# Patient Record
Sex: Male | Born: 1935 | Race: White | Hispanic: No | Marital: Married
Health system: Midwestern US, Community
[De-identification: ages and names within clinical notes are randomized; demographics above are authoritative.]

## PROBLEM LIST (undated history)

## (undated) DIAGNOSIS — I480 Paroxysmal atrial fibrillation: Secondary | ICD-10-CM

## (undated) DIAGNOSIS — E785 Hyperlipidemia, unspecified: Secondary | ICD-10-CM

## (undated) DIAGNOSIS — T148XXA Other injury of unspecified body region, initial encounter: Secondary | ICD-10-CM

## (undated) DIAGNOSIS — I270 Primary pulmonary hypertension: Secondary | ICD-10-CM

## (undated) DIAGNOSIS — M549 Dorsalgia, unspecified: Secondary | ICD-10-CM

## (undated) DIAGNOSIS — I10 Essential (primary) hypertension: Secondary | ICD-10-CM

## (undated) DIAGNOSIS — R935 Abnormal findings on diagnostic imaging of other abdominal regions, including retroperitoneum: Secondary | ICD-10-CM

## (undated) DIAGNOSIS — N401 Enlarged prostate with lower urinary tract symptoms: Secondary | ICD-10-CM

## (undated) DIAGNOSIS — N400 Enlarged prostate without lower urinary tract symptoms: Secondary | ICD-10-CM

## (undated) DIAGNOSIS — R0602 Shortness of breath: Secondary | ICD-10-CM

## (undated) DIAGNOSIS — I4891 Unspecified atrial fibrillation: Secondary | ICD-10-CM

## (undated) HISTORY — PX: TONSILLECTOMY AND ADENOIDECTOMY: SUR1326

## (undated) HISTORY — DX: Essential (primary) hypertension: I10

## (undated) HISTORY — PX: KNEE SURGERY: SHX244

## (undated) HISTORY — DX: Unspecified atrial fibrillation: I48.91

## (undated) HISTORY — PX: UMBILICAL HERNIA REPAIR: SHX196

---

## 2007-07-07 ENCOUNTER — Ambulatory Visit

## 2007-07-07 LAB — METABOLIC PANEL, COMPREHENSIVE
A-G Ratio: 1.4 (ref 1.1–2.2)
ALT (SGPT): 33 U/L (ref 30–65)
AST (SGOT): 26 U/L (ref 15–37)
Albumin: 3.8 g/dL (ref 3.5–5.0)
Alk. phosphatase: 127 U/L (ref 50–136)
Anion gap: 9 (ref 5–15)
BUN/Creatinine ratio: 15 (ref 12–20)
BUN: 16 MG/DL (ref 6–20)
Bilirubin, total: 0.5 MG/DL (ref 0–1.0)
CO2: 30 MMOL/L (ref 21–32)
Calcium: 8.8 MG/DL (ref 8.5–10.1)
Chloride: 103 MMOL/L (ref 97–108)
Creatinine: 1.1 MG/DL (ref 0.6–1.3)
GFR est AA: >60 mL/min/{1.73_m2} (ref >60)
GFR est non-AA: >60 mL/min/{1.73_m2} (ref >60)
Globulin: 2.8 g/dL (ref 2.0–4.0)
Glucose: 83 MG/DL (ref 75–110)
Potassium: 4.4 MMOL/L (ref 3.5–5.1)
Protein, total: 6.6 g/dL (ref 6.4–8.2)
Sodium: 142 MMOL/L (ref 136–145)

## 2007-07-07 LAB — URINALYSIS W/O MICRO
Bilirubin: NEGATIVE
Blood: NEGATIVE
Glucose: NEGATIVE MG/DL
Ketone: NEGATIVE MG/DL
Leukocyte Esterase: NEGATIVE
Nitrites: NEGATIVE
Protein: NEGATIVE MG/DL
Specific gravity: 1.016 (ref 1.003–1.030)
Urobilinogen: 1 EU/DL (ref 0.2–1.0)
pH (UA): 6.5 (ref 5.0–8.0)

## 2007-07-07 MED ORDER — FUROSEMIDE 20 MG TAB
20 mg | ORAL_TABLET | Freq: Every day | ORAL | Status: DC
Start: 2007-07-07 — End: 2011-10-06

## 2007-07-07 NOTE — Progress Notes (Signed)
HISTORY OF PRESENT ILLNESS  Cory Dalton is a 74 y.o. male.  Knee Injury   This is a recurrent problem. The current episode started more than 1 week ago. The problem occurs every several days. The problem has not changed since onset. The pain is present in the left knee. The pain quality is described as intermittent. The pain is at a severity of 4/10. The symptoms are worsened by activity. There has been no history of trauma.     Edema-notes worse when he skips his lasix but often skips it due to urinary frequency-no dysuria      No recent trouble with a fib-coumadin followed by cardiology    REVIEW OF SYSTEMS  Review of Systems   Constitutional: Negative for fever, chills and weight loss.   Respiratory: Negative for cough, shortness of breath and wheezing.    Cardiovascular: Positive for leg swelling. Negative for chest pain, palpitations, orthopnea and PND.   Gastrointestinal: Negative for heartburn and nausea.   Musculoskeletal: Positive for joint pain. Negative for myalgias.   Neurological: Negative for dizziness and headaches.       PHYSICAL EXAM  Physical Exam   Nursing note and vitals reviewed.  Constitutional: He is oriented. He appears well-developed and well-nourished.   HENT:   Head: Normocephalic and atraumatic.   Neck: Normal range of motion. Neck supple. Carotid bruit is not present. No thyromegaly present.   Cardiovascular: Normal rate, regular rhythm, normal heart sounds and intact distal pulses.    Pulmonary/Chest: Effort normal and breath sounds normal. No respiratory distress. He has no wheezes. He has no rales.   Musculoskeletal: He exhibits no edema.        Left knee with medial aspect joint swelling nontender and not red   Neurological: He is alert and oriented.   Psychiatric: He has a normal mood and affect. His behavior is normal.       ASSESSMENT and PLAN  Cory Dalton was seen today for follow-up.    Diagnoses and associated orders for this visit:    - Paroxysmal atrial fibrillation   - Metabolic panel, comprehensive -- Future    - Bph  - No associated orders    - Resting tremor  - No associated orders    - Urinary frequency  - Urinalysis -- Future    - Edema  - Furosemide 20 mg tab -- take 1 Tab by mouth daily.    Decrease lasix form 40 to 20 mg qd to see if better tolerated

## 2007-07-15 NOTE — Progress Notes (Signed)
Quick Note:    .not  ______

## 2011-10-06 LAB — AMB POC PT/INR
INR POC: 2.2
Prothrombin time (POC): 25.9 s

## 2011-10-06 NOTE — Patient Instructions (Signed)
MyChart Activation    Thank you for enrolling in MyChart. Please follow the instructions below to securely access your online medical record. MyChart allows you to send messages to your doctor, view your test results, renew your prescriptions, schedule appointments, and more.    How Do I Sign Up?    1. In your internet browser, go to [email or web address].  2. Click on the First Time User? Click Here link in the Sign In box. You will see the New Member Sign Up page.  3. Enter your MyChart Access Code exactly as it appears below. You will not need to use this code after you???ve completed the sign-up process. If you do not sign up before the expiration date, you must request a new code.    MyChart Access Code: [email or web address]  Expires: 01/04/2012  1:50 PM     4. Enter the last four digits of your Social Security Number (xxxx) and Date of Birth (mm/dd/yyyy) as indicated and click Submit. You will be taken to the next sign-up page.  5. Create a MyChart ID. This will be your MyChart login ID and cannot be changed, so think of one that is secure and easy to remember.  6. Create a MyChart password. You can change your password at any time.  7. Enter your Password Reset Question and Answer. This can be used at a later time if you forget your password.   8. Enter your e-mail address. You will receive e-mail notification when new information is available in MyChart.  9. Click Sign Up. You can now view your medical record.    Additional Information    If you have questions, please call [phone].  Remember, MyChart is NOT to be used for urgent needs. For medical emergencies, dial 911.

## 2011-10-06 NOTE — Progress Notes (Signed)
CARDIOLOGY OFFICE NOTE    Analyse Angst A. Apoorva Bugay, MD, [facility]    [address], [address], [place], [place] [address]  Phone [phone]; Fax [phone]  Mobile [phone]   Voice Mail [phone]    No admission date for patient encounter.  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 male I am seeing for hypertension, hyperlipidemia and Atrial Fibrillation. Symptoms include: chest pain  Cardiac risk factors: male gender, hypertension, dyslipidemia..I have personally obtained the history from the patient.I have no sen Cory Dalton since 2009. [place] apparently [place] and [place] now back. He appears to be in chronic atrial fibrillation now. He is off the amiodarone. He is doing well without chest pain or shortness of breath.he will feel SOB at times waking up stairs.       No Known Allergies      Past Medical History   Diagnosis Date   ??? Paroxysmal atrial fibrillation 07/04/2007   ??? BPH 07/04/2007   ??? Resting tremor 07/04/2007   ??? BCC (basal cell carcinoma of skin) 07/04/2007   ??? Atrial fibrillation         Past Surgical History   Procedure Date   ??? Hx hernia repair 04/2010   ??? Hx cataract removal        Home Medications:  Current Outpatient Prescriptions   Medication Sig   ??? Hydrochlorothiazide 12.5 mg tablet Take 12.5 mg by mouth daily.   ??? lovastatin (MEVACOR) 20 mg tablet Take 20 mg by mouth nightly.   ??? lisinopril (PRINIVIL, ZESTRIL) 40 mg tablet Take 40 mg by mouth daily.   ??? metoprolol-XL (TOPROL XL) 50 mg XL tablet Take  by mouth daily.   ??? amLODIPine (NORVASC) 5 mg tablet Take 5 mg by mouth daily.   ??? FLOMAX 0.4 mg Cp24 take 0.4 mg by mouth daily.   ??? WARFARIN PO take  by mouth. 1.25 mg 5 days 2.5 mg 2 days    ??? ASPIRIN 81 mg Tab take  by mouth.   ??? DIGOXIN 0.125 mg tablet take 125 mcg by mouth daily.   ??? AVODART 0.5 mg Cap take 0.5 mg by mouth daily.             Objective:   Laboratory and Imaging have been  reviewed by me and are notable for    ECG:(10/06/11) atrial fibrillation, rate 68        Cardiac work up to date:            PROCEDURES                                           DATE       1.  Echocardiogram                 (04/13/07)  EF 55-60%, BAE, Mild TR with       2. Cholesterol profile                                    (pending)  Cholesterol =        HDL  =        LDL  =        Triglycerides  =         Social History:  History   Substance Use Topics   ??? Smoking status: Never Smoker    ??? Smokeless tobacco: Never Used   ??? Alcohol Use: 1.5 oz/week     3 Glasses of wine per week      3 er week       Family History:  Family History   Problem Relation Age of Onset   ??? Hypertension Mother    ??? Cancer Father      ?prostate cancer, bone cancer       Review of Symptoms:  A comprehensive review of systems ,particularly related to cardiovascular system, was negative except for that written in the HPI.    Physical Exam:      BP 130/80   Pulse 60   Ht 5\' 11"  (1.803 m)   Wt 191 lb (86.637 kg)   BMI 26.64 kg/m2   SpO2 98%  General Appearance:  Well developed, well nourished,alert and oriented x 3, and individual in no acute distress.   Ears/Nose/Mouth/Throat:   Hearing grossly normal.     Neck: Supple no JVD or bruits   Chest:   Lungs clear to auscultation bilaterally.   Cardiovascular:  Irregular rate and rhythm   Abdomen:      Extremities: 1+ ankle edema bilaterally.    Skin: Warm and dry.       Medication change on this office visit: none    Assessment:   ?? Atrial fibirrlation  ?? Edema possibly related to norvasc he was started on  ?? dyslipidemia   PLAN/discussion:   ?? He has been doing well with his afib. We talked about alternative agents for anticoagulation and he wishes to do some research. He will have his FLP done.we will now be following his INR.   ?? I have extensively reviewed all testing with the patient.    I have discussed the diagnosis with the patient and the  intended plan as seen in the above orders.  Questions were answered concerning future plans.  I have discussed medication side effects and warnings with the patient as well.    Cory Dalton is in agreement to the plan listed above and wishes to proceed.     he  was instructed not to smoke, eat heart healthy diet  and to exercise.     Thank you for this consult.    Rushie Chestnut, MD

## 2011-10-06 NOTE — Progress Notes (Signed)
See inr results

## 2011-10-12 MED ORDER — WARFARIN 2.5 MG TAB
2.5 mg | ORAL_TABLET | Freq: Every day | ORAL | Status: DC
Start: 2011-10-12 — End: 2012-11-26

## 2011-10-12 MED ORDER — AMLODIPINE 5 MG TAB
5 mg | ORAL_TABLET | Freq: Every day | ORAL | Status: DC
Start: 2011-10-12 — End: 2011-12-06

## 2011-10-12 MED ORDER — TAMSULOSIN SR 0.4 MG 24 HR CAP
0.4 mg | ORAL_CAPSULE | Freq: Every day | ORAL | Status: DC
Start: 2011-10-12 — End: 2012-10-09

## 2011-10-12 NOTE — Patient Instructions (Signed)
Exercise for 30 minutes daily     Go to www.EATRIGHT.org for information on healthy eating    Consider seeing Nutritionist at [facility]  #[phone] or #[phone]    I suggest a complete physical exam yearly during your birth month to update health maintenance issues such as fasting labs, EKGs and other studies, immunizations, etc.

## 2011-10-12 NOTE — Progress Notes (Signed)
Patient present to establish care.  Reviewed record in preparation for visit and have obtained necessary documentation.  Chief Complaint   Patient presents with   ??? Establish Care   ??? Hypertension     medication refill

## 2011-10-12 NOTE — Progress Notes (Signed)
Cory Dalton is a 78 y.o.  male  Issues discussed today include:    New patient.  Needs refills.    just moved back to [place] from [facility]    Data reviewed or ordered today:  Declined tdap today    Other problems include:  Patient Active Problem List   Diagnoses Code   ??? Paroxysmal atrial fibrillation 427.31   ??? BCC (basal cell carcinoma of skin) 173.91   ??? Resting tremor 781.0   ??? BPH (benign prostatic hyperplasia) 600.90   ??? HTN (hypertension) 401.9   ??? S/P colonoscopy V45.89   ??? H/O knee surgery V45.89   ??? Hernia 553.9       Medications:  Current Outpatient Prescriptions   Medication Sig Dispense Refill   ??? warfarin (COUMADIN) 2.5 mg tablet Take 1 Tab by mouth daily. Or as directed (generally takes one 2.5 mg pill daily except half pill on Wednesdays)  90 Tab  3   ??? tamsulosin (FLOMAX) 0.4 mg capsule Take 1 Cap by mouth daily.  90 Cap  3   ??? amLODIPine (NORVASC) 5 mg tablet Take 1 Tab by mouth daily.  90 Tab  3   ??? Hydrochlorothiazide 12.5 mg tablet Take 12.5 mg by mouth daily.       ??? lovastatin (MEVACOR) 20 mg tablet Take 20 mg by mouth nightly.       ??? lisinopril (PRINIVIL, ZESTRIL) 40 mg tablet Take 40 mg by mouth daily.       ??? metoprolol-XL (TOPROL XL) 50 mg XL tablet Take  by mouth daily.       ??? ASPIRIN 81 mg Tab take  by mouth.       ??? DIGOXIN 0.125 mg tablet take 125 mcg by mouth daily.       ??? AVODART 0.5 mg Cap take 0.5 mg by mouth daily.           Allergies:  No Known Allergies    LMP:  No LMP for male patient.    History     Social History   ??? Marital Status: MARRIED     Spouse Name: N/A     Number of Children: N/A   ??? Years of Education: N/A     Occupational History   ??? Not on file.     Social History Main Topics   ??? Smoking status: Never Smoker    ??? Smokeless tobacco: Never Used   ??? Alcohol Use: 1.5 oz/week     3 Glasses of wine per week      3 er week   ??? Drug Use: No   ??? Sexually Active:      Other Topics Concern   ??? Not on file     Social History Narrative   ??? No narrative on file        Family History   Problem Relation Age of Onset   ??? Hypertension Mother    ??? Cancer Father      ?prostate cancer, bone cancer       Meaningful use:  done      ROS:  Headaches:  no  Chest Pain:  no  SOB:  no  Fevers:  no  Snoring:  yes  Sleep problems:  denies OSA  Cough:  no  Fatigue:  no  Anxiety:  no  Depression:  no    Other significant ROS:  BP was fine last week    No LMP for male patient.  Physical Exam  BP 160/108   Pulse 55   Temp(Src) 97.3 ??F (36.3 ??C) (Oral)   Resp 16   Ht 5\' 11"  (1.803 m)   Wt 190 lb (86.183 kg)   BMI 26.50 kg/m2  BP Readings from Last 3 Encounters:   10/12/11 160/108   10/06/11 130/80   07/07/07 122/74     Constitutional:  Appears well,  No Acute Distress, Vitals noted  Psychiatric:   Affect normal, Alert and Oriented to person/place/time    Eyes:   Pupils equally round and reactive, EOMI, conjunctiva clear, eyelids normal+1   ENT:   External ears and nose normal/lips, teeth=OK/gums normal, TMs and Orophyarynx normal  Neck:   general inspection and Thyroid normal.  No abnormal cervical or supraclavicular nodes    Lungs:   clear to auscultation, good respiratory effort  Heart:   Ausculation normal.  Regular rhythm.  No cardiac murmurs.  No carotid bruits or palpable thrills  Chest wall normal    Extremities:   +1 edema, good peripheral pulses  Skin:   Warm to palpation, without rashes, bruising, or suspicious lesions         Assessment:    Patient Active Problem List   Diagnoses Code   ??? Paroxysmal atrial fibrillation 427.31   ??? BCC (basal cell carcinoma of skin) 173.91   ??? Resting tremor 781.0   ??? BPH (benign prostatic hyperplasia) 600.90   ??? HTN (hypertension) 401.9   ??? S/P colonoscopy V45.89   ??? H/O knee surgery V45.89   ??? Hernia 553.9       Today's diagnoses are:  1. BPH (benign prostatic hyperplasia)  tamsulosin (FLOMAX) 0.4 mg capsule   2. Paroxysmal atrial fibrillation  warfarin (COUMADIN) 2.5 mg tablet   3. HTN (hypertension)  amLODIPine (NORVASC) 5 mg tablet   4. BCC (basal  cell carcinoma of skin)  REFERRAL TO DERMATOLOGY       Plan:  Orders Placed This Encounter   ??? REFERRAL TO DERMATOLOGY     Referral Priority:  Routine     Referral Type:  Consultation     Referral Reason:  Specialty Services Required     Requested Specialty:  Dermatology     Number of Visits Requested:  1   ??? warfarin (COUMADIN) 2.5 mg tablet     Sig: Take 1 Tab by mouth daily. Or as directed (generally takes one 2.5 mg pill daily except half pill on Wednesdays)     Dispense:  90 Tab     Refill:  3   ??? tamsulosin (FLOMAX) 0.4 mg capsule     Sig: Take 1 Cap by mouth daily.     Dispense:  90 Cap     Refill:  3   ??? amLODIPine (NORVASC) 5 mg tablet     Sig: Take 1 Tab by mouth daily.     Dispense:  90 Tab     Refill:  3       See patient instructions  There are no Patient Instructions on file for this visit.    MYCHART:  info given    refresh note:  done    AVS Printed:  done      The majority of today's visit was counseling or coordination of care 20 minutes.     Total face to face time:  20  Time spent counseling:  15  Subject:  See diagnoses.

## 2011-10-14 LAB — LIPID PANEL
Cholesterol, total: 146 mg/dL (ref 100–199)
HDL Cholesterol: 69 mg/dL (ref >39)
LDL, calculated: 68 mg/dL (ref 0–99)
Triglyceride: 47 mg/dL (ref 0–149)
VLDL, calculated: 9 mg/dL (ref 5–40)

## 2011-10-14 LAB — HEPATIC FUNCTION PANEL
ALT (SGPT): 10 IU/L (ref 0–44)
AST (SGOT): 18 IU/L (ref 0–40)
Albumin: 3.8 g/dL (ref 3.5–4.7)
Alk. phosphatase: 81 IU/L (ref 25–160)
Bilirubin, direct: 0.17 mg/dL (ref 0.00–0.40)
Bilirubin, total: 0.5 mg/dL (ref 0.0–1.2)
Protein, total: 6 g/dL (ref 6.0–8.5)

## 2011-11-04 LAB — AMB POC PT/INR
INR POC: 1.8
Prothrombin time (POC): 21 s

## 2011-11-04 NOTE — Patient Instructions (Addendum)
Take coumadin 2.5 mg daily and recheck in one week

## 2011-11-04 NOTE — Progress Notes (Signed)
Cory Dalton is a 78 y.o.  male  Issues discussed today include:    Follow up of chronic conditions, see diagnoses      Data reviewed or ordered today:  INR today 1.8    Other problems include:  Patient Active Problem List   Diagnoses Code   ??? Paroxysmal atrial fibrillation 427.31   ??? BCC (basal cell carcinoma of skin) 173.91   ??? Resting tremor 781.0   ??? BPH (benign prostatic hyperplasia) 600.90   ??? HTN (hypertension) 401.9   ??? S/P colonoscopy V45.89   ??? H/O knee surgery V45.89   ??? Hernia 553.9       Medications:  Current Outpatient Prescriptions   Medication Sig Dispense Refill   ??? warfarin (COUMADIN) 2.5 mg tablet Take 1 Tab by mouth daily. Or as directed (generally takes one 2.5 mg pill daily except half pill on Wednesdays)  90 Tab  3   ??? tamsulosin (FLOMAX) 0.4 mg capsule Take 1 Cap by mouth daily.  90 Cap  3   ??? amLODIPine (NORVASC) 5 mg tablet Take 1 Tab by mouth daily.  90 Tab  3   ??? Hydrochlorothiazide 12.5 mg tablet Take 12.5 mg by mouth daily.       ??? lovastatin (MEVACOR) 20 mg tablet Take 20 mg by mouth nightly.       ??? lisinopril (PRINIVIL, ZESTRIL) 40 mg tablet Take 40 mg by mouth daily.       ??? metoprolol-XL (TOPROL XL) 50 mg XL tablet Take  by mouth daily.       ??? ASPIRIN 81 mg Tab take  by mouth.       ??? DIGOXIN 0.125 mg tablet take 125 mcg by mouth daily.       ??? AVODART 0.5 mg Cap take 0.5 mg by mouth daily.           Allergies:  No Known Allergies    LMP:  No LMP for male patient.    History     Social History   ??? Marital Status: MARRIED     Spouse Name: N/A     Number of Children: N/A   ??? Years of Education: N/A     Occupational History   ??? Not on file.     Social History Main Topics   ??? Smoking status: Never Smoker    ??? Smokeless tobacco: Never Used   ??? Alcohol Use: 1.5 oz/week     3 Glasses of wine per week      3 er week   ??? Drug Use: No   ??? Sexually Active:      Other Topics Concern   ??? Not on file     Social History Narrative   ??? No narrative on file       Family History   Problem Relation  Age of Onset   ??? Hypertension Mother    ??? Cancer Father      ?prostate cancer, bone cancer       Meaningful use:  done      Physical Exam:  BP 146/87   Pulse 64   Temp(Src) 97.6 ??F (36.4 ??C) (Oral)   Resp 18   Ht 5\' 11"  (1.803 m)   Wt 188 lb 6.4 oz (85.458 kg)   BMI 26.28 kg/m2  BP Readings from Last 3 Encounters:   11/04/11 146/87   10/12/11 160/108   10/06/11 130/80         Assessment:  Patient Active Problem List   Diagnoses Code   ??? Paroxysmal atrial fibrillation 427.31   ??? BCC (basal cell carcinoma of skin) 173.91   ??? Resting tremor 781.0   ??? BPH (benign prostatic hyperplasia) 600.90   ??? HTN (hypertension) 401.9   ??? S/P colonoscopy V45.89   ??? H/O knee surgery V45.89   ??? Hernia 553.9       Today's diagnoses are:  1. Atrial fibrillation  AMB POC PT/INR, COLLECTION CAPILLARY BLOOD SPECIMEN   2. Encounter for long-term (current) use of anticoagulants  AMB POC PT/INR, COLLECTION CAPILLARY BLOOD SPECIMEN       Plan:  Orders Placed This Encounter   ??? COLLECTION CAPILLARY BLOOD SPECIMEN   ??? AMB POC PT/INR       See patient instructions  There are no Patient Instructions on file for this visit.    MYCHART:  info given    refresh note:  done    AVS Printed:  done      The majority of today's visit was counseling or coordination of care 15 minutes.     Total face to face time:  15  Time spent counseling:  12  Subject:  See diagnoses.

## 2011-11-04 NOTE — Progress Notes (Signed)
Patient present for follow up on hypertension and coumadin.  Reviewed record in preparation for visit and have obtained necessary documentation.

## 2011-11-04 NOTE — Progress Notes (Signed)
Appointment changed to Dr Jackson

## 2011-11-12 LAB — AMB POC PT/INR
INR POC: 2.3
Prothrombin time (POC): 28 s

## 2011-11-26 LAB — AMB POC PT/INR
INR POC: 3.4
Prothrombin time (POC): 41.3 s

## 2011-11-26 LAB — AMB POC HEMOGLOBIN (HGB): Hemoglobin (POC): 12.4

## 2011-11-26 NOTE — Progress Notes (Signed)
HISTORY OF PRESENT ILLNESS  Cory Dalton is a 78 y.o. male.  HPI Comments: S/p skin lesion removed by dermatologist. The wound was stitched and had been doing well until 2 days ago when it started to bleed during a dressing change and hadn't stopped since. He also reports and episode of dizziness and lightheadedness 6 days ago. He did not faint or fall but he checked his BP at home and found it to be ~70 systolic. He stopped taking his Norvasc after the dizzy spell.  He has since been following his BP closely and notes that for the last week it has been about 105 systolic. This is very different from what he is used to which is ~135 systolic. Reports no abdominal pain or dark stool. Notes that since the dizzy spell he has not had much of an appetite and has not been eating as he usually does.    He plans to go to [place] for 1 week starting tomorrow. He feels physically capable of making the journey.    Wound Check  The history is provided by the patient. Pertinent negatives include no headaches.   Dizziness  Pertinent negatives include no headaches.   Bleeding/Bruising  Pertinent negatives include no headaches.       Review of Systems   Constitutional: Negative for fever, chills and weight loss.   HENT: Negative for nosebleeds.    Respiratory: Negative for cough.    Skin: Negative for rash.   Neurological: Positive for dizziness. Negative for headaches.       Physical Exam   Constitutional: He is oriented to person, place, and time. He appears well-developed and well-nourished.   HENT:   Head: Normocephalic.            Wound is well sutured, small amount of blood oozing from middle of wound.  Under original dressing there was a clot, but no clot forming over the leak.   Eyes:        Rt eye has iris tear 2' lens replacement   Cardiovascular: Normal rate and regular rhythm.    Pulmonary/Chest: Effort normal and breath sounds normal.   Abdominal: Soft. Bowel sounds are normal. He exhibits no distension. There is no  tenderness.   Genitourinary: Guaiac negative stool.   Neurological: He is alert and oriented to person, place, and time.     Recent Results (from the past 12 hour(s))   AMB POC PT/INR    Collection Time    11/26/11 10:30 AM       Component Value Range    VALID INTERNAL CONTROL POC Yes      PT POC 41.3      INR POC 3.4     AMB POC HEMOGLOBIN (HGB)    Collection Time    11/26/11 10:30 AM       Component Value Range    Hemoglobin (POC) 12.4         ASSESSMENT and PLAN  1. Bleeding from wound  AMB POC PT/INR, AMB POC HEMOGLOBIN (HGB), CBC WITH AUTOMATED DIFF     Light bleeding has been continuous for 2 days. Explained by elevated INR of 3.4  Attempted to stop bleeding with silver nitrate, unable to see source of bleeding and unable to create a strong clot over wound. Instruct wife on how to use a gloved hand to apply pressure without tearing away clot.      Dizziness and low BP: Initial concern for GI bleed in presence of elevated  INR. Rectal: guaiac negative.    Obtained POC HGB in addition to a CBC with Diff.  HGB of 12.4 is reassuring.    Sent pt home with a home guaiac kit    Pt has not taken Norvasc this week. Advised to stay off it and monitor his BP    INR   Pt is usually well controlled. He has not been eating well recently, this may have caused the increase in INR.     Didn't take coumadin last night, advised to not take tonight and resume his schedule tomorrow.   Return for INR check- future order is in   Return for 1 week follow up    With his trip to [place] he will be gone for 1 week starting tomorrow. Advised pt of serious possibility of a GI bleed. Advised him to monitor his symptoms and seek medical attention if bleeding or dizziness returns or worsens.

## 2011-11-26 NOTE — Progress Notes (Signed)
Chief Complaint   Patient presents with   ??? Wound Check     Reviewed record in preparation for visit and have obtained necessary documentation.

## 2011-11-27 LAB — CBC WITH AUTOMATED DIFF
ABS. BASOPHILS: 0 10*3/uL (ref 0.0–0.2)
ABS. EOSINOPHILS: 0.1 10*3/uL (ref 0.0–0.4)
ABS. IMM. GRANS.: 0 10*3/uL (ref 0.0–0.1)
ABS. MONOCYTES: 0.4 10*3/uL (ref 0.1–1.0)
ABS. NEUTROPHILS: 3.1 10*3/uL (ref 1.8–7.8)
Abs Lymphocytes: 2.6 10*3/uL (ref 0.7–4.5)
BASOPHILS: 1 % (ref 0–3)
EOSINOPHILS: 1 % (ref 0–7)
HCT: 38.3 % (ref 37.5–51.0)
HGB: 13.2 g/dL (ref 12.6–17.7)
IMMATURE GRANULOCYTES: 0 % (ref 0–2)
Lymphocytes: 42 % (ref 14–46)
MCH: 32.4 pg (ref 26.6–33.0)
MCHC: 34.5 g/dL (ref 31.5–35.7)
MCV: 94 fL (ref 79–97)
MONOCYTES: 7 % (ref 4–13)
NEUTROPHILS: 49 % (ref 40–74)
PLATELET: 190 10*3/uL (ref 140–415)
RBC: 4.07 x10E6/uL — ABNORMAL LOW (ref 4.14–5.80)
RDW: 14.1 % (ref 12.3–15.4)
WBC: 6.2 10*3/uL (ref 4.0–10.5)

## 2011-11-29 NOTE — Progress Notes (Signed)
I saw and evaluated the patient, performing the key elements of the service.  I discussed the findings, assessment and plan with the resident and agree with the resident's findings and plan as documented in the resident's note.

## 2011-12-06 LAB — AMB POC FECAL BLOOD, OCCULT, QL 3 CARDS
Hemoccult (POC): NEGATIVE
Occult Blood-2 (POC): NEGATIVE
Occult blood-3 (POC): NEGATIVE

## 2011-12-06 LAB — AMB POC PT/INR
INR POC: 2.2
Prothrombin time (POC): 26.4 s

## 2011-12-06 NOTE — Progress Notes (Signed)
I saw and evaluated the patient, performing the key elements of the service.  I discussed the findings, assessment and plan with the resident and agree with the resident's findings and plan as documented in the resident's note.

## 2011-12-06 NOTE — Progress Notes (Signed)
Cory Dalton is a 78 y.o. male who presents for f/u for dizziness and elevated INR of 3.4 1 week ago and low BP.  He has been collecting home stool for hemoccult monitoring. He has been on vacation for the past week. Has had no complaints today. He has felt well for past week.  Appetite has returned. No dizziness. Taking home BP, running in 100s/80s He is taking his coumadin as directed, still not taking Norvasc. All other meds as directed    PMHx:  Past Medical History   Diagnosis Date   ??? Paroxysmal atrial fibrillation 07/04/2007   ??? BPH 07/04/2007   ??? Resting tremor 07/04/2007   ??? BCC (basal cell carcinoma of skin) 07/04/2007   ??? Atrial fibrillation        Meds:   Current Outpatient Prescriptions   Medication Sig Dispense Refill   ??? warfarin (COUMADIN) 2.5 mg tablet Take 1 Tab by mouth daily. Or as directed (generally takes one 2.5 mg pill daily except half pill on Wednesdays)  90 Tab  3   ??? tamsulosin (FLOMAX) 0.4 mg capsule Take 1 Cap by mouth daily.  90 Cap  3   ??? Hydrochlorothiazide 12.5 mg tablet Take 12.5 mg by mouth daily.       ??? lovastatin (MEVACOR) 20 mg tablet Take 20 mg by mouth nightly.       ??? lisinopril (PRINIVIL, ZESTRIL) 40 mg tablet Take 40 mg by mouth daily.       ??? metoprolol-XL (TOPROL XL) 50 mg XL tablet Take  by mouth daily.       ??? ASPIRIN 81 mg Tab take  by mouth.       ??? DIGOXIN 0.125 mg tablet take 125 mcg by mouth daily.       ??? AVODART 0.5 mg Cap take 0.5 mg by mouth daily.       ??? amLODIPine (NORVASC) 5 mg tablet Take 1 Tab by mouth daily.  90 Tab  3       Allergies:   No Known Allergies    Smoker:  History   Smoking status   ??? Never Smoker    Smokeless tobacco   ??? Never Used       ETOH:   History   Alcohol Use   ??? 1.5 oz/week   ??? 3 Glasses of wine per week     3 er week       FH:   Family History   Problem Relation Age of Onset   ??? Hypertension Mother    ??? Cancer Father      ?prostate cancer, bone cancer       ROS:  General/Constitutional:   No headache, fever, fatigue, weight loss or  weight gain       Eyes:   No redness, pruritis, pain, visual changes, swelling, or discharge      Ears:    No pain, loss or changes in hearing     Neck:   No swelling, masses, stiffness, pain, or limited movement     Cardiac:    No chest pain      Respiratory:   No cough or shortness of breath     GI:   No nausea/vomiting, diarrhea, abdominal pain, bloody or dark stools       GU:   No dysuria or  hematuria    Neurological:   No loss of consciousness, dizziness, seizures, dysarthria, cognitive changes, memory changes,  problems with balance, or unilateral  weakness   Skin: No rash     Physical Exam:  BP 103/68   Pulse 60   Temp(Src) 97.8 ??F (36.6 ??C) (Oral)   Resp 18   Ht 5\' 11"  (1.803 m)   Wt 182 lb (82.555 kg)   BMI 25.38 kg/m2   SpO2 98%  GEN: No apparent distress. Alert and oriented and responds to all questions appropriately.  EYES:  Conjunctiva clear; pupils round and reactive to light; extraocular movements are intact.   EAR: External ears are normal.  Tympanic membranes are clear and without effusion.  NOSE: Turbinates are within normal limits.  No drainage  OROPHYARYNX: No oral lesions or exudates.  NECK:  Supple; no masses; thyroid normal           LUNGS: Respirations unlabored; clear to auscultation bilaterally  CARDIOVASCULAR: Regular, rate, and rhythm without murmurs, gallops or rubs   ABDOMEN: Soft; nontender; nondistended; normoactive bowel sounds; no masses or organomegaly  NEUROLOGIC:  No focal neurologic deficits. Strength and sensation grossly intact.  Coordination and gait grossly intact.   EXT: Well perfused. No edema.  SKIN: No obvious rashes.    Assessment:  1. A-fib  AMB POC PT/INR   2. Screening for malignant neoplasm of the rectum  AMB POC FECAL BLOOD, OCCULT, QL 3 CARDS       Plan:  Suspect dizzy spell last week due to over medication vs vasovagal (he had nausea)  Monitor BP, pt will take home BP. Advised to be aware of dizziness or light headedness   Continue to hold Norvasc   We may adjust  BP meds if BP remains low     INR 2.2 continue warfarin as usual, return in 1 mo for coumadin check   Suspect INR increase 2' wasn't eating due to nausea, may have had a low grade illness  Home Hemoccult was negative, this was to r/o GI bleed from high INR    Will see him in September for a physical

## 2011-12-06 NOTE — Progress Notes (Signed)
Chief Complaint   Patient presents with   ??? Follow-up     Reviewed record in preparation for visit and have obtained necessary documentation.

## 2011-12-13 NOTE — Telephone Encounter (Signed)
Patient is requesting refill on medication.

## 2011-12-14 MED ORDER — LOVASTATIN 20 MG TAB
20 mg | ORAL_TABLET | Freq: Every evening | ORAL | Status: DC
Start: 2011-12-14 — End: 2012-06-26

## 2012-01-03 LAB — AMB POC PT/INR
INR POC: 2
Prothrombin time (POC): 23.8 s

## 2012-01-03 NOTE — Progress Notes (Signed)
2.5 mg daily and 5 mg one day a week, recheck in 2 weeks.

## 2012-01-18 LAB — AMB POC PT/INR
INR POC: 3
Prothrombin time (POC): 35.6 s

## 2012-02-01 LAB — AMB POC PT/INR
INR POC: 2.4
Prothrombin time (POC): 28.6 s

## 2012-02-01 NOTE — Addendum Note (Signed)
Addended by: Corine Shelter on: 02/01/2012 09:10 AM     Modules accepted: Orders

## 2012-02-01 NOTE — Progress Notes (Signed)
PT/INR  PATIENT TAKES 2.5MG  DAILY   PT 28.6  INR 2.4  CONTINUE 2.5MG  DAILY RECHECK IN 4 WEEKS.  Reviewed record in preparation for visit and have obtained necessary documentation.

## 2012-03-01 LAB — AMB POC PT/INR
INR POC: 3.2
Prothrombin time (POC): 40.4 s

## 2012-03-01 NOTE — Progress Notes (Signed)
HPI  Cory Dalton is a 78 y.o. male who presents for an annual well visit.  He has no complaints other than a gradual hearing loss that he thinks it is time to get checked and a little shortness of breath at the end of a long walk.      He has a long hx of various skin lesions/cancers. (has seen a dermatologist for 20+ years, seen last week.) Will be having a basal cell lesion removed from his left submandibular area next week    Denies heat/cold intolerance, CP, palpitations, dyspnea, abdominal pain, joint pain, urinary hesitancy or urgency. He does have occasional constipation but is not bothered by it.    PMHx:  Past Medical History   Diagnosis Date   ??? Paroxysmal atrial fibrillation 07/04/2007   ??? BPH 07/04/2007   ??? Resting tremor 07/04/2007   ??? BCC (basal cell carcinoma of skin) 07/04/2007   ??? Atrial fibrillation    ??? Skin cancer        Meds:   Current Outpatient Prescriptions   Medication Sig Dispense Refill   ??? omega-3 fatty acids-vitamin e (FISH OIL) 1,000 mg cap Take 1 Cap by mouth.       ??? amLODIPine (NORVASC) 5 mg tablet Take 5 mg by mouth daily.       ??? lovastatin (MEVACOR) 20 mg tablet Take 1 Tab by mouth nightly.  90 Tab  1   ??? warfarin (COUMADIN) 2.5 mg tablet Take 1 Tab by mouth daily. Or as directed (generally takes one 2.5 mg pill daily except half pill on Wednesdays)  90 Tab  3   ??? tamsulosin (FLOMAX) 0.4 mg capsule Take 1 Cap by mouth daily.  90 Cap  3   ??? Hydrochlorothiazide 12.5 mg tablet Take 12.5 mg by mouth daily.       ??? metoprolol-XL (TOPROL XL) 50 mg XL tablet Take 25 mg by mouth two (2) times a day.       ??? ASPIRIN 81 mg Tab take  by mouth.       ??? DIGOXIN 0.125 mg tablet take 125 mcg by mouth daily.       ??? AVODART 0.5 mg Cap take 0.5 mg by mouth daily.           Allergies:   No Known Allergies    Smoker:  History   Smoking status   ??? Never Smoker    Smokeless tobacco   ??? Never Used       ETOH:   History   Alcohol Use   ??? 1.5 oz/week   ??? 3 Glasses of wine per week     3 er week       FH:    Family History   Problem Relation Age of Onset   ??? Hypertension Mother    ??? Cancer Father      ?prostate cancer, bone cancer       ROS:  General/Constitutional:   No headache, fever, fatigue, weight loss or weight gain       Eyes:   No redness, pruritis, pain, visual changes, swelling, or discharge      Ears:    No pain, gradual hearing loss, but not dramatic    Neck:   No swelling, masses, stiffness, pain, or limited movement     Cardiac:    No chest pain      Respiratory:   No cough or shortness of breath     GI:  No nausea/vomiting, diarrhea, abdominal pain, bloody or dark stools       GU:   No dysuria or  hematuria    Neurological:   No loss of consciousness, dizziness, seizures, dysarthria, cognitive changes, memory changes,  problems with balance, or unilateral weakness   Skin: basal cell lesion on neck, no other lesions      Physical Exam:  BP 145/84   Pulse 56   Ht 5\' 11"  (1.803 m)   Wt 184 lb 12.8 oz (83.825 kg)   BMI 25.77 kg/m2  GEN: No apparent distress. Alert and oriented and responds to all questions appropriately.   EYES:  Conjunctiva clear; pupils round and reactive to light; extraocular movements are intact. S/p lens replacement   EAR: External ears are normal.  Tympanic membranes are clear and without effusion. Some cerumen build up. Hearing intact to voice and soft  NOSE: Turbinates are within normal limits.  No drainage  OROPHYARYNX: No oral lesions or exudates.  NECK:  Supple; no masses; thyroid normal           LUNGS: Respirations unlabored; clear to auscultation bilaterally  CARDIOVASCULAR: Regular, rate, and rhythm without murmurs, gallops or rubs   ABDOMEN: Soft; nontender; nondistended; normoactive bowel sounds; no masses or organomegaly. S/p umbilical hernia repair  GU: no lesion or hernia. Testes small, smooth, no hydrocele  NEUROLOGIC:  No focal neurologic deficits. Strength and sensation grossly intact.  Coordination and gait grossly intact.   EXT: Well perfused. No edema.  SKIN: No  obvious rashes. Known basal lesion left side submandibular        Assessment and Plan     Doing well, no complaints  Basal cell lesion to be removed by Derm next week   Had bleeding problem last derm procedure.  He may be asked to stop his coumadin by derm.  Will see [place] after 5 days back on coumadin if this is the case  INR 3.2 this visit. He will hold today's dose of coumadin and resume tomorrow  BP noted, SBP of 140's acceptable, he has had recent orthostatic hypotension, will not modify his HTN regime due to this risk.  Will refer to audiology  Labs - will mail results    Last eye exam 11/2011    1. Well adult exam  CBC WITH AUTOMATED DIFF, METABOLIC PANEL, COMPREHENSIVE, LIPID PANEL, TSH, 3RD GENERATION   2. Hearing difficulty  REFERRAL TO AUDIOLOGY   3. Need for pneumococcal vaccine  PNEUMOCOCCAL POLYSACCHARIDE VACCINE, 23-VALENT, ADULT OR IMMUNOSUPPRESSED PT DOSE,

## 2012-03-01 NOTE — Addendum Note (Signed)
Addended by: Marciano Sequin C on: 03/01/2012 03:44 PM     Modules accepted: Orders

## 2012-03-01 NOTE — Progress Notes (Signed)
Chief Complaint   Patient presents with   ??? Complete Physical   Reviewed record in preparation for visit and have obtained necessary documentation.

## 2012-03-01 NOTE — Patient Instructions (Signed)
Learning About Physical Activity  What is physical activity?  Physical activity is any kind of activity that gets your body moving. Being active means allowing your body to "practice" breathing, stretching, and lifting. The more practice your body gets, the better it works.  The types of physical activity that can help you get fit and stay healthy include:  ?? Aerobic or "cardio" activities that make your heart beat faster and make you breathe harder, such as brisk walking, riding a bike, or running. Aerobic activities strengthen your heart and lungs and build up your endurance.  ?? Strength training activities that make your muscles work against, or "resist," something, such as lifting weights or doing push-ups. These activities help tone and strengthen your muscles.  ?? Stretches that allow you to move your joints and muscles through their full range of motion. Stretching helps you be more flexible and avoid injury.  What are the benefits of physical activity?  Being active is one of the best things you can do to get fit and stay healthy. It helps you to:  ?? Feel stronger and have more energy to do all the things you like to do.  ?? Focus better at school or work and perform better in sports.  ?? Feel, think, and sleep better.  ?? Reach and stay at a healthy weight.  ?? Lose fat and build lean muscle.  ?? Lower your risk for serious health problems.  ?? Keep your bones, muscles, and joints strong.  Being fit lets you do more physical activity. And it lets you work out harder without as much effort.  How can you make physical activity part of your life?  Get at least 30 minutes of exercise on most days of the week. Walking is a good choice. You also may want to do other activities, such as running, swimming, cycling, or playing tennis or team sports.  Pick activities that you like???ones that make your heart beat faster, your muscles stronger, and your muscles and joints more flexible. If you find more than one thing you  like doing, do them all. You don't have to do the same thing every day.  Get your heart pumping every day. Any activity that makes your heart beat faster and keeps it at that rate for a while counts.  Here are some great ways to get your heart beating faster:  ?? Go for a brisk walk, run, or bike ride.  ?? Go for a hike or swim.  ?? Go in-line skating.  ?? Play a game of touch football, basketball, or soccer.  ?? Ride a bike.  ?? Play tennis or racquetball.  ?? Climb stairs.  Even some household chores can be aerobic???just do them at a faster pace. Vacuuming, raking or mowing the lawn, sweeping the garage, and washing and waxing the car all can help get your heart rate up.  Strengthen your muscles during the week. You don't have to lift heavy weights or grow big, bulky muscles to get stronger. Doing a few simple activities that make your muscles work against, or "resist," something can help you get stronger.  For example, you can:  ?? Do push-ups or sit-ups, which use your own body weight as resistance.  ?? Lift weights or dumbbells or use stretch bands at home or in a gym or community center.  Stretch your muscles often. Stretching will help you as you become more active. It can help you stay flexible, loosen tight muscles, and avoid injury.   It can also help improve your balance and posture and can be a great way to relax.  Be sure to stretch the muscles you'll be using when you work out. It???s best to warm your muscles slightly before you stretch them. Walk or do some other light aerobic activity for a few minutes, and then start stretching.  When you stretch your muscles:  ?? Do it slowly. Stretching is not about going fast or making sudden movements.  ?? Don't push or bounce during a stretch.  ?? Hold each stretch for at least 15 to 30 seconds, if you can. You should feel a stretch in the muscle, but not pain.  ?? Breathe out as you do the stretch. Then breathe in as you hold the stretch. Don't hold your breath.  If you're  worried about how more activity might affect your health, have a checkup before you start. Follow any special advice your doctor gives you for getting a smart start.   Where can you learn more?   Go to [employer]  Enter [id number] in the search box to learn more about "Learning About Physical Activity."   ?? 2006-2013 [employer]. Care instructions adapted under license by [employer] (which disclaims liability or warranty for this information). This care instruction is for use with your licensed healthcare professional. If you have questions about a medical condition or this instruction, always ask your healthcare professional. [employer], Incorporated disclaims any warranty or liability for your use of this information.  Content Version: 9.8.193578; Last Revised: February 20, 2010

## 2012-03-02 LAB — METABOLIC PANEL, COMPREHENSIVE
A-G Ratio: 2 (ref 1.1–2.5)
ALT (SGPT): 13 IU/L (ref 0–44)
AST (SGOT): 20 IU/L (ref 0–40)
Albumin: 4.3 g/dL (ref 3.5–4.7)
Alk. phosphatase: 77 IU/L (ref 44–105)
BUN/Creatinine ratio: 19 (ref 10–22)
BUN: 14 mg/dL (ref 8–27)
Bilirubin, total: 0.6 mg/dL (ref 0.0–1.2)
CO2: 24 mmol/L (ref 19–28)
Calcium: 9.3 mg/dL (ref 8.6–10.2)
Chloride: 96 mmol/L — ABNORMAL LOW (ref 97–108)
Creatinine: 0.75 mg/dL — ABNORMAL LOW (ref 0.76–1.27)
GFR est AA: 98 mL/min/{1.73_m2} (ref >59)
GFR est non-AA: 85 mL/min/{1.73_m2} (ref >59)
GLOBULIN, TOTAL: 2.1 g/dL (ref 1.5–4.5)
Glucose: 84 mg/dL (ref 65–99)
Potassium: 4.4 mmol/L (ref 3.5–5.2)
Protein, total: 6.4 g/dL (ref 6.0–8.5)
Sodium: 136 mmol/L (ref 134–144)

## 2012-03-02 LAB — CBC WITH AUTOMATED DIFF
ABS. BASOPHILS: 0 10*3/uL (ref 0.0–0.2)
ABS. EOSINOPHILS: 0.1 10*3/uL (ref 0.0–0.4)
ABS. IMM. GRANS.: 0 10*3/uL (ref 0.0–0.1)
ABS. MONOCYTES: 0.5 10*3/uL (ref 0.1–0.9)
ABS. NEUTROPHILS: 2.7 10*3/uL (ref 1.4–7.0)
Abs Lymphocytes: 2.3 10*3/uL (ref 0.7–3.1)
BASOPHILS: 0 % (ref 0–3)
EOSINOPHILS: 2 % (ref 0–5)
HCT: 38.3 % (ref 37.5–51.0)
HGB: 13 g/dL (ref 12.6–17.7)
IMMATURE GRANULOCYTES: 0 % (ref 0–2)
Lymphocytes: 41 % (ref 14–46)
MCH: 32.4 pg (ref 26.6–33.0)
MCHC: 33.9 g/dL (ref 31.5–35.7)
MCV: 96 fL (ref 79–97)
MONOCYTES: 8 % (ref 4–12)
NEUTROPHILS: 49 % (ref 40–74)
PLATELET: 205 10*3/uL (ref 155–379)
RBC: 4.01 x10E6/uL — ABNORMAL LOW (ref 4.14–5.80)
RDW: 14.1 % (ref 12.3–15.4)
WBC: 5.6 10*3/uL (ref 3.4–10.8)

## 2012-03-02 LAB — CVD REPORT: PDF IMAGE: 0

## 2012-03-02 LAB — LIPID PANEL
Cholesterol, total: 162 mg/dL (ref 100–199)
HDL Cholesterol: 65 mg/dL (ref >39)
LDL, calculated: 86 mg/dL (ref 0–99)
Triglyceride: 55 mg/dL (ref 0–149)
VLDL, calculated: 11 mg/dL (ref 5–40)

## 2012-03-02 LAB — TSH 3RD GENERATION: TSH: 2.03 u[IU]/mL (ref 0.450–4.500)

## 2012-03-06 NOTE — Progress Notes (Signed)
I reviewed the patient's medical history, the resident's findings on physical examination, the patient's diagnoses, and treatment plan as documented in the resident note.  I concur with the treatment plan as documented.  Additional suggestions noted.  Pt seen and discussed with Dr Okey Dupre

## 2012-03-23 LAB — AMB POC PT/INR
INR POC: 1.9
Prothrombin time (POC): 22.4 s

## 2012-03-23 NOTE — Progress Notes (Signed)
Chief Complaint   Patient presents with   ??? Wax in Ear   ??? Labs     INR     Reviewed record in preparation for visit and have obtained the necessary documentation.

## 2012-03-23 NOTE — Progress Notes (Signed)
Subjective:     Cory Dalton is a 78 y.o. male who presents for evaluation of a plugged ear. He has noticed both symptoms 1 week ago. Seen by audiologist last week and noted to have cerumen impaction. Attempted to remove from left ear with tweezers, but ear started bleeding and procedure stopped. Advised to come to PCP for further evaluation. Has tried Q-tip and liquid solution to the ear without much improvement.     There is a prior history of cerumen impaction.   Patient denies ear pain.   Patient has hearing loss.    Objective:   BP 145/79   Pulse 88   Temp(Src) 97.8 ??F (36.6 ??C) (Oral)   Resp 16   Ht 5\' 11"  (1.803 m)   Wt 185 lb (83.915 kg)   BMI 25.81 kg/m2    General: alert, cooperative, no distress   Right Ear: ceruminosis noted, hearing grossly normal bilaterally.    Left Ear:  ceruminosis noted, hearing grossly normal bilaterally.    After removal: bilateral TM's and external ear canals normal, cerumen removed.      Assessment/Plan:   Cory Dalton is a 78 y.o. male who presents with bilateral cerumen impaction without otitis externa. Successful removal of cerumen from both ears by flushing. Tolerated well with no complications.      1. Cerumen removed by flushing.  2. Care instructions given.  3. Home treatment: none  4. Followup as needed.    INR 1.9 today, but patient recently stopped taking for a few days a procedure. Will continue at current dose of Coumadin 2.5 mg daily and recheck PT/INR in 1 week.     1. Impacted cerumen of both ears  PR REMOVE IMPACTED EAR WAX   2. Paroxysmal atrial fibrillation  AMB POC PT/INR, COLLECTION CAPILLARY BLOOD SPECIMEN, CANCELED: AMB POC PT/INR   3. Long term (current) use of anticoagulants  AMB POC PT/INR, COLLECTION CAPILLARY BLOOD SPECIMEN

## 2012-03-24 NOTE — Progress Notes (Signed)
I reviewed the patient's medical history, the resident's findings on physical examination, the patient's diagnoses, and treatment plan as documented in the resident note.  I concur with the treatment plan as documented.  Additional suggestions noted.

## 2012-04-03 LAB — AMB POC PT/INR
INR POC: 2.2
Prothrombin time (POC): 26.1 s

## 2012-04-13 NOTE — Progress Notes (Signed)
CARDIOLOGY OFFICE NOTE    Cory Jessee A. Makelle Marrone, MD, [facility]    [address], [address], [place], [place] [address]  Phone [phone]; Fax [phone]  Mobile [phone]   Voice Mail [phone]    No admission date for patient encounter.  Cory Boroughs, MD  DOB:  11-21-1926   MRN:  [id number]         Subjective:   LONG-TERM (CURRENT) USE OF ANTICOAGULANT    [name] is a 78 y.o. male I am seeing for hypertension, hyperlipidemia and Atrial Fibrillation. Symptoms include: chest pain  Cardiac risk factors: male gender, hypertension, dyslipidemia..I have personally obtained the history from the patient.I have no sen Mr. Stpaul since 2009. He apparently moved to [place] and [place] now back. He appears to be in chronic atrial fibrillation now. He is off the amiodarone. He is doing well without chest pain or shortness of breath.he will feel SOB at times waking up stairs. He appears to be doing well and has baseline edema. He states his edema is no worse than when living in [place]. His compression hose are old and has not used lately.      No Known Allergies      Past Medical History   Diagnosis Date   ??? Paroxysmal atrial fibrillation 07/04/2007   ??? BPH 07/04/2007   ??? Resting tremor 07/04/2007   ??? BCC (basal cell carcinoma of skin) 07/04/2007   ??? Atrial fibrillation    ??? Skin cancer         Past Surgical History   Procedure Laterality Date   ??? Hx hernia repair  04/2010   ??? Hx cataract removal         Home Medications:  Current Outpatient Prescriptions   Medication Sig   ??? omega-3 fatty acids-vitamin e (FISH OIL) 1,000 mg cap Take 1 Cap by mouth.   ??? amLODIPine (NORVASC) 5 mg tablet Take 5 mg by mouth daily.   ??? lovastatin (MEVACOR) 20 mg tablet Take 1 Tab by mouth nightly.   ??? warfarin (COUMADIN) 2.5 mg tablet Take 1 Tab by mouth daily. Or as directed (generally takes one 2.5 mg pill daily except half pill on Wednesdays)   ??? tamsulosin (FLOMAX) 0.4 mg capsule Take 1 Cap by mouth daily.   ???  Hydrochlorothiazide 12.5 mg tablet Take 12.5 mg by mouth daily.   ??? metoprolol-XL (TOPROL XL) 50 mg XL tablet Take 25 mg by mouth two (2) times a day.   ??? ASPIRIN 81 mg Tab take  by mouth.   ??? DIGOXIN 0.125 mg tablet take 125 mcg by mouth daily.   ??? AVODART 0.5 mg Cap take 0.5 mg by mouth daily.     No current facility-administered medications for this visit.             Objective:   Laboratory and Imaging have been reviewed by me and are notable for        Lab Results   Component Value Date/Time    INR POC 2.2 04/03/2012 10:50 AM    INR POC 1.9 03/23/2012  5:03 PM    INR POC 3.2 03/01/2012  9:45 AM               Cardiac work up to date:      PROCEDURES  DATE       1.  Echocardiogram                 (04/13/07)  EF 55-60%, BAE, Mild TR with       2. Cholesterol profile                                    (pending)                                                  Cholesterol =        HDL  =        LDL  =        Triglycerides  =     Social History:  History   Substance Use Topics   ??? Smoking status: Never Smoker    ??? Smokeless tobacco: Never Used   ??? Alcohol Use: 1.5 oz/week     3 Glasses of wine per week      Comment: 3 er week       Family History:  Family History   Problem Relation Age of Onset   ??? Hypertension Mother    ??? Cancer Father      ?prostate cancer, bone cancer       Review of Symptoms:  A comprehensive review of systems ,particularly related to cardiovascular system, was negative except for that written in the HPI.    Physical Exam:    BP 120/64   Pulse 66   Ht 5\' 11"  (1.803 m)   Wt 193 lb (87.544 kg)   BMI 26.93 kg/m2    Physical Exam:  Gen: Well-developed, well-nourished, in no acute distress  alert and oriented x 3  HEENT:  Pink conjunctivae, Hearing grossly normal.No scleral icterus or conjunctival, moist mucous membranes  Neck: Supple,No JVD, No Carotid Bruit, Thyroid- non tender No cervical lymphadenopathy  Resp: No accessory muscle use, Clear breath sounds, No  rales or rhonchi  Card: Irregular Rate,Rythm,Normal S1, S2, No murmurs, rubs or gallop. No thrills.     MSK: No cyanosis or clubbing, good capillary refill  Skin: No rashes or ulcers, no bruising  Neuro:  Cranial nerves are grossly intact, moving all four extremities, no focal deficit, follows commands appropriately  Psych:  Good insight, oriented to person, place and time, alert, Nml Affect  LE: >1+ edema  Vascular: Distal Pulses 2+ and symmetric        Medication change on this office visit: none    Assessment:   ?? Atrial fibrillation  ?? Edema possibly related to Norvasc he was started on  ?? dyslipidemia   PLAN/discussion:   ?? Stable however has significant edema  ?? Will do echocardiogram to look at LV function  ?? Cholesterol looks good  ?? Will order venous duplex  ?? Place compression hose.   ?? I have extensively reviewed all testing with the patient.    I have discussed the diagnosis with the patient and the intended plan as seen in the above orders.  Questions were answered concerning future plans.  I have discussed medication side effects and warnings with the patient as well.    Cory Dalton is in agreement to the plan listed above and wishes to proceed.  he  was instructed not to smoke, eat heart healthy diet  and to exercise.     Thank you for this consult.    Rushie ChestnutMark Taylon Coole, MD

## 2012-04-28 MED ORDER — TRAMADOL 50 MG TAB
50 mg | ORAL_TABLET | Freq: Two times a day (BID) | ORAL | Status: DC | PRN
Start: 2012-04-28 — End: 2013-06-12

## 2012-04-28 NOTE — Progress Notes (Signed)
Subjective:      Patient : This patient is a 78 y.o. male with a chief complaint of back pain. Onset of pain was 2 days ago.  Patient fell against a rail while getting dressed.  Pain is localized to the mid back.  He denies radiation of pain.  He denies history of back pain. Today the back pain is accompanied by some stiffness.  He does have bruising and is on coumadin.  No bowel or bladder incontinence. No fever, night sweats, or weight changes. No saddle anesthesia.    Review of Systems:  As Per HPI    Past Medical History   Diagnosis Date   ??? Paroxysmal atrial fibrillation 07/04/2007   ??? BPH 07/04/2007   ??? Resting tremor 07/04/2007   ??? BCC (basal cell carcinoma of skin) 07/04/2007   ??? Atrial fibrillation    ??? Skin cancer      Family History   Problem Relation Age of Onset   ??? Hypertension Mother    ??? Cancer Father      ?prostate cancer, bone cancer     Current Outpatient Prescriptions   Medication Sig Dispense Refill   ??? traMADol (ULTRAM) 50 mg tablet Take 1 Tab by mouth two (2) times daily as needed for Pain.  20 Tab  0   ??? omega-3 fatty acids-vitamin e (FISH OIL) 1,000 mg cap Take 1 Cap by mouth.       ??? lovastatin (MEVACOR) 20 mg tablet Take 1 Tab by mouth nightly.  90 Tab  1   ??? warfarin (COUMADIN) 2.5 mg tablet Take 1 Tab by mouth daily. Or as directed (generally takes one 2.5 mg pill daily except half pill on Wednesdays)  90 Tab  3   ??? tamsulosin (FLOMAX) 0.4 mg capsule Take 1 Cap by mouth daily.  90 Cap  3   ??? Hydrochlorothiazide 12.5 mg tablet Take 12.5 mg by mouth daily.       ??? metoprolol-XL (TOPROL XL) 50 mg XL tablet Take 25 mg by mouth two (2) times a day.       ??? ASPIRIN 81 mg Tab take  by mouth.       ??? DIGOXIN 0.125 mg tablet take 125 mcg by mouth daily.       ??? AVODART 0.5 mg Cap take 0.5 mg by mouth daily.       ??? amLODIPine (NORVASC) 5 mg tablet Take 5 mg by mouth daily.         No current facility-administered medications for this visit.     No Known Allergies  History     Social History   ???  Marital Status: MARRIED     Spouse Name: N/A     Number of Children: N/A   ??? Years of Education: N/A     Occupational History   ??? Not on file.     Social History Main Topics   ??? Smoking status: Never Smoker    ??? Smokeless tobacco: Never Used   ??? Alcohol Use: 1.5 oz/week     3 Glasses of wine per week      Comment: 3 er week   ??? Drug Use: No   ??? Sexually Active:      Other Topics Concern   ??? Not on file     Social History Narrative   ??? No narrative on file       Objective:     BP 164/99   Pulse 62  Temp(Src) 97.3 ??F (36.3 ??C) (Oral)   Resp 18   Ht 5\' 11"  (1.803 m)   Wt 189 lb 3.2 oz (85.821 kg)   BMI 26.4 kg/m2   SpO2 98%    General: Alert and oriented and in no acute distress. Responds to all questions appropriately.   LUNGS: Respirations unlabored.  Skin: No obvious rash.  Area of ecchymosis in linear pattern about L1  MSK:    Posture: Normal   Deformity: None    ROM:     Flexion: Normal    Extension: Normal     Lateral bending: Normal      Gait: Normal       Palpation:    L1-L5: No tenderness over vertebrae. Tender laterally over ecchymotic area    Sacrum: No tenderness    Coccyx: No tenderness    Left Paraspinal: No tenderness    Right Paraspinal: No tenderness     Strength (0-5/5)    Hip Flexion:   Left: 5/5  Right: 5/5    Hip Extension:  Left: 5/5  Right: 5/5    Hip Abduction:  Left: 5/5  Right: 5/5    Hip Adduction:  Left: 5/5  Right: 5/5    Knee Extension:  Left: 5/5  Right: 5/5    Knee Flexion:   Left: 5/5  Right: 5/5    Ankle dorsiflexion:  Left: 5/5  Right: 5/5    Ankle plantarflexion:  Left: 5/5  Right: 5/5    Great toe extension:  Left: 5/5  Right: 5/5     Sensation: Intact, no deficits      DTR:    Patella:  Left: +2  Right: +2              X-ray single view was obtained due to mechanical difficulty.  A/P of lumbar spine does not show acute fracture or dislocation.        Assessment:     1. Back pain  XR SPINE LUMB MIN 4 V         Plan:   1. Home Exercise Program as per handout.  2. Tylenol 1-2 tabs q  8hours  3. Avoid NSAIDs  4. Tramadol BID PRN pain if tylenol ineffective

## 2012-04-28 NOTE — Progress Notes (Signed)
.    Chief Complaint   Patient presents with   ??? Back Pain     Patient fell at home on a bar 2 days ago and is experiencing R upper back pain.     .Reviewed record in preparation for visit and have obtained necessary documentation

## 2012-04-29 NOTE — ED Notes (Signed)
Patient states he lost his balance 2 nights ago and hurt his back. Now having abdominal pain.  Nausea NO vomiting. Abdomin is swollen

## 2012-04-29 NOTE — ED Provider Notes (Signed)
HPI Comments: Patient presents with abdominal pain and distension x 2 days with increase gas. Patient reports that he has been having loose stools. Patient denies vomiting. Per patient he also fell two days ago and is having right back pain. Denies urinary symptoms.     Patient is a 78 y.o. male presenting with abdominal pain and back pain. The history is provided by the patient.   Abdominal Pain   This is a new problem. The current episode started 2 days ago. The problem occurs constantly. The problem has been gradually worsening. The pain is located in the generalized abdominal region. The pain is at a severity of 8/10. The pain is severe. Associated symptoms include belching, flatus, nausea and back pain. Pertinent negatives include no anorexia, no diarrhea, no hematochezia, no melena, no vomiting, no constipation, no dysuria, no frequency, no hematuria, no headaches, no myalgias, no trauma, no chest pain and no testicular pain. Nothing worsens the pain. The pain is relieved by nothing. Past workup includes no CT scan, no ultrasound, no surgery, no esophagogastroduodenoscopy, no UGI, no colonoscopy and no barium enema. The patient's surgical history non-contributory.  Back Pain   Associated symptoms include abdominal pain. Pertinent negatives include no chest pain, no headaches and no dysuria. The patient's surgical history non-contributory        Past Medical History   Diagnosis Date   ??? Paroxysmal atrial fibrillation 07/04/2007   ??? BPH 07/04/2007   ??? Resting tremor 07/04/2007   ??? BCC (basal cell carcinoma of skin) 07/04/2007   ??? Atrial fibrillation    ??? Skin cancer    ??? Hypertension         Past Surgical History   Procedure Laterality Date   ??? Hx hernia repair  04/2010   ??? Hx cataract removal           Family History   Problem Relation Age of Onset   ??? Hypertension Mother    ??? Cancer Father      ?prostate cancer, bone cancer        History     Social History   ??? Marital Status: MARRIED     Spouse Name: N/A      Number of Children: N/A   ??? Years of Education: N/A     Occupational History   ??? Not on file.     Social History Main Topics   ??? Smoking status: Never Smoker    ??? Smokeless tobacco: Never Used   ??? Alcohol Use: 1.5 oz/week     3 Glasses of wine per week      Comment: 3 er week   ??? Drug Use: No   ??? Sexually Active: Not on file     Other Topics Concern   ??? Not on file     Social History Narrative   ??? No narrative on file                  ALLERGIES: Review of patient's allergies indicates no known allergies.      Review of Systems   Constitutional: Negative.    HENT: Negative.    Eyes: Negative.    Respiratory: Negative.    Cardiovascular: Negative.  Negative for chest pain.   Gastrointestinal: Positive for nausea, abdominal pain and flatus. Negative for vomiting, diarrhea, constipation, melena, hematochezia and anorexia.   Endocrine: Negative.    Genitourinary: Negative.  Negative for dysuria, frequency, hematuria and testicular pain.   Musculoskeletal: Positive for back pain.  Negative for myalgias.   Skin: Negative.    Allergic/Immunologic: Negative.    Neurological: Negative.  Negative for headaches.   Hematological: Negative.    Psychiatric/Behavioral: Negative.    All other systems reviewed and are negative.        Filed Vitals:    04/29/12 2207 04/29/12 2210 04/29/12 2213   BP: 199/107     Pulse:  156    Temp:   97.8 ??F (36.6 ??C)   Resp: 22     Height: 5\' 10"  (1.778 m)     Weight: 84.823 kg (187 lb)     SpO2: 97%              Physical Exam   Nursing note and vitals reviewed.  Constitutional: He is oriented to person, place, and time. He appears well-developed and well-nourished.   HENT:   Head: Normocephalic and atraumatic.   Right Ear: External ear normal.   Left Ear: External ear normal.   Mouth/Throat: Oropharynx is clear and moist. No oropharyngeal exudate.   Eyes: Conjunctivae and EOM are normal. Pupils are equal, round, and reactive to light. Right eye exhibits no discharge. Left eye exhibits no discharge.  No scleral icterus.   Neck: Normal range of motion. Neck supple. No tracheal deviation present. No thyromegaly present.   Cardiovascular: Normal rate, regular rhythm, normal heart sounds and intact distal pulses.    No murmur heard.  Pulmonary/Chest: Effort normal and breath sounds normal. No respiratory distress. He has no wheezes. He has no rales.       Abdominal: Soft. Bowel sounds are normal. He exhibits distension. There is tenderness. There is no rebound and no guarding.   Musculoskeletal: Normal range of motion. He exhibits no edema and no tenderness.   Lymphadenopathy:     He has no cervical adenopathy.   Neurological: He is alert and oriented to person, place, and time. No cranial nerve deficit. Coordination normal.   Skin: Skin is warm. No rash noted. No erythema.   Psychiatric: He has a normal mood and affect. His behavior is normal. Judgment and thought content normal.        MDM    Procedures

## 2012-04-29 NOTE — ED Notes (Signed)
Unable to get oral temp in triage

## 2012-04-30 LAB — METABOLIC PANEL, COMPREHENSIVE
A-G Ratio: 1 — ABNORMAL LOW (ref 1.1–2.2)
ALT (SGPT): 19 U/L (ref 12–78)
AST (SGOT): 18 U/L (ref 15–37)
Albumin: 3.3 g/dL — ABNORMAL LOW (ref 3.5–5.0)
Alk. phosphatase: 87 U/L (ref 50–136)
Anion gap: 8 mmol/L (ref 5–15)
BUN/Creatinine ratio: 18 (ref 12–20)
BUN: 12 MG/DL (ref 6–20)
Bilirubin, total: 0.9 MG/DL (ref 0.2–1.0)
CO2: 27 MMOL/L (ref 21–32)
Calcium: 8.3 MG/DL — ABNORMAL LOW (ref 8.5–10.1)
Chloride: 93 MMOL/L — ABNORMAL LOW (ref 97–108)
Creatinine: 0.67 MG/DL (ref 0.45–1.15)
GFR est AA: >60 mL/min/{1.73_m2} (ref >60)
GFR est non-AA: >60 mL/min/{1.73_m2} (ref >60)
Globulin: 3.2 g/dL (ref 2.0–4.0)
Glucose: 104 MG/DL — ABNORMAL HIGH (ref 65–100)
Potassium: 3.9 MMOL/L (ref 3.5–5.1)
Protein, total: 6.5 g/dL (ref 6.4–8.2)
Sodium: 128 MMOL/L — ABNORMAL LOW (ref 136–145)

## 2012-04-30 LAB — CBC WITH AUTOMATED DIFF
ABS. BASOPHILS: 0 10*3/uL (ref 0.0–0.1)
ABS. EOSINOPHILS: 0 10*3/uL (ref 0.0–0.4)
ABS. LYMPHOCYTES: 1.5 10*3/uL (ref 0.8–3.5)
ABS. MONOCYTES: 0.5 10*3/uL (ref 0.0–1.0)
ABS. NEUTROPHILS: 6.1 10*3/uL (ref 1.8–8.0)
BASOPHILS: 0 % (ref 0–1)
EOSINOPHILS: 0 % (ref 0–7)
HCT: 35.9 % — ABNORMAL LOW (ref 36.6–50.3)
HGB: 12.7 g/dL (ref 12.1–17.0)
LYMPHOCYTES: 18 % (ref 12–49)
MCH: 33.1 PG (ref 26.0–34.0)
MCHC: 35.4 g/dL (ref 30.0–36.5)
MCV: 93.5 FL (ref 80.0–99.0)
MONOCYTES: 6 % (ref 5–13)
NEUTROPHILS: 76 % — ABNORMAL HIGH (ref 32–75)
PLATELET: 168 10*3/uL (ref 150–400)
RBC: 3.84 M/uL — ABNORMAL LOW (ref 4.10–5.70)
RDW: 13.5 % (ref 11.5–14.5)
WBC: 8.1 10*3/uL (ref 4.1–11.1)

## 2012-04-30 LAB — PROTHROMBIN TIME + INR
INR: 2.9 — ABNORMAL HIGH (ref 0.9–1.1)
Prothrombin time: 29 s — ABNORMAL HIGH (ref 9.4–11.7)

## 2012-04-30 LAB — URINALYSIS W/ REFLEX CULTURE
Bacteria: NEGATIVE /HPF
Glucose: NEGATIVE MG/DL
Ketone: 40 MG/DL — AB
Leukocyte Esterase: NEGATIVE
Nitrites: NEGATIVE
Protein: NEGATIVE MG/DL
Specific gravity: 1.029 (ref 1.003–1.030)
Urobilinogen: 1 EU/DL (ref 0.2–1.0)
pH (UA): 6 (ref 5.0–8.0)

## 2012-04-30 LAB — PTT: aPTT: 37.8 s — ABNORMAL HIGH (ref 23.0–30.0)

## 2012-04-30 LAB — POC TROPONIN-I: Troponin-I (POC): <0.04 ng/mL (ref 0.00–0.08)

## 2012-04-30 LAB — LIPASE: Lipase: 76 U/L (ref 73–393)

## 2012-04-30 MED ORDER — ONDANSETRON (PF) 4 MG/2 ML INJECTION
4 mg/2 mL | INTRAMUSCULAR | Status: AC
Start: 2012-04-30 — End: 2012-04-29
  Administered 2012-04-30: 04:00:00 via INTRAVENOUS

## 2012-04-30 MED ORDER — IOVERSOL 350 MG/ML IV SOLN
350 mg iodine/mL | Freq: Once | INTRAVENOUS | Status: AC
Start: 2012-04-30 — End: 2012-04-30
  Administered 2012-04-30: 06:00:00 via INTRAVENOUS

## 2012-04-30 MED ORDER — SODIUM CHLORIDE 0.9% BOLUS IV
0.9 % | Freq: Once | INTRAVENOUS | Status: AC
Start: 2012-04-30 — End: 2012-04-30
  Administered 2012-04-30: 07:00:00 via INTRAVENOUS

## 2012-04-30 MED ORDER — MORPHINE 4 MG/ML SYRINGE
4 mg/mL | INTRAMUSCULAR | Status: AC
Start: 2012-04-30 — End: 2012-04-29
  Administered 2012-04-30: 04:00:00 via INTRAVENOUS

## 2012-04-30 MED ORDER — HYDROCODONE-ACETAMINOPHEN 5 MG-500 MG TAB
5-500 mg | ORAL_TABLET | Freq: Four times a day (QID) | ORAL | Status: DC | PRN
Start: 2012-04-30 — End: 2013-04-21

## 2012-04-30 MED ORDER — DOCUSATE SODIUM 100 MG CAP
100 mg | ORAL_CAPSULE | Freq: Two times a day (BID) | ORAL | Status: AC
Start: 2012-04-30 — End: 2012-07-29

## 2012-04-30 MED ORDER — DIATRIZOATE MEGLUMINE & SODIUM 66 %-10 % ORAL SOLN
66-10 % | ORAL | Status: AC
Start: 2012-04-30 — End: 2012-04-29
  Administered 2012-04-30: 04:00:00 via ORAL

## 2012-04-30 MED ORDER — ONDANSETRON 4 MG TAB, RAPID DISSOLVE
4 mg | ORAL_TABLET | Freq: Three times a day (TID) | ORAL | Status: DC | PRN
Start: 2012-04-30 — End: 2012-12-05

## 2012-04-30 MED FILL — SODIUM CHLORIDE 0.9 % IV: INTRAVENOUS | Qty: 500

## 2012-04-30 MED FILL — OPTIRAY 350 MG IODINE/ML INTRAVENOUS SOLUTION: 350 mg iodine/mL | INTRAVENOUS | Qty: 100

## 2012-04-30 MED FILL — MORPHINE 4 MG/ML SYRINGE: 4 mg/mL | INTRAMUSCULAR | Qty: 1

## 2012-04-30 MED FILL — MD-GASTROVIEW 66 %-10 % ORAL SOLUTION: 66-10 % | ORAL | Qty: 30

## 2012-04-30 MED FILL — ONDANSETRON (PF) 4 MG/2 ML INJECTION: 4 mg/2 mL | INTRAMUSCULAR | Qty: 2

## 2012-04-30 NOTE — ED Notes (Signed)
I have reviewed discharge instructions with the patient.  The patient verbalized understanding.

## 2012-04-30 NOTE — ED Provider Notes (Signed)
I personally saw and evaluated the patient.  I have reviewed and agree with the MLP's findings, including all diagnostic interpretations, and plans as written.    Anntionette Madkins H Erik Nessel, MD

## 2012-05-01 LAB — AMB POC PT/INR
INR POC: 3.9
Prothrombin time (POC): 47.1 s

## 2012-05-01 LAB — EKG, 12 LEAD, INITIAL
Atrial Rate: 93 {beats}/min
Calculated R Axis: 56 degrees
Calculated T Axis: -25 degrees
Q-T Interval: 348 ms
QRS Duration: 86 ms
QTC Calculation (Bezet): 444 ms
Ventricular Rate: 98 {beats}/min

## 2012-05-01 NOTE — Telephone Encounter (Signed)
Uday in xray asked that I call patient to have him come in for an additional xray today.  I left a message.

## 2012-05-02 LAB — AMB POC HEMOGLOBIN (HGB): Hemoglobin (POC): 13.3

## 2012-05-02 LAB — AMB POC PT/INR
INR POC: 4.1
Prothrombin time (POC): 13.3 s

## 2012-05-02 MED ORDER — OMEPRAZOLE 20 MG TAB, DELAYED RELEASE
20 mg | ORAL_TABLET | Freq: Every day | ORAL | Status: DC
Start: 2012-05-02 — End: 2012-05-02

## 2012-05-02 NOTE — Progress Notes (Signed)
Chief Complaint   Patient presents with   ??? Hospital Follow Up

## 2012-05-02 NOTE — Patient Instructions (Addendum)
Hold coumadin    Obtain chest xray    Return tomorrow    Stop aspirin    Use tylenol for pain

## 2012-05-02 NOTE — Progress Notes (Signed)
Cory Dalton is a 78 y.o. male      Issues discussed today include:    Follow up recent ER visit.  He had a stumble and fall with bruising of right flank one wek ago.  Visited here and had xrays.  He denies use of NSAIDs    Then he started having GI upset that sent him to ER    CT scan 04/29/2012:  5.3 x 7.4 cm complex cyst left kidney. Followup recommended in   4-6 months time to ensure stability. No acute abnormality.    Data reviewed or ordered today:  INR 4.1, HGB 13.3    Other problems include:  Patient Active Problem List   Diagnosis Code   ??? Paroxysmal atrial fibrillation 427.31   ??? BCC (basal cell carcinoma of skin) 173.91   ??? Resting tremor 781.0   ??? BPH (benign prostatic hyperplasia) 600.90   ??? HTN (hypertension) 401.9   ??? S/P colonoscopy V45.89   ??? H/O knee surgery V45.89   ??? Hernia 553.9   ??? Abnormal CT of the abdomen 793.6       Medications:  Current Outpatient Prescriptions   Medication Sig Dispense Refill   ??? Omeprazole delayed release (PRILOSEC D/R) 20 mg tablet Take 1 Tab by mouth daily.  30 Tab  3   ??? ondansetron (ZOFRAN ODT) 4 mg disintegrating tablet Take 1 Tab by mouth every eight (8) hours as needed for Nausea.  20 Tab  0   ??? HYDROcodone-acetaminophen (LORTAB) 5-500 mg per tablet Take 1 Tab by mouth every six (6) hours as needed for Pain.  20 Tab  0   ??? docusate sodium (COLACE) 100 mg capsule Take 1 Cap by mouth two (2) times a day for 90 days.  60 Cap  0   ??? traMADol (ULTRAM) 50 mg tablet Take 1 Tab by mouth two (2) times daily as needed for Pain.  20 Tab  0   ??? omega-3 fatty acids-vitamin e (FISH OIL) 1,000 mg cap Take 1 Cap by mouth.       ??? amLODIPine (NORVASC) 5 mg tablet Take 5 mg by mouth daily.       ??? lovastatin (MEVACOR) 20 mg tablet Take 1 Tab by mouth nightly.  90 Tab  1   ??? warfarin (COUMADIN) 2.5 mg tablet Take 1 Tab by mouth daily. Or as directed (generally takes one 2.5 mg pill daily except half pill on Wednesdays)  90 Tab  3   ??? tamsulosin (FLOMAX) 0.4 mg capsule Take 1 Cap by  mouth daily.  90 Cap  3   ??? Hydrochlorothiazide 12.5 mg tablet Take 12.5 mg by mouth daily.       ??? metoprolol-XL (TOPROL XL) 50 mg XL tablet Take 25 mg by mouth two (2) times a day.       ??? DIGOXIN 0.125 mg tablet take 125 mcg by mouth daily.       ??? AVODART 0.5 mg Cap take 0.5 mg by mouth daily.         No current facility-administered medications for this visit.       Allergies:  No Known Allergies    LMP:  No LMP for male patient.    History     Social History   ??? Marital Status: MARRIED     Spouse Name: N/A     Number of Children: N/A   ??? Years of Education: N/A     Occupational History   ???  Not on file.     Social History Main Topics   ??? Smoking status: Never Smoker    ??? Smokeless tobacco: Never Used   ??? Alcohol Use: 1.5 oz/week     3 Glasses of wine per week      Comment: 3 er week   ??? Drug Use: No   ??? Sexually Active: Not on file     Other Topics Concern   ??? Not on file     Social History Narrative   ??? No narrative on file       Family History   Problem Relation Age of Onset   ??? Hypertension Mother    ??? Cancer Father      ?prostate cancer, bone cancer         Meaningful use:  done      ROS:  Headaches:  no  Chest Pain:  no  SOB:  no  Fevers:  no  Other significant ROS:      No LMP for male patient.    Physical Exam  BP 144/107   Pulse 70   Temp(Src) 98.3 ??F (36.8 ??C) (Oral)   Resp 18   Wt 188 lb 12.8 oz (85.639 kg)   BMI 27.09 kg/m2   SpO2 97%  BP Readings from Last 3 Encounters:   05/02/12 144/107   04/30/12 151/95   04/28/12 164/99     Constitutional:  Appears well,  No Acute Distress, Vitals noted.  He look pale to me  Psychiatric:   Affect normal, Alert and Oriented to person/place/time    Eyes:   Pupils equally round and reactive, EOMI, conjunctiva clear, eyelids normal  ENT:   External ears and nose normal/lips, teeth=OK/gums normal, TMs and Orophyarynx normal  Neck:   general inspection and Thyroid normal.  No abnormal cervical or supraclavicular nodes    Lungs:  Decreased BS right base otherwise clear  to auscultation, good respiratory effort  Heart:  Ausculation fairly normal. Rate controlled.  No cardiac murmurs.  No carotid bruits or palpable thrills  Chest wall normal, +right flank bruising  Abdominal exam:  Lots of gas.  Liver and spleen normal.  Some bruising on buttocks  Extremities, some bruising, noted above  Hemoccult negative, vault empty    Assessment:    Patient Active Problem List   Diagnosis Code   ??? Paroxysmal atrial fibrillation 427.31   ??? BCC (basal cell carcinoma of skin) 173.91   ??? Resting tremor 781.0   ??? BPH (benign prostatic hyperplasia) 600.90   ??? HTN (hypertension) 401.9   ??? S/P colonoscopy V45.89   ??? H/O knee surgery V45.89   ??? Hernia 553.9   ??? Abnormal CT of the abdomen 793.6       Today's diagnoses are:  1. Paroxysmal atrial fibrillation  AMB POC HEMOGLOBIN (HGB), AMB POC PT/INR    on coumadin   2. Abnormal CT of the abdomen      needs repeat in 06/2012   3. Flank pain  XR CHEST PA LAT   4. Gastritis  Omeprazole delayed release (PRILOSEC D/R) 20 mg tablet    start PPI       Plan:  Orders Placed This Encounter   ??? XR CHEST PA LAT     Standing Status: Future      Number of Occurrences:       Standing Expiration Date: 06/02/2013     Order Specific Question:  Reason for Exam     Answer:  flank  bruising after fall     Order Specific Question:  Is Patient Allergic to Contrast Dye?     Answer:  Unknown   ??? AMB POC HEMOGLOBIN (HGB)   ??? AMB POC PT/INR   ??? Omeprazole delayed release (PRILOSEC D/R) 20 mg tablet     Sig: Take 1 Tab by mouth daily.     Dispense:  30 Tab     Refill:  3       See patient instructions  Patient Instructions   Hold coumadin    Obtain chest xray    Return tomorrow    Stop aspirin    Use tylenol for pain        MYCHART:  info given    refresh note:  done    AVS Printed:  done

## 2012-05-03 LAB — AMB POC HEMOGLOBIN (HGB): Hemoglobin (POC): 11.5

## 2012-05-03 LAB — AMB POC PT/INR
INR POC: 2.3
Prothrombin time (POC): 27.5 s

## 2012-05-03 MED ORDER — OMEPRAZOLE 20 MG CAP, DELAYED RELEASE
20 mg | ORAL_CAPSULE | ORAL | Status: DC
Start: 2012-05-03 — End: 2013-04-21

## 2012-05-03 NOTE — Progress Notes (Signed)
Quick Note:    There is scarring in the lung bases. Follow up as we discussed.  ______

## 2012-05-03 NOTE — Progress Notes (Signed)
I discussed the findings, assessment and plan with the resident and agree with the resident's findings and plan as documented in the resident's note.

## 2012-05-03 NOTE — Patient Instructions (Addendum)
OK to resume coumadin    Start prilosec    Do NOT take aspirin    Follow up for INR and HGB tomorrow.  If his HGB drops, may need to cancel international trip and get in to see GI    I suggest that he resume norvasc

## 2012-05-03 NOTE — Progress Notes (Signed)
Cory Dalton is a 78 y.o.  male  Issues discussed today include:    Follow up of chronic conditions, see diagnoses    See previous notes    CXR shows ?scarring of lung bases.  No fractures    He did not start PPI yet.  I asked him to do so.    He may have taken pepto bisnol at the start of his GI upset and now is noting black stool.  He was heme negative yesterday      Data reviewed or ordered today:    POC INR:  2.3 today (4 yesterday)  POC HGB:  11.5 today (13.3 yesterday)    Other problems include:  Patient Active Problem List   Diagnosis Code   ??? Paroxysmal atrial fibrillation 427.31   ??? BCC (basal cell carcinoma of skin) 173.91   ??? Resting tremor 781.0   ??? BPH (benign prostatic hyperplasia) 600.90   ??? HTN (hypertension) 401.9   ??? S/P colonoscopy V45.89   ??? H/O knee surgery V45.89   ??? Hernia 553.9   ??? Abnormal CT of the abdomen 793.6       Medications:  Current Outpatient Prescriptions   Medication Sig Dispense Refill   ??? omeprazole (PRILOSEC) 20 mg capsule TAKE ONE CAPSULE BY MOUTH DAILY  90 Cap  3   ??? ondansetron (ZOFRAN ODT) 4 mg disintegrating tablet Take 1 Tab by mouth every eight (8) hours as needed for Nausea.  20 Tab  0   ??? HYDROcodone-acetaminophen (LORTAB) 5-500 mg per tablet Take 1 Tab by mouth every six (6) hours as needed for Pain.  20 Tab  0   ??? docusate sodium (COLACE) 100 mg capsule Take 1 Cap by mouth two (2) times a day for 90 days.  60 Cap  0   ??? traMADol (ULTRAM) 50 mg tablet Take 1 Tab by mouth two (2) times daily as needed for Pain.  20 Tab  0   ??? omega-3 fatty acids-vitamin e (FISH OIL) 1,000 mg cap Take 1 Cap by mouth.       ??? amLODIPine (NORVASC) 5 mg tablet Take 5 mg by mouth daily.       ??? lovastatin (MEVACOR) 20 mg tablet Take 1 Tab by mouth nightly.  90 Tab  1   ??? warfarin (COUMADIN) 2.5 mg tablet Take 1 Tab by mouth daily. Or as directed (generally takes one 2.5 mg pill daily except half pill on Wednesdays)  90 Tab  3   ??? tamsulosin (FLOMAX) 0.4 mg capsule Take 1 Cap by mouth  daily.  90 Cap  3   ??? Hydrochlorothiazide 12.5 mg tablet Take 12.5 mg by mouth daily.       ??? metoprolol-XL (TOPROL XL) 50 mg XL tablet Take 25 mg by mouth two (2) times a day.       ??? DIGOXIN 0.125 mg tablet take 125 mcg by mouth daily.       ??? AVODART 0.5 mg Cap take 0.5 mg by mouth daily.         No current facility-administered medications for this visit.       Allergies:  No Known Allergies    LMP:  No LMP for male patient.    History     Social History   ??? Marital Status: MARRIED     Spouse Name: N/A     Number of Children: N/A   ??? Years of Education: N/A     Occupational History   ???  Not on file.     Social History Main Topics   ??? Smoking status: Never Smoker    ??? Smokeless tobacco: Never Used   ??? Alcohol Use: 1.5 oz/week     3 Glasses of wine per week      Comment: 3 er week   ??? Drug Use: No   ??? Sexually Active: Not on file     Other Topics Concern   ??? Not on file     Social History Narrative   ??? No narrative on file       Family History   Problem Relation Age of Onset   ??? Hypertension Mother    ??? Cancer Father      ?prostate cancer, bone cancer       Meaningful use:  done      Physical Exam:  BP 173/108   Pulse 73   Temp(Src) 97.2 ??F (36.2 ??C) (Axillary)   Resp 18   Ht 5\' 10"  (1.778 m)   Wt 184 lb (83.462 kg)   BMI 26.4 kg/m2    BP Readings from Last 3 Encounters:   05/03/12 173/108   05/02/12 144/107   04/30/12 151/95         Assessment:    Patient Active Problem List   Diagnosis Code   ??? Paroxysmal atrial fibrillation 427.31   ??? BCC (basal cell carcinoma of skin) 173.91   ??? Resting tremor 781.0   ??? BPH (benign prostatic hyperplasia) 600.90   ??? HTN (hypertension) 401.9   ??? S/P colonoscopy V45.89   ??? H/O knee surgery V45.89   ??? Hernia 553.9   ??? Abnormal CT of the abdomen 793.6       Today's diagnoses are:  1. Paroxysmal atrial fibrillation  AMB POC PT/INR, AMB POC HEMOGLOBIN (HGB)   2. Flank pain      CXR negative for fracture after fall   3. Abnormal CT of the abdomen      will need repeat CT 2014        Plan:  Orders Placed This Encounter   ??? AMB POC PT/INR   ??? AMB POC HEMOGLOBIN (HGB)       See patient instructions  Patient Instructions   OK to resume coumadin    Start prilosec    Do NOT take aspirin    Follow up for INR and HGB tomorrow.  If his HGB drops, may need to cancel international trip and get in to see GI    I suggest that he resume norvasc            MYCHART:  info given    refresh note:  done    AVS Printed:  done      The majority of today's visit was counseling or coordination of care 15 minutes.     Total face to face time:  15  Time spent counseling:  15  Subject:  See diagnoses.

## 2012-05-03 NOTE — Progress Notes (Signed)
See scanned report.

## 2012-05-04 LAB — AMB POC PT/INR
INR POC: 1.9
Prothrombin time (POC): 23.4 s

## 2012-05-04 LAB — AMB POC HEMOGLOBIN (HGB): Hemoglobin (POC): 14.2

## 2012-05-04 NOTE — Patient Instructions (Signed)
Ok to travel

## 2012-05-04 NOTE — Progress Notes (Signed)
Chief Complaint   Patient presents with   ??? Follow-up     pt/inr

## 2012-05-04 NOTE — Progress Notes (Signed)
Cory Dalton is a 78 y.o.  male  Issues discussed today include:    Follow up of chronic conditions, see diagnoses    See previous notes    POC INR today:  1.9  POC HGB today:  14.2 (stable)    He feels well.  At this point I think it is OK for him to travel.  Use caution and common sense.  If we have to do a letter of medical need to cancel his trip we can but for now will plan on his going to [place]      Data reviewed or ordered today:  Needs CT scan 4.2014    Other problems include:  Patient Active Problem List   Diagnosis Code   ??? Paroxysmal atrial fibrillation 427.31   ??? BCC (basal cell carcinoma of skin) 173.91   ??? Resting tremor 781.0   ??? BPH (benign prostatic hyperplasia) 600.90   ??? HTN (hypertension) 401.9   ??? S/P colonoscopy V45.89   ??? H/O knee surgery V45.89   ??? Hernia 553.9   ??? Abnormal CT of the abdomen 793.6       Medications:  Current Outpatient Prescriptions   Medication Sig Dispense Refill   ??? omeprazole (PRILOSEC) 20 mg capsule TAKE ONE CAPSULE BY MOUTH DAILY  90 Cap  3   ??? ondansetron (ZOFRAN ODT) 4 mg disintegrating tablet Take 1 Tab by mouth every eight (8) hours as needed for Nausea.  20 Tab  0   ??? HYDROcodone-acetaminophen (LORTAB) 5-500 mg per tablet Take 1 Tab by mouth every six (6) hours as needed for Pain.  20 Tab  0   ??? docusate sodium (COLACE) 100 mg capsule Take 1 Cap by mouth two (2) times a day for 90 days.  60 Cap  0   ??? traMADol (ULTRAM) 50 mg tablet Take 1 Tab by mouth two (2) times daily as needed for Pain.  20 Tab  0   ??? omega-3 fatty acids-vitamin e (FISH OIL) 1,000 mg cap Take 1 Cap by mouth.       ??? amLODIPine (NORVASC) 5 mg tablet Take 5 mg by mouth daily.       ??? lovastatin (MEVACOR) 20 mg tablet Take 1 Tab by mouth nightly.  90 Tab  1   ??? warfarin (COUMADIN) 2.5 mg tablet Take 1 Tab by mouth daily. Or as directed (generally takes one 2.5 mg pill daily except half pill on Wednesdays)  90 Tab  3   ??? tamsulosin (FLOMAX) 0.4 mg capsule Take 1 Cap by mouth daily.  90 Cap  3    ??? Hydrochlorothiazide 12.5 mg tablet Take 12.5 mg by mouth daily.       ??? metoprolol-XL (TOPROL XL) 50 mg XL tablet Take 25 mg by mouth two (2) times a day.       ??? DIGOXIN 0.125 mg tablet take 125 mcg by mouth daily.       ??? AVODART 0.5 mg Cap take 0.5 mg by mouth daily.         No current facility-administered medications for this visit.       Allergies:  No Known Allergies    LMP:  No LMP for male patient.    History     Social History   ??? Marital Status: MARRIED     Spouse Name: N/A     Number of Children: N/A   ??? Years of Education: N/A     Occupational History   ???  Not on file.     Social History Main Topics   ??? Smoking status: Never Smoker    ??? Smokeless tobacco: Never Used   ??? Alcohol Use: 1.5 oz/week     3 Glasses of wine per week      Comment: 3 er week   ??? Drug Use: No   ??? Sexually Active: Not on file     Other Topics Concern   ??? Not on file     Social History Narrative   ??? No narrative on file       Family History   Problem Relation Age of Onset   ??? Hypertension Mother    ??? Cancer Father      ?prostate cancer, bone cancer       Meaningful use:  done      ROS:  The patient denies:  Headches/Chest Pain/SOB/fevers  Any positive ROS include: black stool from pepto bismol     No LMP for male patient.    Physical Exam  BP 132/87   Pulse 57   Resp 14   Wt 185 lb (83.915 kg)   BMI 26.54 kg/m2  BP Readings from Last 3 Encounters:   05/04/12 132/87   05/03/12 173/108   05/02/12 144/107       Constitutional:  Appears well,  No Acute Distress, Vitals noted  Psychiatric:   Affect normal, Alert and Oriented to person/place/time    Eyes:   Pupils equally round and reactive, EOMI, conjunctiva clear, eyelids normal  ENT:   External ears and nose normal/lips, teeth=OK/gums normal, TMs and Orophyarynx normal  Neck:   general inspection and Thyroid normal.  No abnormal cervical or supraclavicular nodes    Lungs:   clear to auscultation, good respiratory effort  Heart:   Ausculation normal.  Regular rhythm.  No cardiac  murmurs.  No carotid bruits or palpable thrills  Chest wall normal    Abdominal exam:   normal.  Liver and spleen normal.  No bruits/masses/tenderness    Extremities:   without edema, good peripheral pulses  Skin:   Warm to palpation, without rashes, bruising, or suspicious lesions           Assessment:    Patient Active Problem List   Diagnosis Code   ??? Paroxysmal atrial fibrillation 427.31   ??? BCC (basal cell carcinoma of skin) 173.91   ??? Resting tremor 781.0   ??? BPH (benign prostatic hyperplasia) 600.90   ??? HTN (hypertension) 401.9   ??? S/P colonoscopy V45.89   ??? H/O knee surgery V45.89   ??? Hernia 553.9   ??? Abnormal CT of the abdomen 793.6       Today's diagnoses are:  1. Gastritis     improving on PPI   2. Abnormal CT of the abdomen     repeat in 2014   3. Paroxysmal atrial fibrillation     INR 1.9, now back on coumadin       Plan:  No orders of the defined types were placed in this encounter.       See patient instructions  Patient Instructions   Ok to travel          [facility]:  info given    refresh note:  done    AVS Printed:  done      The majority of today's visit was counseling or coordination of care 25 minutes.     Total face to face time:  25  Time spent counseling:  20  Subject:  See diagnoses.   We discussed safe travel

## 2012-05-11 NOTE — Addendum Note (Signed)
Addended by: Marciano Sequin C on: 05/11/2012 12:41 PM     Modules accepted: Orders

## 2012-05-22 LAB — AMB POC PT/INR
INR POC: 1.8
Prothrombin time (POC): 21.6 s

## 2012-05-23 NOTE — Patient Instructions (Addendum)
See GI doctor for follow up of gastritis    Obtain another CT scan in April 2014    Continue current coumadin dose, recheck INR monthly

## 2012-05-23 NOTE — Progress Notes (Signed)
Cory Dalton is a 78 y.o.  male  Issues discussed today include:    Follow up of chronic conditions, see diagnoses    Tolerated his trip to [place] well  Gastritis better with PPI but he did have one bout while overseas.      Data reviewed or ordered today: needs CT 08/2012    Other problems include:  Patient Active Problem List   Diagnosis Code   ??? Paroxysmal atrial fibrillation 427.31   ??? BCC (basal cell carcinoma of skin) 173.91   ??? Resting tremor 781.0   ??? BPH (benign prostatic hyperplasia) 600.90   ??? HTN (hypertension) 401.9   ??? S/P colonoscopy V45.89   ??? H/O knee surgery V45.89   ??? Hernia 553.9   ??? Abnormal CT of the abdomen 793.6       Medications:  Current Outpatient Prescriptions   Medication Sig Dispense Refill   ??? omeprazole (PRILOSEC) 20 mg capsule TAKE ONE CAPSULE BY MOUTH DAILY  90 Cap  3   ??? ondansetron (ZOFRAN ODT) 4 mg disintegrating tablet Take 1 Tab by mouth every eight (8) hours as needed for Nausea.  20 Tab  0   ??? HYDROcodone-acetaminophen (LORTAB) 5-500 mg per tablet Take 1 Tab by mouth every six (6) hours as needed for Pain.  20 Tab  0   ??? docusate sodium (COLACE) 100 mg capsule Take 1 Cap by mouth two (2) times a day for 90 days.  60 Cap  0   ??? traMADol (ULTRAM) 50 mg tablet Take 1 Tab by mouth two (2) times daily as needed for Pain.  20 Tab  0   ??? omega-3 fatty acids-vitamin e (FISH OIL) 1,000 mg cap Take 1 Cap by mouth.       ??? amLODIPine (NORVASC) 5 mg tablet Take 5 mg by mouth daily.       ??? lovastatin (MEVACOR) 20 mg tablet Take 1 Tab by mouth nightly.  90 Tab  1   ??? warfarin (COUMADIN) 2.5 mg tablet Take 1 Tab by mouth daily. Or as directed (generally takes one 2.5 mg pill daily except half pill on Wednesdays)  90 Tab  3   ??? tamsulosin (FLOMAX) 0.4 mg capsule Take 1 Cap by mouth daily.  90 Cap  3   ??? Hydrochlorothiazide 12.5 mg tablet Take 12.5 mg by mouth daily.       ??? metoprolol-XL (TOPROL XL) 50 mg XL tablet Take 25 mg by mouth two (2) times a day.       ??? DIGOXIN 0.125 mg tablet  take 125 mcg by mouth daily.       ??? AVODART 0.5 mg Cap take 0.5 mg by mouth daily.         No current facility-administered medications for this visit.       Allergies:  No Known Allergies    LMP:  No LMP for male patient.    History     Social History   ??? Marital Status: MARRIED     Spouse Name: N/A     Number of Children: N/A   ??? Years of Education: N/A     Occupational History   ??? Not on file.     Social History Main Topics   ??? Smoking status: Never Smoker    ??? Smokeless tobacco: Never Used   ??? Alcohol Use: 1.5 oz/week     3 Glasses of wine per week      Comment: 3 er week   ???  Drug Use: No   ??? Sexually Active: Not on file     Other Topics Concern   ??? Not on file     Social History Narrative   ??? No narrative on file       Family History   Problem Relation Age of Onset   ??? Hypertension Mother    ??? Cancer Father      ?prostate cancer, bone cancer       Meaningful use:  done      ROS:  The patient denies:  Headches/Chest Pain/SOB/fevers  Any positive ROS include: none noted     No LMP for male patient.    Physical Exam  BP 136/94   Pulse 85   Temp(Src) 95.9 ??F (35.5 ??C) (Axillary)   Resp 16   Ht 5\' 10"  (1.778 m)   Wt 186 lb (84.369 kg)   BMI 26.69 kg/m2  BP Readings from Last 3 Encounters:   05/23/12 136/94   05/04/12 132/87   05/03/12 173/108       Constitutional:  Appears well,  No Acute Distress, Vitals noted  Psychiatric:   Affect normal, Alert and Oriented to person/place/time    Eyes:   Pupils equally round and reactive, EOMI, conjunctiva clear, eyelids normal  ENT:   External ears and nose normal/lips, teeth=OK/gums normal, TMs and Orophyarynx normal  Neck:   general inspection and Thyroid normal.  No abnormal cervical or supraclavicular nodes    Lungs:   clear to auscultation, good respiratory effort  Heart:   Ausculation normal.  Regular rhythm.  No cardiac murmurs.  No carotid bruits or palpable thrills  Chest wall normal    Abdominal exam:   normal.  Liver and spleen normal.  No bruits/masses/tenderness     Extremities:   without edema, good peripheral pulses  Skin:   Warm to palpation, without rashes, bruising, or suspicious lesions           Assessment:    Patient Active Problem List   Diagnosis Code   ??? Paroxysmal atrial fibrillation 427.31   ??? BCC (basal cell carcinoma of skin) 173.91   ??? Resting tremor 781.0   ??? BPH (benign prostatic hyperplasia) 600.90   ??? HTN (hypertension) 401.9   ??? S/P colonoscopy V45.89   ??? H/O knee surgery V45.89   ??? Hernia 553.9   ??? Abnormal CT of the abdomen 793.6       Today's diagnoses are:  1. Abnormal CT of the abdomen  REFERRAL TO GASTROENTEROLOGY    repeat CT scan 08/2012   2. Gastritis  REFERRAL TO GASTROENTEROLOGY    continue PPI, GI evaluation   3. Paroxysmal atrial fibrillation      INR 1.8, coumadin 2.5 mg       Plan:  Orders Placed This Encounter   ??? REFERRAL TO GASTROENTEROLOGY     Referral Priority:  Routine     Referral Type:  Consultation     Referral Reason:  Specialty Services Required     Referred to Provider:  Garnetta Buddy, MD     Requested Specialty:  Gastroenterology     Number of Visits Requested:  1       See patient instructions  Patient Instructions   See GI doctor for follow up of gastritis    Obtain another CT scan in April 2014        MYCHART:  info given    refresh note:  done    AVS Printed:  done  The majority of today's visit was counseling or coordination of care 25 minutes.     Total face to face time:  25  Time spent counseling:  15  Subject:  See diagnoses.

## 2012-05-23 NOTE — Progress Notes (Signed)
Chief Complaint   Patient presents with   ??? Follow-up     follow up visit on gastronitis     Reviewed record in preparation for visit and have obtained necessary documentation.

## 2012-06-05 LAB — AMB POC PT/INR
INR POC: 2.1
Prothrombin time (POC): 25.5 s

## 2012-06-26 ENCOUNTER — Encounter

## 2012-06-26 MED ORDER — LOVASTATIN 20 MG TAB
20 mg | ORAL_TABLET | Freq: Every evening | ORAL | Status: DC
Start: 2012-06-26 — End: 2012-12-25

## 2012-06-26 NOTE — Telephone Encounter (Signed)
From: Seleta Rhymes  To: Jennye Boroughs, MD  Sent: 06/25/2012 8:25 AM EST  Subject: Medication Renewal Request    Original authorizing provider: Jennye Boroughs, MD    Seleta Rhymes would like a refill of the following medications:  lovastatin (MEVACOR) 20 mg tablet Jennye Boroughs, MD]    Preferred pharmacy: [id number] - [place], [place] - [address] AT [address] OF [address] ([address]    Comment:

## 2012-07-17 NOTE — Progress Notes (Signed)
Labs drawn.

## 2012-07-18 LAB — LIPID PANEL
Cholesterol, total: 141 mg/dL (ref 100–199)
HDL Cholesterol: 62 mg/dL (ref >39)
LDL, calculated: 72 mg/dL (ref 0–99)
Triglyceride: 34 mg/dL (ref 0–149)
VLDL, calculated: 7 mg/dL (ref 5–40)

## 2012-07-18 LAB — CVD REPORT: PDF IMAGE: 0

## 2012-07-18 LAB — PROTHROMBIN TIME + INR
INR: 1.9 — ABNORMAL HIGH (ref 0.8–1.2)
Prothrombin time: 20.4 s — ABNORMAL HIGH (ref 9.1–12.0)

## 2012-07-18 LAB — ALT: ALT (SGPT): 13 IU/L (ref 0–44)

## 2012-07-18 NOTE — Progress Notes (Signed)
Quick Note:    Your lipids are good. Recheck in 6 months.  ______

## 2012-08-03 LAB — AMB POC PT/INR
INR POC: 1.8
Prothrombin time (POC): 21.9 s

## 2012-08-03 MED ORDER — DUTASTERIDE 0.5 MG CAP
0.5 mg | ORAL_CAPSULE | Freq: Every day | ORAL | Status: DC
Start: 2012-08-03 — End: 2012-08-09

## 2012-08-09 ENCOUNTER — Encounter

## 2012-08-10 MED ORDER — HYDROCHLOROTHIAZIDE 12.5 MG TAB
12.5 mg | ORAL_TABLET | Freq: Every day | ORAL | Status: DC
Start: 2012-08-10 — End: 2012-08-14

## 2012-08-10 MED ORDER — DUTASTERIDE 0.5 MG CAP
0.5 mg | ORAL_CAPSULE | Freq: Every day | ORAL | Status: DC
Start: 2012-08-10 — End: 2012-08-21

## 2012-08-14 ENCOUNTER — Encounter

## 2012-08-14 ENCOUNTER — Telehealth

## 2012-08-14 MED ORDER — HYDROCHLOROTHIAZIDE 12.5 MG TAB
12.5 mg | ORAL_TABLET | Freq: Every day | ORAL | Status: DC
Start: 2012-08-14 — End: 2012-08-14

## 2012-08-14 MED ORDER — HYDROCHLOROTHIAZIDE 12.5 MG TAB
12.5 mg | ORAL_TABLET | Freq: Every day | ORAL | Status: DC
Start: 2012-08-14 — End: 2013-04-21

## 2012-08-14 NOTE — Telephone Encounter (Signed)
rx approved by Dr. Doloresco.

## 2012-08-14 NOTE — Telephone Encounter (Signed)
Hydrochlorothiazide 12.5 mg daily needs to be sent to [facility] not [facility].Marland Kitchen

## 2012-08-16 LAB — AMB POC PT/INR
INR POC: 1.5
Prothrombin time (POC): 18.1 s

## 2012-08-21 ENCOUNTER — Encounter

## 2012-08-21 MED ORDER — DUTASTERIDE 0.5 MG CAP
0.5 mg | ORAL_CAPSULE | Freq: Every day | ORAL | Status: DC
Start: 2012-08-21 — End: 2013-07-26

## 2012-08-21 NOTE — Telephone Encounter (Signed)
Cancel pharmacy order, patient needs refill to go through [facility].

## 2012-08-21 NOTE — Telephone Encounter (Signed)
Chief Complaint   Patient presents with   ??? Medication Refill     Reviewed record in preparation for visit and have obtained necessary documentation.

## 2012-08-23 LAB — AMB POC PT/INR
INR POC: 3.5
Prothrombin time (POC): 41.8 s

## 2012-08-30 LAB — AMB POC PT/INR
INR POC: 2.1
Prothrombin time (POC): 25.3 s

## 2012-09-04 ENCOUNTER — Encounter

## 2012-09-13 LAB — AMB POC PT/INR
INR POC: 3.8
Prothrombin time (POC): 46.2 s

## 2012-09-25 LAB — AMB POC PT/INR
INR POC: 2.5
Prothrombin time (POC): 29.6 s

## 2012-10-03 LAB — AMB POC PT/INR
INR POC: 2.7
Prothrombin time (POC): 32.5 s

## 2012-10-03 NOTE — Progress Notes (Signed)
Called pt for Dr Jceon Rosenthal to be sure he was going to follow through with having a CT.  He said that all of his family had just gotten together and he just hadn't had time but now they are going he is going to look at his schedule and call to set it up.  Told him if he needs me to help to just let me know, I would be happy to help.

## 2012-10-09 ENCOUNTER — Encounter

## 2012-10-09 MED ORDER — DIGOXIN 0.125 MG TAB
0.125 mg | ORAL_TABLET | Freq: Every day | ORAL | Status: DC
Start: 2012-10-09 — End: 2013-04-14

## 2012-10-09 MED ORDER — TAMSULOSIN SR 0.4 MG 24 HR CAP
0.4 mg | ORAL_CAPSULE | Freq: Every day | ORAL | Status: DC
Start: 2012-10-09 — End: 2013-07-30

## 2012-10-13 NOTE — Progress Notes (Signed)
Called pt for Dr Eon Rosenthal to be sure he was going to follow through with having a CT.  There was no answer, left message:    This is [place] from [facility], trying to reach [employer].  If you would like to call me back you can reach me at [phone], and you can ask for Vickie.  Thank You.

## 2012-10-17 LAB — CREATININE, POC
Creatinine (POC): 1 MG/DL (ref 0.6–1.3)
GFRAA, POC: >60 mL/min/{1.73_m2} (ref >60)
GFRNA, POC: >60 mL/min/{1.73_m2} (ref >60)

## 2012-10-17 MED ORDER — IOVERSOL 350 MG/ML IV SOLN
350 mg iodine/mL | Freq: Once | INTRAVENOUS | Status: AC
Start: 2012-10-17 — End: 2012-10-17
  Administered 2012-10-17: 14:00:00 via INTRAVENOUS

## 2012-10-17 MED FILL — OPTIRAY 350 MG IODINE/ML INTRAVENOUS SOLUTION: 350 mg iodine/mL | INTRAVENOUS | Qty: 100

## 2012-10-17 NOTE — Progress Notes (Signed)
Quick Note:    Your CT scan is stable. Nothing else to do.  ______

## 2012-10-23 LAB — AMB POC PT/INR
INR POC: 3.4
Prothrombin time (POC): 40.9 s

## 2012-10-28 NOTE — Progress Notes (Signed)
Subjective:     Cory Dalton is a 79 y.o. male who presents for evaluation of presumed tick bite 5 days ago.  He was gardening 6 days ago and the following day he noted a tiny "tick" on left inner thigh while showering.  He removed left tick with difficulty, but noted a dark spot in center, so kept "digging" to get it out.  He now notes redness around this area.  No fevers, chills, arthralgias or new rash.    He would also like ears checked due to frequent cerumen impaction.    Past Medical History   Diagnosis Date   ??? Paroxysmal atrial fibrillation 07/04/2007   ??? BPH 07/04/2007   ??? Resting tremor 07/04/2007   ??? BCC (basal cell carcinoma of skin) 07/04/2007   ??? Atrial fibrillation    ??? Skin cancer    ??? Hypertension      Current Outpatient Prescriptions   Medication Sig Dispense Refill   ??? digoxin (LANOXIN) 0.125 mg tablet Take 1 Tab by mouth daily.  90 Tab  1   ??? tamsulosin (FLOMAX) 0.4 mg capsule Take 1 Cap by mouth daily.  90 Cap  3   ??? dutasteride (AVODART) 0.5 mg capsule Take 1 Cap by mouth daily.  90 Cap  3   ??? Hydrochlorothiazide 12.5 mg tablet Take 12.5 mg by mouth daily.  90 Tab  3   ??? lovastatin (MEVACOR) 20 mg tablet Take 1 Tab by mouth nightly.  90 Tab  1   ??? omeprazole (PRILOSEC) 20 mg capsule TAKE ONE CAPSULE BY MOUTH DAILY  90 Cap  3   ??? ondansetron (ZOFRAN ODT) 4 mg disintegrating tablet Take 1 Tab by mouth every eight (8) hours as needed for Nausea.  20 Tab  0   ??? HYDROcodone-acetaminophen (LORTAB) 5-500 mg per tablet Take 1 Tab by mouth every six (6) hours as needed for Pain.  20 Tab  0   ??? traMADol (ULTRAM) 50 mg tablet Take 1 Tab by mouth two (2) times daily as needed for Pain.  20 Tab  0   ??? omega-3 fatty acids-vitamin e (FISH OIL) 1,000 mg cap Take 1 Cap by mouth.       ??? amLODIPine (NORVASC) 5 mg tablet Take 5 mg by mouth daily.       ??? warfarin (COUMADIN) 2.5 mg tablet Take 1 Tab by mouth daily. Or as directed (generally takes one 2.5 mg pill daily except half pill on Wednesdays)  90 Tab  3   ???  metoprolol-XL (TOPROL XL) 50 mg XL tablet Take 25 mg by mouth two (2) times a day.         No Known Allergies  ROS:  Per HPI    Objective:      BP 124/81   Pulse 69   Temp(Src) 97.4 ??F (36.3 ??C) (Oral)   Resp 18   Wt 191 lb 3.2 oz (86.728 kg)   BMI 27.43 kg/m2   SpO2 96%  Gen: Alert, oriented x 3, NAD  ENT: bilat TMs obscured by cerumen.  Wax not easily removed with currette, thus was lavaged by nursing staff with good result.  Right canal clear.  Left TM with minimal residual cerumen.  CV: RRR. No murmur/gallop/rub.  Resp: CTAB. No wheeze/rales/rhonchi. Normal work of breathing.  Skin: Left innter thigh area of tick bite examined - subcutaneous 1cm x 1cm ecchymosis. No purulence or retained foreign body on visual inspection.  No fluctuance. No warmth.  Assessment/Plan:     5 days s/p reported tick bite with traumatic removal by patient.  Area of redness is ecchymotic, but does not appear infected. Out of window of prophylaxis.  Reviewed si/sx lyme disease and gave pt handout w/ sxs to report within next 30 day window.  Pt and wife indicate understanding.       ICD-9-CM    1. Tick bite 919.4    2. Cerumen impaction, bilateral 380.4 PR REMOVE IMPACTED EAR WAX   3. Impacted cerumen 380.4 PR REMOVE IMPACTED EAR WAX

## 2012-10-28 NOTE — Progress Notes (Signed)
Chief Complaint   Patient presents with   ??? Tick Removal

## 2012-10-28 NOTE — Patient Instructions (Signed)
Tick Bite: After Your Visit  Your Care Instructions  Ticks are small spiderlike animals. They bite to fasten themselves onto your skin and feed on your blood.  Ticks can carry diseases. But most ticks do not carry diseases, and most tick bites do not cause serious health problems.  Some people may have an allergic reaction to a tick bite. This reaction may be mild, with symptoms like itching and swelling. In rare cases, a severe allergic reaction may occur.  Most of the time, all you need to do for a tick bite is relieve any symptoms you may have.  Follow-up care is a key part of your treatment and safety. Be sure to make and go to all appointments, and call your doctor if you are having problems. It's also a good idea to know your test results and keep a list of the medicines you take.  How can you care for yourself at home?  ?? Put ice or a cold pack on the bite for 15 to 20 minutes once an hour. Put a thin cloth between the ice and your skin.  ?? Try an over-the-counter medicine to relieve itching, redness, swelling, and pain. Read and follow all instructions on the label.  ?? Take an antihistamine medicine, such as chlorpheniramine (Chlor-Trimeton) or diphenhydramine (Benadryl). These medicines may help relieve itching, redness, and swelling.  ?? Use a spray of local anesthetic that contains benzocaine, such as Solarcaine. It may help relieve pain. If your skin reacts to the spray, stop using it.  ?? Put calamine lotion on the skin. It may help relieve itching.  To avoid tick bites  ?? Avoid ticks:  ?? Learn where ticks are found in your community, and stay away from those areas if possible.  ?? Cover as much of your body as possible when you work or play in grassy or wooded areas.  ?? Use insect repellents, such as products containing DEET. You can spray them on your skin.  ?? Take steps to control ticks on your property if you live in an area where Lyme disease occurs. Clear leaves, brush, tall grasses, woodpiles, and  stone fences from around your house and the edges of your yard or garden. This may help get rid of ticks.  ?? When you come in from outdoors, check your body for ticks, including your groin, head, and underarms. The ticks may be about the size of a sesame seed. If no one else can help you check for ticks on your scalp, comb your hair with a fine-tooth comb.  ?? If you find a tick, remove it quickly. Use tweezers to grasp the tick as close to its mouth (the part in your skin) as possible. Slowly pull the tick straight out???do not twist or yank???until its mouth releases from your skin.  ?? When hiking in the woods, carry a small dry jar or ziplock bag. If you find a tick on your body, remove the tick and put it in the jar or bag. Store the container in the freezer so you can give it to your doctor if symptoms develop. The tick can be tested to learn whether it is carrying the bacteria that cause Lyme disease.  ?? Check your pets for ticks after they have been outdoors.  When should you call for help?  Call 911 anytime you think you may need emergency care. For example, call if:  ?? Your tongue or throat is swelling.  ?? You have breathing problems or wheezing.    Call your doctor now or seek immediate medical care if:  ?? You have signs of infection, such as:  ?? Increased pain, swelling, warmth, or redness around the bite.  ?? Red streaks leading from the bite.  ?? Pus draining from the bite.  ?? A fever.  Watch closely for changes in your health, and be sure to contact your doctor if:  ?? You develop a new rash.  ?? You have joint pain.  ?? You are very tired.  ?? You have flu-like symptoms.  ?? You have symptoms for more than 1 week.   Where can you learn more?   Go to http://www.healthwise.net/[facility]  Enter L087 in the search box to learn more about "Tick Bite: After Your Visit."   ?? 2006-2014 Healthwise[employer] Inc[employer]. Care instructions adapted under license by [employer] (which disclaims liability or warranty for this  information). This care instruction is for use with your licensed healthcare professional. If you have questions about a medical condition or this instruction, always ask your healthcare professional. Healthwise, Incorporated disclaims any warranty or liability for your use of this information.  Content Version: 10.0.270728; Last Revised: December 16, 2010

## 2012-10-30 LAB — AMB POC PT/INR
INR POC: 2.4
Prothrombin time (POC): 28.6 s

## 2012-11-14 LAB — AMB POC PT/INR
INR POC: 2.6
Prothrombin time (POC): 30.6 s

## 2012-11-27 MED ORDER — WARFARIN 2.5 MG TAB
2.5 mg | ORAL_TABLET | ORAL | Status: DC
Start: 2012-11-27 — End: 2013-02-24

## 2012-11-27 MED ORDER — AMLODIPINE 5 MG TAB
5 mg | ORAL_TABLET | ORAL | Status: DC
Start: 2012-11-27 — End: 2013-04-08

## 2012-12-05 NOTE — Progress Notes (Signed)
Chief Complaint   Patient presents with   ??? Back Pain     Lower back pain     Pt. States that he has had the pain for about a month now. No injuries per pt.

## 2012-12-05 NOTE — Progress Notes (Signed)
Subjective:      Patient : This patient is a 79 y.o. male with a chief complaint of back pain. Pain has been going on for 4-5 weeks, left low back with radiation bilaterally to the back of knees. Pt has seen a chiropractor for this and given home exercises. He feels those made him worse. No medications. Pain stopped 2-3 days ago. No bowel or bladder incontinence. No fever, night sweats, or weight changes. No saddle anesthesia. No pain with recumbency.    Review of Systems:  General/Constitutional:  No fever, chills, sweats, fatigue, night sweats, weakness, weight loss or weight gain   Head: No headache, no trauma   Neck: No swelling, masses, stiffness, pain, or limited movement   Cardiac: No chest pain   Respiratory: No cough, shortness of breath, or dyspnea on exertion   GI: No incontinence. No nausea/vomiting, diarrhea, abdominal pain, bloody or dark stools  GU: No incontinence. No change in urinary habits.  Peripheral Vascular: No edema, coldness, numbness, discoloration, pain, or paresthesias   Musculoskeletal: As per HPI  Neurological: No saddle distribution paresthesia. No siatic pain. No loss of consciousness, dizziness, seizures, dysarthria, cognitive changes, problems with balance, or unilateral weakness.    Past Medical History   Diagnosis Date   ??? Paroxysmal atrial fibrillation 07/04/2007   ??? BPH 07/04/2007   ??? Resting tremor 07/04/2007   ??? BCC (basal cell carcinoma of skin) 07/04/2007   ??? Atrial fibrillation    ??? Skin cancer    ??? Hypertension      Family History   Problem Relation Age of Onset   ??? Hypertension Mother    ??? Cancer Father      ?prostate cancer, bone cancer     Current Outpatient Prescriptions   Medication Sig Dispense Refill   ??? amLODIPine (NORVASC) 5 mg tablet TAKE ONE TABLET BY MOUTH DAILY  90 Tab  0   ??? warfarin (COUMADIN) 2.5 mg tablet TAKE 1 TABLET BY MOUTH DAILY AS DIRECTED( 1/2 TABLET ON WEDNESDAYS)  90 Tab  0   ??? digoxin (LANOXIN) 0.125 mg tablet Take 1 Tab by mouth daily.  90 Tab  1    ??? tamsulosin (FLOMAX) 0.4 mg capsule Take 1 Cap by mouth daily.  90 Cap  3   ??? dutasteride (AVODART) 0.5 mg capsule Take 1 Cap by mouth daily.  90 Cap  3   ??? Hydrochlorothiazide 12.5 mg tablet Take 12.5 mg by mouth daily.  90 Tab  3   ??? lovastatin (MEVACOR) 20 mg tablet Take 1 Tab by mouth nightly.  90 Tab  1   ??? omeprazole (PRILOSEC) 20 mg capsule TAKE ONE CAPSULE BY MOUTH DAILY  90 Cap  3   ??? HYDROcodone-acetaminophen (LORTAB) 5-500 mg per tablet Take 1 Tab by mouth every six (6) hours as needed for Pain.  20 Tab  0   ??? traMADol (ULTRAM) 50 mg tablet Take 1 Tab by mouth two (2) times daily as needed for Pain.  20 Tab  0   ??? omega-3 fatty acids-vitamin e (FISH OIL) 1,000 mg cap Take 1 Cap by mouth.       ??? metoprolol-XL (TOPROL XL) 50 mg XL tablet Take 25 mg by mouth two (2) times a day.         No Known Allergies  History     Social History   ??? Marital Status: MARRIED     Spouse Name: N/A     Number of Children: N/A   ???  Years of Education: N/A     Occupational History   ??? Not on file.     Social History Main Topics   ??? Smoking status: Never Smoker    ??? Smokeless tobacco: Never Used   ??? Alcohol Use: 1.5 oz/week     3 Glasses of wine per week      Comment: 3 er week   ??? Drug Use: No   ??? Sexually Active: Not on file     Other Topics Concern   ??? Not on file     Social History Narrative   ??? No narrative on file       Objective:     BP 137/80   Pulse 76   Temp(Src) 97.8 ??F (36.6 ??C) (Oral)   Ht 5\' 10"  (1.778 m)   Wt 185 lb (83.915 kg)   BMI 26.54 kg/m2    General: Alert and oriented and in no acute distress. Responds to all questions appropriately.   LUNGS: Respirations unlabored.  Skin: No obvious rash.  MSK:    Posture: Normal   Deformity: None    ROM:     Flexion: Normal    Extension: Normal     Lateral bending: Normal      Gait: Normal       Palpation:    L1-L5: No tenderness    Sacrum: No tenderness    Coccyx: No tenderness    Left Paraspinal: Minimally tenderness    Right Paraspinal: No tenderness     Strength  (0-5/5)    Hip Flexion:   Left: 5/5  Right: 5/5    Hip Extension:  Left: 5/5  Right: 5/5    Hip Abduction:  Left: 5/5  Right: 5/5    Hip Adduction:  Left: 5/5  Right: 5/5    Knee Extension:  Left: 5/5  Right: 5/5    Knee Flexion:   Left: 5/5  Right: 5/5    Ankle dorsiflexion:  Left: 5/5  Right: 5/5    Ankle plantarflexion:  Left: 5/5  Right: 5/5    Great toe extension:  Left: 5/5  Right: 5/5     Sensation: There is mild decreased sense to vibration bilaterally to the mid-tibia. Light touch, intact.      DTR:    Patella:  Left: +2  Right: +2    Achilles:  Left: +2  Right: +2     Special test:    Straight leg:  Left: Negative  Right: Negative    Fader and Pearlean Brownie???s: Left: Negative  Right: Negative    Piriformis:  Left: Negative  Right: Negative       Imaging - Radiographs of lumbar spine demonstrates likely old compression fracture of T12, no acute change, scoliosis and diffuse degenerative joint disease.       Assessment:       ICD-9-CM    1. Low back pain 724.2 XR SPINE LUMB 2 OR 3 V   .          2. Compression fracture of thoracic vertebra 805.2     T 9  - age undetermined, most likely old.   Low back pain likely multifactorial - paraspinal muscle spasm, DDD, and T12 compression deformity.  Discussed tx options including HEP, PT, ortho eval, and kyphoplasty.  Currently pt is asymptomatic and would like to only do HEP.        Plan:   1. Home Exercise Program as per handout.  Discussed PT and pt would like  to try HEP first. If not improved will try PT.  2. Ice 15 minutes, three times a day PRN    Medications:   1. Naproxin (Aleve): 220mg  1-2 tablets twice a day for 5 days   2. Acetaminophen (Tylenol):  500mg  1-2 tablets every 6 hours as needed for pain.   .    F/U: PRN.

## 2012-12-08 NOTE — Progress Notes (Signed)
CARDIOLOGY OFFICE NOTE    Dalilah Curlin A. Celicia Minahan, MD, [facility]    [address], [place], [place] [address]  Phone [phone]; Fax [phone]  Mobile [phone]   Voice Mail [phone]    No admission date for patient encounter.  Jennye Boroughs, MD    DOB:  [birth year]   MRN:  [id number]     Subjective             Cory Dalton is a 79 y.o. male I am seeing for hypertension, hyperlipidemia and Atrial Fibrillation. Symptoms include: chest pain  Cardiac risk factors: male gender, hypertension, dyslipidemia..I have personally obtained the history from the patient.I have no sen Cory Dalton since 2009. [place] and [place] now back. He appears to be in chronic atrial fibrillation now.   He has no complaints today from a cardiac standpoint.      No Known Allergies      Past Medical History   Diagnosis Date   ??? Paroxysmal atrial fibrillation 07/04/2007   ??? BPH 07/04/2007   ??? Resting tremor 07/04/2007   ??? BCC (basal cell carcinoma of skin) 07/04/2007   ??? Atrial fibrillation    ??? Skin cancer    ??? Hypertension         Past Surgical History   Procedure Laterality Date   ??? Hx hernia repair  04/2010   ??? Hx cataract removal         Home Medications:  Current Outpatient Prescriptions   Medication Sig   ??? amLODIPine (NORVASC) 5 mg tablet TAKE ONE TABLET BY MOUTH DAILY   ??? warfarin (COUMADIN) 2.5 mg tablet TAKE 1 TABLET BY MOUTH DAILY AS DIRECTED( 1/2 TABLET ON WEDNESDAYS)   ??? digoxin (LANOXIN) 0.125 mg tablet Take 1 Tab by mouth daily.   ??? tamsulosin (FLOMAX) 0.4 mg capsule Take 1 Cap by mouth daily.   ??? dutasteride (AVODART) 0.5 mg capsule Take 1 Cap by mouth daily.   ??? Hydrochlorothiazide 12.5 mg tablet Take 12.5 mg by mouth daily.   ??? lovastatin (MEVACOR) 20 mg tablet Take 1 Tab by mouth nightly.   ??? omeprazole (PRILOSEC) 20 mg capsule TAKE ONE CAPSULE BY MOUTH DAILY   ??? HYDROcodone-acetaminophen (LORTAB) 5-500 mg per tablet Take 1 Tab by mouth every six (6) hours as needed for Pain.   ???  traMADol (ULTRAM) 50 mg tablet Take 1 Tab by mouth two (2) times daily as needed for Pain.   ??? omega-3 fatty acids-vitamin e (FISH OIL) 1,000 mg cap Take 1 Cap by mouth.   ??? metoprolol-XL (TOPROL XL) 50 mg XL tablet Take 25 mg by mouth two (2) times a day.     No current facility-administered medications for this visit.             Objective:   Laboratory and Imaging have been reviewed by me and are notable for        Lab Results   Component Value Date/Time    INR POC 2.6 11/14/2012  9:05 AM    INR POC 2.4 10/30/2012  9:05 AM    INR POC 3.4 10/23/2012  9:15 AM    INR 1.9 07/17/2012  8:31 AM    INR 2.9 04/29/2012 10:42 PM    Prothrombin time 20.4 07/17/2012  8:31 AM    Prothrombin time 29.0 04/29/2012 10:42 PM               Cardiac work up to date:  PROCEDURES                                           DATE       1.  Echocardiogram                 (04/13/07)  EF 55-60%, BAE, Mild TR with    12/2103echocardiogram:  SUMMARY:  Left ventricle: Systolic function was normal. Ejection fraction was  estimated to be 65 %. There were no regional wall motion abnormalities.    Right ventricle: The ventricle was mildly to moderately dilated. Systolic  function was mildly reduced.    Left atrium: The atrium was markedly dilated.    Atrial septum: The septum bows from right to left, consistent with  increased right atrial pressure.    Right atrium: The atrium was moderately to markedly dilated.    Mitral valve: There was mild to moderate regurgitation.    Tricuspid valve: There was severe regurgitation. There was moderate to  severe pulmonary hypertension.    Inferior vena cava, hepatic veins: The inferior vena cava was mildly  dilated. Respirophasic changes were blunted (less than 50% variation).       2. Cholesterol profile                         Lab Results   Component Value Date/Time    Cholesterol, total 141 07/17/2012  8:31 AM    HDL Cholesterol 62 07/17/2012  8:31 AM    LDL, calculated 72 07/17/2012  8:31 AM    VLDL, calculated 7  07/17/2012  8:31 AM    Triglyceride 34 07/17/2012  8:31 AM          Social History:  History   Substance Use Topics   ??? Smoking status: Never Smoker    ??? Smokeless tobacco: Never Used   ??? Alcohol Use: 1.5 oz/week     3 Glasses of wine per week      Comment: 3 er week       Family History:  Family History   Problem Relation Age of Onset   ??? Hypertension Mother    ??? Cancer Father      ?prostate cancer, bone cancer       Review of Symptoms:  A comprehensive review of systems ,particularly related to cardiovascular system, was negative except for that written in the HPI.    Physical Exam:    BP 144/78   Pulse 82   Ht 5\' 10"  (1.778 m)   Wt 183 lb (83.008 kg)   BMI 26.26 kg/m2    Physical Exam:  Gen: Well-developed, well-nourished, in no acute distress  alert and oriented x 3  HEENT:  Pink conjunctivae, Hearing grossly normal.No scleral icterus or conjunctival, moist mucous membranes  Neck: Supple,No JVD, No Carotid Bruit, Thyroid- non tender No cervical lymphadenopathy  Resp: No accessory muscle use, Clear breath sounds, No rales or rhonchi  Card: Irregular Rate,Rythm,Normal S1, S2, 1/6 systolic murmurs, rubs or gallop. No thrills.     MSK: No cyanosis or clubbing, good capillary refill  Skin: No rashes or ulcers, no bruising  Neuro:  Cranial nerves are grossly intact, moving all four extremities, no focal deficit, follows commands appropriately  Psych:  Good insight, oriented to person, place and time, alert, Nml Affect  LE: <1+ edema  Vascular: Distal Pulses  2+ and symmetric        Medication change on this office visit: none    Assessment:   ?? Atrial fibrillation  ?? Edema possibly related to Norvasc he was started on  ?? dyslipidemia   PLAN/discussion:   ?? Stable however has significant edema  ?? LV function is good but he as mod to severe TR and does not want sleep study  ?? Cholesterol is at goal  ??    ?? I have extensively reviewed all testing with the patient.    I have discussed the diagnosis with the patient and the  intended plan as seen in the above orders.  Questions were answered concerning future plans.  I have discussed medication side effects and warnings with the patient as well.    Cory Dalton is in agreement to the plan listed above and wishes to proceed.     he  was instructed not to smoke, eat heart healthy diet  and to exercise.     Thank you for this consult.    Rushie Chestnut, MD

## 2012-12-19 LAB — AMB POC PT/INR
INR POC: 3.1
Prothrombin time (POC): 37.3 s

## 2012-12-25 MED ORDER — LOVASTATIN 20 MG TAB
20 mg | ORAL_TABLET | Freq: Every evening | ORAL | Status: DC
Start: 2012-12-25 — End: 2013-07-26

## 2012-12-25 NOTE — Telephone Encounter (Signed)
From: Seleta Rhymes  To: Jennye Boroughs, MD  Sent: 12/25/2012 3:51 PM EDT  Subject: Medication Renewal Request    Original authorizing provider: Jennye Boroughs, MD    Seleta Rhymes would like a refill of the following medications:  lovastatin (MEVACOR) 20 mg tablet Jennye Boroughs, MD]    Preferred pharmacy: [id number] - [place], [place] - [address]    Comment:

## 2013-01-02 LAB — AMB POC PT/INR
INR POC: 2
Prothrombin time (POC): 23.6 s

## 2013-01-15 LAB — AMB POC PT/INR
INR POC: 2
Prothrombin time (POC): 24 s

## 2013-01-29 LAB — AMB POC PT/INR
INR POC: 2.1
Prothrombin time (POC): 24.7 s

## 2013-01-29 NOTE — Progress Notes (Signed)
Quick Note:    At goal showing stability. Will continue current dose and recheck in 4 weeks.  ______

## 2013-02-26 MED ORDER — WARFARIN 2.5 MG TAB
2.5 mg | ORAL_TABLET | ORAL | Status: DC
Start: 2013-02-26 — End: 2013-06-01

## 2013-02-27 LAB — AMB POC PT/INR
INR POC: 2.4
Prothrombin time (POC): 28.5 s

## 2013-04-02 LAB — AMB POC PT/INR
INR POC: 2.5
Prothrombin time (POC): 30.5 s

## 2013-04-09 MED ORDER — AMLODIPINE 5 MG TAB
5 mg | ORAL_TABLET | ORAL | Status: DC
Start: 2013-04-09 — End: 2013-07-26

## 2013-04-16 MED ORDER — DIGOX 125 MCG (0.125 MG) TABLET
125 mcg (0. mg) | ORAL_TABLET | ORAL | Status: DC
Start: 2013-04-16 — End: 2013-07-30

## 2013-04-21 MED ORDER — FUROSEMIDE 20 MG TAB
20 mg | ORAL_TABLET | ORAL | Status: DC
Start: 2013-04-21 — End: 2013-04-21

## 2013-04-21 MED ORDER — CLOBETASOL 0.05 % TOPICAL SOLN
0.05 % | Status: DC
Start: 2013-04-21 — End: 2013-12-21

## 2013-04-21 MED ORDER — TRIAMTERENE-HYDROCHLOROTHIAZIDE 37.5 MG-25 MG TAB
ORAL_TABLET | ORAL | Status: DC
Start: 2013-04-21 — End: 2013-07-30

## 2013-04-21 MED ORDER — FUROSEMIDE 20 MG TAB
20 mg | ORAL_TABLET | ORAL | Status: DC
Start: 2013-04-21 — End: 2013-06-12

## 2013-04-21 MED ORDER — HYDROCORTISONE-ACETIC ACID 1 %-2 % EAR DROPS
1-2 % | Freq: Three times a day (TID) | OTIC | Status: DC
Start: 2013-04-21 — End: 2013-04-21

## 2013-04-21 MED ORDER — TRIAMTERENE-HYDROCHLOROTHIAZIDE 37.5 MG-25 MG TAB
ORAL_TABLET | Freq: Every day | ORAL | Status: DC
Start: 2013-04-21 — End: 2013-04-21

## 2013-04-21 NOTE — Progress Notes (Signed)
Cory Dalton is a 79 y.o.  male  Issues discussed today include:    Follow up of chronic conditions, see diagnoses    Patient wife is dying.  We spent time talking about grief.    Ears hurt:  +ceruminosis.  We flushed ears.  He has some eczema.  Will try Vosol HC     Pedal edema.  Also BP up.  Will switch hydrochlorothiazide to maxzide and sparingly use lasix      Data reviewed or ordered today:  BMP soon    Other problems include:  Patient Active Problem List   Diagnosis Code   ??? Paroxysmal atrial fibrillation 427.31   ??? BCC (basal cell carcinoma of skin) 173.91   ??? Resting tremor 781.0   ??? BPH (benign prostatic hyperplasia) 600.90   ??? HTN (hypertension) 401.9   ??? S/P colonoscopy V45.89   ??? H/O knee surgery V45.89   ??? Hernia 553.9   ??? Abnormal CT of the abdomen 793.6   ??? Lipid disorder 272.9       Medications:  Current Outpatient Prescriptions   Medication Sig Dispense Refill   ??? triamterene-hydrochlorothiazide (MAXZIDE) 37.5-25 mg per tablet Take 1 tablet by mouth daily.  30 tablet  3   ??? furosemide (LASIX) 20 mg tablet Take one pill on the days you really have fluid, not daily  10 tablet  1   ??? clobetasol (TEMOVATE) 0.05 % external solution Apply to skin irritation once daily  1 Bottle  1   ??? DIGOX 125 mcg tablet TAKE 1 TABLET BY MOUTH DAILY  90 tablet  0   ??? amLODIPine (NORVASC) 5 mg tablet TAKE 1 TABLET BY MOUTH DAILY  90 tablet  0   ??? warfarin (COUMADIN) 2.5 mg tablet TAKE 1 TABLET BY MOUTH EVERY DAY(EXCEPT ON WEDNESDAYS TAKE 1/2 TABLET)  90 tablet  0   ??? lovastatin (MEVACOR) 20 mg tablet Take 1 Tab by mouth nightly.  90 Tab  1   ??? tamsulosin (FLOMAX) 0.4 mg capsule Take 1 Cap by mouth daily.  90 Cap  3   ??? dutasteride (AVODART) 0.5 mg capsule Take 1 Cap by mouth daily.  90 Cap  3   ??? traMADol (ULTRAM) 50 mg tablet Take 1 Tab by mouth two (2) times daily as needed for Pain.  20 Tab  0   ??? omega-3 fatty acids-vitamin e (FISH OIL) 1,000 mg cap Take 1 Cap by mouth.       ??? metoprolol-XL (TOPROL XL) 50 mg XL  tablet Take 25 mg by mouth two (2) times a day.           Allergies:  No Known Allergies    LMP:  No LMP for male patient.    History     Social History   ??? Marital Status: MARRIED     Spouse Name: N/A     Number of Children: N/A   ??? Years of Education: N/A     Occupational History   ??? Not on file.     Social History Main Topics   ??? Smoking status: Never Smoker    ??? Smokeless tobacco: Never Used   ??? Alcohol Use: 1.5 oz/week     3 Glasses of wine per week      Comment: 3 er week   ??? Drug Use: No   ??? Sexually Active: Not on file     Other Topics Concern   ??? Not on file  Social History Narrative   ??? No narrative on file         Family History   Problem Relation Age of Onset   ??? Hypertension Mother    ??? Cancer Father      ?prostate cancer, bone cancer       Meaningful use:  done    ROS:  Emotional/grief.  +pedal edema  Physical Exam:  BP 156/100   Pulse 80   Temp(Src) 94.6 ??F (34.8 ??C) (Oral)   Resp 16   Ht 5\' 10"  (1.778 m)   Wt 196 lb 12.8 oz (89.268 kg)   BMI 28.24 kg/m2   SpO2 98%    BP Readings from Last 3 Encounters:   04/21/13 156/100   12/08/12 144/78   12/05/12 137/80     Some eczema in ears.  +ceruminosis    +1 pedal edema    Assessment:    Patient Active Problem List   Diagnosis Code   ??? Paroxysmal atrial fibrillation 427.31   ??? BCC (basal cell carcinoma of skin) 173.91   ??? Resting tremor 781.0   ??? BPH (benign prostatic hyperplasia) 600.90   ??? HTN (hypertension) 401.9   ??? S/P colonoscopy V45.89   ??? H/O knee surgery V45.89   ??? Hernia 553.9   ??? Abnormal CT of the abdomen 793.6   ??? Lipid disorder 272.9       Today's diagnoses are:    ICD-9-CM    1. Grief 309.0    2. Need for influenza vaccination V04.81 INFLUENZA VIRUS VAC, SPLIT, PRES. FREE, ENHNCD IMMUNOGEN VIA INCRSD ANTIGEN CONTENT, IM     ADMIN INFLUENZA VIRUS VAC   3. Ceruminosis, bilateral 380.4     instrumented by MD   4. Eczema 692.9 clobetasol (TEMOVATE) 0.05 % external solution     DISCONTINUED: hydrocortisone-acetic acid (VOSOL-HC) otic solution    5. HTN (hypertension) 401.9 triamterene-hydrochlorothiazide (MAXZIDE) 37.5-25 mg per tablet     AMB POC URINE, MICROALBUMIN, SEMIQUANT (3 RESULTS)     CBC W/O DIFF     METABOLIC PANEL, COMPREHENSIVE   6. Edema 782.3 furosemide (LASIX) 20 mg tablet   7. Lipid disorder 272.9 LIPID PANEL   8. Impacted cerumen, bilateral 380.4 PR REMOVE IMPACTED EAR WAX       Plan:  Orders Placed This Encounter   ??? Influenza virus vaccine (FLUZONE HIGH-DOSE) 65 years and older   ??? CBC W/O DIFF   ??? LIPID PANEL   ??? METABOLIC PANEL, COMPREHENSIVE   ??? AMB POC URINE, MICROALBUMIN, SEMIQUANT (3 RESULTS)   ??? Administration fee [phone]) for Medicare insured patients   ??? PR REMOVE IMPACTED EAR WAX   ??? triamterene-hydrochlorothiazide (MAXZIDE) 37.5-25 mg per tablet     Sig: Take 1 tablet by mouth daily.     Dispense:  30 tablet     Refill:  3   ??? DISCONTD: hydrocortisone-acetic acid (VOSOL-HC) otic solution     Sig: Administer 3 drops into each ear three (3) times daily for 10 days.     Dispense:  1 Bottle     Refill:  3   ??? furosemide (LASIX) 20 mg tablet     Sig: Take one pill on the days you really have fluid, not daily     Dispense:  10 tablet     Refill:  1   ??? clobetasol (TEMOVATE) 0.05 % external solution     Sig: Apply to skin irritation once daily     Dispense:  1 Bottle  Refill:  1       See patient instructions  Patient Instructions   Lean on those around you    Follow up anytime you need to talk    For swelling, use the furosemide sparingly (not every day)    Continue amlodipine    Switch hydrochlorothiazide to maxzide  (traimterene/hydrochlorothiazide)    Recheck BP soon    Fasting labs soon    For ears, wash ears with Selsun Blue shampoo    Use Vosol HC drops to soothe ears          refresh note:  done    AVS Printed:  done      The majority of today's visit was counseling or coordination of care 25 minutes.     Total face to face time:  25  Time spent counseling:  20  Subject:  See diagnoses.

## 2013-04-21 NOTE — Progress Notes (Signed)
Cory Dalton is a 79 y.o. male who presents for routine immunizations.   He denies any symptoms , reactions or allergies that would exclude them from being immunized today.  Risks and adverse reactions were discussed and the VIS was given to them. All questions were addressed.  He was observed for 15 min post injection. There were no reactions observed.    Corine Shelter, LPN

## 2013-04-21 NOTE — Patient Instructions (Addendum)
Lean on those around you    Follow up anytime you need to talk    For swelling, use the furosemide sparingly (not every day)    Continue amlodipine    Switch hydrochlorothiazide to maxzide  (traimterene/hydrochlorothiazide)    Recheck BP soon    Fasting labs soon    For ears, wash ears with Selsun Blue shampoo    Use Vosol HC drops to soothe ears

## 2013-04-21 NOTE — Telephone Encounter (Signed)
Called pharmacy they do not have the medication that was prescribed.They stated they don't have anything with the same ingredients because they haven't seen it used in forever.They do have products with hydrocortisone but states it would be up to the physician to choose something comparable

## 2013-04-21 NOTE — Telephone Encounter (Signed)
Per fax from the pharmacy, they are currently out of stock of the Hydrocort ear solution. Would you like to prescribe something else?

## 2013-05-01 LAB — AMB POC URINE, MICROALBUMIN, SEMIQUANT (3 RESULTS)
ALBUMIN, URINE POC: 10 mg/L
CREATININE, URINE POC: 100 mg/dL
Microalbumin/creat ratio (POC): <30 mg/g

## 2013-05-01 LAB — AMB POC PT/INR
INR POC: 2
Prothrombin time (POC): 23.9 s

## 2013-05-02 LAB — METABOLIC PANEL, COMPREHENSIVE
A-G Ratio: 1.9 (ref 1.1–2.5)
ALT (SGPT): 14 IU/L (ref 0–44)
AST (SGOT): 21 IU/L (ref 0–40)
Albumin: 3.9 g/dL (ref 3.5–4.7)
Alk. phosphatase: 78 IU/L (ref 39–117)
BUN/Creatinine ratio: 15 (ref 10–22)
BUN: 12 mg/dL (ref 8–27)
Bilirubin, total: 0.6 mg/dL (ref 0.0–1.2)
CO2: 24 mmol/L (ref 18–29)
Calcium: 9.2 mg/dL (ref 8.6–10.2)
Chloride: 89 mmol/L — ABNORMAL LOW (ref 97–108)
Creatinine: 0.81 mg/dL (ref 0.76–1.27)
GFR est AA: 94 mL/min/{1.73_m2} (ref >59)
GFR est non-AA: 82 mL/min/{1.73_m2} (ref >59)
GLOBULIN, TOTAL: 2.1 g/dL (ref 1.5–4.5)
Glucose: 91 mg/dL (ref 65–99)
Potassium: 4.7 mmol/L (ref 3.5–5.2)
Protein, total: 6 g/dL (ref 6.0–8.5)
Sodium: 128 mmol/L — ABNORMAL LOW (ref 134–144)

## 2013-05-02 LAB — CBC W/O DIFF
HCT: 36.5 % — ABNORMAL LOW (ref 37.5–51.0)
HGB: 12.3 g/dL — ABNORMAL LOW (ref 12.6–17.7)
MCH: 32.4 pg (ref 26.6–33.0)
MCHC: 33.7 g/dL (ref 31.5–35.7)
MCV: 96 fL (ref 79–97)
PLATELET: 207 10*3/uL (ref 150–379)
RBC: 3.8 x10E6/uL — ABNORMAL LOW (ref 4.14–5.80)
RDW: 14.3 % (ref 12.3–15.4)
WBC: 5.7 10*3/uL (ref 3.4–10.8)

## 2013-05-02 LAB — LIPID PANEL
Cholesterol, total: 146 mg/dL (ref 100–199)
HDL Cholesterol: 80 mg/dL (ref >39)
LDL, calculated: 58 mg/dL (ref 0–99)
Triglyceride: 39 mg/dL (ref 0–149)
VLDL, calculated: 8 mg/dL (ref 5–40)

## 2013-05-02 LAB — CVD REPORT: PDF IMAGE: 0

## 2013-05-02 NOTE — Progress Notes (Signed)
Quick Note:    Your labs are back. Please follow up to discuss.  ______

## 2013-05-30 ENCOUNTER — Encounter

## 2013-05-30 NOTE — Telephone Encounter (Signed)
Referral placed for [facility] per pt request

## 2013-05-30 NOTE — Telephone Encounter (Signed)
Patient returned call regarding referral for PT.

## 2013-05-31 DIAGNOSIS — I4891 Unspecified atrial fibrillation: Secondary | ICD-10-CM | POA: Diagnosis not present

## 2013-05-31 LAB — AMB POC PT/INR
INR POC: 2
Prothrombin time (POC): 24.3 s

## 2013-06-04 MED ORDER — WARFARIN 2.5 MG TAB
2.5 mg | ORAL_TABLET | ORAL | Status: DC
Start: 2013-06-04 — End: 2013-06-12

## 2013-06-04 NOTE — Telephone Encounter (Signed)
Per call from [facility] ( next door)- RX needed for physical therapy. Patient have appt tomorrow at 1:30 pm.    Fax [phone]  Ph# [phone]      thanks

## 2013-06-04 NOTE — Telephone Encounter (Signed)
Order given to [facility] for PT

## 2013-06-05 DIAGNOSIS — R269 Unspecified abnormalities of gait and mobility: Secondary | ICD-10-CM | POA: Diagnosis not present

## 2013-06-05 DIAGNOSIS — M5380 Other specified dorsopathies, site unspecified: Secondary | ICD-10-CM | POA: Diagnosis not present

## 2013-06-05 DIAGNOSIS — M545 Low back pain, unspecified: Secondary | ICD-10-CM | POA: Diagnosis not present

## 2013-06-05 DIAGNOSIS — M6281 Muscle weakness (generalized): Secondary | ICD-10-CM | POA: Diagnosis not present

## 2013-06-12 DIAGNOSIS — S32009A Unspecified fracture of unspecified lumbar vertebra, initial encounter for closed fracture: Secondary | ICD-10-CM | POA: Diagnosis not present

## 2013-06-12 DIAGNOSIS — M545 Low back pain, unspecified: Secondary | ICD-10-CM | POA: Diagnosis not present

## 2013-06-12 DIAGNOSIS — R079 Chest pain, unspecified: Secondary | ICD-10-CM | POA: Diagnosis not present

## 2013-06-12 DIAGNOSIS — Z7901 Long term (current) use of anticoagulants: Secondary | ICD-10-CM | POA: Diagnosis not present

## 2013-06-12 DIAGNOSIS — R52 Pain, unspecified: Secondary | ICD-10-CM | POA: Diagnosis not present

## 2013-06-12 DIAGNOSIS — IMO0002 Reserved for concepts with insufficient information to code with codable children: Secondary | ICD-10-CM | POA: Diagnosis not present

## 2013-06-12 DIAGNOSIS — M549 Dorsalgia, unspecified: Secondary | ICD-10-CM | POA: Diagnosis not present

## 2013-06-12 DIAGNOSIS — M5126 Other intervertebral disc displacement, lumbar region: Secondary | ICD-10-CM | POA: Diagnosis not present

## 2013-06-12 DIAGNOSIS — M129 Arthropathy, unspecified: Secondary | ICD-10-CM | POA: Diagnosis not present

## 2013-06-12 DIAGNOSIS — Z23 Encounter for immunization: Secondary | ICD-10-CM | POA: Diagnosis not present

## 2013-06-12 DIAGNOSIS — Z79899 Other long term (current) drug therapy: Secondary | ICD-10-CM | POA: Diagnosis not present

## 2013-06-12 DIAGNOSIS — I10 Essential (primary) hypertension: Secondary | ICD-10-CM | POA: Diagnosis not present

## 2013-06-12 NOTE — Other (Signed)
TRANSFER - OUT REPORT:    Verbal report given to Vernita, RN(name) on Seleta RhymesJean O Sugarman  being transferred to 4th floor(unit) for routine progression of care       Report consisted of patient???s Situation, Background, Assessment and   Recommendations(SBAR).     Information from the following report(s) SBAR, ED Summary and MAR was reviewed with the receiving nurse.    Opportunity for questions and clarification was provided.

## 2013-06-12 NOTE — ED Notes (Signed)
Assumed care of patient.  Acknowledged patient, introduced myself as primary RN.  Process explained to patient, including tests ordered and wait times.  Discussed the patient's expectations for this visit, addressed any concerns and answered questions.  Stretcher in low, locked position and call bell within reach.

## 2013-06-12 NOTE — Consults (Signed)
ORTHOPAEDIC FRACTURE CONSULT NOTE    Subjective:     Date of Consultation:  June 12, 2013      Cory Dalton is a 80 y.o. male who is being seen for severe lumbar pain with left leg radiculopathy. Pt. Has had chronic back pain x 1 year, recently saw Dr. Mancel Bale who initiated formalized PT. He started do therapy exercises last Tuesday, awoke last Wednesday and had severe lumbar pain with left leg numbness. Pain progressively worsened to the point he presented to ER tonight with intractable pain rating it a "20" out of 1-10.  He was given IV steroid, Morphine and Zofran once in ER, pain has significantly improved to a "4". Pt. Denies urinary incontinence, bowel incontinence. No use of assistive devices for ambulation. When he was at home, he used his wife's pain medication with minimal relief. CT scan done showing concern for L3-4 disk herniation with extrusion on L4 nerve root. Pt has been admitted by [facility] practice.     Patient Active Problem List    Diagnosis Date Noted   ??? Anemia 05/02/2013   ??? Hyponatremia 05/02/2013   ??? Lipid disorder 06/26/2012   ??? Abnormal CT of the abdomen 05/02/2012   ??? BPH (benign prostatic hyperplasia) 10/12/2011   ??? HTN (hypertension) 10/12/2011   ??? S/P colonoscopy 10/12/2011   ??? H/O knee surgery 10/12/2011   ??? Hernia 10/12/2011   ??? Paroxysmal atrial fibrillation 07/04/2007   ??? BCC (basal cell carcinoma of skin) 07/04/2007   ??? Resting tremor 07/04/2007     Family History   Problem Relation Age of Onset   ??? Hypertension Mother    ??? Cancer Father      ?prostate cancer, bone cancer      History   Substance Use Topics   ??? Smoking status: Never Smoker    ??? Smokeless tobacco: Never Used   ??? Alcohol Use: 1.5 oz/week     3 Glasses of wine per week      Comment: 3 er week     Past Medical History   Diagnosis Date   ??? Paroxysmal atrial fibrillation 07/04/2007   ??? BPH 07/04/2007   ??? Resting tremor 07/04/2007   ??? BCC (basal cell carcinoma of skin) 07/04/2007   ??? Atrial fibrillation    ??? Skin cancer     ??? Hypertension       Past Surgical History   Procedure Laterality Date   ??? Hx hernia repair  04/2010   ??? Hx cataract removal        Prior to Admission medications    Medication Sig Start Date End Date Taking? Authorizing Provider   warfarin (COUMADIN) 2.5 mg tablet TAKE 1 TABLET BY MOUTH DAILY EXCEPT ON WEDNESDAYS TAKE 1/2 TABLET 06/01/13   Raynelle Chary, MD   triamterene-hydrochlorothiazide (MAXZIDE) 37.5-25 mg per tablet TAKE 1 TABLET BY MOUTH EVERY DAY 04/21/13   Raynelle Chary, MD   furosemide (LASIX) 20 mg tablet TAKE 1 TABLET BY MOUTH ON THE DAYS YOU REALLY HAVE FLUID. DO NOT USE DAILY. 04/21/13   Raynelle Chary, MD   clobetasol (TEMOVATE) 0.05 % external solution Apply to skin irritation once daily 04/21/13   Raynelle Chary, MD   DIGOX 125 mcg tablet TAKE 1 TABLET BY MOUTH DAILY 04/14/13   Raynelle Chary, MD   amLODIPine (NORVASC) 5 mg tablet TAKE 1 TABLET BY MOUTH DAILY 04/08/13   Raynelle Chary, MD   lovastatin (MEVACOR) 20 mg tablet  Take 1 Tab by mouth nightly. 12/25/12   Raynelle Chary, MD   tamsulosin (FLOMAX) 0.4 mg capsule Take 1 Cap by mouth daily. 10/09/12   Raynelle Chary, MD   dutasteride (AVODART) 0.5 mg capsule Take 1 Cap by mouth daily. 08/21/12   Raynelle Chary, MD   traMADol Veatrice Bourbon) 50 mg tablet Take 1 Tab by mouth two (2) times daily as needed for Pain. 04/28/12   Cheral Bay, MD   omega-3 fatty acids-vitamin e (FISH OIL) 1,000 mg cap Take 1 Cap by mouth.    Historical Provider   metoprolol-XL (TOPROL XL) 50 mg XL tablet Take 25 mg by mouth two (2) times a day.    Historical Provider     Current Facility-Administered Medications   Medication Dose Route Frequency   ??? [COMPLETED] morphine injection 4 mg  4 mg IntraVENous ONCE   ??? [COMPLETED] ondansetron (ZOFRAN) injection 4 mg  4 mg IntraVENous NOW   ??? [COMPLETED] methylPREDNISolone (PF) (SOLU-MEDROL) injection 125 mg  125 mg IntraVENous NOW     Current Outpatient Prescriptions   Medication Sig   ??? warfarin (COUMADIN) 2.5 mg tablet TAKE 1 TABLET  BY MOUTH DAILY EXCEPT ON WEDNESDAYS TAKE 1/2 TABLET   ??? triamterene-hydrochlorothiazide (MAXZIDE) 37.5-25 mg per tablet TAKE 1 TABLET BY MOUTH EVERY DAY   ??? furosemide (LASIX) 20 mg tablet TAKE 1 TABLET BY MOUTH ON THE DAYS YOU REALLY HAVE FLUID. DO NOT USE DAILY.   ??? clobetasol (TEMOVATE) 0.05 % external solution Apply to skin irritation once daily   ??? DIGOX 125 mcg tablet TAKE 1 TABLET BY MOUTH DAILY   ??? amLODIPine (NORVASC) 5 mg tablet TAKE 1 TABLET BY MOUTH DAILY   ??? lovastatin (MEVACOR) 20 mg tablet Take 1 Tab by mouth nightly.   ??? tamsulosin (FLOMAX) 0.4 mg capsule Take 1 Cap by mouth daily.   ??? dutasteride (AVODART) 0.5 mg capsule Take 1 Cap by mouth daily.   ??? traMADol (ULTRAM) 50 mg tablet Take 1 Tab by mouth two (2) times daily as needed for Pain.   ??? omega-3 fatty acids-vitamin e (FISH OIL) 1,000 mg cap Take 1 Cap by mouth.   ??? metoprolol-XL (TOPROL XL) 50 mg XL tablet Take 25 mg by mouth two (2) times a day.      No Known Allergies     Review of Systems:  A comprehensive review of systems was negative except for that written in the HPI.        Objective:     Patient Vitals for the past 8 hrs:   BP Temp Pulse Resp SpO2 Height Weight   06/12/13 1956 140/92 mmHg - 78 18 98 % - -   06/12/13 1732 137/98 mmHg 97.8 ??F (36.6 ??C) 81 16 97 % '5\' 11"'  (1.803 m) 86.183 kg (190 lb)     Temp (24hrs), Avg:97.8 ??F (36.6 ??C), Min:97.8 ??F (36.6 ??C), Max:97.8 ??F (36.6 ??C)      Gen: Well-developed,  in no acute distress   Musc: No pain to palpation at lumbar spine. Moderate sized superficial cyst noted at lumbar spine. No redness/ inflammation. Bilateral lower extremities with 5/5 strength, DF, PF, EHL, FHL. Positive SLR on left. PT +2.   Skin: No skin breakdown noted. Skin warm, pink, dry  Neuro: Cranial nerves are grossly intact, no focal motor weakness, follows commands appropriately   Psych: Good insight, oriented to person, place and time, alert    Imaging Review: REPORT:   EXAM:  CT LUMBAR SPINE WITHOUT CONTRAST    INDICATION: trauma   COMPARISON: Radiographs of 12/05/2012.   TECHNIQUE: Multislice helical CT of the lumbar spine was performed without   intravenous contrast administration. Coronal and sagittal reconstructions   were generated.   FINDINGS:   There is diffuse osteoporosis. There is mild anterior compression fracture   of L2 which is unchanged since 12/05/2012. The remainder of the lumbar   vertebrae demonstrate no significant compression.   The alignment is within normal limits. No acute fracture is demonstrated.   Posterior elements are intact. The paravertebral soft tissues are within   normal limits.   Lower thoracic spine: The spinal canal and neural foramina are widely   patent.   L1-L2: Moderate spondylosis and disc bulge. Bilateral facet hypertrophy.   Moderate central spinal stenosis.Marland Kitchen   L2-L3: Spondylosis and loss of height of the disc. Mild facet hypertrophy.   No significant central stenosis.Marland Kitchen   L3-L4: Moderately severe spondylosis, asymmetric to the left. Mild to   moderate facet hypertrophy. Mild central spinal stenosis. Bilateral   foraminal stenosis, left greater than right. Probable left central disc   herniation with caudal extrusion of disc material which extends to the left   lateral recess of L4, with impingement of the left L4 nerve root.   L4-L5: Spondylosis with asymmetry to the left. Bilateral facet and   ligamentous hypertrophy with moderately severe central spinal stenosis. Left   foraminal stenosis.Marland Kitchen   L5-S1: Mild/moderate bulging disc. Bilateral facet hypertrophy. No   significant central or foraminal stenosis..   IMPRESSION:   1. Mild chronic compression fracture of L2. No acute fractures demonstrated.   Diffuse osteoporosis.   2. Multilevel degenerative changes as described in detail above. Spinal   stenosis at L4-5.   3. Suspect left L3-4 disc herniation with caudal extrusion of disc material   impinging the left L4 nerve root in the left lateral recess.        Labs:   Recent Results  (from the past 24 hour(s))   CBC WITH AUTOMATED DIFF    Collection Time     06/12/13  7:42 PM       Result Value Range    WBC 9.1  4.1 - 11.1 K/uL    RBC 4.21  4.10 - 5.70 M/uL    HGB 13.6  12.1 - 17.0 g/dL    HCT 39.4  36.6 - 50.3 %    MCV 93.6  80.0 - 99.0 FL    MCH 32.3  26.0 - 34.0 PG    MCHC 34.5  30.0 - 36.5 g/dL    RDW 13.8  11.5 - 14.5 %    PLATELET 187  150 - 400 K/uL    NEUTROPHILS 66  32 - 75 %    LYMPHOCYTES 25  12 - 49 %    MONOCYTES 8  5 - 13 %    EOSINOPHILS 1  0 - 7 %    BASOPHILS 0  0 - 1 %    ABS. NEUTROPHILS 6.0  1.8 - 8.0 K/UL    ABS. LYMPHOCYTES 2.2  0.8 - 3.5 K/UL    ABS. MONOCYTES 0.7  0.0 - 1.0 K/UL    ABS. EOSINOPHILS 0.1  0.0 - 0.4 K/UL    ABS. BASOPHILS 0.0  0.0 - 0.1 K/UL   METABOLIC PANEL, COMPREHENSIVE    Collection Time     06/12/13  7:42 PM       Result Value Range  Sodium 137  136 - 145 mmol/L    Potassium 3.9  3.5 - 5.1 mmol/L    Chloride 101  97 - 108 mmol/L    CO2 25  21 - 32 mmol/L    Anion gap 11  5 - 15 mmol/L    Glucose 85  65 - 100 mg/dL    BUN 20  6 - 20 MG/DL    Creatinine 0.68  0.45 - 1.15 MG/DL    BUN/Creatinine ratio 29 (*) 12 - 20      GFR est AA >60  >60 ml/min/1.40m    GFR est non-AA >60  >60 ml/min/1.780m   Calcium 9.1  8.5 - 10.1 MG/DL    Bilirubin, total 0.7  0.2 - 1.0 MG/DL    ALT 22  12 - 78 U/L    AST 24  15 - 37 U/L    Alk. phosphatase 74  45 - 117 U/L    Protein, total 6.9  6.4 - 8.2 g/dL    Albumin 3.3 (*) 3.5 - 5.0 g/dL    Globulin 3.6  2.0 - 4.0 g/dL    A-G Ratio 0.9 (*) 1.1 - 2.2     URINALYSIS W/ REFLEX CULTURE    Collection Time     06/12/13  7:42 PM       Result Value Range    Color YELLOW/STRAW      Appearance CLEAR  CLEAR      Specific gravity 1.022  1.003 - 1.030      pH (UA) 7.0  5.0 - 8.0      Protein NEGATIVE   NEG mg/dL    Glucose NEGATIVE   NEG mg/dL    Ketone NEGATIVE   NEG mg/dL    Bilirubin NEGATIVE   NEG      Blood NEGATIVE   NEG      Urobilinogen 1.0  0.2 - 1.0 EU/dL    Nitrites NEGATIVE   NEG      Leukocyte Esterase NEGATIVE   NEG       UA:UC IF INDICATED CULTURE NOT INDICATED BY UA RESULT  CNI      WBC 0-4  0 - 4 /hpf    RBC 0-5  0 - 5 /hpf    Epithelial cells FEW  FEW /lpf    Bacteria NEGATIVE   NEG /hpf    Hyaline Cast 0-2  0 - 2         Impression:     Patient Active Problem List    Diagnosis Date Noted   ??? Anemia 05/02/2013   ??? Hyponatremia 05/02/2013   ??? Lipid disorder 06/26/2012   ??? Abnormal CT of the abdomen 05/02/2012   ??? BPH (benign prostatic hyperplasia) 10/12/2011   ??? HTN (hypertension) 10/12/2011   ??? S/P colonoscopy 10/12/2011   ??? H/O knee surgery 10/12/2011   ??? Hernia 10/12/2011   ??? Paroxysmal atrial fibrillation 07/04/2007   ??? BCC (basal cell carcinoma of skin) 07/04/2007   ??? Resting tremor 07/04/2007     Active Problems:    * No active hospital problems. *      Plan:   Spinal stenosis at L4-5 with suspected disk herniation at L3-4 with extrusion on L4 nerve root, moderate radiculopathy  Schedule Decadron IV 10 mg Q8 x 2  Consult PT/OT  MRI lumbar spine in am  Continue to monitor for improvement in symptoms.  Will d/w Dr. YoAllean Found    AlDennison NancyNP

## 2013-06-12 NOTE — ED Notes (Signed)
Family Practice in ER to evaluate patient.

## 2013-06-12 NOTE — ED Notes (Signed)
FP Resident at bedside.  Patient given pillow.

## 2013-06-12 NOTE — Progress Notes (Signed)
TRANSFER - IN REPORT:    Verbal report received from Robin RN(name) on Cory Dalton  being received from ED(unit) for routine progression of care      Report consisted of patient???s Situation, Background, Assessment and   Recommendations(SBAR).     Information from the following report(s) SBAR, Kardex, ED Summary, MAR, Recent Results and Cardiac Rhythm AFIB was reviewed with the receiving nurse.    Opportunity for questions and clarification was provided.      Assessment completed upon patient???s arrival to unit and care assumed.

## 2013-06-12 NOTE — ED Notes (Signed)
Patient resting.  Warm blanket provided.

## 2013-06-12 NOTE — H&P (Signed)
[facility]    History & Physical    Date of admission: 06/12/2013    Patient name: Cory Dalton  MRN: [id number]  Date of birth: [birth year]  Age: 80 y.o.     Primary care provider:  Jennye Boroughs, MD     Source of Information: patient, medical records and patient's daughter                                    Subjective:       Chief complaint: back pain    History of present illness:   Cory Dalton is a 80 y.o. male with a history of HTN, paroxysmal Afib, BPH and HLD who is admitted with intractable back pain. Pt has a history of lumbar back pain for several years, recently saw his PCP in clinic who recommended physical therapy. He started PT last week and while doing stretches on Wednesday had an acute worsening of pain. Pain is located in lateral L hip, constantly present and dull with occasional twinges of sharp pain that shoot all the way down the leg. Improved with old rx for hydrocodone but pt stopped taking this 2/2 constipation. Worsened with any type of stretching the L hip or lower back and with prolonged standing. Has had numbness/tingling in the L leg for years, which he thinks is 2/2 an old injury. This is unchanged. He is able to walk but has pain and is concerned he could fall. Today he was planning to go visit his daughter who lives 2 hours away but when he talked to her on the phone she was concerned and had a neighbor go see him who called EMS. The pt lives alone since the passing of his wife several months ago.   In the ED, he got 125mg  solumedrol, 4mg  morphine and zofran x1. Pain was greatly improved. CT lumbar spine showed noa cute fx and suspected left L3-L4 disc herniation impinging on L4.     No Known Allergies    Prior to Admission Medications   Outpatient Medications Last Dose Informant Patient Reported? Taking?   DIGOX 125 mcg tablet 06/11/2013 at Unknown time  No Yes   Sig: TAKE 1 TABLET BY MOUTH DAILY   amLODIPine (NORVASC) 5 mg tablet 06/11/2013 at Unknown  time  No Yes   Sig: TAKE 1 TABLET BY MOUTH DAILY   clobetasol (TEMOVATE) 0.05 % external solution 06/11/2013 at Unknown time  No Yes   Sig: Apply to skin irritation once daily   dutasteride (AVODART) 0.5 mg capsule 06/11/2013 at Unknown time  No Yes   Sig: Take 1 Cap by mouth daily.   lovastatin (MEVACOR) 20 mg tablet 06/11/2013 at Unknown time  No Yes   Sig: Take 1 Tab by mouth nightly.   metoprolol-XL (TOPROL XL) 50 mg XL tablet 06/11/2013 at Unknown time  Yes Yes   Sig: Take 25 mg by mouth two (2) times a day.   omega-3 fatty acids-vitamin e (FISH OIL) 1,000 mg cap 06/11/2013 at Unknown time  Yes Yes   Sig: Take 1 Cap by mouth.   tamsulosin (FLOMAX) 0.4 mg capsule 06/11/2013 at Unknown time  No Yes   Sig: Take 1 Cap by mouth daily.   triamterene-hydrochlorothiazide (MAXZIDE) 37.5-25 mg per tablet 06/11/2013 at Unknown time  No Yes   Sig: TAKE 1 TABLET BY MOUTH EVERY DAY   warfarin (COUMADIN) 2.5  mg tablet 06/11/2013 at Unknown time  No Yes   Sig: TAKE 1 TABLET BY MOUTH DAILY EXCEPT ON WEDNESDAYS TAKE 1/2 TABLET      Facility-Administered Medications: None       Past Medical History   Diagnosis Date   ??? Paroxysmal atrial fibrillation 07/04/2007   ??? BPH 07/04/2007   ??? Resting tremor 07/04/2007   ??? BCC (basal cell carcinoma of skin) 07/04/2007   ??? Atrial fibrillation    ??? Skin cancer    ??? Hypertension         Past Surgical History   Procedure Laterality Date   ??? Hx hernia repair  04/2010   ??? Hx cataract removal         History   Substance Use Topics   ??? Smoking status: Never Smoker    ??? Smokeless tobacco: Never Used   ??? Alcohol Use: 1.5 oz/week     3 Glasses of wine per week      Comment: 3 er week        Family History   Problem Relation Age of Onset   ??? Hypertension Mother    ??? Cancer Father      ?prostate cancer, bone cancer           Social history  Patient resides  X  Independently      With family care      Assisted living      SNF    Ambulates  X  Independently      With cane       Assisted walker         Alcohol history      None   X  Social     Chronic   Smoking history    None     Former smoker   X  Current smoker - 2 cigars/month, recently started       Preventive history:  Immunization History   Administered Date(s) Administered   ??? Influenza High Dose Vaccine PF 04/21/2013   ??? Influenza Vaccine 02/21/2012   ??? Pneumococcal Polysaccharide (PPV-23) 03/01/2012   ??? Pneumococcal Vaccine 02/06/2005   ??? RSV Monoclonal Antibody (Palivizumab) IM 02/21/2012       Code status discussed with the patient/caregivers      Full code   X  DNR/DNI              Review of systems  Positive only when bolded  Constitutional: HA, F/C, changes in weight  Eyes: Itching/draining, changes in vision  Ears, nose, mouth, throat, and face: Rhinorrea, congestion, sore throat, ear pain  Respiratory: SOB, wheezing, cough  Cardiovascular: CP, palpitations  Gastrointestinal: Abd pain, diarrhea, constipation, N/V, blood in stool  Genitourinary: Dysuria, hematuria  Integument/breast: Changes in skin, moles or hair, rash  Hematologic/lymphatic: Changes in bleeding or bruising  Musculoskeletal: Myalgias, arthralgias  Neurological: Changes in gait, numbness/tingling      Objective:      Physical Examination   Patient Vitals for the past 12 hrs:   BP Temp Pulse Resp SpO2 Height Weight   06/12/13 1956 140/92 mmHg - 78 18 98 % - -   06/12/13 1732 137/98 mmHg 97.8 ??F (36.6 ??C) 81 16 97 % 5\' 11"  (1.803 m) 190 lb (86.183 kg)     Temp (24hrs), Avg:97.8 ??F (36.6 ??C), Min:97.8 ??F (36.6 ??C), Max:97.8 ??F (36.6 ??C)    No intake or output data in the 24 hours ending 06/12/13 2222  O2 Device: Room air    Gen: Pt lying in bed, in NAD  Head: Normocephalic, atraumatic  Eyes: Sclera anicteric, EOM intact, R pupil pinpoint. L pupil with 8-shaped defect from cataract surgery, does not constrict to light.   Nose: Normal nasal mucosa  Throat: MMM, normal lips, tongue, teeth and gums  Neck: Supple, no LAD, no thyromegaly or carotid bruits  CVS: Normal S1, S2, no m/r/g. Irregular rhythm   Resp: CTAB, no wheezes or rales. Good air movement throughout  Abd: Soft, mild TTP over umbilicus, mild distention, +normoactive BS  Extrem: Atraumatic, no cyanosis or edema  Pulses: 2+ throughout  Skin: Warm, dry  Neuro: Alert, oriented, appropriate. CN III-XII intact. Sensation intact to light touch and temperature throughout. Strength 5/5 in BUE, hips, knees and feet. Gait intact, stable. Unable to elicit patellar DTRs with reflex hammer (pt with h/o surgery to both knees) but +dorsiflexion with manual compression of patellar tendons. Resting tremor of both hands, R>L      Data Review    LABS:    Recent Labs      06/12/13   1942   WBC  9.1   HGB  13.6   HCT  39.4   PLT  187     Recent Labs      06/12/13   1942   NA  137   K  3.9   CL  101   CO2  25   GLU  85   BUN  20   CREA  0.68   CA  9.1   ALB  3.3*   SGOT  24   ALT  22     Recent Labs      06/12/13   1942   INR  2.5*       Chest x-ray reviewed: Image and report reviewed. Lungs clear, no acute process.       Assessment   Mr. Ghee is a 80 y.o. male with a history of HTN, paroxysmal Afib, BPH and HLD who is admitted with intractable back pain.     Patient Active Problem List    Diagnosis Date Noted   ??? Lipid disorder 06/26/2012   ??? Abnormal CT of the abdomen 05/02/2012   ??? BPH (benign prostatic hyperplasia) 10/12/2011   ??? HTN (hypertension) 10/12/2011   ??? S/P colonoscopy 10/12/2011   ??? H/O knee surgery 10/12/2011   ??? Hernia 10/12/2011   ??? Paroxysmal atrial fibrillation 07/04/2007   ??? BCC (basal cell carcinoma of skin) 07/04/2007   ??? Resting tremor 07/04/2007       Plan   Back pain/lumbar radiculopathy 2/2 disc herniation: Likely acute disc herniation given abrupt onset of symptoms last Wednesday after stretching. Seems to be responding very well to steroids and pain medications.   - Admit to medical floor  - Ortho consult, greatly appreciate recs  - Prednisone 60mg  x6 days  - Percocet q4h with morphine for breakthrough pain  - PT/OT  - NPO after MN in case  Ortho planning any surgical intervention, although appears to be doing well with medications.     Constipation: 2/2 opiate medications  - Miralax, colace    HTN: BPs 130-140's/90's in the ED. Pt did not take home meds today  - Continue home metoprolol, triamterene-HCTZ, and amlodipine. Will give dose metoprolol and triamterene-HCTZ now    Paroxysmal Afib: Well controlled on metoprolol and warfarin. INR therapeutic.   - Continue home metoprolol, digoxin, and warfarin 2.5mg  daily  BPH: Stable on home meds  - Continue Flomax and Avodart    HLD: Stable on home meds. Last lipid panel 12/14: XOL 146  TG 39  HDL 80  LDL 58  - Continue lovastatin    FEN/Diet: Regular diet, low salt. NPO after MN  Activity: Ambulate with assistance  DVT prophylaxis: Home warfarin  Isolation precautions: None  Consultations: Ortho, PT/OT, CM  Anticipated disposition: Home vs Rehab  Code Status: DNR       Signed by: Windle GuardMarissa W Voshon Petro, MD - Resident    June 12, 2013 at 9:53 PM

## 2013-06-12 NOTE — ED Notes (Signed)
Patient more comfortable after pain medication.  Given water.

## 2013-06-12 NOTE — Progress Notes (Signed)
[facility]: MED RECONCILIATION    Information obtained from: patient    Comments/Recommendations:   ?? Oxycodone - patient states he has been taking Oxycodone (prescribed to his wife) to help with back pain. He is unsure of strength of tablets. Last dose was yesterday afternoon.  ?? Unless patient to have surgery or other contraindication, please continue Warfarin in this patient with atrial fibrillation.    Significant PMH/Disease States:   Past Medical History   Diagnosis Date   ??? Paroxysmal atrial fibrillation 07/04/2007   ??? BPH 07/04/2007   ??? Resting tremor 07/04/2007   ??? BCC (basal cell carcinoma of skin) 07/04/2007   ??? Atrial fibrillation    ??? Skin cancer    ??? Hypertension      Patient Active Problem List   Diagnosis Code   ??? Paroxysmal atrial fibrillation 427.31   ??? BCC (basal cell carcinoma of skin) 173.91   ??? Resting tremor 781.0   ??? BPH (benign prostatic hyperplasia) 600.90   ??? HTN (hypertension) 401.9   ??? S/P colonoscopy V45.89   ??? H/O knee surgery V45.89   ??? Hernia 553.9   ??? Abnormal CT of the abdomen 793.6   ??? Lipid disorder 272.9   ??? Anemia 285.9   ??? Hyponatremia 276.1     Chief Complaint for this Admission:   Chief Complaint   Patient presents with   ??? Back Pain     Allergies: Review of patient's allergies indicates no known allergies.    Prior to Admission Medications:   Prior to Admission Medications   Outpatient Medications Last Dose Informant Patient Reported? Taking?   DIGOX 125 mcg tablet 06/11/2013 at Unknown time Self No Yes   Sig: TAKE 1 TABLET BY MOUTH DAILY   acetaminophen (TYLENOL) 500 mg tablet 06/11/2013 at Unknown time Self Yes Yes   Sig: Take 1,000 mg by mouth every six (6) hours as needed for Pain.   amLODIPine (NORVASC) 5 mg tablet 06/11/2013 at Unknown time Self No Yes   Sig: TAKE 1 TABLET BY MOUTH DAILY   clobetasol (TEMOVATE) 0.05 % external solution 06/11/2013 at Unknown time Self No Yes   Sig: Apply to skin irritation once daily   dutasteride (AVODART) 0.5 mg capsule 06/11/2013 at Unknown time  Self No Yes   Sig: Take 1 Cap by mouth daily.   lovastatin (MEVACOR) 20 mg tablet 06/11/2013 at Unknown time Self No Yes   Sig: Take 1 Tab by mouth nightly.   metoprolol (LOPRESSOR) 50 mg tablet 06/11/2013 at Unknown time Self Yes Yes   Sig: Take 25 mg by mouth two (2) times a day.   omega-3 fatty acids-vitamin e (FISH OIL) 1,000 mg cap 06/11/2013 at Unknown time Self Yes Yes   Sig: Take 2 capsules by mouth daily.   tamsulosin (FLOMAX) 0.4 mg capsule 06/11/2013 at Unknown time Self No Yes   Sig: Take 1 Cap by mouth daily.   triamterene-hydrochlorothiazide (MAXZIDE) 37.5-25 mg per tablet 06/11/2013 at Unknown time Self No Yes   Sig: TAKE 1 TABLET BY MOUTH EVERY DAY   warfarin (COUMADIN) 2.5 mg tablet 06/11/2013 at Unknown time Self Yes Yes   Sig: Take 2.5 mg by mouth daily.        Vanice SarahJenny Beshere, PharmD

## 2013-06-12 NOTE — H&P (Signed)
[facility] Attending Addendum  I reviewed with the resident the medical history, hospital course, and the resident's findings on physical examination.  I saw and personally examined the patient. I discussed with the resident the patient's diagnosis and concur with the plan.    Maysin Carstens A. Amor Packard, MD  Family Medicine & Obstetrics  [facility]

## 2013-06-12 NOTE — ED Notes (Cosign Needed)
Left sided lower back pain flared up last Wednesday after PT. 100mcg Fentanyl IV in route,

## 2013-06-12 NOTE — ED Provider Notes (Addendum)
Patient is a 80 y.o. male presenting with back pain. The history is provided by the patient.   Back Pain   This is a recurrent (with acute changes 5 dayys ago while doing some therapy stretches.  Worsening pain since and now unable to do ADL's.  Using  wifes pain meds.  Increasing pain into left leg with numbness.  Almost fell today) problem. The problem has been rapidly worsening. The problem occurs constantly. The pain is associated with twisting. The pain is present in the lumbar spine. The quality of the pain is described as sharp, aching and shooting. The pain radiates to the left knee, left foot and left thigh. The pain is at a severity of 6/10. The symptoms are aggravated by certain positions. Stiffness is present in the morning. Associated symptoms include leg pain, tingling and weakness. Pertinent negatives include no chest pain, no fever, no numbness, no headaches, no abdominal pain, no bowel incontinence, no perianal numbness, no bladder incontinence, no dysuria and no pelvic pain. He has tried analgesics for the symptoms. The treatment provided mild relief. The patient's surgical history non-contributory        Past Medical History   Diagnosis Date   ??? Paroxysmal atrial fibrillation 07/04/2007   ??? BPH 07/04/2007   ??? Resting tremor 07/04/2007   ??? BCC (basal cell carcinoma of skin) 07/04/2007   ??? Atrial fibrillation    ??? Skin cancer    ??? Hypertension         Past Surgical History   Procedure Laterality Date   ??? Hx hernia repair  04/2010   ??? Hx cataract removal           Family History   Problem Relation Age of Onset   ??? Hypertension Mother    ??? Cancer Father      ?prostate cancer, bone cancer        History     Social History   ??? Marital Status: MARRIED     Spouse Name: N/A     Number of Children: N/A   ??? Years of Education: N/A     Occupational History   ??? Not on file.     Social History Main Topics   ??? Smoking status: Never Smoker    ??? Smokeless tobacco: Never Used   ??? Alcohol Use: 1.5 oz/week     3 Glasses  of wine per week      Comment: 3 er week   ??? Drug Use: No   ??? Sexually Active: Not on file     Other Topics Concern   ??? Not on file     Social History Narrative   ??? No narrative on file                  ALLERGIES: Review of patient's allergies indicates no known allergies.      Review of Systems   Constitutional: Positive for appetite change and fatigue. Negative for fever and unexpected weight change.   HENT: Negative for facial swelling, rhinorrhea, trouble swallowing, neck pain, dental problem and ear discharge.    Eyes: Negative for pain and discharge.   Respiratory: Negative for apnea and stridor.    Cardiovascular: Negative for chest pain, palpitations and leg swelling.   Gastrointestinal: Positive for constipation. Negative for vomiting, abdominal pain, diarrhea, blood in stool, abdominal distention and bowel incontinence.   Genitourinary: Negative for bladder incontinence, dysuria, hematuria, flank pain, difficulty urinating and pelvic pain.   Musculoskeletal: Positive for  back pain. Negative for myalgias, joint swelling and arthralgias.   Skin: Negative for color change, rash and wound.   Neurological: Positive for tingling and weakness. Negative for facial asymmetry, speech difficulty, numbness and headaches.   Hematological: Negative for adenopathy.   Psychiatric/Behavioral: Negative for suicidal ideas, hallucinations, behavioral problems, self-injury and agitation.       Filed Vitals:    06/12/13 1732   BP: 137/98   Pulse: 81   Temp: 97.8 ??F (36.6 ??C)   Resp: 16   Height: 5\' 11"  (1.803 m)   Weight: 86.183 kg (190 lb)   SpO2: 97%            Physical Exam   Nursing note and vitals reviewed.  Constitutional: He is oriented to person, place, and time. He appears well-developed and well-nourished. He appears distressed.   Appears uncomfortable and unkept   HENT:   Head: Normocephalic and atraumatic.   Right Ear: External ear normal.   Left Ear: External ear normal.   Mouth/Throat: Oropharynx is clear and  moist. No oropharyngeal exudate.   Eyes: Conjunctivae and EOM are normal. Pupils are equal, round, and reactive to light. Right eye exhibits no discharge. Left eye exhibits no discharge. No scleral icterus.   Neck: Normal range of motion. Neck supple. No tracheal deviation present. No thyromegaly present.   Cardiovascular: Normal rate, regular rhythm, normal heart sounds and intact distal pulses.    No murmur heard.  Pulmonary/Chest: Effort normal and breath sounds normal. No respiratory distress. He has no wheezes. He has no rales.   Abdominal: Soft. Bowel sounds are normal. He exhibits distension. There is no tenderness. There is no rebound and no guarding.   Mild distention.    Musculoskeletal: He exhibits tenderness. He exhibits no edema.   Low back with no pathologic curves. Pelvis stable and symmetric. Positive SLR on left.  EHL symmetric. No atrophy   Lymphadenopathy:     He has no cervical adenopathy.   Neurological: He is alert and oriented to person, place, and time. No cranial nerve deficit. He exhibits normal muscle tone. Coordination normal.   Positive diminished sensation in an L4-5 distribution on left.    DTR's left achilles=0, Right side=0  Patella left 1/4 and right =3/4   Skin: Skin is warm. No rash noted. No erythema.   Psychiatric: He has a normal mood and affect. His behavior is normal. Judgment and thought content normal.        MDM     Amount and/or Complexity of Data Reviewed:   Clinical lab tests:  Ordered and reviewed  Tests in the radiology section of CPT??:  Ordered and reviewed  Tests in the medicine section of the CPT??:  Ordered and reviewed   Review and summarize past medical records:  Yes   Discuss the patient with another provider:  Yes   Independant visualization of image, tracing, or specimen:  Yes  Risk of Significant Complications, Morbidity, and/or Mortality:   Presenting problems:  Moderate  Diagnostic procedures:  Moderate  Management options:  Moderate  General Comments:  Large new herniated disc with left L4 compression.  Patient failing care at home and is now unable to care for ADL's.  Will start on steroids IV and pain meds. Will have PCP eval for admission and continued IV therapy  Progress:  (guarded)      Procedures    ED EKG interpretation:  Rhythm: probable afib; and regular . Rate (approx.): 87; Axis: normal; P wave:  normal; QRS interval: normal ; ST/T wave: normal; in  Lead: ; Other findings: borderline ekg. This EKG was interpreted by Sunnie Nielsenaniel E. Mort SawyersAngeli, DO,ED Provider.

## 2013-06-12 NOTE — ED Notes (Signed)
Dr. Mort SawyersAngeli at bedside to reassess patient.

## 2013-06-13 DIAGNOSIS — M539 Dorsopathy, unspecified: Secondary | ICD-10-CM | POA: Diagnosis not present

## 2013-06-13 DIAGNOSIS — R52 Pain, unspecified: Secondary | ICD-10-CM | POA: Diagnosis not present

## 2013-06-13 DIAGNOSIS — M129 Arthropathy, unspecified: Secondary | ICD-10-CM | POA: Diagnosis not present

## 2013-06-13 DIAGNOSIS — M5137 Other intervertebral disc degeneration, lumbosacral region: Secondary | ICD-10-CM | POA: Diagnosis not present

## 2013-06-13 DIAGNOSIS — M47817 Spondylosis without myelopathy or radiculopathy, lumbosacral region: Secondary | ICD-10-CM | POA: Diagnosis not present

## 2013-06-13 DIAGNOSIS — M5126 Other intervertebral disc displacement, lumbar region: Secondary | ICD-10-CM | POA: Diagnosis not present

## 2013-06-13 DIAGNOSIS — S32009A Unspecified fracture of unspecified lumbar vertebra, initial encounter for closed fracture: Secondary | ICD-10-CM | POA: Diagnosis not present

## 2013-06-13 LAB — PROTHROMBIN TIME + INR
INR: 2.5 — ABNORMAL HIGH (ref 0.9–1.1)
INR: 2.5 — ABNORMAL HIGH (ref 0.9–1.1)
Prothrombin time: 26 s — ABNORMAL HIGH (ref 9.0–11.1)
Prothrombin time: 25.8 s — ABNORMAL HIGH (ref 9.0–11.1)

## 2013-06-13 LAB — URINALYSIS W/ REFLEX CULTURE
Bacteria: NEGATIVE /hpf
Bilirubin: NEGATIVE
Blood: NEGATIVE
Glucose: NEGATIVE mg/dL
Ketone: NEGATIVE mg/dL
Leukocyte Esterase: NEGATIVE
Nitrites: NEGATIVE
Protein: NEGATIVE mg/dL
Specific gravity: 1.022 (ref 1.003–1.030)
Urobilinogen: 1 EU/dL (ref 0.2–1.0)
pH (UA): 7 (ref 5.0–8.0)

## 2013-06-13 LAB — METABOLIC PANEL, COMPREHENSIVE
A-G Ratio: 0.9 — ABNORMAL LOW (ref 1.1–2.2)
ALT (SGPT): 22 U/L (ref 12–78)
AST (SGOT): 24 U/L (ref 15–37)
Albumin: 3.3 g/dL — ABNORMAL LOW (ref 3.5–5.0)
Alk. phosphatase: 74 U/L (ref 45–117)
Anion gap: 11 mmol/L (ref 5–15)
BUN/Creatinine ratio: 29 — ABNORMAL HIGH (ref 12–20)
BUN: 20 MG/DL (ref 6–20)
Bilirubin, total: 0.7 MG/DL (ref 0.2–1.0)
CO2: 25 mmol/L (ref 21–32)
Calcium: 9.1 MG/DL (ref 8.5–10.1)
Chloride: 101 mmol/L (ref 97–108)
Creatinine: 0.68 MG/DL (ref 0.45–1.15)
GFR est AA: >60 mL/min/{1.73_m2} (ref >60)
GFR est non-AA: >60 mL/min/{1.73_m2} (ref >60)
Globulin: 3.6 g/dL (ref 2.0–4.0)
Glucose: 85 mg/dL (ref 65–100)
Potassium: 3.9 mmol/L (ref 3.5–5.1)
Protein, total: 6.9 g/dL (ref 6.4–8.2)
Sodium: 137 mmol/L (ref 136–145)

## 2013-06-13 LAB — CBC WITH AUTOMATED DIFF
ABS. BASOPHILS: 0 10*3/uL (ref 0.0–0.1)
ABS. EOSINOPHILS: 0.1 10*3/uL (ref 0.0–0.4)
ABS. LYMPHOCYTES: 2.2 10*3/uL (ref 0.8–3.5)
ABS. MONOCYTES: 0.7 10*3/uL (ref 0.0–1.0)
ABS. NEUTROPHILS: 6 10*3/uL (ref 1.8–8.0)
BASOPHILS: 0 % (ref 0–1)
EOSINOPHILS: 1 % (ref 0–7)
HCT: 39.4 % (ref 36.6–50.3)
HGB: 13.6 g/dL (ref 12.1–17.0)
LYMPHOCYTES: 25 % (ref 12–49)
MCH: 32.3 PG (ref 26.0–34.0)
MCHC: 34.5 g/dL (ref 30.0–36.5)
MCV: 93.6 FL (ref 80.0–99.0)
MONOCYTES: 8 % (ref 5–13)
NEUTROPHILS: 66 % (ref 32–75)
PLATELET: 187 10*3/uL (ref 150–400)
RBC: 4.21 M/uL (ref 4.10–5.70)
RDW: 13.8 % (ref 11.5–14.5)
WBC: 9.1 10*3/uL (ref 4.1–11.1)

## 2013-06-13 LAB — PTT: aPTT: 35.2 s — ABNORMAL HIGH (ref 22.1–30.0)

## 2013-06-13 LAB — EKG, 12 LEAD, INITIAL
Atrial Rate: 90 {beats}/min
Calculated R Axis: 34 degrees
Calculated T Axis: -20 degrees
Diagnosis: NORMAL
Q-T Interval: 388 ms
QRS Duration: 92 ms
QTC Calculation (Bezet): 466 ms
Ventricular Rate: 87 {beats}/min

## 2013-06-13 LAB — GLUCOSE, POC: Glucose (POC): 158 mg/dL — ABNORMAL HIGH (ref 75–110)

## 2013-06-13 MED ORDER — DIGOXIN 0.125 MG TAB
0.125 mg | Freq: Every day | ORAL | Status: DC
Start: 2013-06-13 — End: 2013-06-14
  Administered 2013-06-13 – 2013-06-14 (×2): via ORAL

## 2013-06-13 MED ORDER — AMLODIPINE 5 MG TAB
5 mg | Freq: Every day | ORAL | Status: DC
Start: 2013-06-13 — End: 2013-06-14
  Administered 2013-06-13 – 2013-06-14 (×2): via ORAL

## 2013-06-13 MED ORDER — DOCUSATE SODIUM 100 MG CAP
100 mg | Freq: Two times a day (BID) | ORAL | Status: DC
Start: 2013-06-13 — End: 2013-06-14
  Administered 2013-06-13 – 2013-06-14 (×3): via ORAL

## 2013-06-13 MED ORDER — ONDANSETRON (PF) 4 MG/2 ML INJECTION
4 mg/2 mL | INTRAMUSCULAR | Status: DC | PRN
Start: 2013-06-13 — End: 2013-06-14

## 2013-06-13 MED ORDER — TAMSULOSIN SR 0.4 MG 24 HR CAP
0.4 mg | Freq: Every day | ORAL | Status: DC
Start: 2013-06-13 — End: 2013-06-14
  Administered 2013-06-13 – 2013-06-14 (×2): via ORAL

## 2013-06-13 MED ORDER — OXYCODONE-ACETAMINOPHEN 5 MG-325 MG TAB
5-325 mg | ORAL | Status: DC | PRN
Start: 2013-06-13 — End: 2013-06-14
  Administered 2013-06-14 (×3): via ORAL

## 2013-06-13 MED ORDER — WARFARIN 2.5 MG TAB
2.5 mg | Freq: Every evening | ORAL | Status: DC
Start: 2013-06-13 — End: 2013-06-14
  Administered 2013-06-13 (×2): via ORAL

## 2013-06-13 MED ORDER — PREDNISONE 20 MG TAB
20 mg | Freq: Every day | ORAL | Status: DC
Start: 2013-06-13 — End: 2013-06-13

## 2013-06-13 MED ORDER — DUTASTERIDE 0.5 MG CAP
0.5 mg | Freq: Every day | ORAL | Status: DC
Start: 2013-06-13 — End: 2013-06-14
  Administered 2013-06-13 – 2013-06-14 (×2): via ORAL

## 2013-06-13 MED ORDER — GLYCERIN (ADULT) RECTAL SUPPOSITORY
RECTAL | Status: AC
Start: 2013-06-13 — End: 2013-06-13
  Administered 2013-06-13: 19:00:00 via RECTAL

## 2013-06-13 MED ORDER — PNEUMOCOCCAL 23-VALPS VACCINE 25 MCG/0.5 ML INJECTION
25 mcg/0.5 mL | INTRAMUSCULAR | Status: AC
Start: 2013-06-13 — End: 2013-06-14
  Administered 2013-06-14: 17:00:00 via INTRAMUSCULAR

## 2013-06-13 MED ORDER — METHYLPREDNISOLONE (PF) 125 MG/2 ML IJ SOLR
125 mg/2 mL | INTRAMUSCULAR | Status: AC
Start: 2013-06-13 — End: 2013-06-12
  Administered 2013-06-13: 01:00:00 via INTRAVENOUS

## 2013-06-13 MED ORDER — MORPHINE 4 MG/ML SYRINGE
4 mg/mL | Freq: Once | INTRAMUSCULAR | Status: AC
Start: 2013-06-13 — End: 2013-06-12
  Administered 2013-06-13: 01:00:00 via INTRAVENOUS

## 2013-06-13 MED ORDER — TRIAMTERENE-HYDROCHLOROTHIAZIDE 37.5 MG-25 MG TAB
Freq: Every day | ORAL | Status: DC
Start: 2013-06-13 — End: 2013-06-14
  Administered 2013-06-13 – 2013-06-14 (×2): via ORAL

## 2013-06-13 MED ORDER — DEXAMETHASONE SODIUM PHOSPHATE 10 MG/ML IJ SOLN
10 mg/mL | Freq: Three times a day (TID) | INTRAMUSCULAR | Status: AC
Start: 2013-06-13 — End: 2013-06-13
  Administered 2013-06-13 (×2): via INTRAVENOUS

## 2013-06-13 MED ORDER — MORPHINE 2 MG/ML INJECTION
2 mg/mL | INTRAMUSCULAR | Status: DC | PRN
Start: 2013-06-13 — End: 2013-06-14
  Administered 2013-06-13: 11:00:00 via INTRAVENOUS

## 2013-06-13 MED ORDER — LOVASTATIN 20 MG TAB
20 mg | Freq: Every evening | ORAL | Status: DC
Start: 2013-06-13 — End: 2013-06-14
  Administered 2013-06-13 – 2013-06-14 (×2): via ORAL

## 2013-06-13 MED ORDER — PHARMACY WARFARIN NOTE
Status: DC | PRN
Start: 2013-06-13 — End: 2013-06-14

## 2013-06-13 MED ORDER — ONDANSETRON (PF) 4 MG/2 ML INJECTION
4 mg/2 mL | INTRAMUSCULAR | Status: AC
Start: 2013-06-13 — End: 2013-06-12
  Administered 2013-06-13: 01:00:00 via INTRAVENOUS

## 2013-06-13 MED ORDER — SODIUM CHLORIDE 0.9 % IJ SYRG
INTRAMUSCULAR | Status: DC | PRN
Start: 2013-06-13 — End: 2013-06-14

## 2013-06-13 MED ORDER — PREDNISONE 20 MG TAB
20 mg | Freq: Every day | ORAL | Status: DC
Start: 2013-06-13 — End: 2013-06-12

## 2013-06-13 MED ORDER — METOPROLOL TARTRATE 25 MG TAB
25 mg | Freq: Two times a day (BID) | ORAL | Status: DC
Start: 2013-06-13 — End: 2013-06-14
  Administered 2013-06-13 – 2013-06-14 (×3): via ORAL

## 2013-06-13 MED ORDER — CLOBETASOL 0.05 % OINTMENT
0.05 % | Freq: Every day | CUTANEOUS | Status: DC
Start: 2013-06-13 — End: 2013-06-14
  Administered 2013-06-13 – 2013-06-14 (×2): via TOPICAL

## 2013-06-13 MED ORDER — POLYETHYLENE GLYCOL 3350 17 GRAM (100 %) ORAL POWDER PACKET
17 gram | Freq: Every day | ORAL | Status: DC
Start: 2013-06-13 — End: 2013-06-14
  Administered 2013-06-13 – 2013-06-14 (×2): via ORAL

## 2013-06-13 MED ORDER — .PHARMACY TO SUBSTITUTE PER PROTOCOL
Status: DC
Start: 2013-06-13 — End: 2013-06-12

## 2013-06-13 MED ORDER — METOPROLOL SUCCINATE SR 25 MG 24 HR TAB
25 mg | Freq: Two times a day (BID) | ORAL | Status: DC
Start: 2013-06-13 — End: 2013-06-12

## 2013-06-13 MED ORDER — SODIUM CHLORIDE 0.9 % IJ SYRG
Freq: Three times a day (TID) | INTRAMUSCULAR | Status: DC
Start: 2013-06-13 — End: 2013-06-14
  Administered 2013-06-13 – 2013-06-14 (×5): via INTRAVENOUS

## 2013-06-13 MED FILL — MORPHINE 2 MG/ML INJECTION: 2 mg/mL | INTRAMUSCULAR | Qty: 1

## 2013-06-13 MED FILL — CLOBETASOL 0.05 % OINTMENT: 0.05 % | CUTANEOUS | Qty: 15

## 2013-06-13 MED FILL — MORPHINE 4 MG/ML SYRINGE: 4 mg/mL | INTRAMUSCULAR | Qty: 1

## 2013-06-13 MED FILL — LOVASTATIN 20 MG TAB: 20 mg | ORAL | Qty: 1

## 2013-06-13 MED FILL — COUMADIN 2.5 MG TABLET: 2.5 mg | ORAL | Qty: 1

## 2013-06-13 MED FILL — AVODART 0.5 MG CAPSULE: 0.5 mg | ORAL | Qty: 1

## 2013-06-13 MED FILL — PHARMACY WARFARIN NOTE: Qty: 1

## 2013-06-13 MED FILL — METOPROLOL TARTRATE 25 MG TAB: 25 mg | ORAL | Qty: 1

## 2013-06-13 MED FILL — TAMSULOSIN SR 0.4 MG 24 HR CAP: 0.4 mg | ORAL | Qty: 1

## 2013-06-13 MED FILL — POLYETHYLENE GLYCOL 3350 17 GRAM (100 %) ORAL POWDER PACKET: 17 gram | ORAL | Qty: 1

## 2013-06-13 MED FILL — TRIAMTERENE-HYDROCHLOROTHIAZIDE 37.5 MG-25 MG TAB: ORAL | Qty: 1

## 2013-06-13 MED FILL — SOLU-MEDROL (PF) 125 MG/2 ML SOLUTION FOR INJECTION: 125 mg/2 mL | INTRAMUSCULAR | Qty: 2

## 2013-06-13 MED FILL — DEXAMETHASONE SODIUM PHOSPHATE 10 MG/ML IJ SOLN: 10 mg/mL | INTRAMUSCULAR | Qty: 1

## 2013-06-13 MED FILL — LANOXIN 125 MCG (0.125 MG) TABLET: 125 mcg (0. mg) | ORAL | Qty: 1

## 2013-06-13 MED FILL — ONDANSETRON (PF) 4 MG/2 ML INJECTION: 4 mg/2 mL | INTRAMUSCULAR | Qty: 2

## 2013-06-13 MED FILL — BD POSIFLUSH NORMAL SALINE 0.9 % INJECTION SYRINGE: INTRAMUSCULAR | Qty: 10

## 2013-06-13 MED FILL — SANI-SUPP (ADULT) RECTAL: RECTAL | Qty: 1

## 2013-06-13 MED FILL — DOK 100 MG CAPSULE: 100 mg | ORAL | Qty: 1

## 2013-06-13 MED FILL — AMLODIPINE 5 MG TAB: 5 mg | ORAL | Qty: 1

## 2013-06-13 NOTE — Progress Notes (Signed)
Care Management Interventions   PCP Visit Scheduled by CM: No, primary is Dr. Zebedee IbaPaul Jackson  Palliative Care Consult: No  Care Management Consult: Yes   Discharge Durable Medical Equipment: None at home  Physical Therapy Consult: Yes  Occupational Therapy Consult: Yes  Speech Therapy Consult: No  Confirm Follow Up Transport: Plan discussed with patient?: Daughter to transport home [place]  Pt Discharge Location: Home  Living Situation:   Patient lives in a 1 story home alone. There are 3 steps in the front of the home to enter. He has no DME, has not had home health services, has not been to a SNF. He gets Meals On Wheels 3 days a week. He has Rx coverage with [employer] and [facility] [address] [place]. Currently will follow patient. RNavarro, RN

## 2013-06-13 NOTE — Progress Notes (Signed)
Patient off the floor for MRI at this time. PT will follow in am for evaluation pending results.

## 2013-06-13 NOTE — Progress Notes (Signed)
[facility]  PROGRESS NOTE     06/13/2013  PCP: Jennye Boroughs, MD     Assessment/Plan:   Mr. Knippenberg is a 80 y.o. male with a history of HTN, paroxysmal Afib, BPH and HLD who is admitted with intractable back pain.     Back pain/lumbar radiculopathy 2/2 disc herniation: Likely acute disc herniation given abrupt onset of symptoms last Wednesday after stretching. Seems to be responding very well to steroids and pain medications.   - Ortho following, greatly appreciate recs   - Decadron 10mg  IV q8h x 2 doses  - MRI lumbar spine this AM  - Percocet q4h with morphine for breakthrough pain   - PT/OT    - POC glucose daily. If significantly elevated while on steroids can add insulin sliding scale    Constipation: 2/2 opiate medications   - Miralax, colace. Enema if needed    HTN: BPs 110-140's/70-80's overnight.  - Continue home metoprolol, triamterene-HCTZ, and amlodipine     Paroxysmal Afib: Well controlled on metoprolol and warfarin. INR therapeutic.   - Continue home metoprolol, digoxin, and warfarin 2.5mg  daily   - Daily INR    BPH: Stable on home meds   - Continue Flomax and Avodart     HLD: Stable on home meds. Last lipid panel 12/14: XOL 146 TG 39 HDL 80 LDL 58   - Continue lovastatin     FEN/Diet: Regular diet, low salt   Activity: Ambulate with assistance   DVT prophylaxis: Home warfarin   Isolation precautions: None   Consultations: Ortho, PT/OT, CM   Anticipated disposition: Home vs Rehab   Code Status: DNR      Subjective:   Pt was seen and examined at bedside. Did not require any percocet or morphine overnight. States the pain is improved and he slept most of the night. Denies SOB, CP. Still feeling constipated with some abdominal distention and tenderness.     Objective:   Physical examination  BP 144/81   Pulse 85   Temp(Src) 97 ??F (36.1 ??C)   Resp 18   Ht 5\' 11"  (1.803 m)   Wt 190 lb (86.183 kg)   BMI 26.51 kg/m2   SpO2 96%   Temp (24hrs), Avg:97.6 ??F (36.4 ??C), Min:97 ??F  (36.1 ??C), Max:98 ??F (36.7 ??C)         O2 Device: Room air    No intake or output data in the 24 hours ending 06/13/13 0617  Last shift:       Last 3 shifts:         Gen: Pt lying in bed, in NAD  Head: Normocephalic, atraumatic  Eyes: Sclera anicteric, EOM grossly intact  ENT: MMM  Neck: Supple  CVS: Normal S1, S2, no m/r/g  Resp: CTAB, no wheezes or rales. Slight decreased BS over RUL  Abd: Soft, non-tender, non-distended, +BS  Back: Symmetric, no TTP over lumbar spine or L hip  Extrem: Atraumatic, no cyanosis or edema  Pulses: 2+ throughout  Skin: Warm, dry  Neuro: Alert, oriented, appropriate. Strength 5/5 and sensation intact to light touch and temperature throughout      Data Review:     Recent Labs      06/12/13   1942   WBC  9.1   HGB  13.6   HCT  39.4   PLT  187     Recent Labs      06/12/13   1942   NA  137  K  3.9   CL  101   CO2  25   GLU  85   BUN  20   CREA  0.68   CA  9.1   ALB  3.3*   SGOT  24   ALT  22   INR  2.5*     Medications reviewed  Current Facility-Administered Medications   Medication Dose Route Frequency   ??? [COMPLETED] morphine injection 4 mg  4 mg IntraVENous ONCE   ??? [COMPLETED] ondansetron (ZOFRAN) injection 4 mg  4 mg IntraVENous NOW   ??? [COMPLETED] methylPREDNISolone (PF) (SOLU-MEDROL) injection 125 mg  125 mg IntraVENous NOW   ??? amLODIPine (NORVASC) tablet 5 mg  5 mg Oral DAILY   ??? digoxin (LANOXIN) tablet 0.125 mg  0.125 mg Oral DAILY   ??? dutasteride (AVODART) capsule 0.5 mg  0.5 mg Oral DAILY   ??? lovastatin (MEVACOR) tablet 20 mg  20 mg Oral QHS   ??? tamsulosin (FLOMAX) capsule 0.4 mg  0.4 mg Oral DAILY   ??? triamterene-hydrochlorothiazide (MAXZIDE) 37.5-25 mg per tablet 1 tablet  1 tablet Oral DAILY   ??? warfarin (COUMADIN) tablet 2.5 mg  2.5 mg Oral QPM   ??? sodium chloride (NS) flush 5-10 mL  5-10 mL IntraVENous Q8H   ??? sodium chloride (NS) flush 5-10 mL  5-10 mL IntraVENous PRN   ??? morphine injection 2 mg  2 mg IntraVENous Q4H PRN   ??? oxyCODONE-acetaminophen (PERCOCET) 5-325 mg  per tablet 1 tablet  1 tablet Oral Q4H PRN   ??? ondansetron (ZOFRAN) injection 4 mg  4 mg IntraVENous Q4H PRN   ??? docusate sodium (COLACE) capsule 100 mg  100 mg Oral BID   ??? polyethylene glycol (MIRALAX) packet 17 g  17 g Oral DAILY   ??? dexamethasone (DECADRON) injection 10 mg  10 mg IntraVENous Q8H   ??? [START ON 06/14/2013] predniSONE (DELTASONE) tablet 60 mg  60 mg Oral DAILY WITH BREAKFAST   ??? Warfarin: Chronic coumadin patient   Other PRN   ??? pneumococcal 23-valent (PNEUMOVAX 23) injection 0.5 mL  0.5 mL IntraMUSCular PRIOR TO DISCHARGE   ??? clobetasol (TEMOVATE) 0.05 % ointment   Topical DAILY   ??? metoprolol (LOPRESSOR) tablet 25 mg  25 mg Oral Q12H   ??? [DISCONTINUED] .PHARMACY TO SUBSTITUTE PER PROTOCOL       ??? [DISCONTINUED] metoprolol succinate (TOPROL-XL) XL tablet 25 mg  25 mg Oral BID   ??? [DISCONTINUED] predniSONE (DELTASONE) tablet 60 mg  60 mg Oral DAILY WITH BREAKFAST         Signed:   Windle GuardMarissa W Verland Sprinkle, MD   Resident, Family Medicine      Attending note:   Attending note to follow

## 2013-06-13 NOTE — Progress Notes (Signed)
Drinda ButtsElizabeth Williams, [place], notified that MRI had resulted and pt inquiring about possible discharge.

## 2013-06-13 NOTE — Progress Notes (Signed)
Family Practice paged to follow up on discharge as pt's and pt's daughter awaiting decision. Spoke with Dr. Sena HitchYiu and Md states pt will be discharged tomorrow. Pt aware.

## 2013-06-13 NOTE — Progress Notes (Signed)
[facility] Attending Addendum  I reviewed with the resident the medical history, hospital course, and the resident's findings on physical examination.  I saw and personally examined the patient.  I discussed with the resident the patient's diagnosis and concur with the plan with the following additions/modifications: Responding appropriately to conservative therapy with IV decadron, pain control. Anticipate d/c to home when seen by ortho.    Dayle PointsJill A. Farouk Vivero, MD  Family Medicine & Obstetrics  [facility]

## 2013-06-13 NOTE — Progress Notes (Signed)
Bedside and Verbal shift change report given to RwandaVernita, Charity fundraiserN (Cabin crewoncoming nurse) by Gerri SporeAllison Brook, RN (offgoing nurse). Report included the following information SBAR, Kardex, Procedure Summary, Intake/Output, MAR and Recent Results.

## 2013-06-13 NOTE — Progress Notes (Signed)
Occupational Therapy order received and acknowledged. Chart reviewed, nursing consulted.Patient is observation status with back and left hip pain, pending MRI. Defer evaluation pending results of MRI. Will continue to follow.

## 2013-06-13 NOTE — Progress Notes (Signed)
CM note:  Nurse nav notified of pt's admission to [facility].  Thanks.  Melanie Dalton, LCSW

## 2013-06-13 NOTE — Progress Notes (Signed)
Progress Note:    MRI results reviewed and discussed with Dr. Elyn PeersYork.   Spoke with patient and family by phone.  Reviewed MRI results, treatment options and follow up.   Discussed may need ESI if recurs.   They would like to go home if ok with medical service.     Plan:  Ok for discharge per Ortho service.   He has responded to steroids so well, no need for further invasive treatment at this time which is also pts preference.   Home on short course oral steroids  Follow up with Dr. Brayton LaymanJohn York in one week. (follow up placed in chart)  Hold on home PT until seen by Dr. Elyn PeersYork.   Judicious use of prn pain medication.     D/W Dr. Elyn PeersYork, advises above plan of care  M. Drinda ButtsElizabeth Danyla Wattley, PA-C  [facility]  Pager: [phone]

## 2013-06-13 NOTE — Progress Notes (Signed)
Bedside and Verbal shift change report given to Allison RN (oncoming nurse) by Vernita RN (offgoing nurse). Report included the following information SBAR, Kardex, MAR and Recent Results.

## 2013-06-13 NOTE — Progress Notes (Signed)
Pt given MRI checklist to complete.

## 2013-06-14 DIAGNOSIS — S32009A Unspecified fracture of unspecified lumbar vertebra, initial encounter for closed fracture: Secondary | ICD-10-CM | POA: Diagnosis not present

## 2013-06-14 DIAGNOSIS — M129 Arthropathy, unspecified: Secondary | ICD-10-CM | POA: Diagnosis not present

## 2013-06-14 DIAGNOSIS — M5126 Other intervertebral disc displacement, lumbar region: Secondary | ICD-10-CM | POA: Diagnosis not present

## 2013-06-14 LAB — PROTHROMBIN TIME + INR
INR: 4 — ABNORMAL HIGH (ref 0.9–1.1)
Prothrombin time: 42.8 s — ABNORMAL HIGH (ref 9.0–11.1)

## 2013-06-14 LAB — GLUCOSE, POC
Glucose (POC): 183 mg/dL — ABNORMAL HIGH (ref 75–110)
Glucose (POC): 121 mg/dL — ABNORMAL HIGH (ref 75–110)

## 2013-06-14 MED ORDER — POLYETHYLENE GLYCOL 3350 100 % ORAL POWDER
17 gram/dose | Freq: Every day | ORAL | Status: DC
Start: 2013-06-14 — End: 2013-06-14

## 2013-06-14 MED ORDER — METOPROLOL TARTRATE 25 MG TAB
25 mg | ORAL_TABLET | Freq: Two times a day (BID) | ORAL | Status: DC
Start: 2013-06-14 — End: 2013-06-14

## 2013-06-14 MED ORDER — WARFARIN 2.5 MG TAB
2.5 mg | ORAL_TABLET | Freq: Every evening | ORAL | Status: AC
Start: 2013-06-14 — End: 2013-07-14

## 2013-06-14 MED ORDER — DOCUSATE SODIUM 100 MG CAP
100 mg | ORAL_CAPSULE | Freq: Two times a day (BID) | ORAL | Status: DC
Start: 2013-06-14 — End: 2013-06-14

## 2013-06-14 MED ORDER — OXYCODONE-ACETAMINOPHEN 5 MG-325 MG TAB
5-325 mg | ORAL_TABLET | ORAL | Status: DC | PRN
Start: 2013-06-14 — End: 2013-10-04

## 2013-06-14 MED ORDER — METOPROLOL TARTRATE 25 MG TAB
25 mg | ORAL_TABLET | ORAL | Status: DC
Start: 2013-06-14 — End: 2013-06-22

## 2013-06-14 MED ORDER — PREDNISONE 20 MG TAB
20 mg | ORAL_TABLET | Freq: Every day | ORAL | Status: AC
Start: 2013-06-14 — End: 2013-06-19

## 2013-06-14 MED ORDER — DOCUSATE SODIUM 100 MG CAP
100 mg | ORAL_CAPSULE | Freq: Two times a day (BID) | ORAL | Status: AC
Start: 2013-06-14 — End: 2013-09-12

## 2013-06-14 MED ORDER — GLYCERIN (ADULT) RECTAL SUPPOSITORY
RECTAL | Status: AC
Start: 2013-06-14 — End: 2013-06-14
  Administered 2013-06-14: 17:00:00 via RECTAL

## 2013-06-14 MED ORDER — PREDNISONE 20 MG TAB
20 mg | ORAL_TABLET | Freq: Every day | ORAL | Status: DC
Start: 2013-06-14 — End: 2013-06-14

## 2013-06-14 MED ORDER — POLYETHYLENE GLYCOL 3350 100 % ORAL POWDER
17 gram/dose | Freq: Every day | ORAL | Status: DC
Start: 2013-06-14 — End: 2014-06-21

## 2013-06-14 MED FILL — TRIAMTERENE-HYDROCHLOROTHIAZIDE 37.5 MG-25 MG TAB: ORAL | Qty: 1

## 2013-06-14 MED FILL — OXYCODONE-ACETAMINOPHEN 5 MG-325 MG TAB: 5-325 mg | ORAL | Qty: 1

## 2013-06-14 MED FILL — POLYETHYLENE GLYCOL 3350 17 GRAM (100 %) ORAL POWDER PACKET: 17 gram | ORAL | Qty: 1

## 2013-06-14 MED FILL — TAMSULOSIN SR 0.4 MG 24 HR CAP: 0.4 mg | ORAL | Qty: 1

## 2013-06-14 MED FILL — LOVASTATIN 20 MG TAB: 20 mg | ORAL | Qty: 1

## 2013-06-14 MED FILL — AVODART 0.5 MG CAPSULE: 0.5 mg | ORAL | Qty: 1

## 2013-06-14 MED FILL — AMLODIPINE 5 MG TAB: 5 mg | ORAL | Qty: 1

## 2013-06-14 MED FILL — METOPROLOL TARTRATE 25 MG TAB: 25 mg | ORAL | Qty: 1

## 2013-06-14 MED FILL — LANOXIN 125 MCG (0.125 MG) TABLET: 125 mcg (0. mg) | ORAL | Qty: 1

## 2013-06-14 MED FILL — PNEUMOVAX-23 25 MCG/0.5 ML INJECTION SOLUTION: 25 mcg/0.5 mL | INTRAMUSCULAR | Qty: 0.5

## 2013-06-14 MED FILL — SANI-SUPP (ADULT) RECTAL: RECTAL | Qty: 1

## 2013-06-14 MED FILL — DOK 100 MG CAPSULE: 100 mg | ORAL | Qty: 1

## 2013-06-14 NOTE — Progress Notes (Signed)
Problem: Mobility Impaired (Adult and Pediatric)  Goal: *Acute Goals and Plan of Care (Insert Text)  Physical Therapy Goals  Initiated 06/14/2013    1. Patient will perform sit to stand with modified independence within 7 day(s).  2. Patient will ambulate with modified independence for 450 feet with the least restrictive device within 7 day(s).   3. Patient will ascend/descend 3 stairs with handrail(s) with modified independence within 7 day(s).  PHYSICAL THERAPY EVALUATION WITH CMS G CODES  Patient: Cory Dalton (80 y.o. male)  Date: 06/14/2013  Primary Diagnosis: intractable pain        Precautions:   Back      ASSESSMENT :  Based on the objective data described below, the patient presents with left SI area and lateral thigh pain at rest that increases with activity, decreased endurance, and limited functional mobility compared with baseline. Pt reports complete independence with mobility at baseline. On evaluation he initially rerquires supervision for ambulation, however with increasing pain he requires minimum assistance without device or CGA with single point cane or distant supervision with rolling walker. Pt educated regarding log roll technique and demonstrates understanding. Recommend rolling walker for home use to optimize safety and independence. Also recommend HHPT consult for home safety evaluation. Discussed with care manager.     Patient will benefit from skilled intervention to address the above impairments.  Patient???s rehabilitation potential is considered to be Good  Factors which may influence rehabilitation potential include:   [X]          None noted  [ ]          Mental ability/status  [ ]          Medical condition  [ ]          Home/family situation and support systems  [ ]          Safety awareness  [ ]          Pain tolerance/management  [ ]          Other:        PLAN :  Recommendations and Planned Interventions:  [ ]            Bed Mobility Training             [ ]     Neuromuscular Re-Education   [X]            Transfer Training                   [ ]     Orthotic/Prosthetic Training  [X]            Gait Training                         [ ]     Modalities  [X]            Therapeutic Exercises           [ ]     Edema Management/Control  [X]            Therapeutic Activities            [X]     Patient and Family Training/Education  [ ]            Other (comment):    Frequency/Duration: Patient will be followed by physical therapy 5 times a week to address goals.  Discharge Recommendations: Home Health  Further Equipment Recommendations for Discharge: rolling walker; pt to purchase adjustable single point cane and educated regarding appropriate adjustment.  SUBJECTIVE:   Patient stated ???I think having something to help me walk would be a good thing.???      OBJECTIVE DATA SUMMARY:       Past Medical History   Diagnosis Date   ??? Paroxysmal atrial fibrillation 07/04/2007   ??? BPH 07/04/2007   ??? Resting tremor 07/04/2007   ??? BCC (basal cell carcinoma of skin) 07/04/2007   ??? Atrial fibrillation     ??? Skin cancer     ??? Hypertension       Past Surgical History   Procedure Laterality Date   ??? Hx hernia repair   04/2010   ??? Hx cataract removal         Prior Level of Function/Home Situation: pt reports complete independence at baseline.   Home Situation  Home Environment: Private residence  # Steps to Enter: 3  Rails to Enter: Yes  Hand Rails : Right  One/Two Story Residence: One story  Living Alone: Yes   Support Systems: Child(ren)  Patient Expects to be Discharged to:: Private residence  Current DME Used/Available at Home: Grab bars;Blood pressure cuff  Tub or Shower Type: Shower  Critical Behavior:  Neurologic State: Alert;Appropriate for age  Orientation Level: Appropriate for age;Oriented X4  Cognition: Appropriate decision making;Appropriate for age attention/concentration;Appropriate safety awareness;Follows commands  Safety/Judgement: Awareness of environment;Insight into deficits;Good awareness of safety precautions     Strength:    Strength: Within functional limits                    Tone & Sensation:   Tone: Normal              Sensation: Impaired; decreased light touch sensation at left lower leg and foot, intermittent, consistent with baseline for several years per pt.               Range Of Motion:  AROM: Within functional limits                       Coordination:  Coordination: Within functional limits    Functional Mobility:  Bed Mobility:  Rolling: Modified independence, requires equipment;Verbal cues (initial education for log roll)  Supine to Sit: Modified independence, requires equipment;Verbal cues  Sit to Supine: Modified independence, requires equipment;Verbal cues  Scooting: Modified independence, requires equipment  Transfers:  Sit to Stand: Modified independence, requires equipment  Stand to Sit: Modified independence, requires equipment        Bed to Chair: Independent;Additional time              Balance:   Sitting: Intact  Standing: Without support  Standing - Static: Good  Standing - Dynamic :  (good, declining to fair with increased pain)  Ambulation/Gait Training:  Distance (ft): 150 Feet (ft)  Assistive Device: Gait belt;Walker, rolling;Cane, straight           Gait Abnormalities: Antalgic           Stance: Left decreased  Speed/Cadence: Fluctuations  Step Length: Right shortened                             Initially pt with straight path, no assistive device. Increasing antalgic gait and path deviations coorelating with increased pain. Transitioned to single point cane use with initially supervision, then pt reaching for contralateral upper extremity support on railing and requiring CGA (reporting again increased pain). Pt ambulates with modified independence using  rolling walker throughout remainder of trial. Pt educated regarding appropriate transfer techniques and gait techniques using both devices and demonstrates understanding.      Functional Measure:  Tinetti test:      Sitting Balance: 1  Arises:  1  Attempts to Rise: 2  Immediate Standing Balance: 2  Standing Balance: 2  Nudged: 2  Eyes Closed: 1  Turn 360 Degrees - Continuous/Discontinuous: 1  Turn 360 Degrees - Steady/Unsteady: 1  Sitting Down: 1  Balance Score: 14  Indication of Gait: 1  R Step Length/Height: 1  L Step Length/Height: 1  R Foot Clearance: 1  L Foot Clearance: 1  Step Symmetry: 0  Step Continuity: 1  Path: 1  Trunk: 1  Walking Time: 1  Gait Score: 9  Total Score: 23        Tinetti Test and G-code impairment scale:  Percentage of Impairment CH    0%    CI    1-19% CJ    20-39% CK    40-59% CL    60-79% CM    80-99% CN     100%   Tinetti  Score 0-28 28 23-27 17-22 12-16 6-11 1-5 0         Tinetti Tool Score Risk of Falls  <19 = High Fall Risk  19-24 = Moderate Fall Risk  25-28 = Low Fall Risk  Tinetti ME. Performance-Oriented Assessment of Mobility Problems in Elderly Patients. JAGS 1986; [id number]. (Scoring Description: PT Bulletin Feb. 10, 1993)    Older adults: Lonn [place](Ko et al, 2009; n = 1000 [place] elderly evaluated with ABC, POMA, ADL, and IADL)  ?? Mean POMA score for males aged 65-79 years = 26.21(3.40)  ?? Mean POMA score for females age 78-79 years = 25.16(4.30)  ?? Mean POMA score for males over 80 years = 23.29(6.02)  ?? Mean POMA score for females over 80 years = 17.20(8.32)            G codes:  In compliance with CMS???s Claims Based Outcome Reporting, the following G-code set was chosen for this patient based on their primary functional limitation being treated:    The outcome measure chosen to determine the severity of the functional limitation was the Tinetti with a score of 23/28 which was correlated with the impairment scale.      ?? Mobility - Walking and Moving Around:              [id number] - CURRENT STATUS:        CI - 1%-19% impaired, limited or restricted              G8979 - GOAL STATUS:                CH - 0% impaired, limited or restricted              [id number] - D/C STATUS:                       ---------------To be  determined---------------     Pain:  Pain Scale 1: Numeric (0 - 10)  Pain Intensity 1: 4 with activity; decreased with rest.  Pain Location 1: Leg;Back  Pain Orientation 1: Left;Lower  Pain Description 1: Radiating  Pain Intervention(s) 1: Medication (see MAR)  Activity Tolerance:   Limited by pain.  Please refer to the flowsheet for vital signs taken during this treatment.  After treatment:   [ ]   Patient left in no apparent distress sitting up in chair  [X]          Patient left in no apparent distress in bed  [X]          Call bell left within reach  [X]          Nursing notified  [X]          Caregiver present  [ ]          Bed alarm activated      COMMUNICATION/EDUCATION:   The patient???s plan of care was discussed with: Occupational Therapist, Registered Nurse and Care manager.  [X]          Fall prevention education was provided and the patient/caregiver indicated understanding.  [X]          Patient/family have participated as able in goal setting and plan of care.  [X]          Patient/family agree to work toward stated goals and plan of care.  [ ]          Patient understands intent and goals of therapy, but is neutral about his/her participation.  [ ]          Patient is unable to participate in goal setting and plan of care.    Thank you for this referral.  Juliann Pares, PT, DPT   Time Calculation: 20 mins

## 2013-06-14 NOTE — Progress Notes (Signed)
Case Management Note     Choice letter offered to patient for Rolling walker and HH safety eval. He chose [facility]. RNavarro< RCharity fundraiser

## 2013-06-14 NOTE — Progress Notes (Signed)
Bedside and Verbal shift change report given to Allison RN (oncoming nurse) by Vernita RN (offgoing nurse). Report included the following information SBAR, Kardex, Intake/Output, MAR and Recent Results.

## 2013-06-14 NOTE — Progress Notes (Signed)
Case Management Note    RW given to patient from [facility]. [facility]. RNavarro< RCharity fundraiser

## 2013-06-14 NOTE — Progress Notes (Signed)
Discharge instructions reviewed with pt and his daughter.  One prescription given, others called in by MD. Reviewed medications and administration instructions.  Reviewed follow-up appointments and home health.  Opportunity for questions provided. Pt agreeable to discharge home. Pt in no acute distress. Newly delivered walker taken home upon discharge.

## 2013-06-14 NOTE — Progress Notes (Signed)
Occupational Therapy EVALUATION/discharge with cms g codes  Patient: Cory Dalton (80 y.o. male)  Date: 06/14/2013  Primary Diagnosis: intractable pain        Precautions:   Back    ASSESSMENT AND RECOMMENDATIONS:  Based on the objective data described below, the patient presents with baseline presentation of independence with completion of ADL assessment.Patiet defers sitting in chair due to causing increased pain to left hip and thigh. Patient admitted on observation status for back pain, resolving with oral steroids and pain medication.Patient is alert and oriented, describes living alone with assist from neighbors and family for home management and meals. Patient demonstrates good safety awareness, has no DME or AE needs. Educated in log roll and back precautions, compensatory techniques and safety strategies for completion of ADLS with back pain.Patient interested in various forms of exercise including swimming and OP PT. Follow up with Dr. Elyn Peers before returning to any exercise noted.Patient has no further OT needs at this time..  Skilled occupational therapy is completed at this time.  Discharge Recommendations: None  Further Equipment Recommendations for Discharge: none      SUBJECTIVE:   Patient stated ???I feel so much better already.???    OBJECTIVE DATA SUMMARY:     Past Medical History   Diagnosis Date   ??? Paroxysmal atrial fibrillation 07/04/2007   ??? BPH 07/04/2007   ??? Resting tremor 07/04/2007   ??? BCC (basal cell carcinoma of skin) 07/04/2007   ??? Atrial fibrillation    ??? Skin cancer    ??? Hypertension      Past Surgical History   Procedure Laterality Date   ??? Hx hernia repair  04/2010   ??? Hx cataract removal       Prior Level of Function/Home Situation: I with ADLS and IADLS, lives alone and describes inactive lifestyle with assist for meals and home management   Home Situation  Home Environment: Private residence  # Steps to Enter: 3  Rails to Enter: Yes  Hand Rails : Right  One/Two Story Residence: One story   Living Alone: Yes   Support Systems: Child(ren)  Patient Expects to be Discharged to:: Private residence  Current DME Used/Available at Home: Grab bars;Blood pressure cuff  Tub or Shower Type: Shower  [x]      Right hand dominant   []      Left hand dominant  Cognitive/Behavioral Status:  Neurologic State: Alert;Appropriate for age  Orientation Level: Appropriate for age;Oriented X4  Cognition: Appropriate decision making;Appropriate for age attention/concentration;Appropriate safety awareness;Follows commands  Perception: Appears intact  Perseveration: No perseveration noted  Safety/Judgement: Awareness of environment;Insight into deficits;Good awareness of safety precautions  Skin: appears intact UE  Edema: none noted UE  Vision/Perceptual:                      Diplopia: No    Acuity: Within Defined Limits;Able to read clock/calendar on wall without difficulty    Corrective Lenses: Glasses  Coordination:  Coordination: Within functional limits (BUE)  Fine Motor Skills-Upper: Left Intact;Right Intact    Gross Motor Skills-Upper: Left Intact;Right Intact  Balance:  Sitting: Intact;Without support  Standing: Intact;Without support  Strength:    Strength: Within functional limits (BUE)              Tone & Sensation:    Tone: Normal (BUE)  Sensation: Intact (BUE)                    Range of Motion:  AROM: Within functional limits (BUE)                       Functional Mobility and Transfers for ADLs:  Bed Mobility:  Rolling: Modified independence, requires equipment (educate in log roll technique to protect back)  Supine to Sit: Modified independence, requires equipment (use of bed rail)  Sit to Supine: Independent;Verbal cues  Scooting: Independent  Transfers:  Sit to Stand: Independent;Additional time     Bed to Chair: Independent;Additional time          Toilet Transfer : Modified independent (use of grab bar)              ADL Assessment:  Feeding: Supervision/set up    Oral Facial Hygiene/Grooming: Independent (in  standing at sink)    Bathing: Independent    Upper Body Dressing: Independent    Lower Body Dressing: Independent    Toileting: Independent              ADL Intervention:    Patient instructed to don all clothing while sitting prior to standing, doff all clothing to knees while standing, then sit to doff clothing off from knees to feet in order to facilitate fall prevention, pain management, and energy conservation with ADLS. Patient indicated understanding/recalled strategies with min instruction.   Patient instructed and indicated understanding the benefits of maintaining activity tolerance, functional mobility, and independence with self care tasks during acute stay to ensure safe return home and to baseline. Encouraged patient to increase frequency and duration OOB, be out of bed for all meals, perform daily ADLs (as approved by RN/MD regarding bathing etc), and performing functional mobility to/from bathroom.                                         Cognitive Retraining  Safety/Judgement: Awareness of environment;Insight into deficits;Good awareness of safety precautions  Functional Measure:  Barthel    G codes:  In compliance with CMS???s Claims Based Outcome Reporting, the following G-code set was chosen for this patient based on their primary functional limitation being treated:    The outcome measure chosen to determine the severity of the functional limitation was the Barthel Index with a score of 20/20 which was correlated with the impairment scale.      Other PT/OT Primary Functional Limitations:    [id number] - CURRENT STATUS: CH - 0% impaired, limited or restricted   G8991 - GOAL STATUS:  CH - 0% impaired, limited or restricted   [id number] - D/C STATUS:  CH - 0% impaired, limited or restricted       Pain:  Pain Scale 1: Numeric (0 - 10)  Pain Intensity 1: 2  Pain Location 1: Leg;Back  Pain Orientation 1: Left;Lower  Pain Description 1: Radiating  Pain Intervention(s) 1: Medication (see MAR)  Activity Tolerance:    Patient completed multiple sit to stand transfers, mobility around room and bathroom   in completion of ADLS with no LOB or break  Please refer to the flowsheet for vital signs taken during this treatment.  After treatment:   []   Patient left in no apparent distress sitting up in chair  [x]   Patient left in no apparent distress in bed  [x]   Call bell left within reach  [x]   Nursing notified  [x]   Caregiver present  []   Bed alarm activated  COMMUNICATION/EDUCATION:   Communication/Collaboration:  [x]       Home safety education was provided and the patient/caregiver indicated understanding.  [x]       Patient/family have participated as able and agree with findings and recommendations.  []       Patient is unable to participate in plan of care at this time.  Findings and recommendations were discussed with: Physical Therapist, Registered Nurse and Social Worker    Toniann Fail A. Clarisa Schools, OT  Time Calculation: 20 mins

## 2013-06-14 NOTE — Discharge Summary (Signed)
[facility] Attending Addendum  I reviewed with the resident the medical history, hospital course, and the resident's findings on physical examination.  I saw and personally examined the patient. I discussed with the resident the patient's diagnosis and concur with the plan.    Tresa Jolley A. Cong Hightower, MD  Family Medicine & Obstetrics  [facility]

## 2013-06-14 NOTE — Discharge Summary (Signed)
[address]  [place], [place] [address]   Office [phone], Fax [phone]        Discharge Summary     Patient: CLAUDIA ALVIZO       MRN: [id number]       Date of birth: February 01, 1927       Age: 80 y.o.     Date of admission:  06/12/2013    Date of discharge:  06/14/2013    Primary care provider:  Jennye Boroughs, MD     Admitting provider:  [name], MD    Discharging provider(s): [name], DO - Family Medicine Resident  Dr. Frann Rider, MD - Family Medicine Attending     Consultations  IP CONSULT TO ORTHOPEDIC SURGERY    Procedures  None     Discharge destination: Home.  The patient is stable for discharge.    Admission diagnosis  intractable pain    Admission HPI per admitting provider:  Mr. Nobbe is a 80 y.o. male with a history of HTN, paroxysmal Afib, BPH and HLD who is admitted with intractable back pain. Pt has a history of lumbar back pain for several years, recently saw his PCP in clinic who recommended physical therapy. He started PT last week and while doing stretches on Wednesday had an acute worsening of pain. Pain is located in lateral L hip, constantly present and dull with occasional twinges of sharp pain that shoot all the way down the leg. Improved with old rx for hydrocodone but pt stopped taking this 2/2 constipation. Worsened with any type of stretching the L hip or lower back and with prolonged standing. Has had numbness/tingling in the L leg for years, which he thinks is 2/2 an old injury. This is unchanged. He is able to walk but has pain and is concerned he could fall. Today he was planning to go visit his daughter who lives 2 hours away but when he talked to her on the phone she was concerned and had a neighbor go see him who called EMS. The pt lives alone since the passing of his wife several months ago.   In the ED, he got 125mg  solumedrol, 4mg  morphine and zofran x1. Pain was greatly improved. CT lumbar spine showed noa cute fx and suspected left L3-L4  disc herniation impinging on L4.        Final discharge diagnoses and brief hospital course    1) Lumbar Disc Herniation  Radicular pain, pt presented as above.  Improved with high dose steroids and narcotics. Symptoms were confirmed with MRI which revealed. Disc bulge at L4-5 with left paracentral approximately 2 cm superior disc extrusion. Also with various areas of stenosis.  Prednisone 60 mg x 5 days  Percocet 5/325, every four hours, as needed for pain, #15  Follow up with Ortho in 1-2 weeks.    2) Supra-therapeutic INR  Incidental finding with INR of 4.0, no new antibiotics were started.  Advised to hold dose and re-check INR tomorrow with [name], MD.    3)Constipation  Treated with 2 glycerine suppositories and started on an outpatient bowel regimen. Advised ambulation as tolerated.   Docusate, take 100 mg tab two times per day.  Miralax, take 17g, as needed when constipated.      Labs/Imaging Needed on follow up:   INR   BP check    Follow-up Care:  Follow-up Information    Follow up With Details Comments Contact Info    Jonny Ruiz  H York, DO Schedule an appointment as soon as possible for a visit Call to make an appointment in the next 1-2 weeks.   [address] [facility]  [facility]  [address]  [place] [address]  [phone]      Wende Crease, MD On 06/15/2013 10:00 AM, INR check and hospital follow up [address] [facility] [place] [address]  [phone]      Jennye Boroughs, MD       [facility]    [phone]             Physical examination at discharge  Visit Vitals   Item Reading   ??? BP 121/75   ??? Pulse 69   ??? Temp 97.8 ??F (36.6 ??C)   ??? Resp 18   ??? Ht 5\' 11"  (1.803 m)   ??? Wt 190 lb (86.183 kg)   ??? BMI 26.51 kg/m2   ??? SpO2 96%       General:  Alert, cooperative, no distress   Head:  Normocephalic, without obvious abnormality, atraumatic   Eyes:  Conjunctivae/corneas clear. PERRL, EOMs intact   E/N/M/T: Nares normal. Septum midline. No nasal drainage  or sinus tenderness  Lips, mucosa, and tongue normal   Clear oropharynx   Neck: Normal appearance and movements, symmetrical, trachea midline  No palpable adenopathy  No thyroid enlargement, tenderness or nodules  No carotid bruit   Normal JVP   Lungs:   Symmetrical chest expansion and respiratory effort  Clear to auscultation bilaterally   Chest wall:  No tenderness or deformity   Heart:  Regular rhythm   Sounds normal; no murmur, click, rub or gallop   Abdomen:   Soft, no tenderness  Bowel sounds normal  No masses or hepatosplenomegaly  No hernias present   Back: No CVA tenderness   Extremities: Extremities normal, atraumatic  No cyanosis or edema  No DVT signs   Pulses 2+ and symmetric all extremities   Skin: No rashes or ulcers   Musculo-      skeletal: Gait not tested  Normal symmetry, ROM, strength and tone   Neuro: Normal cranial nerves  Normal reflexes and sensation   Psych: Alert, oriented x3  Normal affect, judgement and insight        Recent Labs      06/12/13   1942   WBC  9.1   HGB  13.6   HCT  39.4   PLT  187     Recent Labs      06/12/13   1942   NA  137   K  3.9   CL  101   CO2  25   BUN  20   CREA  0.68   GLU  85   CA  9.1     Recent Labs      06/12/13   1942   SGOT  24   AP  74   TP  6.9   ALB  3.3*   GLOB  3.6     Recent Labs      06/14/13   0426  06/13/13   0240  06/12/13   1942   INR  4.0*  2.5*  2.5*   PTP  42.8*  25.8*  26.0*   APTT   --    --   35.2*      No results found for this basename: FE, TIBC,  PSAT, FERR,  in the last 72 hours   No results found for this basename: PH, PCO2, PO2,  in the last 72 hours  No results found for this basename: CPK, CKMB, TROPONINI,  in the last 72 hours  No components found with this basename: [facility]       Discharge Medication List as of 06/14/2013 12:52 PM      START taking these medications    Details   oxyCODONE-acetaminophen (PERCOCET) 5-325 mg per tablet Take 1 tablet by mouth every four (4) hours as needed for Pain., Print, Disp-15 tablet, R-0          CONTINUE these medications which have CHANGED    Details   docusate sodium (COLACE) 100 mg capsule Take 1 capsule by mouth two (2) times a day for 90 days., Normal, Disp-60 capsule, R-2      polyethylene glycol (MIRALAX) 17 gram/dose powder Take 17 g by mouth daily., NormalAs needed for constipation.Disp-17 g, R-0      predniSONE (DELTASONE) 20 mg tablet Take 3 tablets by mouth daily for 5 days., Normal, Disp-15 tablet, R-0      !! warfarin (COUMADIN) 2.5 mg tablet Take 1 tablet by mouth every evening for 30 days., NormalSTOP medication until advise to re-start by PCP.Disp-30 tablet, R-0      metoprolol (LOPRESSOR) 25 mg tablet TAKE 1 TABLET BY MOUTH EVERY 12 HOURS, Normal**Patient requests 90 days supply**Disp-180 tablet, R-0       !! - Potential duplicate medications found. Please discuss with provider.      CONTINUE these medications which have NOT CHANGED    Details   !! warfarin (COUMADIN) 2.5 mg tablet Take 2.5 mg by mouth daily., Historical Med      acetaminophen (TYLENOL) 500 mg tablet Take 1,000 mg by mouth every six (6) hours as needed for Pain., Historical Med      triamterene-hydrochlorothiazide (MAXZIDE) 37.5-25 mg per tablet TAKE 1 TABLET BY MOUTH EVERY DAY, Normal**Patient requests 90 days supply**Disp-90 tablet, R-3      clobetasol (TEMOVATE) 0.05 % external solution Apply to skin irritation once daily, Normal, Disp-1 Bottle, R-1      DIGOX 125 mcg tablet TAKE 1 TABLET BY MOUTH DAILY, Normal, Disp-90 tablet, R-0      amLODIPine (NORVASC) 5 mg tablet TAKE 1 TABLET BY MOUTH DAILY, Normal, Disp-90 tablet, R-0      lovastatin (MEVACOR) 20 mg tablet Take 1 Tab by mouth nightly., Normal, Disp-90 Tab, R-1      tamsulosin (FLOMAX) 0.4 mg capsule Take 1 Cap by mouth daily., Normal, Disp-90 Cap, R-3      dutasteride (AVODART) 0.5 mg capsule Take 1 Cap by mouth daily., Mail Order, Disp-90 Cap, R-3      omega-3 fatty acids-vitamin e (FISH OIL) 1,000 mg cap Take 2 capsules by mouth daily., Historical Med        !! - Potential duplicate medications found. Please discuss with provider.          Admission imaging studies:      Results from Hospital Encounter encounter on 06/12/13   XR CHEST PORT   Narrative **Final Report**      ICD Codes / Adm.Diagnosis: 12  753.10 / Back Pain  Back pain  Examination:  CR CHEST PORT  - [id number] - Jun 12 2013  8:17PM  Accession No:  [id number]  Reason:  chest pain      REPORT:  INDICATION:  chest pain     COMPARISON: 05/02/2012  FINDINGS: Single AP portable view of the chest obtained at 2015 demonstrates   a stable cardiomediastinal silhouette. The lungs are clear bilaterally. No   osseous abnormalities are seen.       IMPRESSION: No evidence of acute cardiopulmonary process.           Signing/Reading Doctor: Paulino RilyMARK S. DIXON [phone]    ApprovedPaulino Rily: MARK S. DIXON [phone])  Jun 12 2013  8:19PM                                           No results found for this or any previous visit.             Results from Hospital Encounter encounter on 06/12/13   CT SPINE LUMB WO CONT   Narrative **Final Report**      ICD Codes / Adm.Diagnosis: 12  753.10 / Back Pain  Back pain  Examination:  CT L SPINE WO CON  - [id number] - Jun 12 2013  6:34PM  Accession No:  [id number]  Reason:  trauma      REPORT:  EXAM:  CT LUMBAR SPINE WITHOUT  CONTRAST    INDICATION:  trauma     COMPARISON: Radiographs of 12/05/2012.    TECHNIQUE: Multislice helical CT of the lumbar spine was performed without   intravenous contrast administration.  Coronal and sagittal reconstructions   were generated.    FINDINGS:  There is diffuse osteoporosis. There is mild anterior compression fracture   of L2 which is unchanged since 12/05/2012. The remainder of the lumbar   vertebrae demonstrate no significant compression.  The alignment is within normal limits. No acute fracture is demonstrated.   Posterior elements are intact. The paravertebral soft tissues are within   normal limits.    Lower thoracic spine:  The spinal canal and neural foramina are  widely   patent.    L1-L2:  Moderate spondylosis and disc bulge. Bilateral facet hypertrophy.   Moderate central spinal stenosis.Marland Kitchen.    L2-L3:  Spondylosis and loss of height of the disc. Mild facet hypertrophy.   No significant central stenosis.Marland Kitchen.    L3-L4:  Moderately severe spondylosis, asymmetric to the left. Mild to   moderate facet hypertrophy. Mild central spinal stenosis. Bilateral   foraminal stenosis, left greater than right. Probable left central disc   herniation with caudal extrusion of disc material which extends to the left   lateral recess of L4, with impingement of the left L4 nerve root.    L4-L5:  Spondylosis with asymmetry to the left. Bilateral facet and   ligamentous hypertrophy with moderately severe central spinal stenosis. Left   foraminal stenosis.Marland Kitchen.    L5-S1:  Mild/moderate bulging disc. Bilateral facet hypertrophy. No   significant central or foraminal stenosis..       IMPRESSION:  1. Mild chronic compression fracture of L2. No acute fractures demonstrated.   Diffuse osteoporosis.  2. Multilevel degenerative changes as described in detail above. Spinal   stenosis at L4-5.  3. Suspect left L3-4 disc herniation with caudal extrusion of disc material   impinging the left L4 nerve root in the left lateral recess.           Signing/Reading Doctor: Richardson LandryLORI V. SMITHSON [phone]([id number])    Approved: Richardson LandryLORI V. SMITHSON [phone])  Jun 12 2013  6:47PM  No procedure found.      -------------------------------------------------------------------------------------------------------------------    Chronic Diagnoses:    Problem List as of 06/14/2013 Date Reviewed: 04/21/2013        ICD-9-CM Class Noted - Resolved    Lumbar compression fracture 805.4  06/14/2013 - Present        Lumbar disc herniation with radiculopathy 722.10  06/14/2013 - Present        Lipid disorder 272.9  06/26/2012 - Present        Abnormal CT of the abdomen 793.6  05/02/2012 - Present    Overview    Addendum  10/17/2012  1:49 PM by Jennye Boroughs, MD      04/2012:  5.3 x 7.4 cm complex cyst left kidney. Followup recommended in   4-6 months time to ensure stability. No acute abnormality.  Done and stable 09/2012            BPH (benign prostatic hyperplasia) 600.90  10/12/2011 - Present        HTN (hypertension) 401.9  10/12/2011 - Present        S/P colonoscopy V45.89  10/12/2011 - Present    Overview    Signed 10/12/2011  1:30 PM by Jennye Boroughs, MD      He had one in the past that was normal per patient 1990s          H/O knee surgery V45.89  10/12/2011 - Present    Overview    Signed 10/12/2011  1:29 PM by Jennye Boroughs, MD      Left knee age 38          Hernia 553.9  10/12/2011 - Present    Overview    Signed 10/12/2011  1:29 PM by Jennye Boroughs, MD      Umbilical repair            Paroxysmal atrial fibrillation 427.31  07/04/2007 - Present    Overview    Signed 10/12/2011  1:21 PM by Jennye Boroughs, MD      Dr. Lorella Nimrod  On coumadin          BCC (basal cell carcinoma of skin) 173.91  07/04/2007 - Present    Overview    Signed 10/12/2011  1:27 PM by Jennye Boroughs, MD      Sees derm Dr. Billie Ruddy yearly          Resting tremor 781.0  07/04/2007 - Present    Overview    Signed 10/12/2011  1:26 PM by Jennye Boroughs, MD      Saw neurologist 2005, no treatment          RESOLVED: Anemia 285.9  05/02/2013 - 06/12/2013    Overview    Signed 05/02/2013  7:59 AM by Jennye Boroughs, MD      Needs FOBT and documentation of colonoscopy          RESOLVED: Hyponatremia 276.1  05/02/2013 - 06/12/2013    Overview    Signed 05/02/2013  7:59 AM by Jennye Boroughs, MD      Consider switch maxzide          RESOLVED: BPH 600.00  07/04/2007 - 10/12/2011                Signed:      [name], DO   Family Medicine Resident      06/14/2013   7:47 AM     Dr. Frann Rider, MD   [facility]  Medicine Attending      Cc: Jennye Boroughs, MD

## 2013-06-15 DIAGNOSIS — K5900 Constipation, unspecified: Secondary | ICD-10-CM | POA: Diagnosis not present

## 2013-06-15 DIAGNOSIS — I4891 Unspecified atrial fibrillation: Secondary | ICD-10-CM | POA: Diagnosis not present

## 2013-06-15 DIAGNOSIS — M5126 Other intervertebral disc displacement, lumbar region: Secondary | ICD-10-CM | POA: Diagnosis not present

## 2013-06-15 LAB — AMB POC PT/INR
INR POC: 3
Prothrombin time (POC): 35.5 s

## 2013-06-15 MED ORDER — OXYCODONE-ACETAMINOPHEN 5 MG-325 MG TAB
5-325 mg | ORAL_TABLET | ORAL | Status: DC | PRN
Start: 2013-06-15 — End: 2013-07-04

## 2013-06-15 NOTE — Progress Notes (Signed)
Cory Dalton is a 80 y.o. male who presents for hospital followup.    1) Lumbar Disc Herniation   Radicular pain, pt presented as above. Improved with high dose steroids and narcotics. Symptoms were confirmed with MRI which revealed. Disc bulge at L4-5 with left paracentral approximately 2 cm superior disc extrusion. Also with various areas of stenosis.   Prednisone 60 mg x 5 days, 4 days remaining  Percocet 5/325, every four hours, as needed for pain, #15. Every 4 hours.  Follow up with Ortho in 1-2 weeks.     2) Supra-therapeutic INR   Incidental finding with INR of 4.0, no new antibiotics were started. Advised to hold dose and re-check INR tomorrow with [name], MD.  Taking coumadin  2.5 mg every night.   Coumadin = 3.0 today      3)Constipation   Treated with 2 glycerine suppositories and started on an outpatient bowel regimen. Advised ambulation as tolerated.   Docusate, take 100 mg tab two times per day.   Miralax, take 17g, as needed when constipated.   Improved since discharge    No additional complaints since hospital discharge. Back pain improving. Constipation improving. No c/o bleeding or complications of supratherapeutic INR.    PMHx:  Past Medical History   Diagnosis Date   ??? Paroxysmal atrial fibrillation 07/04/2007   ??? BPH 07/04/2007   ??? Resting tremor 07/04/2007   ??? BCC (basal cell carcinoma of skin) 07/04/2007   ??? Atrial fibrillation    ??? Skin cancer    ??? Hypertension        Meds:   Current Outpatient Prescriptions   Medication Sig Dispense Refill   ??? oxyCODONE-acetaminophen (PERCOCET) 5-325 mg per tablet Take 1 tablet by mouth every four (4) hours as needed for Pain.  15 tablet  0   ??? docusate sodium (COLACE) 100 mg capsule Take 1 capsule by mouth two (2) times a day for 90 days.  60 capsule  2   ??? polyethylene glycol (MIRALAX) 17 gram/dose powder Take 17 g by mouth daily.  17 g  0   ??? predniSONE (DELTASONE) 20 mg tablet Take 3 tablets by mouth daily for 5 days.  15 tablet  0   ??? warfarin  (COUMADIN) 2.5 mg tablet Take 1 tablet by mouth every evening for 30 days.  30 tablet  0   ??? metoprolol (LOPRESSOR) 25 mg tablet TAKE 1 TABLET BY MOUTH EVERY 12 HOURS  180 tablet  0   ??? warfarin (COUMADIN) 2.5 mg tablet Take 2.5 mg by mouth daily.       ??? acetaminophen (TYLENOL) 500 mg tablet Take 1,000 mg by mouth every six (6) hours as needed for Pain.       ??? triamterene-hydrochlorothiazide (MAXZIDE) 37.5-25 mg per tablet TAKE 1 TABLET BY MOUTH EVERY DAY  90 tablet  3   ??? clobetasol (TEMOVATE) 0.05 % external solution Apply to skin irritation once daily  1 Bottle  1   ??? DIGOX 125 mcg tablet TAKE 1 TABLET BY MOUTH DAILY  90 tablet  0   ??? amLODIPine (NORVASC) 5 mg tablet TAKE 1 TABLET BY MOUTH DAILY  90 tablet  0   ??? lovastatin (MEVACOR) 20 mg tablet Take 1 Tab by mouth nightly.  90 Tab  1   ??? tamsulosin (FLOMAX) 0.4 mg capsule Take 1 Cap by mouth daily.  90 Cap  3   ??? dutasteride (AVODART) 0.5 mg capsule Take 1 Cap by mouth daily.  90 Cap  3   ??? omega-3 fatty acids-vitamin e (FISH OIL) 1,000 mg cap Take 2 capsules by mouth daily.         Facility-Administered Medications Ordered in Other Visits   Medication Dose Route Frequency Provider Last Rate Last Dose   ??? [COMPLETED] glycerin (adult) suppository 1 suppository  1 suppository Rectal NOW Cory Chiquito, DO   1 suppository at 06/14/13 1201   ??? [COMPLETED] pneumococcal 23-valent (PNEUMOVAX 23) injection 0.5 mL  0.5 mL IntraMUSCular PRIOR TO DISCHARGE Cory Points, MD   0.5 mL at 06/14/13 1204   ??? [DISCONTINUED] amLODIPine (NORVASC) tablet 5 mg  5 mg Oral DAILY Cory Guard, MD   5 mg at 06/14/13 0950   ??? [DISCONTINUED] digoxin (LANOXIN) tablet 0.125 mg  0.125 mg Oral DAILY Cory Guard, MD   0.125 mg at 06/14/13 0950   ??? [DISCONTINUED] dutasteride (AVODART) capsule 0.5 mg  0.5 mg Oral DAILY Cory Guard, MD   0.5 mg at 06/14/13 0950   ??? [DISCONTINUED] lovastatin (MEVACOR) tablet 20 mg  20 mg Oral QHS Cory Guard, MD   20 mg at  06/13/13 2121   ??? [DISCONTINUED] tamsulosin (FLOMAX) capsule 0.4 mg  0.4 mg Oral DAILY Cory Guard, MD   0.4 mg at 06/14/13 0950   ??? [DISCONTINUED] triamterene-hydrochlorothiazide (MAXZIDE) 37.5-25 mg per tablet 1 tablet  1 tablet Oral DAILY Cory Guard, MD   1 tablet at 06/14/13 0950   ??? [DISCONTINUED] warfarin (COUMADIN) tablet 2.5 mg  2.5 mg Oral QPM Cory Guard, MD   2.5 mg at 06/13/13 1827   ??? [DISCONTINUED] sodium chloride (NS) flush 5-10 mL  5-10 mL IntraVENous Q8H Cory Guard, MD   10 mL at 06/14/13 1610   ??? [DISCONTINUED] sodium chloride (NS) flush 5-10 mL  5-10 mL IntraVENous PRN Cory Guard, MD       ??? [DISCONTINUED] morphine injection 2 mg  2 mg IntraVENous Q4H PRN Cory Guard, MD   2 mg at 06/13/13 9604   ??? [DISCONTINUED] oxyCODONE-acetaminophen (PERCOCET) 5-325 mg per tablet 1 tablet  1 tablet Oral Q4H PRN Cory Guard, MD   1 tablet at 06/14/13 1047   ??? [DISCONTINUED] ondansetron (ZOFRAN) injection 4 mg  4 mg IntraVENous Q4H PRN Cory Guard, MD       ??? [DISCONTINUED] docusate sodium (COLACE) capsule 100 mg  100 mg Oral BID Cory Guard, MD   100 mg at 06/14/13 0950   ??? [DISCONTINUED] polyethylene glycol (MIRALAX) packet 17 g  17 g Oral DAILY Cory Guard, MD   17 g at 06/14/13 0951   ??? [DISCONTINUED] Warfarin: Chronic coumadin patient   Other PRN Cory Guard, MD       ??? [DISCONTINUED] clobetasol (TEMOVATE) 0.05 % ointment   Topical DAILY Cory Stanford, MD       ??? [DISCONTINUED] metoprolol (LOPRESSOR) tablet 25 mg  25 mg Oral Q12H Cory Stanford, MD   25 mg at 06/14/13 5409       Allergies:   No Known Allergies    Smoker:  History   Smoking status   ??? Never Smoker    Smokeless tobacco   ??? Never Used       ETOH:   History   Alcohol Use   ??? 1.5 oz/week   ??? 3 Glasses of wine per week     Comment: 3 er week  FH:   Family History   Problem Relation Age of Onset   ??? Hypertension Mother    ??? Cancer Father      ?prostate  cancer, bone cancer       ROS:  General/Constitutional:   No headache, fever, fatigue  Eyes:   No redness, pruritis, pain, visual changes, swelling, or discharge      Ears:    No pain, loss or changes in hearing     Neck:   No swelling, masses, stiffness, pain, or limited movement     Cardiac:    No chest pain      Respiratory:   No cough or shortness of breath     GI:   No nausea/vomiting, diarrhea, abdominal pain, bloody or dark stools       GU:   No dysuria  Neurological:   No loss of consciousness, dizziness, balance, or unilateral weakness     Skin: No rash     Physical Exam:  BP 120/75   Pulse 81   Temp(Src) 97.5 ??F (36.4 ??C) (Oral)   Resp 20   Wt 187 lb (84.823 kg)   BMI 26.09 kg/m2   SpO2 99%  General appearance: alert, cooperative, no distress, appears stated age, sitting comfortably  Head: Normocephalic, without obvious abnormality, atraumatic  Eyes: conjunctivae/corneas clear  Throat: Lips, mucosa, and tongue normal. Teeth and gums normal  Neck: supple, symmetrical, trachea midline  Back: No CVA tenderness. Limited ROM 2/2 pain  Lungs: clear to auscultation bilaterally, no wheezes, no increased work of breathing  Heart: irregular rhythm, S1, S2 normal, no murmur, click, rub or gallop  Abdomen: soft, non-tender. Bowel sounds normal. No masses,  no organomegaly  Extremities: extremities normal, atraumatic, no cyanosis or edema  Pulses: 2+ and symmetric  Skin: Skin color, texture, turgor normal. No rashes or lesions  Lymph nodes: Cervical, supraclavicular, and axillary nodes normal.  Neurologic: Alert and oriented X 3, normal strength and tone. Gait stable. + SLR on Left     Assessment/Plan:    ICD-9-CM    1. Paroxysmal atrial fibrillation 427.31 AMB POC PT/INR   2. Lumbar disc herniation with radiculopathy 722.10 oxyCODONE-acetaminophen (PERCOCET) 5-325 mg per tablet     1. pAfib - INR 3.0 today, resume coumadin 2.5 mg qhs, repeat in 1 week    2. Lumbar disc herniation - pain improved on prednisone and  percocet, currently using perccet q4-6h. Advise to space as tolerated. Refill given until seen by orthopedist, scheduling f/u w/ Dr. Elyn PeersYork.    3. Constipation - improving, continue current regimen (colace, miralax PRN)    Recheck INR in 1 week, RTC sooner PRN    Patient discussed with Dr. Sanda KleinSherrod, FM Attending    Sanjuana Maeavid Chantille Navarrete MD  PGY 2  [facility]  06/15/2013

## 2013-06-15 NOTE — Progress Notes (Signed)
I reviewed with the resident the medical history and the resident's findings on the physical examination.  I discussed with the resident the patient's diagnosis and concur with the plan.

## 2013-06-15 NOTE — Addendum Note (Signed)
Addended by: Billey CoURNER, SHERYL C on: 06/15/2013 04:57 PM     Modules accepted: Orders

## 2013-06-15 NOTE — Progress Notes (Signed)
Chief Complaint   Patient presents with   ??? Hospital Follow Up   ??? Coagulation disorder     1. Have you been to the ER, urgent care clinic since your last visit?  Hospitalized since your last visit?Yes When: 06/12/13 [facility] Reason for visit: hospitalized    2. Have you seen or consulted any other health care providers [facility] since your last visit?  Include any pap smears or colon screening. No

## 2013-06-15 NOTE — Patient Instructions (Signed)
Sciatica: After Your Visit  Your Care Instructions  Sciatica (say "sye-AT-ih-kuh") is an irritation of one of the sciatic nerves, which come from the spinal cord in the lower back. The sciatic nerves and their branches extend down through the buttock to the foot. Sciatica can develop when an injured disc in the back presses against a spinal nerve root. Its main symptom is pain, numbness, or weakness that is often worse in the leg or foot than in the back.  Sciatica often will improve and go away with time. Early treatment usually includes medicines and exercises to relieve pain.  Follow-up care is a key part of your treatment and safety. Be sure to make and go to all appointments, and call your doctor if you are having problems. It???s also a good idea to know your test results and keep a list of the medicines you take.  How can you care for yourself at home?  ?? Take pain medicines exactly as directed.  ?? If the doctor gave you a prescription medicine for pain, take it as prescribed.  ?? If you are not taking a prescription pain medicine, ask your doctor if you can take an over-the-counter medicine.  ?? Put ice or a cold pack on the middle of your lower back for 10 to 20 minutes several times each day. Put a thin cloth between the ice and your skin.  ?? After the first 2 or 3 days, use a warm pack or heating pad for 20 minutes at a time. Hot showers will also help. Hot baths may help as long as you can lie or sit in a position that does not stress your back. You may also keep using ice if it helps.  ?? Avoid sitting if possible, unless it feels better than standing.  ?? Alternate lying down with short walks. Increase your walking distance as you are able to without making your symptoms worse.  ?? Do not do anything that makes your symptoms worse.  When should you call for help?  Call 911 anytime you think you may need emergency care. For example, call if:  ?? You have sudden weakness or numbness in both legs.  ?? You lose  bowel or bladder control.  ?? You suddenly cannot walk or stand.  Call your doctor now or seek immediate medical care if:  ?? You have weakness in your ankle or leg.  ?? You have new pain, numbness, tingling, or weakness, especially in the buttocks, genital or rectal area, legs, or feet.  ?? You have symptoms of a urinary infection. For example:  ?? You have blood or pus in your urine.  ?? You have pain in your back just below your rib cage. This is called flank pain.  ?? You have a fever, chills, or body aches.  ?? It hurts to urinate.  ?? You have groin or belly pain.  Watch closely for changes in your health, and be sure to contact your doctor if:  ?? Your back pain gets worse or more frequent.  ?? Your back pain is not getting better after 1 week of home treatment. It may take a lot longer for the pain to go away completely, but it should feel at least a little better.   Where can you learn more?   Go to http://www.healthwise.net/[facility]  Enter Z239 in the search box to learn more about "Sciatica: After Your Visit."   ?? 2006-2014 Healthwise, Incorporated. Care instructions adapted under license by [employer] (  which disclaims liability or warranty for this information). This care instruction is for use with your licensed healthcare professional. If you have questions about a medical condition or this instruction, always ask your healthcare professional. Healthwise, Incorporated disclaims any warranty or liability for your use of this information.  Content Version: 10.2.346038; Current as of: October 25, 2012

## 2013-06-16 DIAGNOSIS — I10 Essential (primary) hypertension: Secondary | ICD-10-CM | POA: Diagnosis not present

## 2013-06-16 DIAGNOSIS — M5137 Other intervertebral disc degeneration, lumbosacral region: Secondary | ICD-10-CM | POA: Diagnosis not present

## 2013-06-16 DIAGNOSIS — IMO0001 Reserved for inherently not codable concepts without codable children: Secondary | ICD-10-CM | POA: Diagnosis not present

## 2013-06-16 DIAGNOSIS — R259 Unspecified abnormal involuntary movements: Secondary | ICD-10-CM | POA: Diagnosis not present

## 2013-06-16 DIAGNOSIS — Z602 Problems related to living alone: Secondary | ICD-10-CM | POA: Diagnosis not present

## 2013-06-16 DIAGNOSIS — N400 Enlarged prostate without lower urinary tract symptoms: Secondary | ICD-10-CM | POA: Diagnosis not present

## 2013-06-16 DIAGNOSIS — I4891 Unspecified atrial fibrillation: Secondary | ICD-10-CM | POA: Diagnosis not present

## 2013-06-16 DIAGNOSIS — M545 Low back pain, unspecified: Secondary | ICD-10-CM | POA: Diagnosis not present

## 2013-06-18 NOTE — Progress Notes (Signed)
Quick Note:    INR 3.0, reviewed w/ pt, recheck in 1 week.  ______

## 2013-06-20 DIAGNOSIS — M4180 Other forms of scoliosis, site unspecified: Secondary | ICD-10-CM | POA: Diagnosis not present

## 2013-06-20 DIAGNOSIS — Z006 Encounter for examination for normal comparison and control in clinical research program: Secondary | ICD-10-CM | POA: Diagnosis not present

## 2013-06-20 DIAGNOSIS — M5126 Other intervertebral disc displacement, lumbar region: Secondary | ICD-10-CM | POA: Diagnosis not present

## 2013-06-20 DIAGNOSIS — M5137 Other intervertebral disc degeneration, lumbosacral region: Secondary | ICD-10-CM | POA: Diagnosis not present

## 2013-06-20 DIAGNOSIS — M48062 Spinal stenosis, lumbar region with neurogenic claudication: Secondary | ICD-10-CM | POA: Diagnosis not present

## 2013-06-20 DIAGNOSIS — M47817 Spondylosis without myelopathy or radiculopathy, lumbosacral region: Secondary | ICD-10-CM | POA: Diagnosis not present

## 2013-06-21 DIAGNOSIS — I10 Essential (primary) hypertension: Secondary | ICD-10-CM | POA: Diagnosis not present

## 2013-06-21 DIAGNOSIS — R259 Unspecified abnormal involuntary movements: Secondary | ICD-10-CM | POA: Diagnosis not present

## 2013-06-21 DIAGNOSIS — M5137 Other intervertebral disc degeneration, lumbosacral region: Secondary | ICD-10-CM | POA: Diagnosis not present

## 2013-06-21 DIAGNOSIS — I4891 Unspecified atrial fibrillation: Secondary | ICD-10-CM | POA: Diagnosis not present

## 2013-06-21 DIAGNOSIS — M545 Low back pain, unspecified: Secondary | ICD-10-CM | POA: Diagnosis not present

## 2013-06-21 DIAGNOSIS — IMO0001 Reserved for inherently not codable concepts without codable children: Secondary | ICD-10-CM | POA: Diagnosis not present

## 2013-06-22 DIAGNOSIS — R0602 Shortness of breath: Secondary | ICD-10-CM | POA: Diagnosis not present

## 2013-06-22 DIAGNOSIS — I10 Essential (primary) hypertension: Secondary | ICD-10-CM | POA: Diagnosis not present

## 2013-06-22 DIAGNOSIS — Z7901 Long term (current) use of anticoagulants: Secondary | ICD-10-CM | POA: Diagnosis not present

## 2013-06-22 DIAGNOSIS — I4891 Unspecified atrial fibrillation: Secondary | ICD-10-CM | POA: Diagnosis not present

## 2013-06-22 DIAGNOSIS — I059 Rheumatic mitral valve disease, unspecified: Secondary | ICD-10-CM | POA: Diagnosis not present

## 2013-06-22 LAB — AMB POC PT/INR
INR POC: 3.2
Prothrombin time (POC): 38.5 s

## 2013-06-22 NOTE — Progress Notes (Signed)
CARDIOLOGY OFFICE NOTE    Clancey Welton A. Sayge Brienza, MD, [facility]    [address], [place], [place] [id number]  Phone [phone]; Fax [phone]  Mobile [phone]   Voice Mail [phone]    No admission date for patient encounter.  Jennye BoroughsPaul V Jackson, MD    DOB:  Nov 27, 1926   MRN:  [id number]     Subjective             Seleta RhymesJean O Daigler is a 80 y.o. male I am seeing for hypertension, hyperlipidemia and Atrial Fibrillation. Symptoms include: chest pain  Cardiac risk factors: male gender, hypertension, dyslipidemia.I have personally obtained the history from the patient.I have no sen Mr. Wadie LessenLinden since 2009. [place] now back. He appears to be in chronic atrial fibrillation now.   He has no complaints today from a cardiac standpoint. He is short of breath. He  Is in a lot of pain.      No Known Allergies      Past Medical History   Diagnosis Date   ??? Paroxysmal atrial fibrillation 07/04/2007   ??? BPH 07/04/2007   ??? Resting tremor 07/04/2007   ??? BCC (basal cell carcinoma of skin) 07/04/2007   ??? Atrial fibrillation    ??? Skin cancer    ??? Hypertension         Past Surgical History   Procedure Laterality Date   ??? Hx hernia repair  04/2010   ??? Hx cataract removal         Home Medications:  Current Outpatient Prescriptions   Medication Sig   ??? metoprolol (LOPRESSOR) 50 mg tablet Take 25 mg by mouth two (2) times a day.   ??? oxyCODONE-acetaminophen (PERCOCET) 5-325 mg per tablet Take 1 tablet by mouth every four (4) hours as needed for Pain.   ??? oxyCODONE-acetaminophen (PERCOCET) 5-325 mg per tablet Take 1 tablet by mouth every four (4) hours as needed for Pain.   ??? docusate sodium (COLACE) 100 mg capsule Take 1 capsule by mouth two (2) times a day for 90 days.   ??? polyethylene glycol (MIRALAX) 17 gram/dose powder Take 17 g by mouth daily.   ??? warfarin (COUMADIN) 2.5 mg tablet Take 1 tablet by mouth every evening for 30 days.   ??? acetaminophen (TYLENOL) 500 mg tablet Take 1,000 mg by mouth  every six (6) hours as needed for Pain.   ??? triamterene-hydrochlorothiazide (MAXZIDE) 37.5-25 mg per tablet TAKE 1 TABLET BY MOUTH EVERY DAY   ??? clobetasol (TEMOVATE) 0.05 % external solution Apply to skin irritation once daily   ??? DIGOX 125 mcg tablet TAKE 1 TABLET BY MOUTH DAILY   ??? amLODIPine (NORVASC) 5 mg tablet TAKE 1 TABLET BY MOUTH DAILY   ??? lovastatin (MEVACOR) 20 mg tablet Take 1 Tab by mouth nightly.   ??? tamsulosin (FLOMAX) 0.4 mg capsule Take 1 Cap by mouth daily.   ??? dutasteride (AVODART) 0.5 mg capsule Take 1 Cap by mouth daily.   ??? omega-3 fatty acids-vitamin e (FISH OIL) 1,000 mg cap Take 2 capsules by mouth daily.     No current facility-administered medications for this visit.             Objective:   Laboratory and Imaging have been reviewed by me and are notable for        Lab Results   Component Value Date/Time    INR POC 3.0 06/15/2013 11:20 AM    INR 4.0  06/14/2013  4:26 AM    INR 2.5 06/13/2013  2:40 AM    INR 2.5 06/12/2013  7:42 PM    INR POC 2.0 05/31/2013  2:25 PM    INR POC 2.0 05/01/2013  9:45 AM    Prothrombin time 42.8 06/14/2013  4:26 AM    Prothrombin time 25.8 06/13/2013  2:40 AM    Prothrombin time 26.0 06/12/2013  7:42 PM               Cardiac work up to date:      PROCEDURES                                           DATE       1.  Echocardiogram                 (04/13/07)  EF 55-60%, BAE, Mild TR with    12/2103echocardiogram:  SUMMARY:  Left ventricle: Systolic function was normal. Ejection fraction was  estimated to be 65 %. There were no regional wall motion abnormalities.    Right ventricle: The ventricle was mildly to moderately dilated. Systolic  function was mildly reduced.    Left atrium: The atrium was markedly dilated.    Atrial septum: The septum bows from right to left, consistent with  increased right atrial pressure.    Right atrium: The atrium was moderately to markedly dilated.    Mitral valve: There was mild to moderate regurgitation.    Tricuspid valve: There was severe  regurgitation. There was moderate to  severe pulmonary hypertension.    Inferior vena cava, hepatic veins: The inferior vena cava was mildly  dilated. Respirophasic changes were blunted (less than 50% variation).       2. Cholesterol profile                         Lab Results   Component Value Date/Time    Cholesterol, total 146 05/01/2013 12:05 PM    HDL Cholesterol 80 05/01/2013 12:05 PM    LDL, calculated 58 05/01/2013 12:05 PM    VLDL, calculated 8 05/01/2013 12:05 PM    Triglyceride 39 05/01/2013 12:05 PM          Social History:  History   Substance Use Topics   ??? Smoking status: Never Smoker    ??? Smokeless tobacco: Never Used   ??? Alcohol Use: 1.5 oz/week     3 Glasses of wine per week      Comment: 3 er week       Family History:  Family History   Problem Relation Age of Onset   ??? Hypertension Mother    ??? Cancer Father      ?prostate cancer, bone cancer       Review of Symptoms:  A comprehensive review of systems ,particularly related to cardiovascular system, was negative except for that written in the HPI.    Physical Exam:    BP 110/70   Pulse 80   Ht 5\' 11"  (1.803 m)   Wt 184 lb (83.462 kg)   BMI 25.67 kg/m2    Physical Exam:  Gen: Well-developed, well-nourished, in no acute distress  alert and oriented x 3  HEENT:  Pink conjunctivae, Hearing grossly normal.No scleral icterus or conjunctival, moist mucous membranes  Neck: Supple,No JVD, No Carotid Bruit, Thyroid- non  tender No cervical lymphadenopathy  Resp: No accessory muscle use, Clear breath sounds, No rales or rhonchi  Card: Irregular Rate,Rythm,Normal S1, S2, 1/6 systolic murmurs, rubs or gallop. No thrills.     MSK: No cyanosis or clubbing, good capillary refill  Skin: No rashes or ulcers, no bruising  Neuro:  Cranial nerves are grossly intact, moving all four extremities, no focal deficit, follows commands appropriately  Psych:  Good insight, oriented to person, place and time, alert, Nml Affect  LE: <1+ edema  Vascular: Distal Pulses 2+ and symmetric         Medication change on this office visit: none    Assessment:   ?? Atrial fibrillation  ?? Edema possibly related to Norvasc he was started on  ?? dyslipidemia   PLAN/discussion:   ?? He remains short of breaht and this may be a component of diastolic dysfunciton as well as posible idiopathyic pulmonary HTN. I believe he is deconditioned as well.  ?? Could try diuretics if sxs worsen  ?? LV function is good but he as mod to severe TR and does not want sleep study  ?? Cholesterol is at goal  ?? Return in 26mo or PRN  ?? > 35 min in reviewing echo with family form 2005 to now and going over echo today as well as discussion regarding diastolic and systolic dysfunction.  ??    ?? I have extensively reviewed all testing with the patient.    I have discussed the diagnosis with the patient and the intended plan as seen in the above orders.  Questions were answered concerning future plans.  I have discussed medication side effects and warnings with the patient as well.    JACKOB CROOKSTON is in agreement to the plan listed above and wishes to proceed.     he  was instructed not to smoke, eat heart healthy diet  and to exercise.     Thank you for this consult.    Rushie Chestnut, MD

## 2013-06-29 DIAGNOSIS — I4891 Unspecified atrial fibrillation: Secondary | ICD-10-CM | POA: Diagnosis not present

## 2013-06-29 LAB — AMB POC PT/INR
INR POC: 1.8
Prothrombin time (POC): 21.2 s

## 2013-07-03 DIAGNOSIS — I10 Essential (primary) hypertension: Secondary | ICD-10-CM | POA: Diagnosis not present

## 2013-07-03 DIAGNOSIS — IMO0001 Reserved for inherently not codable concepts without codable children: Secondary | ICD-10-CM | POA: Diagnosis not present

## 2013-07-03 DIAGNOSIS — M545 Low back pain, unspecified: Secondary | ICD-10-CM | POA: Diagnosis not present

## 2013-07-03 DIAGNOSIS — R259 Unspecified abnormal involuntary movements: Secondary | ICD-10-CM | POA: Diagnosis not present

## 2013-07-03 DIAGNOSIS — M5137 Other intervertebral disc degeneration, lumbosacral region: Secondary | ICD-10-CM | POA: Diagnosis not present

## 2013-07-03 DIAGNOSIS — I4891 Unspecified atrial fibrillation: Secondary | ICD-10-CM | POA: Diagnosis not present

## 2013-07-04 ENCOUNTER — Telehealth

## 2013-07-04 DIAGNOSIS — S92109A Unspecified fracture of unspecified talus, initial encounter for closed fracture: Secondary | ICD-10-CM | POA: Diagnosis not present

## 2013-07-04 DIAGNOSIS — T1490XA Injury, unspecified, initial encounter: Secondary | ICD-10-CM | POA: Diagnosis not present

## 2013-07-04 NOTE — Patient Instructions (Addendum)
[employer]- Geophysicist/field seismologist program for elderly- free or discount , swimming pool access.     Ankle Sprain: Rehab Exercises  Your Care Instructions  Here are some examples of typical rehabilitation exercises for your condition. Start each exercise slowly. Ease off the exercise if you start to have pain.  Your doctor or physical therapist will tell you when you can start these exercises and which ones will work best for you.  How to do the exercises  "Alphabet" exercise    1. Trace the alphabet with your toe. This helps your ankle move in all directions.  Side-to-side knee swing exercise    1. Sit in a chair with your foot flat on the floor.  2. Slowly move your knee from side to side. Keep your foot pressed flat.  3. Continue this exercise for 2 to 3 minutes.  Towel curl    1. While sitting, place your foot on a towel on the floor. Scrunch the towel toward you with your toes.  2. Then use your toes to push the towel away from you.  3. To make this exercise more challenging you can put something on the other end of the towel. A can of soup is about the right weight for this.  Towel stretch    1. Sit with your legs extended and knees straight.  2. Place a towel around your foot just under the toes.  3. Hold each end of the towel in each hand, with your hands above your knees.  4. Pull back with the towel so that your foot stretches toward you.  5. Hold the position for at least 15 to 30 seconds.  6. Repeat 2 to 4 times a session. Do up to 5 sessions a day.  Ankle eversion exercise    1. Start by sitting with your foot flat on the floor. Push your foot outward against a wall or a piece of furniture that doesn't move.  2. After you feel comfortable with this, try using rubber tubing. Loop it around the outside of your feet for resistance. Rubber tubing is also called surgical tubing. It's like a big rubber band. When held firmly on one end, it offers resistance as you stretch the other side.  Isometric opposition exercises     1. While sitting, put your feet together flat on the floor. Press your injured foot inward against your other foot.  2. Then place the heel of your other foot on top of the injured one. Push down with the top heel while trying to push up with your injured foot.  Resisted ankle inversion    1. Sit on the floor with your good leg crossed over your other leg.  2. Hold both ends of an exercise band and loop the band around the inside of your affected foot. Then press your other foot against the band.  3. Keeping your legs crossed, slowly push your affected foot against the band so that foot moves away from your other foot. Then slowly relax.  4. Repeat 8 to 12 times.  Resisted ankle eversion    1. Sit on the floor with your legs straight.  2. Hold both ends of an exercise band and loop the band around the outside of your affected foot. Then press your other foot against the band.  3. Keeping your leg straight, slowly push your affected foot outward against the band and away from your other foot without letting your leg rotate. Then slowly relax.  4.  Repeat 8 to 12 times.  Resisted ankle dorsiflexion    1. Tie the ends of an exercise band together to form a loop. Attach one end of the loop to a secure object or shut a door on it to hold it in place. (Or you can have someone hold one end of the loop to provide resistance.)  2. While sitting on the floor or in a chair, loop the other end of the band over the top of your affected foot.  3. Keeping your knee and leg straight, slowly flex your foot to pull back on the exercise band, and then slowly relax.  4. Repeat 8 to 12 times.  Single-leg balance    1. Stand on a flat surface with your arms stretched out to your sides like you are making the letter "T." Then lift your good leg off the floor, bending it at the knee. If you are not steady on your feet, use one hand to hold on to a chair, counter, or wall.  2. Standing on the leg with your affected ankle, keep that knee  straight. Try to balance on that leg for up to 30 seconds. Then rest for up to 10 seconds.  3. Repeat 6 to 8 times.  4. When you can balance on your affected leg for 30 seconds with your eyes open, try to balance on it with your eyes closed.  When you can do this exercise with your eyes closed for 30 seconds and with ease and no pain, try standing on a pillow or piece of foam, and repeat steps 1 through 4.  Follow-up care is a key part of your treatment and safety. Be sure to make and go to all appointments, and call your doctor if you are having problems. It's also a good idea to know your test results and keep a list of the medicines you take.   Where can you learn more?   Go to [email or web address].huhttp://www.healthwise.net/[facility]  Enter X002 in the search box to learn more about "Ankle Sprain: Rehab Exercises."   ?? 2006-2014 Healthwise, Incorporated. Care instructions adapted under license by [employer] (which disclaims liability or warranty for this information). This care instruction is for use with your licensed healthcare professional. If you have questions about a medical condition or this instruction, always ask your healthcare professional. Healthwise, Incorporated disclaims any warranty or liability for your use of this information.  Content Version: 10.2.346038; Current as of: October 25, 2012

## 2013-07-04 NOTE — Progress Notes (Signed)
[name] is a 80 y.o. year old male who had concerns including Ankle Injury.    HPI:  Chief Complaint   Patient presents with   ??? Ankle Injury: wants xray of left ankle. Marland Kitchen.   Hx of spinal problems and leg gives out on him.   He was walking, ankle twisted inward, Painful, but able to walk on it immediately after injury. Able to bear weight, but some swelling. No knee pain, he did not hit his head or have LOC prior to or after event. No presyncopal sx.     left  times 10 days ago       Past Medical History   Diagnosis Date   ??? Paroxysmal atrial fibrillation 07/04/2007   ??? BPH 07/04/2007   ??? Resting tremor 07/04/2007   ??? BCC (basal cell carcinoma of skin) 07/04/2007   ??? Atrial fibrillation    ??? Skin cancer    ??? Hypertension      Current Outpatient Prescriptions   Medication Sig Dispense   ??? metoprolol (LOPRESSOR) 50 mg tablet Take 25 mg by mouth two (2) times a day.     ??? oxyCODONE-acetaminophen (PERCOCET) 5-325 mg per tablet Take 1 tablet by mouth every four (4) hours as needed for Pain.  15 tablet   ??? docusate sodium (COLACE) 100 mg capsule Take 1 capsule by mouth two (2) times a day for 90 days.  60 capsule   ??? warfarin (COUMADIN) 2.5 mg tablet Take 1 tablet by mouth every evening for 30 days.  30 tablet   ??? triamterene-hydrochlorothiazide (MAXZIDE) 37.5-25 mg per tablet TAKE 1 TABLET BY MOUTH EVERY DAY  90 tablet   ??? DIGOX 125 mcg tablet TAKE 1 TABLET BY MOUTH DAILY  90 tablet   ??? amLODIPine (NORVASC) 5 mg tablet TAKE 1 TABLET BY MOUTH DAILY  90 tablet   ??? lovastatin (MEVACOR) 20 mg tablet Take 1 Tab by mouth nightly.  90 Tab   ??? dutasteride (AVODART) 0.5 mg capsule Take 1 Cap by mouth daily.  90 Cap   ??? omega-3 fatty acids-vitamin e (FISH OIL) 1,000 mg cap Take 2 capsules by mouth daily.     ??? polyethylene glycol (MIRALAX) 17 gram/dose powder Take 17 g by mouth daily.  17 g   ??? acetaminophen (TYLENOL) 500 mg tablet Take 1,000 mg by mouth every six (6) hours as needed for Pain.     ??? clobetasol (TEMOVATE) 0.05  % external solution Apply to skin irritation once daily  1 Bottle   ??? tamsulosin (FLOMAX) 0.4 mg capsule Take 1 Cap by mouth daily.  90 Cap     No current facility-administered medications for this visit.       Reviewed PmHx, RxHx, FmHx, SocHx, AllgHx and updated and dated in the chart.      ROS: Negative except for BOLD  General: fever, chills, fatigue  Respiratory: cough, SOB, wheezing  Cardiovascular:  CP, palpitation, DOE, edema   Gastrointestinal: N/V/D, bleeding  Genito-Urinary: dysuria, hematuria  Musculoskeletal: muscle weakness, pain, swelling    OBJECTIVE:   BP 114/81    Pulse 72    Temp(Src) 95.5 ??F (35.3 ??C) (Oral)    Resp 16    Ht 5\' 11"  (1.803 m)    Wt 187 lb (84.823 kg)    BMI 26.09 kg/m2      SpO2 99%   GEN: The patient appears well, alert, oriented x 3, in no distress.   Cardiovascular: regular rate and  rhythm. S1 and S2 normal, no murmurs,  Abdomen: + BS, soft without tenderness, guarding, rebound, mass or organomegaly.   Extremities:normal peripheral pulses.   Neurological: normal, gross sensory and motor in tact without focal findings.  FROM, 2+ pitting edema, toes with bruising, nontender. No lateral or medial tenderness. Neg ant drawer of ankle.       Assessment/ Plan:   1. Injury  Pain and swelling, weight bearing. Initial xray unable to appreciate fracture. Precautions taken to repeat injury with aircast.  Air cast given high risk of reinjury. RICE, ankle exercises.     - XR ANKLE LT MIN 3 V; Future    Post patient discharge- fracture noted on xray.  Pt notified to go to [facility] for treatment. Open 5:30pm. Difficulty with connection, pt doesn't think its urgent enough to go in now. No severe pain.    Lenice Pressman-  [phone] [facility] 1st floor,  hospital, behind the gift shop.   Will have nurse call to see if appointment can be made in the morning with Ortho, otherwise recommend [facility]. Fracture precautions.     I have discussed the diagnosis with the patient and the  intended plan as seen in the above orders.  The patient has received an after-visit summary and questions were answered concerning future plans.     Medication Side Effects and Warnings were discussed with patient.    Follow-up Disposition: Not on File      Sharlene Motts, MD

## 2013-07-04 NOTE — Progress Notes (Signed)
Chief Complaint   Patient presents with   ??? Ankle Injury     left  times 10 days ago     1. Have you been to the ER, urgent care clinic since your last visit?  Hospitalized since your last visit? Yes.  [facility].    2. Have you seen or consulted any other health care providers outside of the [facility] since your last visit?  Include any pap smears or colon screening.  Yes.  Ortho.Will sign release today.

## 2013-07-04 NOTE — Telephone Encounter (Signed)
Per Dr. Wyn ForsterMadison, informed patient he has been referred to Dr. Sharl MaKerr for further evaluation of his acute fracture. Dr. Sharl MaKerr office number and address given. Advised patient to schedule an appointment as soon as possible. He voices understanding. Referral done.

## 2013-07-06 DIAGNOSIS — S92109B Unspecified fracture of unspecified talus, initial encounter for open fracture: Secondary | ICD-10-CM | POA: Diagnosis not present

## 2013-07-13 DIAGNOSIS — Z7901 Long term (current) use of anticoagulants: Secondary | ICD-10-CM | POA: Diagnosis not present

## 2013-07-13 DIAGNOSIS — I4891 Unspecified atrial fibrillation: Secondary | ICD-10-CM | POA: Diagnosis not present

## 2013-07-13 LAB — AMB POC PT/INR
INR POC: 1.9
Prothrombin time (POC): 23.1 s

## 2013-07-13 NOTE — Telephone Encounter (Signed)
Patient was here for INR today,. He will need to stop coumadin 5-7 days before March 5 spinal injection. Please call and advise patient about his.

## 2013-07-25 DIAGNOSIS — Z7901 Long term (current) use of anticoagulants: Secondary | ICD-10-CM | POA: Diagnosis not present

## 2013-07-25 DIAGNOSIS — I4891 Unspecified atrial fibrillation: Secondary | ICD-10-CM | POA: Diagnosis not present

## 2013-07-25 LAB — AMB POC PT/INR
INR POC: 1
Prothrombin time (POC): 12.4 s

## 2013-07-26 ENCOUNTER — Encounter

## 2013-07-26 DIAGNOSIS — M48061 Spinal stenosis, lumbar region without neurogenic claudication: Secondary | ICD-10-CM | POA: Diagnosis not present

## 2013-07-26 DIAGNOSIS — IMO0002 Reserved for concepts with insufficient information to code with codable children: Secondary | ICD-10-CM | POA: Diagnosis not present

## 2013-07-26 MED ORDER — AMLODIPINE 5 MG TAB
5 mg | ORAL_TABLET | ORAL | Status: DC
Start: 2013-07-26 — End: 2013-07-30

## 2013-07-26 MED ORDER — DUTASTERIDE 0.5 MG CAP
0.5 mg | ORAL_CAPSULE | Freq: Every day | ORAL | Status: DC
Start: 2013-07-26 — End: 2013-07-30

## 2013-07-26 MED ORDER — METOPROLOL TARTRATE 50 MG TAB
50 mg | ORAL_TABLET | Freq: Two times a day (BID) | ORAL | Status: DC
Start: 2013-07-26 — End: 2014-06-29

## 2013-07-26 MED ORDER — LOVASTATIN 20 MG TAB
20 mg | ORAL_TABLET | Freq: Every evening | ORAL | Status: DC
Start: 2013-07-26 — End: 2013-07-30

## 2013-07-30 ENCOUNTER — Encounter

## 2013-07-30 MED ORDER — LOVASTATIN 20 MG TAB
20 mg | ORAL_TABLET | Freq: Every evening | ORAL | Status: DC
Start: 2013-07-30 — End: 2014-01-23

## 2013-07-30 MED ORDER — DUTASTERIDE 0.5 MG CAP
0.5 mg | ORAL_CAPSULE | Freq: Every day | ORAL | Status: DC
Start: 2013-07-30 — End: 2013-08-02

## 2013-07-30 MED ORDER — TAMSULOSIN SR 0.4 MG 24 HR CAP
0.4 mg | ORAL_CAPSULE | Freq: Every day | ORAL | Status: DC
Start: 2013-07-30 — End: 2014-07-30

## 2013-07-30 MED ORDER — DIGOXIN 0.125 MG TAB
0.125 mg | ORAL_TABLET | ORAL | Status: DC
Start: 2013-07-30 — End: 2013-11-09

## 2013-07-30 MED ORDER — TRIAMTERENE-HYDROCHLOROTHIAZIDE 37.5 MG-25 MG TAB
ORAL_TABLET | ORAL | Status: DC
Start: 2013-07-30 — End: 2014-08-30

## 2013-07-30 MED ORDER — AMLODIPINE 5 MG TAB
5 mg | ORAL_TABLET | ORAL | Status: DC
Start: 2013-07-30 — End: 2014-06-02

## 2013-08-02 DIAGNOSIS — Z23 Encounter for immunization: Secondary | ICD-10-CM | POA: Diagnosis not present

## 2013-08-02 DIAGNOSIS — I10 Essential (primary) hypertension: Secondary | ICD-10-CM | POA: Diagnosis not present

## 2013-08-02 DIAGNOSIS — N400 Enlarged prostate without lower urinary tract symptoms: Secondary | ICD-10-CM | POA: Diagnosis not present

## 2013-08-02 DIAGNOSIS — Z135 Encounter for screening for eye and ear disorders: Secondary | ICD-10-CM | POA: Diagnosis not present

## 2013-08-02 DIAGNOSIS — F4321 Adjustment disorder with depressed mood: Secondary | ICD-10-CM | POA: Diagnosis not present

## 2013-08-02 MED ORDER — DUTASTERIDE 0.5 MG CAP
0.5 mg | ORAL_CAPSULE | Freq: Every day | ORAL | Status: DC
Start: 2013-08-02 — End: 2013-08-02

## 2013-08-02 MED ORDER — AVODART 0.5 MG CAPSULE
0.5 mg | ORAL_CAPSULE | ORAL | Status: DC
Start: 2013-08-02 — End: 2013-08-08

## 2013-08-02 NOTE — Progress Notes (Signed)
Chief Complaint   Patient presents with   ??? Medication Evaluation     [facility]     Wants to follow up on "all the other doctor visits"     Saw Dr. Selena BattenKim for injection in back  Saw Dr. Sharl MaKerr for fractured ankle    Reviewed record in preparation for visit and have obtained necessary documentation.    1. Have you been to the ER, urgent care clinic since your last visit?  Hospitalized since your last visit?No    2. Have you seen or consulted any other [facility] providers [facility] since your last visit?  Include any pap smears or colon screening. Yes Reason for visit: see above

## 2013-08-02 NOTE — Progress Notes (Signed)
Cory Dalton is a 80 y.o.  male  Issues discussed today include:    Follow up of chronic conditions, see diagnoses    He had his ESI one week ago and back is better    He is back on coumadin and will recheck INR next week    He needs Tdap    WELLNESS exam September      Data reviewed or ordered today:  Refer OP exam    Other problems include:  Patient Active Problem List   Diagnosis Code   ??? Paroxysmal atrial fibrillation 427.31   ??? BCC (basal cell carcinoma of skin) 173.91   ??? Resting tremor 781.0   ??? BPH (benign prostatic hyperplasia) 600.90   ??? HTN (hypertension) 401.9   ??? S/P colonoscopy V45.89   ??? H/O knee surgery V45.89   ??? Hernia 553.9   ??? Abnormal CT of the abdomen 793.6   ??? Lipid disorder 272.9   ??? Lumbar compression fracture 805.4   ??? 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 dutasteride (AVODART) 0.5 mg capsule Take 1 Cap by mouth daily.  90 Cap  3   ??? lovastatin (MEVACOR) 20 mg tablet Take 1 Tab by mouth nightly.  90 Tab  1   ??? amLODIPine (NORVASC) 5 mg tablet TAKE 1 TABLET BY MOUTH DAILY  90 Tab  1   ??? triamterene-hydrochlorothiazide (MAXZIDE) 37.5-25 mg per tablet TAKE 1 TABLET BY MOUTH EVERY DAY  90 Tab  3   ??? digoxin (DIGOX) 0.125 mg tablet TAKE 1 TABLET BY MOUTH DAILY  90 Tab  0   ??? tamsulosin (FLOMAX) 0.4 mg capsule Take 1 Cap by mouth daily.  90 Cap  3   ??? metoprolol (LOPRESSOR) 50 mg tablet Take 0.5 Tabs by mouth two (2) times a day.  90 Tab  1   ??? oxyCODONE-acetaminophen (PERCOCET) 5-325 mg per tablet Take 1 tablet by mouth every four (4) hours as needed for Pain.  15 tablet  0   ??? docusate sodium (COLACE) 100 mg capsule Take 1 capsule by mouth two (2) times a day for 90 days.  60 capsule  2   ??? polyethylene glycol (MIRALAX) 17 gram/dose powder Take 17 g by mouth daily.  17 g  0   ??? acetaminophen (TYLENOL) 500 mg tablet Take 1,000 mg by mouth every six (6) hours as needed for Pain.       ???  clobetasol (TEMOVATE) 0.05 % external solution Apply to skin irritation once daily  1 Bottle  1   ??? omega-3 fatty acids-vitamin e (FISH OIL) 1,000 mg cap Take 2 capsules by mouth daily.           Allergies:  No Known Allergies    LMP:  No LMP for male patient.    History     Social History   ??? Marital Status: MARRIED     Spouse Name: N/A     Number of Children: N/A   ??? Years of Education: N/A     Occupational History   ??? Not on file.     Social History Main Topics   ??? Smoking status: Never Smoker    ??? Smokeless tobacco: Never Used   ??? Alcohol Use: 1.5 oz/week     3 Glasses of wine per week      Comment: 3 er week   ???  Drug Use: No   ??? Sexual Activity: Not on file     Other Topics Concern   ??? Not on file     Social History Narrative         Family History   Problem Relation Age of Onset   ??? Hypertension Mother    ??? Cancer Father      ?prostate cancer, bone cancer     Other Family History:  wife died recently    Meaningful use:  done      Physical Exam:  BP 129/87    Pulse 87    Temp(Src) 97.6 ??F (36.4 ??C) (Oral)    Resp 14    Ht 5\' 11"  (1.803 m)    Wt 185 lb (83.915 kg)    BMI 25.81 kg/m2      SpO2 100%     BP Readings from Last 3 Encounters:   08/02/13 129/87   07/04/13 114/81   06/22/13 110/70         Assessment:    Patient Active Problem List   Diagnosis Code   ??? Paroxysmal atrial fibrillation 427.31   ??? BCC (basal cell carcinoma of skin) 173.91   ??? Resting tremor 781.0   ??? BPH (benign prostatic hyperplasia) 600.90   ??? HTN (hypertension) 401.9   ??? S/P colonoscopy V45.89   ??? H/O knee surgery V45.89   ??? Hernia 553.9   ??? Abnormal CT of the abdomen 793.6   ??? Lipid disorder 272.9   ??? Lumbar compression fracture 805.4   ??? 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 Glaucoma screening V80.1 REFERRAL TO OPTOMETRY   2. Need for Tdap vaccination V06.1 TETANUS, DIPHTHERIA TOXOIDS AND ACELLULAR PERTUSSIS VACCINE (TDAP), IN INDIVIDS. >=7, IM   3. BPH (benign prostatic  hyperplasia) 600.90 dutasteride (AVODART) 0.5 mg capsule    refill avodart   4. BPH (benign prostatic hyperplasia) 600.90 dutasteride (AVODART) 0.5 mg capsule   5. Grief 309.0     he is attending his first group grief counseling session tonight       Plan:  Orders Placed This Encounter   ??? TETANUS, DIPHTHERIA TOXOIDS AND ACELLULAR PERTUSSIS VACCINE (TDAP), IN INDIVIDS. >=7, IM   ??? REFERRAL TO OPTOMETRY     Referral Priority:  Routine     Referral Type:  Consultation     Referral Reason:  Specialty Services Required   ??? dutasteride (AVODART) 0.5 mg capsule     Sig: Take 1 Cap by mouth daily.     Dispense:  90 Cap     Refill:  3       See patient instructions  Patient Instructions   WELLNESS exam every September          refresh note:  done    AVS Printed:  done      The majority of today's visit was counseling or coordination of care 15 minutes.     Total face to face time:  15  Time spent counseling:  15  Subject:  See diagnoses.

## 2013-08-02 NOTE — Patient Instructions (Signed)
WELLNESS exam every September

## 2013-08-06 DIAGNOSIS — I4891 Unspecified atrial fibrillation: Secondary | ICD-10-CM | POA: Diagnosis not present

## 2013-08-06 DIAGNOSIS — Z7901 Long term (current) use of anticoagulants: Secondary | ICD-10-CM | POA: Diagnosis not present

## 2013-08-06 LAB — AMB POC PT/INR
INR POC: 1.8
Prothrombin time (POC): 22.1 s

## 2013-08-07 DIAGNOSIS — C4492 Squamous cell carcinoma of skin, unspecified: Secondary | ICD-10-CM | POA: Diagnosis not present

## 2013-08-07 DIAGNOSIS — C4491 Basal cell carcinoma of skin, unspecified: Secondary | ICD-10-CM | POA: Diagnosis not present

## 2013-08-07 DIAGNOSIS — C4442 Squamous cell carcinoma of skin of scalp and neck: Secondary | ICD-10-CM | POA: Diagnosis not present

## 2013-08-07 DIAGNOSIS — L819 Disorder of pigmentation, unspecified: Secondary | ICD-10-CM | POA: Diagnosis not present

## 2013-08-07 DIAGNOSIS — Z85828 Personal history of other malignant neoplasm of skin: Secondary | ICD-10-CM | POA: Diagnosis not present

## 2013-08-07 DIAGNOSIS — C44211 Basal cell carcinoma of skin of unspecified ear and external auricular canal: Secondary | ICD-10-CM | POA: Diagnosis not present

## 2013-08-08 MED ORDER — AVODART 0.5 MG CAPSULE
0.5 mg | ORAL_CAPSULE | ORAL | Status: DC
Start: 2013-08-08 — End: 2014-10-22

## 2013-08-10 DIAGNOSIS — M48061 Spinal stenosis, lumbar region without neurogenic claudication: Secondary | ICD-10-CM | POA: Diagnosis not present

## 2013-08-10 DIAGNOSIS — I4891 Unspecified atrial fibrillation: Secondary | ICD-10-CM | POA: Diagnosis not present

## 2013-08-10 DIAGNOSIS — IMO0002 Reserved for concepts with insufficient information to code with codable children: Secondary | ICD-10-CM | POA: Diagnosis not present

## 2013-08-10 LAB — AMB POC PT/INR
INR POC: 2.1
Prothrombin time (POC): 24.6 s

## 2013-08-20 DIAGNOSIS — M549 Dorsalgia, unspecified: Secondary | ICD-10-CM | POA: Diagnosis not present

## 2013-08-20 NOTE — Progress Notes (Signed)
[facility]  [address], [address]  [place], [place] [id number]  Phone: [phone]      Fax:  [phone]      Plan of Care/ Statement of Necessity for Physical Therapy Services    Cory Dalton  08/20/2013   Norlene DuelKim, Joseph, MD   Provider #                      Diagnosis: Back pain [724.5]  Onset Date: Jan 2015  Prior Hospitalization:       Start of Care:08/20/2013  Prior Level of Function: Independent   Comorbidities: AFIB, Scoliosis, Prior Ankle Fx    The Plan of Care and following information is based on the information from the initial evaluation.  Assessment/ key information: Pt is an 80 yo male presenting with back pain after spinal compression fracture diagnosis.  Pt presents with decreased ROM, strength, balance and flexibility, impaired gait pattern and decreased tolerance for activities making him a falls risk.  Pt will benefit from skilled physical therapy to address the above impairments.   Problem List:   pain affecting function, decrease ROM, decrease strength, edema affecting function, impaired gait/ balance, decrease ADL/ functional abilitiies, decrease activity tolerance and decrease flexibility/ joint mobility   Treatment Plan may include any combination of the following:  Therapeutic exercise, Neuromuscular re-education, Gait/balance training, Manual therapy and Patient education  Patient / Family readiness to learn indicated by: asking questions and trying to perform skills  Persons(s) to be included in education:   patient (P)  Barriers to Learning/Limitations: no    Patient Goal (s): Pt would like to resume walking on a normal schedule.    Rehabilitation Potential: good    Short Term Goals: To be accomplished in 2  weeks  1) Pt will be independent with HEP in order to maximize benefits of skilled therapy sessions.  2) Pt will demonstrate 10x sit<-->stand from standard chair without UE support in order to increase activity tolerance.  3) Pt  will demonstrate single leg stance (bilaterally) for 30 seconds without UE support and eyes open in order to decrease falls risk.  4) Pt will a/descend 12 stairs with single hand rail and reciprocal gait pattern in order to safely navigate community environment.      Long Term Goals: To be accomplished in 4  weeks   1) Pt will demonstrate single leg leg press with half body weight in order to safely navigate community environment.  2) Pt will demonstrate single leg stance (bilaterally) for 15 seconds without UE support on soft surface in order to reduce falls risk.  3) Pt will a/descend 12 stairs without UE support and reciprocal gait pattern in order to safely navigate community environment.  4) Pt will increase FOTO score to to 63% Raw score in order to reduce falls risk.     Frequency / Duration: Patient to be seen 2 times per week for 4 weeks:  Patient/ Caregiver education and instruction: self care, activity modification and exercises  G-Codes (GP)  Mobility  [id number] Current  CK= 40-59%  [id number] Goal  CJ= 20-39%      The severity rating is based on the FOTO Score score.  Certification Period: 08/20/13 - 11/20/13    Erin FullingMatthew B Dalton, SPT 08/20/2013 3:54 PM    ________________________________________________________________________    I certify that the above Therapy Services are being furnished while the patient is under my care. I agree with  the treatment plan and certify that this therapy is necessary.  Y or N I have read the above and request that my patient continue as recommended.  Y or N I have read the above report and request that my patient continue therapy with the following changes/special instructions:  Y or N I have read the above report and request that my patient be discharged from therapy    Physician's Signature:____________________  Date:________________    Please sign and return to [facility]: Fax: [phone].  THANK YOU!

## 2013-08-20 NOTE — Progress Notes (Signed)
PT INITIAL EVALUATION    Patient Name: Cory Dalton  Date:08/20/2013  DOB: 1926-12-27  [x]   Patient DOB Verified  Payor: [place] MEDICARE / Plan: [place] MEDICARE PART A & B / Product Type: Medicare /    In time:1:00 PM  Out time:2:00 PM  Total Treatment Time (min): 60  Total Timed Codes (min): 60  1:1 Treatment Time (MC only): 60   Visit #: 1     Treatment Area: Lumbar pain    SUBJECTIVE  Pain Level (0-10 scale): 2  At worst: 4  At best: 2  History of present illness:  Pt is 80 yo male presenting with back pain, with past radicular symptoms.  He received Cortizone shots to reduce pain and inflammation in his lower back 3 weeks ago.  Pt said they have had a positive impact on his pain and symptoms.     Pt describes a sharp pain on Left near L5.  No radicular symptoms during interview.  He does note some weakness in Left leg.    Bending is worse.  Cannot tie his own shoes.    Pt is relatively sedentary.  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  Sitting in a chair for extended periods is fine.  Reaching overhead bothers him to a small degree.    Pt becomes short of breath with walking.      History of Left femoral nerve damage, AFIB, scoliosis.      Aggravating factors: extension  Relieving factors: flexion    Prior level of function: Independent    Goals:  Return to walking regularly.        OBJECTIVE    Posture:  Pt demonstrates head, shoulders forward posture  Other Observations:    Gait and Functional Mobility:  Symmetric, normal gait pattern, good arm swing.  Decreased pelvic rotation.    Palpation: Pt is tender to palpation near L4/5, and along waist line  Joint Mobility: generally decreased mobility, but age appropriate.        Lumbar AROM:        R  L  Flexion    50        Extension   10        Side Bending   30  30        Rotation   50  50            LOWER QUARTER   MUSCLE STRENGTH  KEY       R  L  0 - No Contraction  L1, L2 Psoas  4+  3+    1 - Trace   L3 Quads  4+  3+     2  - Poor   L4 Tib Ant  4+  4+   3 - Fair    L5 EHL  4+  4+    4 - Good   S1 FHL  4+  4+    5 - Normal   S2 Hams  4-  4-            MMT:    Hip abduction:     4  4   Hip extension:     4  4  Neurological: Sensation: Intact  Special Tests:    Trendelenberg:  Pos left     Stork:    Forward Bend:        Slump:   [name]:  Ober:     H.S. 90/90 test: Pos bilaterally      SLR:   Neg for radicular   FABER:  Decreased flexibility, no pain   Long Sit:     SI compression/ Distraction:   Sacral thrust  Neg         Modality (rationale):   []   E-Stim: type _ x _ min     [] att   [] unatt   [] w/US   [] w/ice   [] w/heat  []   Traction: [] cerv   [] pelvic   _ lbs x _ min     [] pro   [] sup   [] int   [] const  []   Ultrasound: [] cont   [] pulse    _ W/cm2 x _  min   [] 1MHz   [] 3MHz  []   Iontophoresis: [] take home patch w/ dexamethazone    [] 40mA   [] 80mA                               [] _ mA min w/: [] dexamethazone   [] other:_  []   Ice pack _  min     []  Hot pack _  min     []  Paraffin _  min  []   Vaso Pneumatic Device 15 mins at mod compression at 34 deg      Therapeutic Exercise: [x]  see flow sheet     []  Other:_  Added/Changed Exercises:  [x]   Added: per flow sheet:  LE strengthening, single and bilateral closed chain to improve (function): strength, flexibility, balance, gait  []   Changed:_ to improve (function):  (minutes: 30 )    Therapeutic Activity:   (minutes: )    Manual Therapy:   (minutes: )    Gait Training: []  _ feet w/ _ device on level surface with _ level of assist  []   Other:_  (minutes: )    Patient Education: [x]  Review HEP    []  Progressed/Changed HEP based on:_   [x]  positioning   [x]  body mechanics   []  transfers   []  heat/ice application       Pain Level (0-10 scale) post treatment: 2    ASSESSMENT  [x]   See Plan of Care  []   See progress note/recertification  [x]   Patient will continue to benefit from skilled therapy to address remaining functional deficits: Sitting tolerance, standing tolerance, walking  tolerance, ADLs, work tasks    PLAN  [x]   Upgrade activities as tolerated     [x]   Continue plan of care  []   Discharge due to:_  []  Other:_      Erin FullingMatthew B Kucharski, SPT 08/20/2013  1600

## 2013-08-23 DIAGNOSIS — M549 Dorsalgia, unspecified: Secondary | ICD-10-CM | POA: Diagnosis not present

## 2013-08-23 NOTE — Progress Notes (Signed)
PT DAILY TREATMENT NOTE    Patient Name: Cory Dalton  Date:08/23/2013  DOB: 02-03-1927  [x]   Patient DOB Verified  Payor: VA MEDICARE / Plan: VA MEDICARE PART A & B / Product Type: Medicare /    In time: 5:00 PM  Out time: 5:45 PM  Total Treatment Time (min): 45  Total Timed Codes (min): 45  1:1 Treatment Time (MC only): 30   Visit #: 2    Treatment Area: Lumbar Stenosis     SUBJECTIVE  Pain Level (0-10 scale): 0  Any medication changes, allergies to medications, adverse drug reactions, diagnosis change, or new procedure performed?: [x]  No    []  Yes (see summary sheet for update)  Subjective functional status/changes:   [x]  No changes reported  Pt states he is a little tired today.  He has been gardening the past two days, including raking and pulling old plants.      OBJECTIVE  Modality (rationale):   []   E-Stim: type _ x _ min     [] att   [] unatt   [] w/US   [] w/ice   [] w/heat  []   Traction: [] cerv   [] pelvic   _ lbs x _ min     [] pro   [] sup   [] int   [] const  []   Ultrasound: [] cont   [] pulse    _ W/cm2 x _  min   [] 1MHz   [] 3MHz  []   Iontophoresis: [] take home patch w/ dexamethazone    [] 40mA   [] 80mA                               [] _ mA min w/: [] dexamethazone   [] other:_  []   Ice pack _  min     []  Hot pack _  min     []  Paraffin _  min  []   Other:     Therapeutic Exercise: [x]  see flow sheet     []  Other:_  Added/Changed Exercises:  [x]   Added: closed chain and single leg exercises to improve (function): strengthening and balance  []   Changed:_ to improve (function):  (minutes: 30)    Therapeutic Activity:   (minutes: )    Manual Therapy: hip and lumbar manual PROM, piriformis  (minutes: 15 )    Gait Training: []  _ feet w/ _ device on level surface with _ level of assist  []   Other:_  (minutes: )    Patient Education: []  Review HEP    []  Progressed/Changed HEP based on:_   [x]  positioning   [x]  body mechanics   []  transfers   []  heat/ice application    Other Objective/Functional Measures: Pt pain level remains  0/10.  Pt balance is improving, and cueing him to look at the horizon is helping.  Stepping strategy is slow.      Pain Level (0-10 scale) post treatment: 0    ASSESSMENT  [x]   See Plan of Care  []   See progress note/recertification  [x]   Patient will continue to benefit from skilled therapy to address remaining functional deficits:     Progress towards goals / Updated goals:  Pt tolerated treatment session well.  Pleasant personality.  Pt is making improvements with balance and strength, as evident with sit to stand and a/descending stairs.    Continue to strengthen and stabilize core, increase LE strength and overall balance and recovery strategies.      PLAN  [x]   Upgrade  activities as tolerated     [x]   Continue plan of care  []   Discharge due to:_  []  Other:_      Erin Fulling, SPT 08/23/2013  6:40 PM

## 2013-08-24 DIAGNOSIS — I4891 Unspecified atrial fibrillation: Secondary | ICD-10-CM | POA: Diagnosis not present

## 2013-08-24 LAB — AMB POC PT/INR
INR POC: 1.7
Prothrombin time (POC): 20.7 s

## 2013-08-27 DIAGNOSIS — M549 Dorsalgia, unspecified: Secondary | ICD-10-CM | POA: Diagnosis not present

## 2013-08-27 NOTE — Progress Notes (Signed)
PT DAILY TREATMENT NOTE    Patient Name: Cory Dalton  Date:08/27/2013  DOB: [birth year]  [x]   Patient DOB Verified  Payor: VA MEDICARE / Plan: VA MEDICARE PART A & B / Product Type: Medicare /    In time: 1:00 PM  Out time: 1:45 PM  Total Treatment Time (min): 45  Total Timed Codes (min): 45  1:1 Treatment Time (MC only): 30   Visit #: 3    Treatment Area: Lumbar Stenosis     SUBJECTIVE  Pain Level (0-10 scale): 0  Any medication changes, allergies to medications, adverse drug reactions, diagnosis change, or new procedure performed?: [x]  No    []  Yes (see summary sheet for update)  Subjective functional status/changes:   [x]  No changes reported  Pt states that he is feeling good today.  No pain.  Balance and strength are improving.        OBJECTIVE  Modality (rationale):   []   E-Stim: type _ x _ min     [] att   [] unatt   [] w/US   [] w/ice   [] w/heat  []   Traction: [] cerv   [] pelvic   _ lbs x _ min     [] pro   [] sup   [] int   [] const  []   Ultrasound: [] cont   [] pulse    _ W/cm2 x _  min   []   []  []   Iontophoresis: [] take home patch w/ dexamethazone    [] 40mA   [] 80mA                               [] _ mA min w/: [] dexamethazone   [] other:_  []   Ice pack _  min     []  Hot pack _  min     []  Paraffin _  min  []   Other:     Therapeutic Exercise: [x]  see flow sheet     []  Other:_  Added/Changed Exercises:  [x]   Added: closed chain and single leg exercises to improve (function): strengthening and balance  []   Changed:_ to improve (function):  (minutes: 30)    Therapeutic Activity:   (minutes: )    Manual Therapy:   (minutes:  )    Gait Training: []  _ feet w/ _ device on level surface with _ level of assist  [x]   Other:  Stepping on firm/level and soft/level, a/descending stairs, balance exercises  (minutes: 15)    Patient Education: []  Review HEP    []  Progressed/Changed HEP based on:_   [x]  positioning   [x]  body mechanics   []  transfers   []  heat/ice application    Other Objective/Functional Measures: Pt pain  level remains 0/10.  Pt balance is improving, and cueing him to look at the horizon is helping.  Overall improvement in strength with sit<-->stand.  Cueing required to control descent on stairs and step over drills.      Pain Level (0-10 scale) post treatment: 0    ASSESSMENT  [x]   See Plan of Care  []   See progress note/recertification  [x]   Patient will continue to benefit from skilled therapy to address remaining functional deficits:     Progress towards goals / Updated goals:  Pt tolerated treatment session well.  Pleasant personality.  Pt is making improvements with balance and strength, as evident with sit to stand and a/descending stairs.    Continue to strengthen and stabilize core, increase LE strength and  overall balance and recovery strategies.      PLAN  [x]   Upgrade activities as tolerated     [x]   Continue plan of care  []   Discharge due to:_  []  Other:_      Erin FullingMatthew B Kucharski, SPT 08/27/2013  1410

## 2013-08-29 DIAGNOSIS — C4492 Squamous cell carcinoma of skin, unspecified: Secondary | ICD-10-CM | POA: Diagnosis not present

## 2013-08-29 DIAGNOSIS — C4442 Squamous cell carcinoma of skin of scalp and neck: Secondary | ICD-10-CM | POA: Diagnosis not present

## 2013-08-30 DIAGNOSIS — M549 Dorsalgia, unspecified: Secondary | ICD-10-CM | POA: Diagnosis not present

## 2013-08-30 NOTE — Progress Notes (Signed)
PT DAILY TREATMENT NOTE    Patient Name: Cory Dalton  Date:08/30/2013  DOB: [birth year]  [x]   Patient DOB Verified  Payor: VA MEDICARE / Plan: VA MEDICARE PART A & B / Product Type: Medicare /    In time: 3:00 PM  Out time: 4:00 PM  Total Treatment Time (min): 60  Total Timed Codes (min): 60  1:1 Treatment Time (MC only): 30   Visit #: 4    Treatment Area: Lumbar Stenosis     SUBJECTIVE  Pain Level (0-10 scale): 0  Any medication changes, allergies to medications, adverse drug reactions, diagnosis change, or new procedure performed?: [x]  No    []  Yes (see summary sheet for update)  Subjective functional status/changes:   [x]  No changes reported  Pt states that he is feeling good today.  No pain.  Balance and strength are improving.      OBJECTIVE  Modality (rationale):   []   E-Stim: type _ x _ min     [] att   [] unatt   [] w/US   [] w/ice   [] w/heat  []   Traction: [] cerv   [] pelvic   _ lbs x _ min     [] pro   [] sup   [] int   [] const  []   Ultrasound: [] cont   [] pulse    _ W/cm2 x _  min   [] 1MHz   [] 3MHz  []   Iontophoresis: [] take home patch w/ dexamethazone    [] 40mA   [] 80mA                               [] _ mA min w/: [] dexamethazone   [] other:_  []   Ice pack _  min     []  Hot pack _  min     []  Paraffin _  min  []   Other:     Therapeutic Exercise: [x]  see flow sheet     []  Other:_  Added/Changed Exercises:  [x]   Added: closed chain and single leg exercises to improve (function): strengthening and balance  []   Changed:_ to improve (function):  (minutes: 45)    Therapeutic Activity:   (minutes: )    Manual Therapy:   (minutes:  )    Gait Training: []  _ feet w/ _ device on level surface with _ level of assist  [x]   Other:  Stepping on firm/level and soft/level, a/descending stairs, balance exercises  (minutes: 15)    Patient Education: []  Review HEP    []  Progressed/Changed HEP based on:_   [x]  positioning   [x]  body mechanics   []  transfers   []  heat/ice application    Other Objective/Functional Measures: Pt pain level  remains 0/10.  Pt balance is improving, and cueing him to look at the horizon is helping.  Overall improvement in strength with sit<-->stand. Control on stairs improved since last session.      Pain Level (0-10 scale) post treatment: 0    ASSESSMENT  [x]   See Plan of Care  []   See progress note/recertification  [x]   Patient will continue to benefit from skilled therapy to address remaining functional deficits:     Progress towards goals / Updated goals:  Pt tolerated treatment session well.  Pleasant personality.  Pt is making improvements with balance and strength, as evident with sit to stand and a/descending stairs.    Continue to strengthen and stabilize core, increase LE strength and overall balance and recovery strategies.  Pt requested to use UBE at end of session to improve UE strength and conditioning.  10 minutes    PLAN  [x]   Upgrade activities as tolerated     [x]   Continue plan of care  []   Discharge due to:_  []  Other:_      Erin Fulling, SPT 08/30/2013  1610

## 2013-08-31 DIAGNOSIS — I4891 Unspecified atrial fibrillation: Secondary | ICD-10-CM | POA: Diagnosis not present

## 2013-08-31 LAB — AMB POC PT/INR
INR POC: 2.1
Prothrombin time (POC): 24.7 s

## 2013-09-02 DIAGNOSIS — I10 Essential (primary) hypertension: Secondary | ICD-10-CM | POA: Diagnosis not present

## 2013-09-02 DIAGNOSIS — Z87891 Personal history of nicotine dependence: Secondary | ICD-10-CM | POA: Diagnosis not present

## 2013-09-02 DIAGNOSIS — Z7901 Long term (current) use of anticoagulants: Secondary | ICD-10-CM | POA: Diagnosis not present

## 2013-09-02 DIAGNOSIS — H8110 Benign paroxysmal vertigo, unspecified ear: Secondary | ICD-10-CM | POA: Diagnosis not present

## 2013-09-02 DIAGNOSIS — I4891 Unspecified atrial fibrillation: Secondary | ICD-10-CM | POA: Diagnosis not present

## 2013-09-06 DIAGNOSIS — M549 Dorsalgia, unspecified: Secondary | ICD-10-CM | POA: Diagnosis not present

## 2013-09-06 NOTE — Progress Notes (Addendum)
PT DAILY TREATMENT NOTE    Patient Name: Cory Dalton  Date:09/06/2013  DOB: 1926-11-11  [x]   Patient DOB Verified  [place]or: [place] MED[place]ARE / Plan: [place] MEDICARE [place] A & B / Product Type: Medicare /    In time: 1230  Out time: 1340  Total Treatment Time (min): 70  Total Timed Codes (min): 70  1:1 Treatment Time (MC only): 70   Visit #: 5    Treatment Area: Lumbar Stenosis     SUBJECTIVE  Pain Level (0-10 scale): 0  Any medication changes, allergies to medications, adverse drug reactions, diagnosis change, or new procedure performed?: [x]  No    []  Yes (see summary sheet for update)  Subjective functional status/changes:   [x]  No changes reported  Pt had to cancel last session due to vestibular disturbance / vertigo.  He still has some residual issues today. Went to primary and they Dx BPPV.      OBJECTIVE  Modality (rationale):   []   E-Stim: type _ x _ min     [] att   [] unatt   [] w/US   [] w/ice   [] w/heat  []   Traction: [] cerv   [] pelvic   _ lbs x _ min     [] pro   [] sup   [] int   [] const  []   Ultrasound: [] cont   [] pulse    _ W/cm2 x _  min   [] 1MHz   [] 3MHz  []   Iontophoresis: [] take home patch w/ dexamethazone    [] 40mA   [] 80mA                               [] _ mA min w/: [] dexamethazone   [] other:_  []   Ice pack _  min     []  Hot pack _  min     []  Paraffin _  min  []   Other:     Therapeutic Exercise: [x]  see flow sheet     []  Other:_  Added/Changed Exercises:  [x]   Added: closed chain and single leg exercises to improve (function): strengthening and balance  []   Changed:_ to improve (function):  (minutes: 45)    Therapeutic Activity:   (minutes: )    Neuromuscular:    Epley's maneuver for BPPV performed, right head turn starting position.  HEP provided to patient.  (minutes: 10)    Manual Therapy:   (minutes:  )    Gait Training: []  _ feet w/ _ device on level surface with _ level of assist  [x]   Other:  Stepping on firm/level and soft/level, a/descending stairs, balance exercises  (minutes: 15)    Patient Education:  []  Review HEP    []  Progressed/Changed HEP based on:_   [x]  positioning   [x]  body mechanics   []  transfers   []  heat/ice application    Other Objective/Functional Measures: Pt pain level remains 0/10.  Pt balance is improving.  Continue cueing him to look at the horizon for reference.  Overall improvement in strength with sit<-->stand.   Pt experienced mild disturbance with Dix-Hallpike test when facing Right.  Continued with Epley's maneuver.  Pt said symptoms were alleviated.      Pain Level (0-10 scale) post treatment: 0    ASSESSMENT  [x]   See Plan of Care  []   See progress note/recertification  [x]   Patient will continue to benefit from skilled therapy to address remaining functional deficits:     Progress towards goals /  Updated goals:  Pt tolerated treatment session well.  Pleasant personality.  Pt is becoming more comfortable with single leg exercises, and balance is improving.    Monitor dizziness/vertigo to determine if symptoms have resolved and/or if caused by orthostatic hypotension.        PLAN  [x]   Upgrade activities as tolerated     [x]   Continue plan of care  []   Discharge due to:_  []  Other:_      Erin FullingMatthew B Kucharski, SPT 09/06/2013  1330

## 2013-09-10 DIAGNOSIS — M549 Dorsalgia, unspecified: Secondary | ICD-10-CM | POA: Diagnosis not present

## 2013-09-10 DIAGNOSIS — I4891 Unspecified atrial fibrillation: Secondary | ICD-10-CM | POA: Diagnosis not present

## 2013-09-10 LAB — AMB POC PT/INR
INR POC: 2.3
Prothrombin time (POC): 27.1 s

## 2013-09-10 NOTE — Progress Notes (Signed)
PT DAILY TREATMENT NOTE    Patient Name: Cory Dalton  Date:09/10/2013  DOB: [birth year]  [x]   Patient DOB Verified  Payor: [place] MEDICARE / Plan: [place] MEDICARE PART A & B / Product Type: Medicare /    In time: 1:30 PM Out time: 2:30 PM  Total Treatment Time (min): 60  Total Timed Codes (min): 60  1:1 Treatment Time (MC only): 60   Visit #: 6    Treatment Area: Lumbar Stenosis     SUBJECTIVE  Pain Level (0-10 scale): 0  Any medication changes, allergies to medications, adverse drug reactions, diagnosis change, or new procedure performed?: [x]  No    []  Yes (see summary sheet for update)  Subjective functional status/changes:   [x]  No changes reported  Patient reports that his dizziness has completely resolved. He also thinks that his balance has improved.     OBJECTIVE  Modality (rationale):   []   E-Stim: type _ x _ min     [] att   [] unatt   [] w/US   [] w/ice   [] w/heat  []   Traction: [] cerv   [] pelvic   _ lbs x _ min     [] pro   [] sup   [] int   [] const  []   Ultrasound: [] cont   [] pulse    _ W/cm2 x _  min   [] 1MHz   [] 3MHz  []   Iontophoresis: [] take home patch w/ dexamethazone    [] 40mA   [] 80mA                               [] _ mA min w/: [] dexamethazone   [] other:_  []   Ice pack _  min     []  Hot pack _  min     []  Paraffin _  min  []   Other:     Therapeutic Exercise: [x]  see flow sheet     []  Other:_  Added/Changed Exercises:  [x]   Added:  to improve (function): strengthening and balance  []   Changed:_ to improve (function):  (minutes: 55)    Therapeutic Activity:   (minutes: )    Manual Therapy:   (minutes:  )    Patient Education: []  Review HEP    []  Progressed/Changed HEP based on:_   [x]  positioning   [x]  body mechanics   []  transfers   []  heat/ice application    Other Objective/Functional Measures:     Pain Level (0-10 scale) post treatment: 0    ASSESSMENT  [x]   See Plan of Care  []   See progress note/recertification  [x]   Patient will continue to benefit from skilled therapy to address remaining functional  deficits:     Progress towards goals / Updated goals:  Patient tolerated today's therapy very well with no reports of dizziness throughout session. He is progressing well towards functional goals, with improved balance reported, and will benefit from formal balance testing to gauge progress.    PLAN  [x]   Upgrade activities as tolerated     [x]   Continue plan of care  []   Discharge due to:_  []  Other:_      Orvis BrillSamuel B Deklyn Gibbon, PT, DPT 09/10/2013  1330

## 2013-09-13 DIAGNOSIS — M549 Dorsalgia, unspecified: Secondary | ICD-10-CM | POA: Diagnosis not present

## 2013-09-13 NOTE — Progress Notes (Signed)
PT DAILY TREATMENT NOTE    Patient Name: Cory Dalton  Date:09/13/2013  DOB: August 24, 1926  [x]   Patient DOB Verified  Payor: [place] MEDICARE / Plan: [place] MEDICARE PART A & B / Product Type: Medicare /    In time: 1500 Out time: 1555  Total Treatment Time (min): 55  Total Timed Codes (min): 55  1:1 Treatment Time (MC only): 30   Visit #: 7    Treatment Area: Lumbar Stenosis     SUBJECTIVE  Pain Level (0-10 scale): 0  Any medication changes, allergies to medications, adverse drug reactions, diagnosis change, or new procedure performed?: [x]  No    []  Yes (see summary sheet for update)  Subjective functional status/changes:   [x]  No changes reported  Patient reports that his dizziness has completely resolved. He also thinks that his balance has improved.   Overall, feeling stronger.    OBJECTIVE  Modality (rationale):   []   E-Stim: type _ x _ min     [] att   [] unatt   [] w/US   [] w/ice   [] w/heat  []   Traction: [] cerv   [] pelvic   _ lbs x _ min     [] pro   [] sup   [] int   [] const  []   Ultrasound: [] cont   [] pulse    _ W/cm2 x _  min   [] 1MHz   [] 3MHz  []   Iontophoresis: [] take home patch w/ dexamethazone    [] 40mA   [] 80mA                               [] _ mA min w/: [] dexamethazone   [] other:_  []   Ice pack _  min     []  Hot pack _  min     []  Paraffin _  min  []   Other:     Therapeutic Exercise: [x]  see flow sheet     []  Other:_  Added/Changed Exercises:  [x]   Added:  to improve (function): strengthening and balance  []   Changed:_ to improve (function):  (minutes: 45)    Therapeutic Activity:   (minutes: )    Neuromuscular Therapy:    Balance, single leg activities, weight shifts to improve safety.  (minutes: 10)    Manual Therapy:   (minutes:  )    Patient Education: []  Review HEP    []  Progressed/Changed HEP based on:_   [x]  positioning   [x]  body mechanics   []  transfers   []  heat/ice application    Other Objective/Functional Measures:     Pain Level (0-10 scale) post treatment: 0    ASSESSMENT  [x]   See Plan of Care  []    See progress note/recertification  [x]   Patient will continue to benefit from skilled therapy to address remaining functional deficits:     Progress towards goals / Updated goals:  Patient tolerated today's therapy very well with no reports of dizziness throughout session. He is progressing well towards functional goals, with improved balance reported, and will benefit from formal balance testing to gauge progress.    PLAN  [x]   Upgrade activities as tolerated     [x]   Continue plan of care  []   Discharge due to:_  []  Other:_      Erin FullingMatthew B Kucharski, SPT 09/13/2013  1630

## 2013-09-17 DIAGNOSIS — M549 Dorsalgia, unspecified: Secondary | ICD-10-CM | POA: Diagnosis not present

## 2013-09-17 NOTE — Progress Notes (Signed)
PT DAILY TREATMENT NOTE    Patient Name: Seleta RhymesJean O Spearman  Date:09/17/2013  DOB: 10/21/1926  [x]   Patient DOB Verified  Payor: [place] MEDICARE / Plan: [place] MEDICARE PART A & B / Product Type: Medicare /    In time: 1255 Out time: 1350  Total Treatment Time (min): 55  Total Timed Codes (min): 55  1:1 Treatment Time (MC only): 55   Visit #: 8    Treatment Area: Lumbar Stenosis     SUBJECTIVE  Pain Level (0-10 scale): 0  Any medication changes, allergies to medications, adverse drug reactions, diagnosis change, or new procedure performed?: [x]  No    []  Yes (see summary sheet for update)  Subjective functional status/changes:   [x]  No changes reported  Pt states that for the first time in recent memory he has walked the 1/2 mile loop in his neighborhood.  He did this on 2 consecutive days.      OBJECTIVE  Modality (rationale):   []   E-Stim: type _ x _ min     [] att   [] unatt   [] w/US   [] w/ice   [] w/heat  []   Traction: [] cerv   [] pelvic   _ lbs x _ min     [] pro   [] sup   [] int   [] const  []   Ultrasound: [] cont   [] pulse    _ W/cm2 x _  min   [] 1MHz   [] 3MHz  []   Iontophoresis: [] take home patch w/ dexamethazone    [] 40mA   [] 80mA                               [] _ mA min w/: [] dexamethazone   [] other:_  []   Ice pack _  min     []  Hot pack _  min     []  Paraffin _  min  []   Other:     Therapeutic Exercise: [x]  see flow sheet     []  Other:_  Added/Changed Exercises:  [x]   Added:  to improve (function): strengthening and balance  []   Changed:_ to improve (function):  (minutes: 45)    Therapeutic Activity:   (minutes: )    Neuromuscular Therapy:    Balance, single leg activities, weight shifts to improve safety.  (minutes: 10)    Manual Therapy:   (minutes:  )    Patient Education: []  Review HEP    [x]  Progressed/Changed HEP based on:  Advance HEP per pt request.   [x]  positioning   [x]  body mechanics   []  transfers   []  heat/ice application    Other Objective/Functional Measures:     Pain Level (0-10 scale) post treatment: 0     ASSESSMENT  [x]   See Plan of Care  []   See progress note/recertification  [x]   Patient will continue to benefit from skilled therapy to address remaining functional deficits:     Progress towards goals / Updated goals:  Patient tolerated today's therapy very well with no reports of pain or discomfort.  Pt continues to improve balance and strength.  HEP updated at pt's request.  Continue to challenge with functional movements.      PLAN  [x]   Upgrade activities as tolerated     [x]   Continue plan of care  []   Discharge due to:_  []  Other:_      Erin FullingMatthew B Kucharski, SPT 09/17/2013  1400

## 2013-09-20 DIAGNOSIS — M549 Dorsalgia, unspecified: Secondary | ICD-10-CM | POA: Diagnosis not present

## 2013-09-20 NOTE — Progress Notes (Signed)
PT DAILY TREATMENT NOTE    Patient Name: Cory Dalton  Date:09/20/2013  DOB: 09-03-1926  '[x]'   Patient DOB Verified  Payor: [place] MEDICARE / Plan: [place] MEDICARE PART A & B / Product Type: Medicare /    In time: 1735 Out time: 1335  Total Treatment Time (min): 55  Total Timed Codes (min): 55  1:1 Treatment Time (MC only): 55   Visit #: 9    Treatment Area: Lumbar Stenosis     SUBJECTIVE  Pain Level (0-10 scale): 0  Any medication changes, allergies to medications, adverse drug reactions, diagnosis change, or new procedure performed?: '[x]'  No    '[]'  Yes (see summary sheet for update)  Subjective functional status/changes:   '[x]'  No changes reported  Pt is moving comfortably throughout the day and says his balance is much improved.    OBJECTIVE  Modality (rationale):   '[]'   E-Stim: type _ x _ min     '[]' att   '[]' unatt   '[]' w/US   '[]' w/ice   '[]' w/heat  '[]'   Traction: '[]' cerv   '[]' pelvic   _ lbs x _ min     '[]' pro   '[]' sup   '[]' int   '[]' const  '[]'   Ultrasound: '[]' cont   '[]' pulse    _ W/cm2 x _  min   '[]' 1MHz   '[]' 3MHz  '[]'   Iontophoresis: '[]' take home patch w/ dexamethazone    '[]' 7m   '[]' 858m                              '[]' _ mA min w/: '[]' dexamethazone   '[]' other:_  '[]'   Ice pack _  min     '[]'  Hot pack _  min     '[]'  Paraffin _  min  '[]'   Other:     Therapeutic Exercise: '[x]'  see flow sheet     '[]'  Other:_  Added/Changed Exercises:  '[x]'   Added:  to improve (function): strengthening and balance  '[]'   Changed:_ to improve (function):  (minutes: 45)    Therapeutic Activity:   (minutes: )    Neuromuscular Therapy:    Balance, single leg activities, weight shifts to improve safety.  (minutes: 10)    Manual Therapy:   (minutes:  )    Patient Education: '[]'  Review HEP    '[x]'  Progressed/Changed HEP based on:  Advance HEP per pt request.   '[x]'  positioning   '[x]'  body mechanics   '[]'  transfers   '[]'  heat/ice application    Other Objective/Functional Measures:     Pain Level (0-10 scale) post treatment: 0    ASSESSMENT  '[x]'   See Plan of Care  '[]'   See progress  note/recertification  '[x]'   Patient will continue to benefit from skilled therapy to address remaining functional deficits:     Progress towards goals / Updated goals:  Patient tolerated today's therapy very well with no reports of pain or discomfort.  Pt has met, or partially met all functional goals and will be discharged at this time with HEP.  Discussed with patient options for mall walking groups and staying active.  Pt acknowledges.    Short Term Goals: To be accomplished in 2 weeks  1) Pt will be independent with HEP in order to maximize benefits of skilled therapy sessions. Met  2) Pt will demonstrate 10x sit<-->stand from standard chair without UE support in order to increase activity tolerance.  Met  3) Pt will demonstrate single  leg stance (bilaterally) for 30 seconds without UE support and eyes open in order to decrease falls risk. Partially met >15 sec  4) Pt will a/descend 12 stairs with single hand rail and reciprocal gait pattern in order to safely navigate community environment. Met    Long Term Goals: To be accomplished in 4 weeks   1) Pt will demonstrate single leg leg press with half body weight in order to safely navigate community environment. Partially met.  Significant improvement.  2) Pt will demonstrate single leg stance (bilaterally) for 15 seconds without UE support on soft surface in order to reduce falls risk.  Partially met >10 sec  3) Pt will a/descend 12 stairs without UE support and reciprocal gait pattern in order to safely navigate community environment. Met  4) Pt will increase FOTO score to to 63% Raw score in order to reduce falls risk.    PLAN  '[]'   Upgrade activities as tolerated     '[]'   Continue plan of care  '[x]'   Discharge due to: Goals met  '[]'  Other:_      Okey Regal, SPT 09/20/2013  1400

## 2013-10-01 DIAGNOSIS — I4891 Unspecified atrial fibrillation: Secondary | ICD-10-CM | POA: Diagnosis not present

## 2013-10-01 DIAGNOSIS — Z7901 Long term (current) use of anticoagulants: Secondary | ICD-10-CM | POA: Diagnosis not present

## 2013-10-01 LAB — AMB POC PT/INR
INR POC: 1.7
Prothrombin time (POC): 20 s

## 2013-10-04 DIAGNOSIS — Z1339 Encounter for screening examination for other mental health and behavioral disorders: Secondary | ICD-10-CM | POA: Diagnosis not present

## 2013-10-04 DIAGNOSIS — Z0000 Encounter for general adult medical examination without abnormal findings: Secondary | ICD-10-CM | POA: Diagnosis not present

## 2013-10-04 DIAGNOSIS — I10 Essential (primary) hypertension: Secondary | ICD-10-CM | POA: Diagnosis not present

## 2013-10-04 DIAGNOSIS — Z1331 Encounter for screening for depression: Secondary | ICD-10-CM | POA: Diagnosis not present

## 2013-10-04 NOTE — Progress Notes (Signed)
[facility]  [address]  [place], [place] [id number]  [phone]             Date of visit: 10/04/2013       This is an Initial Medicare Annual Wellness Visit (AWV), (Performed more than 12 months after effective date of Medicare Part B enrollment and 12 months after last preventive visit, Once in a lifetime)    I have reviewed the patient's medical history in detail and updated the computerized patient record.     History obtained from: the patient.    Concerns today   (Patient understands that medical problems addressed today may incur additional cost as this is a preventive visit)    History     Patient Active Problem List   Diagnosis Code   ??? Paroxysmal atrial fibrillation (HCC) 427.31   ??? BCC (basal cell carcinoma of skin) 173.91   ??? Resting tremor 781.0   ??? BPH (benign prostatic hyperplasia) 600.00   ??? HTN (hypertension) 401.9   ??? S/P colonoscopy V45.89   ??? H/O knee surgery V45.89   ??? Hernia 553.9   ??? Abnormal CT of the abdomen 793.6   ??? Lipid disorder 272.9   ??? Lumbar compression fracture (HCC) 805.4   ??? 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 Paroxysmal atrial fibrillation (HCC) 07/04/2007   ??? BPH 07/04/2007   ??? Resting tremor 07/04/2007   ??? BCC (basal cell carcinoma of skin) 07/04/2007   ??? Atrial fibrillation (HCC)    ??? Skin cancer    ??? Hypertension       Past Surgical History   Procedure Laterality Date   ??? Hx hernia repair  04/2010   ??? Hx cataract removal       No Known Allergies  Current Outpatient Prescriptions   Medication Sig Dispense Refill   ??? AVODART 0.5 mg capsule TAKE 1 CAPSULE DAILY  90 Cap  3   ??? lovastatin (MEVACOR) 20 mg tablet Take 1 Tab by mouth nightly.  90 Tab  1   ??? amLODIPine (NORVASC) 5 mg tablet TAKE 1 TABLET BY MOUTH DAILY  90 Tab  1   ??? triamterene-hydrochlorothiazide (MAXZIDE) 37.5-25 mg per tablet TAKE 1 TABLET BY MOUTH EVERY DAY  90 Tab  3   ??? digoxin (DIGOX) 0.125 mg tablet TAKE 1 TABLET  BY MOUTH DAILY  90 Tab  0   ??? tamsulosin (FLOMAX) 0.4 mg capsule Take 1 Cap by mouth daily.  90 Cap  3   ??? metoprolol (LOPRESSOR) 50 mg tablet Take 0.5 Tabs by mouth two (2) times a day.  90 Tab  1   ??? polyethylene glycol (MIRALAX) 17 gram/dose powder Take 17 g by mouth daily.  17 g  0   ??? acetaminophen (TYLENOL) 500 mg tablet Take 1,000 mg by mouth every six (6) hours as needed for Pain.       ??? clobetasol (TEMOVATE) 0.05 % external solution Apply to skin irritation once daily  1 Bottle  1   ??? omega-3 fatty acids-vitamin e (FISH OIL) 1,000 mg cap Take 2 capsules by mouth daily.       ??? celecoxib (CELEBREX) 200 mg capsule        ??? meclizine (ANTIVERT) 25 mg tablet     0   ??? metoprolol (LOPRESSOR) 25 mg tablet     0     Family  History   Problem Relation Age of Onset   ??? Hypertension Mother    ??? Cancer Father      ?prostate cancer, bone cancer     History   Substance Use Topics   ??? Smoking status: Never Smoker    ??? Smokeless tobacco: Never Used   ??? Alcohol Use: 1.5 oz/week     3 Glasses of wine per week      Comment: 3 er week       Specialists/Care Team   Seleta Rhymes has established care with the following healthcare providers:  HTN    Health Risk Assessment     Demographics   male  80 y.o.  WHITE OR CAUCASIAN    General Health Questions   -During the past 4 weeks:   -how would you rate your health in general? Fair   -how often have you been bothered by feeling dizzy when standing up? never   -how much have you been bothered by bodily pain? mildly   -Have you noticed any hearing difficulties? Yes> has been evaluated and has hearing AIDs   -has your physical and emotional health limited your social activities with family or friends? no    Emotional Health Questions   -Do you have a history of depression, anxiety, or emotional problems? no  -Over the past 2 weeks, have you felt down, depressed or hopeless? no  -Over the past 2 weeks, have you felt little interest or pleasure in doing things? no    Health Habits    Please describe your diet habits: Get meals from [place]  Do you get 5 servings of fruits or vegetables daily? yes and not often  Do you exercise regularly? yes and walks and goes to physical therapy    Activities of Daily Living and Functional Status   -Do you need help with eating, walking, dressing, bathing, toileting, the phone, transportation, shopping, preparing meals, housework, laundry, medications or managing money? no  -In the past four weeks, was someone available to help you if you needed and wanted help with anything? no  -Are you confident are you that you can control and manage most of your health problems? yes  -Have you been given information to help you keep track of your medications? yes  -How often do you have trouble taking your medications as prescribed? never    Fall Risk and Home Safety   Have you fallen 2 or more times in the past year? no  Does your home have rugs in the hallway, lack grab bars in the bathroom, lack handrails on the stairs or have poor lighting? no  Do you have smoke detectors and check them regularly? no  Do you have difficulties driving a car? no  Do you always fasten your seat belt when you are in a car? yes    Review of Systems (if indicated for problems addressed today)       Physical Examination     Filed Vitals:    10/04/13 1349   BP: 141/77   Pulse: 68   Temp: 96.4 ??F (35.8 ??C)   TempSrc: Oral   Resp: 18   Height: 5\' 11"  (1.803 m)   Weight: 186 lb (84.369 kg)   SpO2: 98%     Body mass index is 25.95 kg/(m^2).   No exam data present  Was the patient's timed Up & Go test unsteady or longer than 30 seconds? no    Evaluation of Cognitive Function  Mood/affect:  neutral  Orientation: Person, Place, Time and Situation  Appearance: age appropriate and well dressed  Family member/caregiver input: no    Additional exam if indicated for problems addressed today:    Advice/Referrals/Counseling (as indicated)   Education and counseling provided for any problems identified  above: yes    Preventive Services     (Preventive care checklist to be included in patient instructions)      Discussion of Advance Directive   Discussed with Seleta RhymesJean O. Stanczak his ability to prepare and advance directive in the case that an injury or illness causes him to be unable to make health care decisions.     He already has an advance directive. Patient will bring it over to put on file    Assessment/Plan   V70.0    ICD-9-CM   1. Routine general medical examination at a health care facility V70.0   2. Screening for depression V79.0   3. Screening for alcoholism V79.1       Orders Placed This Encounter   ??? celecoxib (CELEBREX) 200 mg capsule   ??? meclizine (ANTIVERT) 25 mg tablet   ??? metoprolol (LOPRESSOR) 25 mg tablet       Follow-up Disposition:  Return in about 1 year (around 10/05/2014).    Suan HalterElizabeth Damarys Speir, MD

## 2013-10-04 NOTE — Progress Notes (Signed)
Chief Complaint   Patient presents with   ??? Annual Wellness Visit     Medicare     1. Have you been to the ER, urgent care clinic since your last visit?  Hospitalized since your last visit?No    2. Have you seen or consulted any other health care providers outside of the [facility] since your last visit?  Include any pap smears or colon screening. No

## 2013-10-04 NOTE — Patient Instructions (Addendum)
Learning About Diabetes and Heart Disease  How are diabetes and heart disease connected?  Many people think diabetes and heart disease go hand in hand. But having diabetes doesn't have to mean that you are going to have a heart attack someday. Healthy living can help prevent many of the problems that come with both diabetes and heart disease.  For some people, diabetes can cause problems in your body that may lead to heart disease. Diabetes can make the problems of heart disease worse.  But here's the good news: The good things you're doing to stay healthy with diabetes???eating healthy foods, quitting smoking, getting exercise and more???are also helping your heart.  How can diabetes lead to heart disease?  The same things that make diabetes a serious condition can also lead to heart disease or make it worse.  ?? High cholesterol causes the buildup of a kind of fat inside the blood vessel walls, making them too narrow. This reduces the flow of blood and can cause a heart attack.  ?? High blood pressure pushes blood through the arteries with too much force. Over time, this damages the walls of the arteries.  ?? High blood sugar can damage the lining of blood vessels. This can lead to the hardening and narrowing of the arteries, resulting in less blood flow to the heart.  Diabetes also increases your risk for kidney damage. If you have signs of kidney damage, you may also have a higher risk for heart disease. Kidney damage shares many of the risk factors for heart disease (such as high cholesterol, high blood pressure, and high blood sugar).  How can you manage heart disease with your diabetes?  Managing your diabetes and keeping your heart healthy are two sides of the same coin. Here are some things you can do.  ?? Test your blood sugar levels and get your diabetes tests on schedule. Try to keep your numbers within your target range.  ?? Keep track of your blood pressure. The target for most people with diabetes is below  140/80. Your doctor will give you a goal that's right for you. If your blood pressure is high, your treatment may also include medicine. Changes in your lifestyle, such as staying at a healthy weight, may also help you lower your blood pressure.  ?? If you have high cholesterol, try controlling it by eating more fruits and vegetables and less animal fat and trans fats, like those found in packaged snack foods. Getting regular exercise can really help. If changes in diet and exercise don't improve your cholesterol levels, talk to your doctor about using medicine.  ?? Ask your doctor if you should take low-dose aspirin. A low-dose aspirin every day can prevent blood clots from forming in your arteries. A blood clot in an artery in your heart can cause a heart attack.  ?? If your doctor recommends it, get more exercise. Walking is a good choice. Bit by bit, increase the amount you walk every day. Try for at least 30 minutes on most days of the week.  ?? Do not smoke. Smoking can make diabetes and heart disease worse. If you need help quitting, talk to your doctor about stop-smoking programs and medicines. These can increase your chances of quitting for good.   Where can you learn more?   Go to http://www.healthwise.net/[facility]  Enter K649 in the search box to learn more about "Learning About Diabetes and Heart Disease."   ?? 2006-2015 Healthwise, Incorporated. Care instructions adapted under license   by [employer] (which disclaims liability or warranty for this information). This care instruction is for use with your licensed healthcare professional. If you have questions about a medical condition or this instruction, always ask your healthcare professional. [place] any warranty or liability for your use of this information.  Content Version: 10.4.390249; Current as of: April 06, 2013              DASH Diet: After Your Visit  Your Care Instructions  The DASH diet is an eating plan that can  help lower your blood pressure. DASH stands for Dietary Approaches to Stop Hypertension. Hypertension is high blood pressure.  The DASH diet focuses on eating foods that are high in calcium, potassium, and magnesium. These nutrients can lower blood pressure. The foods that are highest in these nutrients are fruits, vegetables, low-fat dairy products, nuts, seeds, and legumes. But taking calcium, potassium, and magnesium supplements instead of eating foods that are high in those nutrients does not have the same effect. The DASH diet also includes whole grains, fish, and poultry.  The DASH diet is one of several lifestyle changes your doctor may recommend to lower your high blood pressure. Your doctor may also want you to decrease the amount of sodium in your diet. Lowering sodium while following the DASH diet can lower blood pressure even further than just the DASH diet alone.  Follow-up care is a key part of your treatment and safety. Be sure to make and go to all appointments, and call your doctor if you are having problems. It's also a good idea to know your test results and keep a list of the medicines you take.  How can you care for yourself at home?  Following the DASH diet  ?? Eat 4 to 5 servings of fruit each day. A serving is 1 medium-sized piece of fruit, ?? cup chopped or canned fruit, 1/4 cup dried fruit, or 4 ounces (?? cup) of fruit juice. Choose fruit more often than fruit juice.  ?? Eat 4 to 5 servings of vegetables each day. A serving is 1 cup of lettuce or raw leafy vegetables, ?? cup of chopped or cooked vegetables, or 4 ounces (?? cup) of vegetable juice. Choose vegetables more often than vegetable juice.  ?? Get 2 to 3 servings of low-fat and fat-free dairy each day. A serving is 8 ounces of milk, 1 cup of yogurt, or 1 ?? ounces of cheese.  ?? Eat 6 to 8 servings of grains each day. A serving is 1 slice of bread, 1 ounce of dry cereal, or ?? cup of cooked rice, pasta, or cooked cereal. Try to choose  whole-grain products as much as possible.  ?? Limit lean meat, poultry, and fish to 2 servings each day. A serving is 3 ounces, about the size of a deck of cards.  ?? Eat 4 to 5 servings of nuts, seeds, and legumes (cooked dried beans, lentils, and split peas) each week. A serving is 1/3 cup of nuts, 2 tablespoons of seeds, or ?? cup cooked dried beans or peas.  ?? Limit fats and oils to 2 to 3 servings each day. A serving is 1 teaspoon of vegetable oil or 2 tablespoons of salad dressing.  ?? Limit sweets and added sugars to 5 servings or less a week. A serving is 1 tablespoon jelly or jam, ?? cup sorbet, or 1 cup of lemonade.  ?? Eat less than 2,300 milligrams (mg) of sodium a day. If  you have high blood pressure, diabetes, or chronic kidney disease, if you are African-American, or if you are older than age 78, try to limit the amount of sodium you eat to less than 1,500 mg a day.  Tips for success  ?? Start small. Do not try to make dramatic changes to your diet all at once. You might feel that you are missing out on your favorite foods and then be more likely to not follow the plan. Make small changes, and stick with them. Once those changes become habit, add a few more changes.  ?? Try some of the following:  ?? Make it a goal to eat a fruit or vegetable at every meal and at snacks. This will make it easy to get the recommended amount of fruits and vegetables each day.  ?? Try yogurt topped with fruit and nuts for a snack or healthy dessert.  ?? Add lettuce, tomato, cucumber, and onion to sandwiches.  ?? Combine a ready-made pizza crust with low-fat mozzarella cheese and lots of vegetable toppings. Try using tomatoes, squash, spinach, broccoli, carrots, cauliflower, and onions.  ?? Have a variety of cut-up vegetables with a low-fat dip as an appetizer instead of chips and dip.  ?? Sprinkle sunflower seeds or chopped almonds over salads. Or try adding chopped walnuts or almonds to cooked vegetables.  ?? Try some vegetarian meals  using beans and peas. Add garbanzo or kidney beans to salads. Make burritos and tacos with mashed pinto beans or black beans.   Where can you learn more?   Go to MetropolitanBlog.huhttp://www.healthwise.net/[facility]  Enter H967 in the search box to learn more about "DASH Diet: After Your Visit."   ?? 2006-2015 Healthwise, Incorporated. Care instructions adapted under license by [employer] (which disclaims liability or warranty for this information). This care instruction is for use with your licensed healthcare professional. If you have questions about a medical condition or this instruction, always ask your healthcare professional. Healthwise, Incorporated disclaims any warranty or liability for your use of this information.  Content Version: 10.4.390249; Current as of: August 02, 2012        Discussed today Done Previously    x  Pneumococcal vaccines   x  Flu vaccine     Hepatitis B vaccine (if at risk)   x  Shingles vaccine     TDAP vaccine     Pap smear   x  Colorectal cancer screening     Low-dose CT for lung cancer screening     Bone density test   x  Glaucoma screening   x  Cholesterol test   x  Diabetes screening test      AAA ultrasound (if family history)     Diabetes self-management class     Nutritionist referral for diabetes or renal disease

## 2013-10-11 NOTE — Progress Notes (Signed)
[facility]   [address] [place], [place]  [place], [place]  Phone: [phone] Fax: [phone]      Discharge Summary 2-15      Patient name: Cory Dalton  DOB: Jul 24, 1926  Provider#: [id number]  Referral source: Ludwig Lean, MD      Medical/Treatment Diagnosis: Back pain [724.5]     Prior Hospitalization: see medical history     Comorbidities: AFIB, Scoliosis, Prior Ankle Fx  Prior Level of Function:Independent  Medications: Verified on Patient Summary List    Start of Care: 08/20/13      Onset Date:January 2015   Visits from Start of Care: 9     Missed Visits: 0  Reporting Period : 08/20/13 to 10/11/13    Short Term Goals: To be accomplished in 2 weeks  1) Pt will be independent with HEP in order to maximize benefits of skilled therapy sessions. Met.  2) Pt will demonstrate 10x sit<-->stand from standard chair without UE support in order to increase activity tolerance.Met.  3) Pt will demonstrate single leg stance (bilaterally) for 30 seconds without UE support and eyes open in order to decrease falls risk.Met.  4) Pt will a/descend 12 stairs with single hand rail and reciprocal gait pattern in order to safely navigate community environment.Met.     Long Term Goals: To be accomplished in 4 weeks   1) Pt will demonstrate single leg leg press with half body weight in order to safely navigate community environment.Met.  2) Pt will demonstrate single leg stance (bilaterally) for 15 seconds without UE support on soft surface in order to reduce falls risk.Met.  3) Pt will a/descend 12 stairs without UE support and reciprocal gait pattern in order to safely navigate community environment.Met.  4) Pt will increase FOTO score to to 63% Raw score in order to reduce falls risk.Met.    Assessment/Summary of care: Patient progressed very well with a complete resolution in low back pain and an overall improvement in function. He achieved all functional goals and no longer requires skilled  PT.    G-Codes (GP)  Mobility   G8979 Goal  CK= 40-59% G8980 D/C  CJ= 20-39%    The severity rating is based on clinical judgment and the FOTO Score score.    RECOMMENDATIONS:  '[x]' Discontinue therapy: '[x]' Patient has reached or is progressing toward set goals     '[]' Patient is non-compliant or has abdicated     '[]' Due to lack of appreciable progress towards set goals    Sharol Roussel, PT, DPT   10/11/2013 12:14 PM

## 2013-11-01 DIAGNOSIS — I4891 Unspecified atrial fibrillation: Secondary | ICD-10-CM | POA: Diagnosis not present

## 2013-11-01 DIAGNOSIS — Z7901 Long term (current) use of anticoagulants: Secondary | ICD-10-CM | POA: Diagnosis not present

## 2013-11-01 LAB — AMB POC PT/INR
INR POC: 2.1
Prothrombin time (POC): 24.9 s

## 2013-11-10 MED ORDER — DIGOX 125 MCG (0.125 MG) TABLET
125 mcg (0. mg) | ORAL_TABLET | ORAL | Status: DC
Start: 2013-11-10 — End: 2014-02-24

## 2013-11-11 NOTE — Telephone Encounter (Signed)
Not currently under my care.  Chart review reveals this was prescribed last month.

## 2013-11-15 NOTE — Telephone Encounter (Signed)
Called and talked to patient who has already received a 90 days supply a few days ago

## 2013-11-21 DIAGNOSIS — D4959 Neoplasm of unspecified behavior of other genitourinary organ: Secondary | ICD-10-CM | POA: Diagnosis not present

## 2013-11-21 DIAGNOSIS — N139 Obstructive and reflux uropathy, unspecified: Secondary | ICD-10-CM | POA: Diagnosis not present

## 2013-11-21 DIAGNOSIS — R350 Frequency of micturition: Secondary | ICD-10-CM | POA: Diagnosis not present

## 2013-11-29 DIAGNOSIS — Z7901 Long term (current) use of anticoagulants: Secondary | ICD-10-CM | POA: Diagnosis not present

## 2013-11-29 LAB — AMB POC PT/INR
INR POC: 2.4
Prothrombin time (POC): 28.6 s

## 2013-12-19 ENCOUNTER — Encounter

## 2013-12-19 MED ORDER — WARFARIN 2.5 MG TAB
2.5 mg | ORAL_TABLET | Freq: Every day | ORAL | Status: DC
Start: 2013-12-19 — End: 2014-12-24

## 2013-12-19 NOTE — Telephone Encounter (Signed)
From: Cory RhymesJean O Dalton  To: Cory BoroughsPaul Jackson V, MD  Sent: 12/18/2013 1:26 PM EDT  Subject:  Prescription Question    Please  refill my Warfarin 2.5MG  @ [facility] on [address], [phone] for 90 days, one year.

## 2013-12-21 DIAGNOSIS — I10 Essential (primary) hypertension: Secondary | ICD-10-CM | POA: Diagnosis not present

## 2013-12-21 DIAGNOSIS — E789 Disorder of lipoprotein metabolism, unspecified: Secondary | ICD-10-CM | POA: Diagnosis not present

## 2013-12-21 DIAGNOSIS — I4891 Unspecified atrial fibrillation: Secondary | ICD-10-CM | POA: Diagnosis not present

## 2013-12-21 NOTE — Progress Notes (Signed)
CARDIOLOGY OFFICE NOTE    Laney Louderback A. Tabor Bartram, MD, [facility]    [address], [address], [place], [place] [address]  Phone [phone]; Fax [phone]  Mobile [phone]   Voice Mail [phone]    No admission date for patient encounter.  Jennye Boroughs, MD (General)    DOB:  1926-06-03   MRN:  [id number]     Subjective             Cory Dalton is a 80 y.o. male I am seeing for hypertension, hyperlipidemia and Atrial Fibrillation. Symptoms include: chest pain  Cardiac risk factors: male gender, hypertension, dyslipidemia.I have personally obtained the history from the patient.I have no sen Mr. Bagot since 2009. He apparently moved to [place] and [place] now back. He will note at night his breathing is very short. It does not keep him awake though.he has no increase swelling in legs or belly. If he has to go up an incline .    No Known Allergies      Past Medical History   Diagnosis Date   ??? Paroxysmal atrial fibrillation (HCC) 07/04/2007   ??? BPH 07/04/2007   ??? Resting tremor 07/04/2007   ??? BCC (basal cell carcinoma of skin) 07/04/2007   ??? Atrial fibrillation (HCC)    ??? Skin cancer    ??? Hypertension         Past Surgical History   Procedure Laterality Date   ??? Hx hernia repair  04/2010   ??? Hx cataract removal         Home Medications:  Current Outpatient Prescriptions   Medication Sig   ??? warfarin (COUMADIN) 2.5 mg tablet Take 1 Tab by mouth daily.   ??? DIGOX 125 mcg tablet TAKE 1 TABLET BY MOUTH EVERY DAY   ??? AVODART 0.5 mg capsule TAKE 1 CAPSULE DAILY   ??? lovastatin (MEVACOR) 20 mg tablet Take 1 Tab by mouth nightly.   ??? amLODIPine (NORVASC) 5 mg tablet TAKE 1 TABLET BY MOUTH DAILY   ??? triamterene-hydrochlorothiazide (MAXZIDE) 37.5-25 mg per tablet TAKE 1 TABLET BY MOUTH EVERY DAY   ??? tamsulosin (FLOMAX) 0.4 mg capsule Take 1 Cap by mouth daily.   ??? metoprolol (LOPRESSOR) 50 mg tablet Take 0.5 Tabs by mouth two (2) times a day.   ??? polyethylene glycol (MIRALAX) 17 gram/dose powder Take 17 g by mouth  daily.   ??? acetaminophen (TYLENOL) 500 mg tablet Take 1,000 mg by mouth every six (6) hours as needed for Pain.     No current facility-administered medications for this visit.             Objective:   Laboratory and Imaging have been reviewed by me and are notable for        Lab Results   Component Value Date/Time    INR 4.0 06/14/2013 04:26 AM    INR 2.5 06/13/2013 02:40 AM    INR 2.5 06/12/2013 07:42 PM    INR POC 2.4 11/29/2013 02:20 PM    INR POC 2.1 11/01/2013 02:30 PM    INR POC 1.7 10/01/2013 03:17 PM    PROTHROMBIN TIME 42.8 06/14/2013 04:26 AM    PROTHROMBIN TIME 25.8 06/13/2013 02:40 AM    PROTHROMBIN TIME 26.0 06/12/2013 07:42 PM               Cardiac work up to date:      PROCEDURES  DATE       1.  Echocardiogram                 (04/13/07)  EF 55-60%, BAE, Mild TR with    04/2012 echocardiogram:  SUMMARY:  Left ventricle: Systolic function was normal. Ejection fraction was  estimated to be 65 %. There were no regional wall motion abnormalities.    Right ventricle: The ventricle was mildly to moderately dilated. Systolic  function was mildly reduced.    Left atrium: The atrium was markedly dilated.    Atrial septum: The septum bows from right to left, consistent with  increased right atrial pressure.    Right atrium: The atrium was moderately to markedly dilated.    Mitral valve: There was mild to moderate regurgitation.    Tricuspid valve: There was severe regurgitation. There was moderate to  severe pulmonary hypertension.    Inferior vena cava, hepatic veins: The inferior vena cava was mildly  dilated. Respirophasic changes were blunted (less than 50% variation).    06/22/13  Left ventricle: Systolic function was normal. Ejection fraction was  estimated to be 60 %. There were no regional wall motion abnormalities.    Right ventricle: The ventricle was mildly to moderately dilated. Systolic  function was mildly reduced.     Left atrium: The atrium was moderately dilated.    Right atrium: The atrium was moderately dilated.    Mitral valve: There was mild regurgitation.    Aortic valve: Normal valve structure. The valve was probably trileaflet.  Leaflets exhibited sclerosis without stenosis.    Tricuspid valve: There was moderate to severe regurgitation.        2. Cholesterol profile                         Lab Results   Component Value Date/Time    CHOLESTEROL, TOTAL 146 05/01/2013 12:05 PM    HDL CHOLESTEROL 80 05/01/2013 12:05 PM    LDL, CALCULATED 58 05/01/2013 12:05 PM    VLDL, CALCULATED 8 05/01/2013 12:05 PM    TRIGLYCERIDE 39 05/01/2013 12:05 PM          Social History:  History   Substance Use Topics   ??? Smoking status: Light Tobacco Smoker     Types: Cigars   ??? Smokeless tobacco: Never Used      Comment: rare occasion   ??? Alcohol Use: 1.5 oz/week     3 Glasses of wine per week      Comment: 3 er week       Family History:  Family History   Problem Relation Age of Onset   ??? Hypertension Mother    ??? Cancer Father      ?prostate cancer, bone cancer       Review of Symptoms:  A comprehensive review of systems ,particularly related to cardiovascular system, was negative except for that written in the HPI.    Physical Exam:    BP 112/78 mmHg   Pulse 72   Ht 5\' 11"  (1.803 m)   Wt 190 lb (86.183 kg)   BMI 26.51 kg/m2    Physical Exam:  Gen: Well-developed, well-nourished, in no acute distress  alert and oriented x 3  HEENT:  Pink conjunctivae, Hearing grossly normal.No scleral icterus or conjunctival, moist mucous membranes  Neck: Supple,No JVD, No Carotid Bruit, Thyroid- non tender No cervical lymphadenopathy  Resp: No accessory muscle use, Clear breath sounds, No rales or rhonchi  Card: Irregular Rate,Rythm,Normal S1, S2, 1/6 systolic murmurs, rubs or gallop. No thrills.     MSK: No cyanosis or clubbing, good capillary refill  Skin: No rashes or ulcers, no bruising   Neuro:  Cranial nerves are grossly intact, moving all four extremities, no focal deficit, follows commands appropriately  Psych:  Good insight, oriented to person, place and time, alert, Nml Affect  LE: <1+ edema  Vascular: Distal Pulses 2+ and symmetric        Medication change on this office visit: I have reviewed patients medication list and have made no changes today.      Assessment/recommendation:   1)Atrial fibrillation  -rate controlled    2)Edema possibly related to Norvasc he was started on  -his edema is unchanged and he actually thinks it is better    3)Dyslipidemia  -Followed by Dr.Jackson and has been good    4) Shortness of breath  -In setting of normal EF and increased right heart dimensions  -unsure if he may have idiopathic pulmonary HTN  -will check CT to look for dilated pulmonary vessels  -unsure if pulmonary consult to make this dx is needed and then if he is still SOB use of phosphodiesterase inhibitors may be an option. Again, his sx's may be just related to age, diastolic dysfunction and deconditioning. May also be related to not using his CPAP    5) return in 6 mo or PRN   ?? I have extensively reviewed all testing with the patient.    I have discussed the diagnosis with the patient and the intended plan as seen in the above orders.  Questions were answered concerning future plans.  I have discussed medication side effects and warnings with the patient as well.    Seleta RhymesJean O Kibble is in agreement to the plan listed above and wishes to proceed.     he  was instructed not to smoke, eat heart healthy diet  and to exercise.     Thank you for this consult.    Rushie ChestnutMark Norris Bodley, MD

## 2013-12-24 NOTE — Telephone Encounter (Signed)
Not currently under my care.

## 2013-12-25 ENCOUNTER — Encounter

## 2013-12-25 NOTE — Telephone Encounter (Signed)
Insurance co will not approve CT chest for pt. Even after using idiopathic pulm HTN. Any other testing, DX codes or see Pulm?

## 2013-12-25 NOTE — Telephone Encounter (Signed)
1) Just see pulmonary

## 2013-12-27 DIAGNOSIS — I4891 Unspecified atrial fibrillation: Secondary | ICD-10-CM | POA: Diagnosis not present

## 2013-12-27 DIAGNOSIS — Z7901 Long term (current) use of anticoagulants: Secondary | ICD-10-CM | POA: Diagnosis not present

## 2013-12-27 LAB — AMB POC PT/INR
INR POC: 2.4
Prothrombin time (POC): 29.3 s

## 2013-12-27 NOTE — Telephone Encounter (Signed)
Informed pt and faxed request to pulm assoc.

## 2014-01-18 ENCOUNTER — Encounter

## 2014-01-18 DIAGNOSIS — R0602 Shortness of breath: Secondary | ICD-10-CM | POA: Diagnosis not present

## 2014-01-22 ENCOUNTER — Encounter

## 2014-01-23 MED ORDER — LOVASTATIN 20 MG TAB
20 mg | ORAL_TABLET | Freq: Every evening | ORAL | Status: DC
Start: 2014-01-23 — End: 2014-11-03

## 2014-01-23 NOTE — Telephone Encounter (Signed)
From: Seleta Rhymes  To: Jennye Boroughs, MD  Sent: 01/22/2014 1:49 PM EDT  Subject:  Medication Renewal Request    Original  authorizing provider: Jennye Boroughs, MD    Seleta Rhymes would like a refill of the following medications:  lovastatin  (MEVACOR) 20 mg tablet Jennye Boroughs, MD]    Preferred  pharmacy: [facility] - [place], [place] - [address]  [address]    Comment:

## 2014-02-06 DIAGNOSIS — Z7901 Long term (current) use of anticoagulants: Secondary | ICD-10-CM | POA: Diagnosis not present

## 2014-02-06 LAB — AMB POC PT/INR
INR POC: 4.6
Prothrombin time (POC): 55.2 s

## 2014-02-11 DIAGNOSIS — L570 Actinic keratosis: Secondary | ICD-10-CM | POA: Diagnosis not present

## 2014-02-11 DIAGNOSIS — Z85828 Personal history of other malignant neoplasm of skin: Secondary | ICD-10-CM | POA: Diagnosis not present

## 2014-02-11 DIAGNOSIS — L218 Other seborrheic dermatitis: Secondary | ICD-10-CM | POA: Diagnosis not present

## 2014-02-12 DIAGNOSIS — Z7901 Long term (current) use of anticoagulants: Secondary | ICD-10-CM | POA: Diagnosis not present

## 2014-02-12 LAB — AMB POC PT/INR
INR POC: 2.4
Prothrombin time (POC): 28.5 s

## 2014-02-25 MED ORDER — DIGOX 125 MCG (0.125 MG) TABLET
125 mcg (0. mg) | ORAL_TABLET | ORAL | Status: DC
Start: 2014-02-25 — End: 2014-06-18

## 2014-02-26 ENCOUNTER — Other Ambulatory Visit: Admit: 2014-02-26 | Discharge: 2014-02-26 | Payer: MEDICARE

## 2014-02-26 DIAGNOSIS — Z7901 Long term (current) use of anticoagulants: Secondary | ICD-10-CM

## 2014-02-26 LAB — AMB POC PT/INR
INR POC: 3.3
Prothrombin time (POC): 40.2 s

## 2014-03-12 ENCOUNTER — Other Ambulatory Visit: Admit: 2014-03-12 | Discharge: 2014-03-12 | Payer: MEDICARE

## 2014-03-12 DIAGNOSIS — Z7901 Long term (current) use of anticoagulants: Secondary | ICD-10-CM

## 2014-03-12 LAB — AMB POC PT/INR
INR POC: 3.3
Prothrombin time (POC): 39.9 s

## 2014-03-20 DIAGNOSIS — Z23 Encounter for immunization: Secondary | ICD-10-CM | POA: Diagnosis not present

## 2014-03-26 ENCOUNTER — Other Ambulatory Visit: Admit: 2014-03-26 | Discharge: 2014-03-26 | Payer: MEDICARE

## 2014-03-26 DIAGNOSIS — I480 Paroxysmal atrial fibrillation: Secondary | ICD-10-CM

## 2014-03-26 LAB — AMB POC PT/INR
INR POC: 2
Prothrombin time (POC): 23.6 s

## 2014-03-27 ENCOUNTER — Encounter

## 2014-03-28 NOTE — Progress Notes (Signed)
Quick Note:        Continue current coumadin. Recheck monthly    ______

## 2014-04-25 ENCOUNTER — Other Ambulatory Visit: Admit: 2014-04-25 | Discharge: 2014-04-25 | Payer: MEDICARE

## 2014-04-25 DIAGNOSIS — Z7901 Long term (current) use of anticoagulants: Secondary | ICD-10-CM

## 2014-04-25 LAB — AMB POC PT/INR
INR POC: 1.5
Prothrombin time (POC): 18.3 s

## 2014-05-03 ENCOUNTER — Other Ambulatory Visit: Admit: 2014-05-03 | Discharge: 2014-05-03 | Payer: MEDICARE

## 2014-05-03 DIAGNOSIS — I480 Paroxysmal atrial fibrillation: Secondary | ICD-10-CM

## 2014-05-03 LAB — AMB POC PT/INR
INR POC: 2.7
Prothrombin time (POC): 32.1 s

## 2014-05-20 ENCOUNTER — Other Ambulatory Visit: Admit: 2014-05-20 | Discharge: 2014-05-20 | Payer: MEDICARE

## 2014-05-20 DIAGNOSIS — I480 Paroxysmal atrial fibrillation: Secondary | ICD-10-CM

## 2014-05-20 LAB — AMB POC PT/INR
INR POC: 2.9
Prothrombin time (POC): 34.7 s

## 2014-06-03 MED ORDER — AMLODIPINE 5 MG TAB
5 mg | ORAL_TABLET | ORAL | Status: DC
Start: 2014-06-03 — End: 2015-07-05

## 2014-06-18 MED ORDER — DIGOX 125 MCG (0.125 MG) TABLET
125 mcg (0. mg) | ORAL_TABLET | ORAL | Status: DC
Start: 2014-06-18 — End: 2014-11-01

## 2014-06-20 ENCOUNTER — Other Ambulatory Visit: Admit: 2014-06-20 | Discharge: 2014-06-20 | Payer: MEDICARE

## 2014-06-20 DIAGNOSIS — I480 Paroxysmal atrial fibrillation: Secondary | ICD-10-CM

## 2014-06-20 LAB — AMB POC PT/INR
INR POC: 3.6
Prothrombin time (POC): 43.2 s

## 2014-06-21 ENCOUNTER — Ambulatory Visit: Admit: 2014-06-21 | Discharge: 2014-06-21 | Payer: MEDICARE | Attending: Specialist

## 2014-06-21 ENCOUNTER — Ambulatory Visit: Attending: Specialist

## 2014-06-21 DIAGNOSIS — E789 Disorder of lipoprotein metabolism, unspecified: Secondary | ICD-10-CM

## 2014-06-21 DIAGNOSIS — R0602 Shortness of breath: Secondary | ICD-10-CM | POA: Diagnosis not present

## 2014-06-21 DIAGNOSIS — I10 Essential (primary) hypertension: Secondary | ICD-10-CM | POA: Diagnosis not present

## 2014-06-21 DIAGNOSIS — I480 Paroxysmal atrial fibrillation: Secondary | ICD-10-CM | POA: Diagnosis not present

## 2014-06-21 NOTE — Progress Notes (Signed)
HISTORY OF PRESENTING ILLNESS      Cory Dalton is a 81 y.o. male with hypertension, hyperlipidemia and atrial fibrillation referred for 6 month follow up. Cardiac risk factors: male gender, hypertension, dyslipidemia. I have personally obtained the history from the patient.      He is doing well. He typically walks regularly. He notes that he has been having some difficulty breathing when he sleeps. He denies having a sleep study in the past. The patient denies chest pain/shortness of breath, orthopnea, PND, LE edema, palpitations, syncope, presyncope or fatigue.          ACTIVE PROBLEM LIST     Patient Active Problem List    Diagnosis Date Noted   ??? Lumbar compression fracture ([place]) 06/14/2013   ??? 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 Lipid disorder 06/26/2012   ??? Abnormal CT of the abdomen 05/02/2012   ??? BPH (benign prostatic hyperplasia) 10/12/2011   ??? HTN (hypertension) 10/12/2011   ??? S/P colonoscopy 10/12/2011   ??? H/O knee surgery 10/12/2011   ??? Hernia 10/12/2011   ??? Paroxysmal atrial fibrillation (HCC) 07/04/2007   ??? BCC (basal cell carcinoma of skin) 07/04/2007   ??? Resting tremor 07/04/2007           PAST MEDICAL HISTORY     Past Medical History   Diagnosis Date   ??? Paroxysmal atrial fibrillation ([place]) 07/04/2007   ??? BPH 07/04/2007   ??? Resting tremor 07/04/2007   ??? BCC (basal cell carcinoma of skin) 07/04/2007   ??? Atrial fibrillation ([place])    ??? Skin cancer    ??? Hypertension            PAST SURGICAL HISTORY     Past Surgical History   Procedure Laterality Date   ??? Hx hernia repair  04/2010   ??? Hx cataract removal            ALLERGIES     No Known Allergies       FAMILY HISTORY     Family History   Problem Relation Age of Onset   ??? Hypertension Mother    ??? Cancer Father      ?prostate cancer, bone cancer    negative for cardiac disease       SOCIAL HISTORY     History     Social History   ??? Marital Status: MARRIED     Spouse Name: N/A     Number of Children: N/A    ??? Years of Education: N/A     Social History Main Topics   ??? Smoking status: Light Tobacco Smoker     Types: Cigars   ??? Smokeless tobacco: Never Used      Comment: rare occasion   ??? Alcohol Use: 1.5 oz/week     3 Glasses of wine per week      Comment: 3 er week   ??? Drug Use: No   ??? Sexual Activity: Not on file     Other Topics Concern   ??? Not on file     Social History Narrative         MEDICATIONS     Current Outpatient Prescriptions   Medication Sig   ??? docusate sodium (COLACE) 100 mg capsule Take 100 mg by mouth daily.   ??? DIGOX 125 mcg tablet TAKE 1 TABLET BY MOUTH DAILY   ??? amLODIPine (NORVASC) 5 mg tablet TAKE 1 TABLET BY MOUTH EVERY DAY   ??? lovastatin (MEVACOR)  20 mg tablet Take 1 Tab by mouth nightly.   ??? warfarin (COUMADIN) 2.5 mg tablet Take 1 Tab by mouth daily.   ??? AVODART 0.5 mg capsule TAKE 1 CAPSULE DAILY   ??? triamterene-hydrochlorothiazide (MAXZIDE) 37.5-25 mg per tablet TAKE 1 TABLET BY MOUTH EVERY DAY   ??? tamsulosin (FLOMAX) 0.4 mg capsule Take 1 Cap by mouth daily.   ??? metoprolol (LOPRESSOR) 50 mg tablet Take 0.5 Tabs by mouth two (2) times a day.   ??? acetaminophen (TYLENOL) 500 mg tablet Take 1,000 mg by mouth every six (6) hours as needed for Pain.     No current facility-administered medications for this visit.       I have reviewed the nurses notes, vitals, problem list, allergy list, medical history, family, social history and medications.       REVIEW OF SYMPTOMS      General: Pt denies excessive weight gain or loss. Pt is able to conduct ADL's  HEENT: Denies blurred vision, headaches, hearing loss, epistaxis and difficulty swallowing.  Respiratory: Denies cough, congestion, shortness of breath, DOE, wheezing or stridor.  Cardiovascular: Denies precordial pain, palpitations, edema or PND  Gastrointestinal: Denies poor appetite, indigestion, abdominal pain or blood in stool  Genitourinary: Denies hematuria, dysuria, increased urinary frequency   Musculoskeletal: Denies joint pain or swelling from muscles or joints  Neurologic: Denies tremor, paresthesias, headache, or sensory motor disturbance  Psychiatric: Denies confusion, insomnia, depression  Integumentray: Denies rash, itching or ulcers.  Hematologic: Denies easy bruising, bleeding     PHYSICAL EXAMINATION      Filed Vitals:    06/21/14 1143   BP: 130/80   Pulse: 76   Height: 5' 11" (1.803 m)   Weight: 193 lb (87.544 kg)     General: Well developed, in no acute distress.  HEENT: No jaundice, oral mucosa moist, no oral ulcers  Neck: Supple, no stiffness, no lymphadenopathy, supple  Heart: ??irregularly irregular with 2/6 systolic ejection murmur   Respiratory: Clear bilaterally x 4, no wheezing or rales  Abdomen:?? ??Soft, non-tender, bowel sounds are active.??  Extremities: Trace pre tibial edema, normal cap refill, no cyanosis.  Musculoskeletal: No clubbing, no deformities  Neuro: A&Ox3, speech clear, gait stable, cooperative, no focal neurologic deficits  Skin: Skin color is normal. No rashes or lesions. Non diaphoretic, moist.  Vascular: 2+ pulses symmetric in all extremities       DIAGNOSTIC DATA      1.?? Echocardiogram????????????????????????????????????????????????????????????????????????????     (04/13/07)  EF 55-60%, BAE, Mild TR with    04/2012 echocardiogram:  SUMMARY:  Left ventricle: Systolic function was normal. Ejection fraction was  estimated to be 65 %. There were no regional wall motion abnormalities.    Right ventricle: The ventricle was mildly to moderately dilated. Systolic  function was mildly reduced.    Left atrium: The atrium was markedly dilated.    Atrial septum: The septum bows from right to left, consistent with  increased right atrial pressure.    Right atrium: The atrium was moderately to markedly dilated.    Mitral valve: There was mild to moderate regurgitation.    Tricuspid valve: There was severe regurgitation. There was moderate to  severe pulmonary hypertension.     Inferior vena cava, hepatic veins: The inferior vena cava was mildly  dilated. Respirophasic changes were blunted (less than 50% variation).    06/22/13  Left ventricle: Systolic function was normal. Ejection fraction was  estimated to be 60 %. There were no regional  wall motion abnormalities.    Right ventricle: The ventricle was mildly to moderately dilated. Systolic  function was mildly reduced.    Left atrium: The atrium was moderately dilated.    Right atrium: The atrium was moderately dilated.    Mitral valve: There was mild regurgitation.    Aortic valve: Normal valve structure. The valve was probably trileaflet.  Leaflets exhibited sclerosis without stenosis.    Tricuspid valve: There was moderate to severe regurgitation.     2. Cholesterol profile  ??????????????????????????????????   (05/01/13): TC 146, HDL 80, LDL 58, TG 39       LABORATORY DATA      Lab Results   Component Value Date/Time    WBC 9.1 06/12/2013 07:42 PM    HEMOGLOBIN (POC) 14.2 05/04/2012 12:00 PM    HGB 13.6 06/12/2013 07:42 PM    HCT 39.4 06/12/2013 07:42 PM    PLATELET 187 06/12/2013 07:42 PM    MCV 93.6 06/12/2013 07:42 PM      Lab Results   Component Value Date/Time    SODIUM 137 06/12/2013 07:42 PM    POTASSIUM 3.9 06/12/2013 07:42 PM    CHLORIDE 101 06/12/2013 07:42 PM    CO2 25 06/12/2013 07:42 PM    ANION GAP 11 06/12/2013 07:42 PM    GLUCOSE 85 06/12/2013 07:42 PM    BUN 20 06/12/2013 07:42 PM    CREATININE 0.68 06/12/2013 07:42 PM    BUN/CREATININE RATIO 29 06/12/2013 07:42 PM    GFR EST AA >60 06/12/2013 07:42 PM    GFR EST NON-AA >60 06/12/2013 07:42 PM    CALCIUM 9.1 06/12/2013 07:42 PM    BILIRUBIN, TOTAL 0.7 06/12/2013 07:42 PM    ALT 22 06/12/2013 07:42 PM    AST 24 06/12/2013 07:42 PM    ALK. PHOSPHATASE 74 06/12/2013 07:42 PM    PROTEIN, TOTAL 6.9 06/12/2013 07:42 PM    ALBUMIN 3.3 06/12/2013 07:42 PM    GLOBULIN 3.6 06/12/2013 07:42 PM    A-G RATIO 0.9 06/12/2013 07:42 PM           ASSESSMENT/PLAN      1. Atrial fibrillation    A. rate controlled   B. Doing well without issues from anticoagulation  2. Edema possibly related to Norvasc he was started on   A. Edema at baseline  3. Dyslipidemia   A. Followed by Dr.Jackson and has been good  4. Shortness of breath     5. Follow up in 6 mo. Or PRn       FOLLOW-UP       Thank you,  Raynelle Chary, MD for involving me in the care of this extraordinarily pleasant male. Please do not hesitate to contact me for further questions/concerns.     This note was written by Louie Bun, scribekick, as dictated by Vennie Homans, MD.      Vennie Homans, MD, [facility]    [facility]  [address]   [place], [place] [address]

## 2014-06-23 NOTE — Progress Notes (Signed)
Faxed request, demo and info to [facility] for sleep eval

## 2014-06-27 NOTE — Addendum Note (Signed)
Addended by: Lorella NimrodHOFFMAN, KRISTEN on: 06/27/2014 10:23 AM      Modules accepted: Orders

## 2014-06-29 MED ORDER — METOPROLOL TARTRATE 50 MG TAB
50 mg | ORAL_TABLET | ORAL | Status: DC
Start: 2014-06-29 — End: 2014-11-04

## 2014-06-29 MED ORDER — METOPROLOL TARTRATE 50 MG TAB
50 mg | ORAL_TABLET | ORAL | Status: DC
Start: 2014-06-29 — End: 2014-06-29

## 2014-07-01 ENCOUNTER — Encounter

## 2014-07-02 ENCOUNTER — Other Ambulatory Visit: Admit: 2014-07-02 | Discharge: 2014-07-02 | Payer: MEDICARE

## 2014-07-02 DIAGNOSIS — I480 Paroxysmal atrial fibrillation: Secondary | ICD-10-CM

## 2014-07-02 LAB — AMB POC PT/INR
INR POC: 3.1
Prothrombin time (POC): 37.8 s

## 2014-07-12 ENCOUNTER — Encounter

## 2014-07-15 ENCOUNTER — Other Ambulatory Visit: Admit: 2014-07-15 | Discharge: 2014-07-15 | Payer: MEDICARE

## 2014-07-15 DIAGNOSIS — I480 Paroxysmal atrial fibrillation: Secondary | ICD-10-CM

## 2014-07-15 LAB — AMB POC PT/INR
INR POC: 1.8
Prothrombin time (POC): 21.4 s

## 2014-07-29 ENCOUNTER — Encounter

## 2014-07-30 MED ORDER — TAMSULOSIN SR 0.4 MG 24 HR CAP
0.4 mg | ORAL_CAPSULE | ORAL | Status: DC
Start: 2014-07-30 — End: 2014-11-14

## 2014-08-02 ENCOUNTER — Other Ambulatory Visit: Admit: 2014-08-02 | Discharge: 2014-08-02 | Payer: MEDICARE

## 2014-08-02 DIAGNOSIS — I480 Paroxysmal atrial fibrillation: Secondary | ICD-10-CM

## 2014-08-02 LAB — AMB POC PT/INR
INR POC: 1.8
Prothrombin time (POC): 22.1 s

## 2014-08-12 DIAGNOSIS — L820 Inflamed seborrheic keratosis: Secondary | ICD-10-CM | POA: Diagnosis not present

## 2014-08-12 DIAGNOSIS — Z85828 Personal history of other malignant neoplasm of skin: Secondary | ICD-10-CM | POA: Diagnosis not present

## 2014-08-12 DIAGNOSIS — D170 Benign lipomatous neoplasm of skin and subcutaneous tissue of head, face and neck: Secondary | ICD-10-CM | POA: Diagnosis not present

## 2014-08-12 DIAGNOSIS — L812 Freckles: Secondary | ICD-10-CM | POA: Diagnosis not present

## 2014-08-12 DIAGNOSIS — L570 Actinic keratosis: Secondary | ICD-10-CM | POA: Diagnosis not present

## 2014-08-12 DIAGNOSIS — D1801 Hemangioma of skin and subcutaneous tissue: Secondary | ICD-10-CM | POA: Diagnosis not present

## 2014-08-12 DIAGNOSIS — C44310 Basal cell carcinoma of skin of unspecified parts of face: Secondary | ICD-10-CM | POA: Diagnosis not present

## 2014-08-16 ENCOUNTER — Encounter

## 2014-08-20 ENCOUNTER — Other Ambulatory Visit: Admit: 2014-08-20 | Discharge: 2014-08-20 | Payer: MEDICARE

## 2014-08-20 DIAGNOSIS — I480 Paroxysmal atrial fibrillation: Secondary | ICD-10-CM

## 2014-08-20 LAB — AMB POC PT/INR
INR POC: 1.5
Prothrombin time (POC): 17.4 s

## 2014-09-02 ENCOUNTER — Other Ambulatory Visit: Admit: 2014-09-02 | Discharge: 2014-09-02 | Payer: MEDICARE

## 2014-09-02 DIAGNOSIS — I480 Paroxysmal atrial fibrillation: Secondary | ICD-10-CM

## 2014-09-02 LAB — AMB POC PT/INR
INR POC: 2.9
Prothrombin time (POC): 34.8 s

## 2014-09-02 MED ORDER — TRIAMTERENE-HYDROCHLOROTHIAZIDE 37.5 MG-25 MG TAB
ORAL_TABLET | ORAL | Status: DC
Start: 2014-09-02 — End: 2015-01-05

## 2014-09-25 DIAGNOSIS — C4491 Basal cell carcinoma of skin, unspecified: Secondary | ICD-10-CM | POA: Diagnosis not present

## 2014-09-25 DIAGNOSIS — C44310 Basal cell carcinoma of skin of unspecified parts of face: Secondary | ICD-10-CM | POA: Diagnosis not present

## 2014-10-01 ENCOUNTER — Other Ambulatory Visit: Admit: 2014-10-01 | Discharge: 2014-10-01 | Payer: MEDICARE

## 2014-10-01 DIAGNOSIS — I480 Paroxysmal atrial fibrillation: Secondary | ICD-10-CM

## 2014-10-01 DIAGNOSIS — M4806 Spinal stenosis, lumbar region: Secondary | ICD-10-CM | POA: Diagnosis not present

## 2014-10-01 DIAGNOSIS — M5432 Sciatica, left side: Secondary | ICD-10-CM | POA: Diagnosis not present

## 2014-10-01 LAB — AMB POC PT/INR
INR POC: 2.6
Prothrombin time (POC): 31.2 s

## 2014-10-08 ENCOUNTER — Ambulatory Visit: Admit: 2014-10-08 | Discharge: 2014-10-08 | Payer: MEDICARE | Attending: Family Medicine

## 2014-10-08 DIAGNOSIS — Z0000 Encounter for general adult medical examination without abnormal findings: Secondary | ICD-10-CM

## 2014-10-08 DIAGNOSIS — Z5181 Encounter for therapeutic drug level monitoring: Secondary | ICD-10-CM | POA: Diagnosis not present

## 2014-10-08 DIAGNOSIS — Z1389 Encounter for screening for other disorder: Secondary | ICD-10-CM | POA: Diagnosis not present

## 2014-10-08 DIAGNOSIS — Z7901 Long term (current) use of anticoagulants: Secondary | ICD-10-CM | POA: Diagnosis not present

## 2014-10-08 DIAGNOSIS — I10 Essential (primary) hypertension: Secondary | ICD-10-CM | POA: Diagnosis not present

## 2014-10-08 LAB — AMB POC PT/INR
INR POC: 1.2
Prothrombin time (POC): 14.1 s

## 2014-10-08 NOTE — Progress Notes (Signed)
I reviewed with the resident the medical history and the resident's findings on the physical examination.  I discussed with the resident the patient's diagnosis and concur with the plan.

## 2014-10-08 NOTE — Addendum Note (Signed)
Addended by: Billey CoURNER, SHERYL C on: 10/08/2014 02:13 PM      Modules accepted: Orders

## 2014-10-08 NOTE — Patient Instructions (Signed)
Taking Warfarin Safely: Care Instructions  Your Care Instructions  Warfarin is a medicine that you take to prevent blood clots. It is often called a blood thinner. Doctors give warfarin (such as Coumadin) to reduce the risk of blood clots. You may be at risk for blood clots if you have atrial fibrillation or deep vein thrombosis. Some other health problems may also put you at risk.  Warfarin slows the amount of time it takes for your blood to clot. It can cause bleeding problems. Even if you've been taking warfarin for a while, it's important to know how to take it safely.  Foods and other medicines can affect the way warfarin works. Some can make warfarin work too well. This can cause bleeding problems. And some can make it work poorly, so that it does not prevent blood clots very well.  You will need regular blood tests to check how long it takes for your blood to form a clot. This test is called a PT or prothrombin time test. The result of the test is called an INR level. Depending on the test results, your doctor or anticoagulation clinic may adjust your dose of warfarin.  Follow-up care is a key part of your treatment and safety. Be sure to make and go to all appointments, and call your doctor if you are having problems. It's also a good idea to know your test results and keep a list of the medicines you take.  How can you care for yourself at home?  Take warfarin safely   ?? Take your warfarin at the same time each day.  ?? If you miss a dose of warfarin, don't take an extra dose to make up for it. Your doctor can tell you exactly what to do so you don't take too much or too little.  ?? Wear medical alert jewelry that lets others know that you take warfarin. You can buy this at most drugstores.  ?? Don't take warfarin if you are pregnant or planning to get pregnant. Talk to your doctor about how you can prevent getting pregnant while you are taking it.   ?? Don't change your dose or stop taking warfarin unless your doctor tells you to.  Effects of medicines and food on warfarin  ?? Don't start or stop taking any medicines, vitamins, or natural remedies unless you first talk to your doctor. Many medicines can affect how warfarin works. These include aspirin and other pain relievers, over-the-counter medicines, multivitamins, dietary supplements, and herbal products.  ?? Tell all of your doctors and pharmacists that you take warfarin. Some prescription medicines can affect how warfarin works.  ?? Keep the amount of vitamin K in your diet about the same from day to day. Do not suddenly eat a lot more or a lot less food that is rich in vitamin K than you usually do. Vitamin K affects how warfarin works and how your blood clots. Talk with your doctor before making big changes in your diet. Vitamin K is in many foods, such as:  ?? Leafy greens, such as kale, cabbage, spinach, Swiss chard, and lettuce.  ?? Canola and soybean oils.  ?? Green vegetables, such as asparagus, broccoli, and Brussels sprouts.  ?? Vegetable drinks, green tea leaves, and some dietary supplement drinks.  ?? Avoid cranberry juice and other cranberry products. They can increase the effects of warfarin.  ?? Limit your use of alcohol.  Avoid bleeding by preventing falls and injuries  ?? Wear slippers or shoes   with nonskid soles.  ?? Remove throw rugs and clutter.  ?? Rearrange furniture and electrical cords to keep them out of walking paths.  ?? Keep stairways, porches, and outside walkways well lit. Use night-lights in hallways and bathrooms.  ?? Be extra careful when you work with sharp tools or knives.  When should you call for help?  Call 911 anytime you think you may need emergency care. For example, call if:  ?? You have a sudden, severe headache that is different from past headaches.  Call your doctor now or seek immediate medical care if:  ?? You have any abnormal bleeding, such as:  ?? Nosebleeds.   ?? Vaginal bleeding that is different (heavier, more frequent, at a different time of the month) than what you are used to.  ?? Bloody or black stools, or rectal bleeding.  ?? Bloody or pink urine.  Watch closely for changes in your health, and be sure to contact your doctor if you have any problems.   Where can you learn more?   Go to http://www.healthwise.net/[facility]  Enter N655 in the search box to learn more about "Taking Warfarin Safely: Care Instructions."   ?? 2006-2016 Healthwise, Incorporated. Care instructions adapted under license by [place] (which disclaims liability or warranty for this information). This care instruction is for use with your licensed healthcare professional. If you have questions about a medical condition or this instruction, always ask your healthcare professional. Healthwise, Incorporated disclaims any warranty or liability for your use of this information.  Content Version: 10.8.513193; Current as of: July 13, 2013

## 2014-10-08 NOTE — Progress Notes (Signed)
[facility]  [address]  [place], [place] [address]  [phone]             Date of visit: 10/08/2014       This is an Initial Medicare Annual Wellness Visit (AWV), (Performed more than 12 months after effective date of Medicare Part B enrollment and 12 months after last preventive visit, Once in a lifetime)    I have reviewed the patient's medical history in detail and updated the computerized patient record.     History obtained from: the patient.    Concerns today   (Patient understands that medical problems addressed today may incur additional cost as this is a preventive visit)  -has chronic back pain and needs permission to get off of coumadin for 4 days in order to get the instruction    History     Patient Active Problem List   Diagnosis Code   ??? Paroxysmal atrial fibrillation (HCC) I48.0   ??? BCC (basal cell carcinoma of skin) C44.91   ??? Resting tremor R25.9   ??? BPH (benign prostatic hyperplasia) N40.0   ??? HTN (hypertension) I10   ??? S/P colonoscopy Z98.89   ??? H/O knee surgery Z98.89   ??? Hernia K46.9   ??? Abnormal CT of the abdomen R93.5   ??? Lipid disorder E78.9   ??? Lumbar compression fracture (HCC) S32.000A   ??? Lumbar disc herniation with radiculopathy M51.16     Past Medical History   Diagnosis Date   ??? Paroxysmal atrial fibrillation (HCC) 07/04/2007   ??? BPH 07/04/2007   ??? Resting tremor 07/04/2007   ??? BCC (basal cell carcinoma of skin) 07/04/2007   ??? Atrial fibrillation (HCC)    ??? Skin cancer    ??? Hypertension       Past Surgical History   Procedure Laterality Date   ??? Hx hernia repair  04/2010   ??? Hx cataract removal       No Known Allergies  Current Outpatient Prescriptions   Medication Sig Dispense Refill   ??? triamterene-hydrochlorothiazide (MAXZIDE) 37.5-25 mg per tablet TAKE 1 TABLET BY MOUTH EVERY DAY 90 Tab 0   ??? tamsulosin (FLOMAX) 0.4 mg capsule TAKE 1 CAPSULE BY MOUTH EVERY DAY 90 Cap 0   ??? docusate sodium (COLACE) 100 mg capsule Take 100 mg by mouth daily.      ??? DIGOX 125 mcg tablet TAKE 1 TABLET BY MOUTH DAILY 90 Tab 0   ??? amLODIPine (NORVASC) 5 mg tablet TAKE 1 TABLET BY MOUTH EVERY DAY 90 Tab 3   ??? lovastatin (MEVACOR) 20 mg tablet Take 1 Tab by mouth nightly. 90 Tab 1   ??? warfarin (COUMADIN) 2.5 mg tablet Take 1 Tab by mouth daily. 90 Tab 3   ??? AVODART 0.5 mg capsule TAKE 1 CAPSULE DAILY 90 Cap 3   ??? acetaminophen (TYLENOL) 500 mg tablet Take 1,000 mg by mouth every six (6) hours as needed for Pain.     ??? metoprolol (LOPRESSOR) 50 mg tablet TAKE 1/2 TABLET BY MOUTH TWICE DAILY 180 Tab 0     Family History   Problem Relation Age of Onset   ??? Hypertension Mother    ??? Cancer Father      ?prostate cancer, bone cancer     History   Substance Use Topics   ??? Smoking status: Light Tobacco Smoker     Types: Cigars   ??? Smokeless tobacco: Never Used  Comment: rare occasion   ??? Alcohol Use: 1.5 oz/week     3 Glasses of wine per week      Comment: 3 er week       Specialists/Care Team   Seleta RhymesJean O. Lingle has established care with the following healthcare providers:  Dr Guy Beginaniel Martin (spine specialist)  Dr Kayren EavesIrby (ortho [facility])  Dr Jacqulyn Lineroleresco (cardiologist)  Dr Loyd RosenthalJackson  Health Risk Assessment     Demographics   male  81 y.o.  WHITE OR CAUCASIAN    General Health Questions   -During the past 4 weeks:   -how would you rate your health in general? Good   -how often have you been bothered by feeling dizzy when standing up? never   -how much have you been bothered by bodily pain? moderately   -Have you noticed any hearing difficulties? no   -has your physical and emotional health limited your social activities with family or friends? no    Emotional Health Questions   -Do you have a history of depression, anxiety, or emotional problems? no  -Over the past 2 weeks, have you felt down, depressed or hopeless? no  -Over the past 2 weeks, have you felt little interest or pleasure in doing things? no    Health Habits   Please describe your diet habits: not too good. Eat out a lot   Do you get 5 servings of fruits or vegetables daily? Not realy  Do you exercise regularly? [facility] when back pain started.    Activities of Daily Living and Functional Status   -Do you need help with eating, walking, dressing, bathing, toileting, the phone, transportation, shopping, preparing meals, housework, laundry, medications or managing money? no  -In the past four weeks, was someone available to help you if you needed and wanted help with anything? Yes, neighbors  -Are you confident are you that you can control and manage most of your health problems? yes  -Have you been given information to help you keep track of your medications? yes  -How often do you have trouble taking your medications as prescribed? never    Fall Risk and Home Safety   Have you fallen 2 or more times in the past year? no  Does your home have rugs in the hallway, lack grab bars in the bathroom, lack handrails on the stairs or have poor lighting? no  Do you have smoke detectors and check them regularly? yes  Do you have difficulties driving a car? no  Do you always fasten your seat belt when you are in a car? yes    Review of Systems (if indicated for problems addressed today)   Back pain: present for past 3 weeks. Being evaluated by spine specialist    Physical Examination     Filed Vitals:    10/08/14 0938   BP: 106/67   Pulse: 75   Temp: 97.3 ??F (36.3 ??C)   TempSrc: Oral   Resp: 16   Height: 5\' 11"  (1.803 m)   Weight: 184 lb (83.462 kg)   SpO2: 100%     Body mass index is 25.67 kg/(m^2).   No exam data present  Was the patient's timed Up & Go test unsteady or longer than 30 seconds? no    Evaluation of Cognitive Function   Mood/affect:  neutral  Orientation: Person, Place, Time and Situation  Appearance: age appropriate  Family member/caregiver input: none    Additional  exam if indicated for problems addressed today:  none    Advice/Referrals/Counseling (as indicated)    Education and counseling provided for any problems identified above: none    Preventive Services     (Preventive care checklist to be included in patient instructions)  Discussed today Done Previously     x Pneumococcal vaccines    x Flu vaccine     Hepatitis B vaccine (if at risk)    x Shingles vaccine    x TDAP vaccine     Mammogram     Pap smear    x Colorectal cancer screening     Low-dose CT for lung cancer screening     Bone density test    x Glaucoma screening    x Cholesterol test     Diabetes screening test      AAA ultrasound (if family history)    x Diabetes self-management class     Nutritionist referral for diabetes or renal disease     Discussion of Advance Directive   Discussed with Seleta RhymesJean O. Prouse his ability to prepare and advance directive in the case that an injury or illness causes him to be unable to make health care decisions.   Has already made one, will furnish the clinic with a copy    Assessment/Plan   V70.0    ICD-10-CM ICD-9-CM    1. Routine general medical examination at a health care facility Z00.00 V70.0    2. Screening for alcoholism Z13.89 V79.1        No orders of the defined types were placed in this encounter.       Follow-up Disposition:  Return in about 1 year (around 10/08/2015).    Suan HalterElizabeth Tia Gelb, MD

## 2014-10-08 NOTE — Progress Notes (Signed)
Chief Complaint   Patient presents with   ??? Annual Wellness Visit     Medicare, patient not fasting     1. Have you been to the ER, urgent care clinic since your last visit?  Hospitalized since your last visit?No    2. Have you seen or consulted any other health care providers outside [facility] the [facility] since your last visit?  Include any pap smears or colon screening. No     Reviewed record in preparation for visit and have obtained necessary documentation.

## 2014-10-09 DIAGNOSIS — M5432 Sciatica, left side: Secondary | ICD-10-CM | POA: Diagnosis not present

## 2014-10-09 DIAGNOSIS — M4806 Spinal stenosis, lumbar region: Secondary | ICD-10-CM | POA: Diagnosis not present

## 2014-10-22 MED ORDER — DUTASTERIDE 0.5 MG CAP
0.5 mg | ORAL_CAPSULE | ORAL | Status: AC
Start: 2014-10-22 — End: ?

## 2014-10-28 ENCOUNTER — Other Ambulatory Visit: Admit: 2014-10-28 | Discharge: 2014-10-28 | Payer: MEDICARE

## 2014-10-28 DIAGNOSIS — I480 Paroxysmal atrial fibrillation: Secondary | ICD-10-CM

## 2014-10-28 LAB — AMB POC PT/INR
INR POC: 1.8
Prothrombin time (POC): 21.5 s

## 2014-10-29 ENCOUNTER — Encounter

## 2014-10-29 DIAGNOSIS — M4806 Spinal stenosis, lumbar region: Secondary | ICD-10-CM | POA: Diagnosis not present

## 2014-11-01 ENCOUNTER — Encounter

## 2014-11-01 NOTE — Telephone Encounter (Signed)
From: Seleta Rhymes  To: Jennye Boroughs, MD  Sent: 11/01/2014 10:47 AM EDT  Subject:  Medication Renewal Request    Original  authorizing provider: Jennye Boroughs, MD    Seleta Rhymes would like a refill of the following medications:  lovastatin  (MEVACOR) 20 mg tablet Jennye Boroughs, MD]  DIGOX  125 mcg tablet Jennye Boroughs, MD]    Preferred  pharmacy: [id number] - [place], [place] - [address]  [address] ([place]    Comment:

## 2014-11-04 ENCOUNTER — Other Ambulatory Visit: Admit: 2014-11-04 | Discharge: 2014-11-04 | Payer: MEDICARE

## 2014-11-04 DIAGNOSIS — I480 Paroxysmal atrial fibrillation: Secondary | ICD-10-CM

## 2014-11-04 LAB — AMB POC PT/INR
INR POC: 2.1
Prothrombin time (POC): 25.2 s

## 2014-11-04 MED ORDER — METOPROLOL TARTRATE 50 MG TAB
50 mg | ORAL_TABLET | Freq: Two times a day (BID) | ORAL | Status: DC
Start: 2014-11-04 — End: 2015-06-04

## 2014-11-04 MED ORDER — METOPROLOL TARTRATE 50 MG TAB
50 mg | ORAL_TABLET | ORAL | Status: DC
Start: 2014-11-04 — End: 2014-12-23

## 2014-11-04 MED ORDER — LOVASTATIN 20 MG TAB
20 mg | ORAL_TABLET | ORAL | Status: DC
Start: 2014-11-04 — End: 2014-12-28

## 2014-11-04 MED ORDER — DIGOXIN 0.125 MG TAB
0.125 mg | ORAL_TABLET | Freq: Every day | ORAL | Status: AC
Start: 2014-11-04 — End: ?

## 2014-11-06 ENCOUNTER — Inpatient Hospital Stay: Admit: 2014-11-06 | Payer: MEDICARE | Attending: Rehabilitative and Restorative Service Providers"

## 2014-11-06 DIAGNOSIS — M545 Low back pain: Secondary | ICD-10-CM

## 2014-11-06 NOTE — Progress Notes (Addendum)
PT INITIAL EVALUATION NOTE - MCR 2-15    Patient Name: Cory Dalton  Date:11/06/2014  DOB: 02/26/1927    Patient DOB Verified  [place]or: [place] MED[place] / Plan: [place] MEDICARE PART A & B / Product Type: Medicare /    In time:2:30PM  Out time:3:30PM  Total Treatment Time (min): 60  Total Timed Codes (min): 40  1:1 Treatment Time (MC only): 50   Visit #: 1     Treatment Area: Low back pain [M54.5]    SUBJECTIVE  Pain Level (0-10 scale): 3  Any medication changes, allergies to medications, adverse drug reactions, diagnosis change, or new procedure performed?:  No     Yes (see summary sheet for update)  Subjective:    Patient presents with diffuse, chronic midline low back pain with occasional radicular symptoms in left buttocks and leg. No reports of symptoms below the knee. Pain is aggravated by prolonged positioning and increased standing, walking, bending activities. He believes the symptoms have gradually gotten worse since April 2016. He began to use a walker to ambulate for long distances due to instability brought on by occasional sharp, shooting pain. No reported history of falls. He received an injection on Oct 16, 2014, however did not feel like it made a significant difference in his symptoms. Symptoms do improve with ice application and Tylenol, but effects are only temporary. He reports some sleep disturbance due to pain. Patient was previously seen in this clinic last year for similar complaints and was discharged with complete resolution of symptoms and independence in home exercise program to manage chronic symptoms. He used to engage in regular physical activity (walking in his neighborhood, swimming), but has recently been unable due to increased pain levels and increased activity demands as he is preparing to move out of his house.        OBJECTIVE  Posture:  Increased lordosis, forward trunk flexion  Gait and Functional Mobility: Mild antalgic gait, increased frontal plane sway   Palpation: TTP over spinous processes of L3-5; lower lumbar paraspinal m.  Joint Mobility: hypomobile throughout lower lumbar spine        Lumbar AROM:        R   L  Flexion    Limited by 25%        Extension   Limited by 50%        Side Bending   WNL   WNL      Rotation   Limited by 25% Limited by 50%          Aberrant movements:  Painful arc:  Yes   No  Instability catch:  Yes   No  Difficulty returning from flexion:  Yes   No  Reversal of lumbopelvic rhythm:  Yes   No      MUSCLE STRENGTH: All LE myotomes >4/5  Neurological: Sensation: intact  Special Tests:        Slump: negative bilaterally       Ober: negative bilaterally   SLR: negative for back pain bilaterally   FABER: positive bilaterally       Active SLR: positive on L         Modality rationale: decrease pain and increase tissue extensibility to improve the patient???s ability to tolerate prolonged sitting, standing, walking   Min Type Additional Details     Estim: Att   Unatt        TENS instruct  IFC  Premod   NMES                     Other:  w/US   w/ice   w/heat  Position:  Location:      Traction:  Cervical       Lumbar                        Prone          Supine                       Intermittent   Continuous Lbs:   before manual   after manual  w/heat      Ultrasound: Continuous    Pulsed at:                             Location:  W/cm2:     Paraffin         Location:   w/heat   10   Ice       Heat    Ice massage Position: supine  Location: low back      Laser    Other: Position:  Location:        Vasopneumatic Device Pressure:        lo  med  hi   Temperature:       Skin assessment post-treatment:  intact redness- no adverse reaction    redness ??? adverse reaction:     30 min Therapeutic Exercise:   See flow sheet :   Rationale: increase ROM, increase strength and improve coordination to improve the patient???s ability to tolerate prolonged sitting, standing,  walking without pain, improve ability to bend over and pick objects up off floor     With    TE    TA    neuro    other: Patient Education:  Review HEP     Progressed/Changed HEP based on:    positioning    body mechanics    transfers    heat/ice application     other:        Pain Level (0-10 scale) post treatment: 1    ASSESSMENT/Changes in Function:       See Plan of Care      Casilda Carls, SPT  11/06/2014  3:43 PM

## 2014-11-07 NOTE — Progress Notes (Signed)
[facility]  [address] [place], [address]  [place], [place] [address]  Phone: [phone]  Fax: [phone]    Plan of Care/Statement of Necessity for Physical Therapy Services  2-15    Patient name: Cory Dalton  DOB: 10-07-1926  Provider#: [id number]  Referral source: Guy Begin, MD      Medical/Treatment Diagnosis: Low back pain [M54.5]     Prior Hospitalization: see medical history     Comorbidities: Hypertension  Prior Level of Function:Independent without limitation  Medications: Verified on Patient Summary List  Start of Care: 11/06/2014      Onset Date: April 2016   The Plan of Care and following information is based on the information from the initial evaluation.    Assessment/ key information: Patient presents today with aggravation of chronic midline low back pain. He reports occasional radicular symptoms into left buttocks and leg after prolonged sitting. Negative slump and passive SLR tests at this time. The symptoms have gradually worsened since April 2016, which may be correlated to an increased in activity demands associated with putting his house on the market. He did receive an injection on Oct 16, 2014, however did not feel that it was effective. Today, patient demonstrates decreased LE ROM, poor lumbo-pelvic stability, and standing balance impairment. He was previously seen at this clinic last year for similar symptoms, and responded very well to intervention aimed at addressing functional activity tolerance and improving mobility.    Problem List: pain affecting function, decrease ROM, decrease strength, impaired gait/ balance, decrease ADL/ functional abilitiies, decrease activity tolerance and decrease flexibility/ joint mobility   Treatment Plan may include any combination of the following: Therapeutic exercise, Therapeutic activities, Neuromuscular re-education, Physical agent/modality, Gait/balance training, Manual therapy, Patient education,  Self Care training, Functional mobility training, Home safety training and Stair training  Patient / Family readiness to learn indicated by: trying to perform skills and interest  Persons(s) to be included in education: patient (P)  Barriers to Learning/Limitations: None  Patient Goal (s): ???I want to strengthen muscles in my legs to be able to walk around my neighborhood."  Patient Self Reported Health Status: good  Rehabilitation Potential: good    Short Term Goals: To be accomplished in 8  treatments:  1. Patient will be able to perform sit-stand without use of UE support with <3/10 pain.  2. Patient will be able to tolerate standing position for 15 minutes at a time with no increase in pain.  3. Patient will be able to squat and lift 10# from floor level with <3/10 pain.    Long Term Goals: To be accomplished in 16  treatments:  1. Patient will be able to perform sit-stand without use of UE support and no increase in pain.  2. Patient will be able to tolerate standing position for 30 minutes at a time without an increase in pain.  3. Patient will be able to squat and lift 20# from floor level with no pain.    Frequency / Duration: Patient to be seen 2 times per week for 8  weeks.    Patient/ Caregiver education and instruction: self care, activity modification and exercises      Plan of care has been reviewed with PTA    G-Codes (GP)  Mobility  [phone] Current  CK= 40-59%  [id number] Goal  CJ= 20-39%    The severity rating is based on clinical judgment and the FOTO Score score.    Certification Period: 05/24/2015  Casilda Carls, SPT  11/07/2014 10:14 AM    ________________________________________________________________________    I certify that the above Therapy Services are being furnished while the patient is under my care. I agree with the treatment plan and certify that this therapy is necessary.    Physician's Signature:____________________  Date:____________Time: _________

## 2014-11-08 ENCOUNTER — Inpatient Hospital Stay: Admit: 2014-11-08 | Payer: MEDICARE | Attending: Rehabilitative and Restorative Service Providers"

## 2014-11-08 DIAGNOSIS — M545 Low back pain: Secondary | ICD-10-CM | POA: Diagnosis not present

## 2014-11-08 NOTE — Progress Notes (Signed)
PT DAILY TREATMENT NOTE - MCR 2-15    Patient Name: Cory Dalton  Date:11/08/2014  DOB: 1927/05/05    Patient DOB Verified  Payor: [place] MEDICARE / Plan: [place] MEDICARE PART A & B / Product Type: Medicare /    In time:1:00PM  Out time:2:00PM  Total Treatment Time (min): 60  Total Timed Codes (min): 60  1:1 Treatment Time (MC only): 45   Visit #: 2     Treatment Area: Low back pain [M54.5]    SUBJECTIVE  Pain Level (0-10 scale): 2  Any medication changes, allergies to medications, adverse drug reactions, diagnosis change, or new procedure performed?:  No     Yes (see summary sheet for update)  Subjective functional status/changes:    No changes reported  Patient reports slight soreness after last visit, however is feeling better overall today.    OBJECTIVE    Modality rationale: decrease pain and increase tissue extensibility to improve the patient???s ability to tolerate prolonged sitting, standing, walking   Min Type Additional Details     Estim: Att   Unatt        TENS instruct                  IFC  Premod   NMES                     Other:  w/US   w/ice   w/heat  Position:  Location:      Traction:  Cervical       Lumbar                        Prone          Supine                       Intermittent   Continuous Lbs:   before manual   after manual  w/heat      Ultrasound: Continuous    Pulsed at:                             Location:  W/cm2:     Paraffin         Location:   w/heat   15   Ice       Heat    Ice massage Position: supine  Location: low back      Laser    Other: Position:  Location:        Vasopneumatic Device Pressure:        lo  med  hi   Temperature:       Skin assessment post-treatment:  intact redness- no adverse reaction    redness ??? adverse reaction:     45 min Therapeutic Exercise:   See flow sheet :   Rationale: increase ROM, increase strength and improve coordination to improve the patient???s ability to tolerate prolonged sitting, standing, walking          With    TE    TA    neuro     other: Patient Education:  Review HEP     Progressed/Changed HEP based on:    positioning    body mechanics    transfers    heat/ice application     other:      Other Objective/Functional Measures: Patient tolerated progression in exercises today to include the  recumbent bike and core stabilization exercises with no complications.    BP post-bike: 128/82     Pain Level (0-10 scale) post treatment: 1    ASSESSMENT/Changes in Function:     Patient will continue to benefit from skilled PT services to modify and progress therapeutic interventions, address functional mobility deficits, address ROM deficits, address strength deficits, analyze and address soft tissue restrictions, analyze and cue movement patterns, analyze and modify body mechanics/ergonomics and assess and modify postural abnormalities to attain remaining goals.       See Plan of Care    See progress note/recertification    See Discharge Summary         Progress towards goals / Updated goals:  Patient tolerating intervention well; will continue to progress appropriately to address functional limitations.    PLAN    Upgrade activities as tolerated       Continue plan of care    Update interventions per flow sheet         Discharge due to:_    Other:_      Casilda Carls, SPT  11/08/2014  1:50 PM

## 2014-11-12 ENCOUNTER — Inpatient Hospital Stay: Admit: 2014-11-12 | Payer: MEDICARE | Attending: Sports Medicine

## 2014-11-12 DIAGNOSIS — M545 Low back pain: Secondary | ICD-10-CM | POA: Diagnosis not present

## 2014-11-12 NOTE — Progress Notes (Addendum)
PT DAILY TREATMENT NOTE - MCR 2-15    Patient Name: Cory Dalton  Date:11/12/2014  DOB: [birth year]    Patient DOB Verified  Payor: [place] MEDICARE / Plan: [place] MEDICARE PART A & B / Product Type: Medicare /    In time:8:05PM  Out time:9:05PM  Total Treatment Time (min): 60  Total Timed Codes (min): 60  1:1 Treatment Time (MC only): 45   Visit #: 3     Treatment Area: Low back pain [M54.5]    SUBJECTIVE  Pain Level (0-10 scale): 2  Any medication changes, allergies to medications, adverse drug reactions, diagnosis change, or new procedure performed?:  No     Yes (see summary sheet for update)  Subjective functional status/changes:    No changes reported  Patient reports some soreness but he does have the radiating pain down the front of his legs today.    OBJECTIVE    Modality rationale: decrease pain and increase tissue extensibility to improve the patient???s ability to tolerate prolonged sitting, standing, walking   Min Type Additional Details     Estim: Att   Unatt        TENS instruct                  IFC  Premod   NMES                     Other:  w/US   w/ice   w/heat  Position:  Location:      Traction:  Cervical       Lumbar                        Prone          Supine                       Intermittent   Continuous Lbs:   before manual   after manual  w/heat      Ultrasound: Continuous    Pulsed at:                             Location:  W/cm2:     Paraffin         Location:   w/heat   15   Ice       Heat    Ice massage Position: supine  Location: low back      Laser    Other: Position:  Location:        Vasopneumatic Device Pressure:        lo  med  hi   Temperature:       Skin assessment post-treatment:  intact redness- no adverse reaction    redness ??? adverse reaction:     45 min Therapeutic Exercise:   See flow sheet :   Rationale: increase ROM, increase strength and improve coordination to improve the patient???s ability to tolerate prolonged sitting, standing, walking          With    TE    TA    neuro     other: Patient Education:  Review HEP     Progressed/Changed HEP based on:    positioning    body mechanics    transfers    heat/ice application     other:      Other Objective/Functional Measures: Patient tolerated today's new  stretches today with a decrease in low back pain following today's session.    Pain Level (0-10 scale) post treatment: 1    ASSESSMENT/Changes in Function:     Patient will continue to benefit from skilled PT services to modify and progress therapeutic interventions, address functional mobility deficits, address ROM deficits, address strength deficits, analyze and address soft tissue restrictions, analyze and cue movement patterns, analyze and modify body mechanics/ergonomics and assess and modify postural abnormalities to attain remaining goals.       See Plan of Care    See progress note/recertification    See Discharge Summary         Progress towards goals / Updated goals:  Patient tolerating intervention well; will continue to progress appropriately to address functional limitations.    PLAN    Upgrade activities as tolerated       Continue plan of care    Update interventions per flow sheet         Discharge due to:_    Other:_      Delila Kuklinski [place], PTA, CPT  11/12/2014  1:50 PM

## 2014-11-13 ENCOUNTER — Encounter

## 2014-11-14 ENCOUNTER — Inpatient Hospital Stay: Admit: 2014-11-14 | Payer: MEDICARE | Attending: Sports Medicine

## 2014-11-14 DIAGNOSIS — M545 Low back pain: Secondary | ICD-10-CM | POA: Diagnosis not present

## 2014-11-14 MED ORDER — TAMSULOSIN SR 0.4 MG 24 HR CAP
0.4 mg | ORAL_CAPSULE | Freq: Every day | ORAL | Status: AC
Start: 2014-11-14 — End: ?

## 2014-11-14 NOTE — Telephone Encounter (Signed)
From: Seleta Rhymes  To: Jennye Boroughs, MD  Sent: 11/13/2014 8:03 PM EDT  Subject:  Medication Renewal Request    Original  authorizing provider: Jennye Boroughs, MD    Seleta Rhymes would like a refill of the following medications:  tamsulosin  (FLOMAX) 0.4 mg capsule Jennye Boroughs, MD]    Preferred  pharmacy: [id number] - [place], [place] - [address]  [address] ([place]    Comment:

## 2014-11-14 NOTE — Progress Notes (Signed)
PT DAILY TREATMENT NOTE - MCR 2-15    Patient Name: Cory Dalton  Date:11/14/2014  DOB: 1927/05/12    Patient DOB Verified  Payor: [place] MEDICARE / Plan: [place] MEDICARE PART A & B / Product Type: Medicare /    In time:8:30 PM  Out time:9:30 PM  Total Treatment Time (min): 60  Total Timed Codes (min): 45  1:1 Treatment Time (MC only): 30  Visit #: 4     Treatment Area: Low back pain [M54.5]    SUBJECTIVE  Pain Level (0-10 scale): 2  Any medication changes, allergies to medications, adverse drug reactions, diagnosis change, or new procedure performed?:  No     Yes (see summary sheet for update)  Subjective functional status/changes:    No changes reported  Patient reports feeling a little better than last visit.    OBJECTIVE    Modality rationale: decrease pain and increase tissue extensibility to improve the patient???s ability to tolerate prolonged sitting, standing, walking   Min Type Additional Details     Estim: Att   Unatt        TENS instruct                  IFC  Premod   NMES                     Other:  w/US   w/ice   w/heat  Position:  Location:      Traction:  Cervical       Lumbar                        Prone          Supine                       Intermittent   Continuous Lbs:   before manual   after manual  w/heat      Ultrasound: Continuous    Pulsed at:                             Location:  W/cm2:     Paraffin         Location:   w/heat   15   Ice       Heat    Ice massage Position: supine  Location: low back      Laser    Other: Position:  Location:        Vasopneumatic Device Pressure:        lo  med  hi   Temperature:       Skin assessment post-treatment:  intact redness- no adverse reaction    redness ??? adverse reaction:     45 min Therapeutic Exercise:   See flow sheet :   Rationale: increase ROM, increase strength and improve coordination to improve the patient???s ability to tolerate prolonged sitting, standing, walking          With    TE    TA    neuro     other: Patient Education:  Review HEP     Progressed/Changed HEP based on:    positioning    body mechanics    transfers    heat/ice application     other:      Other Objective/Functional Measures: Patient tolerated today's new stretches today with a decrease in low back  pain following today's session.    Pain Level (0-10 scale) post treatment: 1    ASSESSMENT/Changes in Function:     Patient will continue to benefit from skilled PT services to modify and progress therapeutic interventions, address functional mobility deficits, address ROM deficits, address strength deficits, analyze and address soft tissue restrictions, analyze and cue movement patterns, analyze and modify body mechanics/ergonomics and assess and modify postural abnormalities to attain remaining goals.       See Plan of Care    See progress note/recertification    See Discharge Summary         Progress towards goals / Updated goals:  Patient tolerating intervention well; will continue to progress appropriately to address functional limitations.    PLAN    Upgrade activities as tolerated       Continue plan of care    Update interventions per flow sheet         Discharge due to:_    Other:_      Harlyn Rathmann [place] [facility], PTA, CPT  11/14/2014  1:50 PM

## 2014-11-19 ENCOUNTER — Inpatient Hospital Stay: Admit: 2014-11-19 | Payer: MEDICARE | Attending: Sports Medicine

## 2014-11-19 DIAGNOSIS — M545 Low back pain: Secondary | ICD-10-CM | POA: Diagnosis not present

## 2014-11-19 NOTE — Progress Notes (Signed)
PT DAILY TREATMENT NOTE - MCR 2-15    Patient Name: Cory Dalton  Date:11/19/2014  DOB: [birth year]    Patient DOB Verified  Payor: [place] MEDICARE / Plan: [place] MEDICARE PART A & B / Product Type: Medicare /    In time:8:30  Out time:9:30  Total Treatment Time (min): 60  Total Timed Codes (min): 50  1:1 Treatment Time (MC only): 40  Visit #: 5     Treatment Area: Low back pain [M54.5]    SUBJECTIVE  Pain Level (0-10 scale): 3  Any medication changes, allergies to medications, adverse drug reactions, diagnosis change, or new procedure performed?:  No     Yes (see summary sheet for update)  Subjective functional status/changes:    No changes reported  Pt. Stated that he felt ok after the last session. Also Pt. Noted that he completed his HEP this weekend. Pt. C/o some pain Monday and was only able to complete a few of his exercises.    OBJECTIVE    Modality rationale: decrease pain to improve the patient???s ability to improve standing and sitting tolerance, as well as functional mobility.   Min Type Additional Details     Estim: Att   Unatt        TENS instruct                  IFC  Premod   NMES                     Other:  w/US   w/ice   w/heat  Position:  Location:      Traction:  Cervical       Lumbar                        Prone          Supine                       Intermittent   Continuous Lbs:   before manual   after manual  w/heat      Ultrasound: Continuous    Pulsed at:                             Location:  W/cm2:     Paraffin         Location:   w/heat   10   Ice       Heat    Ice massage Position:Supine  Location:low back      Laser    Other: Position:  Location:        Vasopneumatic Device Pressure:        lo  med  hi   Temperature:       Skin assessment post-treatment:  intact redness- no adverse reaction    redness ??? adverse reaction:     50 min Therapeutic Exercise:   See flow sheet :   Rationale: increase ROM, increase strength and improve coordination to  improve the patient???s ability to perform ADL's pain free and improve functional mobility.              With    TE    TA    neuro    other: Patient Education:  Review HEP     Progressed/Changed HEP based on:    positioning    body mechanics  transfers    heat/ice application     other:      Other Objective/Functional Measures: Pt. Stands with a head forward position and walks with a narrow base of support.     Pain Level (0-10 scale) post treatment: 1    ASSESSMENT/Changes in Function:     Patient will continue to benefit from skilled PT services to modify and progress therapeutic interventions, address functional mobility deficits, address ROM deficits, address strength deficits, analyze and cue movement patterns and analyze and modify body mechanics/ergonomics to attain remaining goals.       See Plan of Care    See progress note/recertification    See Discharge Summary         Progress towards goals / Updated goals:  Pt is completing sit to stands w/o UE support with no c/o pain and is tolerating standing for 5 minutes.    PLAN    Upgrade activities as tolerated       Continue plan of care    Update interventions per flow sheet         Discharge due to:_    Other:_      Jethro BastosBryan I Skates, SPTA 11/19/2014  9:12 AM

## 2014-11-21 ENCOUNTER — Inpatient Hospital Stay: Admit: 2014-11-21 | Payer: MEDICARE | Attending: Sports Medicine

## 2014-11-21 DIAGNOSIS — M545 Low back pain: Secondary | ICD-10-CM | POA: Diagnosis not present

## 2014-11-21 NOTE — Progress Notes (Signed)
PT DAILY TREATMENT NOTE - MCR 2-15    Patient Name: Cory Dalton  Date:11/21/2014  DOB: [birth year]    Patient DOB Verified  [place]or: [place] MED[place] / Plan: [place] MED[place]ARE [place] A & B / Product Type: Medicare /    In time:8:35 am  Out time:9:45 am  Total Treatment Time (min): 75  Total Timed Codes (min): 60  1:1 Treatment Time (MC only): 60  Visit #: 5     Treatment Area: Low back pain [M54.5]    SUBJECTIVE  Pain Level (0-10 scale): 2  Any medication changes, allergies to medications, adverse drug reactions, diagnosis change, or new procedure performed?:  No     Yes (see summary sheet for update)  Subjective functional status/changes:    No changes reported  Pt. Stated that he felt ok after the last session. Also Pt. Noted that he completed his HEP this weekend. Pt. C/o some pain Monday and was only able to complete a few of his exercises.    OBJECTIVE    Modality rationale: decrease pain to improve the patient???s ability to improve standing and sitting tolerance, as well as functional mobility.   Min Type Additional Details     Estim: Att   Unatt        TENS instruct                  IFC  Premod   NMES                     Other:  w/US   w/ice   w/heat  Position:  Location:      Traction:  Cervical       Lumbar                        Prone          Supine                       Intermittent   Continuous Lbs:   before manual   after manual  w/heat      Ultrasound: Continuous    Pulsed at:                           1MHz   3MHz Location:  W/cm2:     Paraffin         Location:   w/heat   10   Ice       Heat    Ice massage Position:Supine  Location:low back      Laser    Other: Position:  Location:        Vasopneumatic Device Pressure:        lo  med  hi   Temperature:       Skin assessment post-treatment:  intact redness- no adverse reaction    redness ??? adverse reaction:     45 min Therapeutic Exercise:   See flow sheet :   Rationale: increase ROM, increase strength and improve coordination to  improve the patient???s ability to perform ADL's pain free and improve functional mobility.       15 min Manual Therapy: P/A mobs gd II/III to lumbar spine   Rationale: increase ROM, increase strength and improve coordination to improve the patient???s ability to perform ADL's pain free and improve functional mobility.          With  TE    TA    neuro    other: Patient Education:  Review HEP     Progressed/Changed HEP based on:    positioning    body mechanics    transfers    heat/ice application     other:      Other Objective/Functional Measures:   Hypomobile along lumbar spine  No radiating pain following today's treatment just some low back pain but better.    Pain Level (0-10 scale) post treatment: 1/10    ASSESSMENT/Changes in Function:     Patient will continue to benefit from skilled PT services to modify and progress therapeutic interventions, address functional mobility deficits, address ROM deficits, address strength deficits, analyze and cue movement patterns and analyze and modify body mechanics/ergonomics to attain remaining goals.       See Plan of Care    See progress note/recertification    See Discharge Summary         Progress towards goals / Updated goals:  Pt is progressing slowly towards goals with improvement in exercise tolerance but continued radicular symptoms present when walking or sitting.    PLAN    Upgrade activities as tolerated       Continue plan of care    Update interventions per flow sheet         Discharge due to:_    Other:_      Merlean Pizzini [place] [facility], PTA, CPT  11/21/2014  9:12 AM

## 2014-11-27 ENCOUNTER — Inpatient Hospital Stay: Admit: 2014-11-27 | Payer: MEDICARE | Attending: Sports Medicine

## 2014-11-27 DIAGNOSIS — M545 Low back pain: Secondary | ICD-10-CM

## 2014-11-27 NOTE — Progress Notes (Signed)
PT DAILY TREATMENT NOTE - MCR 2-15    Patient Name: Cory Dalton  Date:11/27/2014  DOB: 05-27-1926    Patient DOB Verified  Payor: [place] MEDICARE / Plan: [place] MEDICARE PART A & B / Product Type: Medicare /    In time:10:35  Out time:11:50  Total Treatment Time (min): 75  Total Timed Codes (min): 65  1:1 Treatment Time (MC only): 60  Visit #: 7     Treatment Area: Low back pain [M54.5]    SUBJECTIVE  Pain Level (0-10 scale): 1  Any medication changes, allergies to medications, adverse drug reactions, diagnosis change, or new procedure performed?:  No     Yes (see summary sheet for update)  Subjective functional status/changes:    No changes reported  Pt reports feeling good today, with some morning stiffness. Pt stated that he felt good after the last session and remained pain free for about a day. He also noted that Saturday and Sunday were"retty painless" as well.    OBJECTIVE    Modality rationale: decrease inflammation, decrease pain and increase tissue extensibility to improve the patient???s ability to perform ADL\'s, tolerate prolonged standing and ambulating, increas   Min Type Additional Details     Estim: Att   Unatt        TENS instruct                  IFC  Premod   NMES                     Other:  w/US   w/ice   w/heat  Position:  Location:      Traction:  Cervical       Lumbar                        Prone          Supine                       Intermittent   Continuous Lbs:   before manual   after manual  w/heat      Ultrasound: Continuous    Pulsed at:                           1MHz   3MHz Location:  W/cm2:     Paraffin         Location:   w/heat   10   Ice       Heat    Ice massage Position:supine  Location: lumbar      Laser    Other: Position:  Location:        Vasopneumatic Device Pressure:        lo  med  hi   Temperature:       Skin assessment post-treatment:  intact redness- no adverse reaction    redness ??? adverse reaction:     50  min Therapeutic Exercise:   See flow sheet :    Rationale: increase ROM, increase strength and improve coordination to improve the patient???s ability to perform ADL's, increase functional mobility, and improve standing and ambulating tolerance.    15 min Manual Therapy: Grade II/III P/A and rotational lumbar mobilizations    Rationale: decrease pain and increase ROM to improve the patient???s ability to perform ADL's and increase functional mobility.          With  TE    TA    neuro    other: Patient Education:  Review HEP     Progressed/Changed HEP based on:    positioning    body mechanics    transfers    heat/ice application     other:      Other Objective/Functional Measures: T11-L4 hypomobile with right QL and paraspinals hypertonic to palpation. Resisted side stepping and seated marching were added to exercise program.     Pain Level (0-10 scale) post treatment: 0    ASSESSMENT/Changes in Function:     Patient will continue to benefit from skilled PT services to modify and progress therapeutic interventions, address functional mobility deficits, address ROM deficits, address strength deficits, analyze and address soft tissue restrictions, analyze and cue movement patterns and assess and modify postural abnormalities to attain remaining goals.       See Plan of Care    See progress note/recertification    See Discharge Summary         Progress towards goals / Updated goals:  Pt is progressing well toward goals with no further complaints of pain increase. Pt tolerated manual treatment, resistance increase for clamshells, and new exercises well. Will progress exercises and resistance as tolerated.    PLAN    Upgrade activities as tolerated       Continue plan of care    Update interventions per flow sheet         Discharge due to:_    Other:_      Jethro Bastos, SPTA  11/27/2014  11:10 AM  TE 3 units  MT 1 unit

## 2014-11-29 ENCOUNTER — Other Ambulatory Visit: Admit: 2014-11-29 | Discharge: 2014-11-29 | Payer: MEDICARE

## 2014-11-29 ENCOUNTER — Inpatient Hospital Stay: Admit: 2014-11-29 | Payer: MEDICARE | Attending: Rehabilitative and Restorative Service Providers"

## 2014-11-29 DIAGNOSIS — I480 Paroxysmal atrial fibrillation: Secondary | ICD-10-CM

## 2014-11-29 DIAGNOSIS — M545 Low back pain: Secondary | ICD-10-CM | POA: Diagnosis not present

## 2014-11-29 LAB — AMB POC PT/INR
INR POC: 2.1
Prothrombin time (POC): 25 s

## 2014-11-29 NOTE — Progress Notes (Signed)
PT DAILY TREATMENT NOTE - MCR 2-15    Patient Name: Cory Dalton  Date:11/29/2014  DOB: Oct 27, 1926    Patient DOB Verified  Payor: [place] MEDICARE / Plan: [place] MEDICARE PART A & B / Product Type: Medicare /    In time:10:30 AM  Out time:11:40 AM  Total Treatment Time (min): 65  Total Timed Codes (min): 55  1:1 Treatment Time (MC only): 25  Visit #: 8    Treatment Area: Low back pain [M54.5]    SUBJECTIVE  Pain Level (0-10 scale): 0  Any medication changes, allergies to medications, adverse drug reactions, diagnosis change, or new procedure performed?:  No     Yes (see summary sheet for update)  Subjective functional status/changes:    No changes reported  Patient reports some stiffness and tightness at his low back, but not significant pain.    OBJECTIVE    Modality rationale: decrease inflammation, decrease pain and increase tissue extensibility to improve the patient???s ability to perform ADL's, tolerate prolonged standing and ambulating, increas   Min Type Additional Details     Estim: Att   Unatt        TENS instruct                  IFC  Premod   NMES                     Other:  w/US   w/ice   w/heat  Position:  Location:      Traction:  Cervical       Lumbar                        Prone          Supine                       Intermittent   Continuous Lbs:   before manual   after manual  w/heat      Ultrasound: Continuous    Pulsed at:                             Location:  W/cm2:     Paraffin         Location:   w/heat   10   Ice       Heat    Ice massage Position:supine  Location: lumbar      Laser    Other: Position:  Location:        Vasopneumatic Device Pressure:        lo  med  hi   Temperature:       Skin assessment post-treatment:  intact redness- no adverse reaction    redness ??? adverse reaction:     55 min Therapeutic Exercise:   See flow sheet :   Rationale: increase ROM, increase strength and improve coordination to improve the patient???s ability to perform ADL's, increase functional  mobility, and improve standing and ambulating tolerance.          With    TE    TA    neuro    other: Patient Education:  Review HEP     Progressed/Changed HEP based on:    positioning    body mechanics    transfers    heat/ice application     other:      Other Objective/Functional  Measures:     Pain Level (0-10 scale) post treatment: 0    ASSESSMENT/Changes in Function:     Patient will continue to benefit from skilled PT services to modify and progress therapeutic interventions, address functional mobility deficits, address ROM deficits, address strength deficits, analyze and address soft tissue restrictions, analyze and cue movement patterns and assess and modify postural abnormalities to attain remaining goals.       See Plan of Care    See progress note/recertification    See Discharge Summary         Progress towards goals / Updated goals:  Patient tolerated today's progression of therapeutic exercises very well with no pain reported throughout the session. He is doing very well with PT and will assess long term goals next visit.    PLAN    Upgrade activities as tolerated       Continue plan of care    Update interventions per flow sheet         Discharge due to:_    Other:_      Orvis BrillSamuel B Keyera Hattabaugh, PT  , DPT, OCS, Cert. DN    11/29/2014  11:10 AM

## 2014-12-02 ENCOUNTER — Encounter

## 2014-12-10 ENCOUNTER — Inpatient Hospital Stay: Admit: 2014-12-10 | Payer: MEDICARE | Attending: Sports Medicine

## 2014-12-10 DIAGNOSIS — M545 Low back pain: Secondary | ICD-10-CM | POA: Diagnosis not present

## 2014-12-10 NOTE — Progress Notes (Addendum)
PT DAILY TREATMENT NOTE - MCR 2-15    Patient Name: Cory Dalton  Date:12/10/2014  DOB: [birth year]    Patient DOB Verified  Payor: [place] MEDICARE / Plan: [place] MEDICARE PART A & B / Product Type: Medicare /    In time:10:35  Out time:11:25  Total Treatment Time (min): 50  Total Timed Codes (min): 50  1:1 Treatment Time (MC only): 30   Visit #: 9     Treatment Area: Low back pain [M54.5]    SUBJECTIVE  Pain Level (0-10 scale): 1  Any medication changes, allergies to medications, adverse drug reactions, diagnosis change, or new procedure performed?:  No     Yes (see summary sheet for update)  Subjective functional status/changes:    No changes reported  Pt reports feeling good with no c/o of pain. Pt also reports that he felt better after the last session with no increase of pain from new exercises.    OBJECTIVE       50 min Therapeutic Exercise:   See flow sheet :   Rationale: increase ROM, increase strength, improve coordination and improve balance to improve the patient???s ability to perform ADL's, tolerate prolonged sitting, standing, ambulation, and increase functional mobility.            With    TE    TA    neuro    other: Patient Education:  Review HEP     Progressed/Changed HEP based on:    positioning    body mechanics    transfers    heat/ice application     other:      Other Objective/Functional Measures: Pt required moderate VC's for correct form and application of PPT.      Pain Level (0-10 scale) post treatment: 0    ASSESSMENT/Changes in Function:     Patient will continue to benefit from skilled PT services to modify and progress therapeutic interventions, address functional mobility deficits, address ROM deficits, address strength deficits, analyze and cue movement patterns, analyze and modify body mechanics/ergonomics and assess and modify postural abnormalities to attain remaining goals.       See Plan of Care    See progress note/recertification    See Discharge Summary          Progress towards goals / Updated goals:  Pt is progressing well and is demonstrating compliance with HEP. Pt tolerated exercises well and declined moist hot pack today. Will continue to progress exercises as tolerated with focus on functional core stabilization and dynamic stability.    PLAN    Upgrade activities as tolerated       Continue plan of care    Update interventions per flow sheet         Discharge due to:_    Other:_      Jethro BastosBryan I Skates, SPT   12/10/2014  11:44 AM  TE 2 units

## 2014-12-12 ENCOUNTER — Inpatient Hospital Stay: Admit: 2014-12-12 | Payer: MEDICARE | Attending: Sports Medicine

## 2014-12-12 DIAGNOSIS — M545 Low back pain: Secondary | ICD-10-CM | POA: Diagnosis not present

## 2014-12-12 NOTE — Progress Notes (Signed)
[facility] [place], [place]  [facility], [place]  Phone: [phone] Fax: [phone]      Discharge Summary 2-15    Patient name: Cory Dalton  DOB: 12/10/1926  Provider#: [id number]  Referral source: Evalyn Casco, MD      Medical/Treatment Diagnosis: Low back pain [M54.5]     Prior Hospitalization: see medical history     Comorbidities: See Plan of Care  Prior Level of Function: See Plan of Care  Medications: Verified on Patient Summary List    Start of Care: 11/07/14      Onset Date:April 2016   Visits from Start of Care: 10     Missed Visits: 0  Reporting Period : 11/07/14 to 12/12/14    Assessment/Summary of care: Patient did very well with PT with a significant improvement in lumbar AROM and a full reduction in low back pain. On his last visit he had achieved all functional goals and he no longer required PT services.    Short Term Goals: To be accomplished in 8  treatments:  1. Patient will be able to perform sit-stand without use of UE support with <3/10 pain. Met.  2. Patient will be able to tolerate standing position for 15 minutes at a time with no increase in pain. Met.  3. Patient will be able to squat and lift 10# from floor level with <3/10 pain. Met.   ??  Long Term Goals: To be accomplished in 16  treatments:  1. Patient will be able to perform sit-stand without use of UE support and no increase in pain. Met.  2. Patient will be able to tolerate standing position for 30 minutes at a time without an increase in pain. Met.  3. Patient will be able to squat and lift 20# from floor level with no pain. Met.    G-Codes (GP)  Mobility   [id number] Goal  CJ= 20-39% [id number] D/C  CJ= 20-39%    The severity rating is based on clinical judgment and the FOTO Score score.    RECOMMENDATIONS:  Discontinue therapy: Patient has reached or is progressing toward set goals     Patient is non-compliant or has abdicated     Due to lack of appreciable progress towards set goals      Other  Sharol Roussel, PT , DPT, OCS, Cert. DN   03/27/2015 5:04 PM

## 2014-12-12 NOTE — Progress Notes (Signed)
PT DAILY TREATMENT NOTE - MCR 2-15    Patient Name: Cory Dalton  Date:12/12/2014  DOB: Apr 13, 1927    Patient DOB Verified  Payor: [place] MEDICARE / Plan: [place] MEDICARE PART A & B / Product Type: Medicare /    In time:10:30  Out time:11:35  Total Treatment Time (min): 65  Total Timed Codes (min): 65  1:1 Treatment Time ([place] only): 30   Visit #: 10     Treatment Area: Low back pain [M54.5]    SUBJECTIVE  Pain Level (0-10 scale): 0  Any medication changes, allergies to medications, adverse drug reactions, diagnosis change, or new procedure performed?:  No     Yes (see summary sheet for update)  Subjective functional status/changes:    No changes reported  Pt reports feeling good.    OBJECTIVE      65 min Therapeutic Exercise:   See flow sheet :   Rationale: increase ROM, increase strength, improve coordination and improve balance to improve the patient???s ability to perform ADL's, tolerate prolonged sitting, standing, transfers, adn ambulation pain free, and increase functional mobility.            With    TE    TA    neuro    other: Patient Education:  Review HEP     Progressed/Changed HEP based on:    positioning    body mechanics    transfers    heat/ice application     other:      Other Objective/Functional Measures: Pt demonstrated the ability to squat and lift a 10 lbs pain free as well as a 20 lbs box. Min VC's required for correct form and using PPT.     Pain Level (0-10 scale) post treatment: 0    ASSESSMENT/Changes in Function:          See Plan of Care    See progress note/recertification    See Discharge Summary         Progress towards goals / Updated goals:  Short Term Goals: To be accomplished in 8?? treatments:  1. Patient will be able to perform sit-stand without use of UE support with <3/10 pain. - met  2. Patient will be able to tolerate standing position for 15 minutes at a time with no increase in pain. - met  3. Patient will be able to squat and lift 10# from floor level with <3/10 pain. - met     Long Term Goals: To be accomplished in 16?? treatments:  1. Patient will be able to perform sit-stand without use of UE support and no increase in pain. - met  2. Patient will be able to tolerate standing position for 30 minutes at a time without an increase in pain. - in progress  3. Patient will be able to squat and lift 20# from floor level with no pain. - met    PLAN    Upgrade activities as tolerated       Continue plan of care    Update interventions per flow sheet         Discharge due to:_Goals met, if Pt does not make an appointment in 2 weeks.    Other:_      Trey Paula, SPTA  12/12/2014  1:53 PM  TE 2 units

## 2014-12-18 DIAGNOSIS — D485 Neoplasm of uncertain behavior of skin: Secondary | ICD-10-CM | POA: Diagnosis not present

## 2014-12-18 DIAGNOSIS — L570 Actinic keratosis: Secondary | ICD-10-CM | POA: Diagnosis not present

## 2014-12-18 DIAGNOSIS — L820 Inflamed seborrheic keratosis: Secondary | ICD-10-CM | POA: Diagnosis not present

## 2014-12-18 DIAGNOSIS — Z85828 Personal history of other malignant neoplasm of skin: Secondary | ICD-10-CM | POA: Diagnosis not present

## 2014-12-23 ENCOUNTER — Ambulatory Visit: Admit: 2014-12-23 | Discharge: 2014-12-23 | Payer: MEDICARE | Attending: Specialist

## 2014-12-23 DIAGNOSIS — E789 Disorder of lipoprotein metabolism, unspecified: Secondary | ICD-10-CM

## 2014-12-23 DIAGNOSIS — R0602 Shortness of breath: Secondary | ICD-10-CM | POA: Diagnosis not present

## 2014-12-23 DIAGNOSIS — I10 Essential (primary) hypertension: Secondary | ICD-10-CM | POA: Diagnosis not present

## 2014-12-23 DIAGNOSIS — I480 Paroxysmal atrial fibrillation: Secondary | ICD-10-CM | POA: Diagnosis not present

## 2014-12-23 NOTE — Progress Notes (Signed)
Cory Dalton is a 81 y.o. male whom I am seeing for HTN, dyslipidemia, AF. Last seen 6 months ago.    HISTORY OF PRESENTING ILLNESS     He is getting PT for his back pain with good results. The patient denies chest pain, shortness of breath, orthopnea, PND, LE edema, palpitations, syncope, presyncope or fatigue.       ACTIVE PROBLEM LIST     Patient Active Problem List    Diagnosis Date Noted   ??? Lumbar compression fracture ([place]) 06/14/2013   ??? 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 Lipid disorder 06/26/2012   ??? Abnormal CT of the abdomen 05/02/2012   ??? BPH (benign prostatic hyperplasia) 10/12/2011   ??? HTN (hypertension) 10/12/2011   ??? S/P colonoscopy 10/12/2011   ??? H/O knee surgery 10/12/2011   ??? Hernia 10/12/2011   ??? Paroxysmal atrial fibrillation (HCC) 07/04/2007   ??? BCC (basal cell carcinoma of skin) 07/04/2007   ??? Resting tremor 07/04/2007           PAST MEDICAL HISTORY     Past Medical History   Diagnosis Date   ??? Paroxysmal atrial fibrillation ([place]) 07/04/2007   ??? BPH 07/04/2007   ??? Resting tremor 07/04/2007   ??? BCC (basal cell carcinoma of skin) 07/04/2007   ??? Atrial fibrillation ([place])    ??? Skin cancer    ??? Hypertension            PAST SURGICAL HISTORY     Past Surgical History   Procedure Laterality Date   ??? Hx hernia repair  04/2010   ??? Hx cataract removal            ALLERGIES     No Known Allergies       FAMILY HISTORY     Family History   Problem Relation Age of Onset   ??? Hypertension Mother    ??? Cancer Father      ?prostate cancer, bone cancer           SOCIAL HISTORY     History     Social History   ??? Marital Status: MARRIED     Spouse Name: N/A   ??? Number of Children: N/A   ??? Years of Education: N/A     Social History Main Topics   ??? Smoking status: Light Tobacco Smoker     Types: Cigars   ??? Smokeless tobacco: Never Used      Comment: rare occasion   ??? Alcohol Use: 1.5 oz/week     3 Glasses of wine per week      Comment: 3 er week   ??? Drug Use: No   ??? Sexual Activity: Not on file      Other Topics Concern   ??? None     Social History Narrative         MEDICATIONS     Current Outpatient Prescriptions   Medication Sig   ??? tamsulosin (FLOMAX) 0.4 mg capsule Take 1 Cap by mouth daily.   ??? digoxin (DIGOX) 0.125 mg tablet Take 1 Tab by mouth daily.   ??? metoprolol tartrate (LOPRESSOR) 50 mg tablet Take 0.5 Tabs by mouth two (2) times a day.   ??? lovastatin (MEVACOR) 20 mg tablet TAKE 1 TABLET BY MOUTH EVERY EVENING   ??? dutasteride (AVODART) 0.5 mg capsule TAKE 1 CAPSULE DAILY   ??? triamterene-hydrochlorothiazide (MAXZIDE) 37.5-25 mg per tablet TAKE 1 TABLET BY MOUTH EVERY DAY   ???  docusate sodium (COLACE) 100 mg capsule Take 100 mg by mouth daily.   ??? amLODIPine (NORVASC) 5 mg tablet TAKE 1 TABLET BY MOUTH EVERY DAY   ??? warfarin (COUMADIN) 2.5 mg tablet Take 1 Tab by mouth daily.   ??? acetaminophen (TYLENOL) 500 mg tablet Take 1,000 mg by mouth every six (6) hours as needed for Pain.     No current facility-administered medications for this visit.       I have reviewed the nurses notes, vitals, problem list, allergy list, medical history, family, social history and medications.    I have reviewed patients medication list and have made no changes today.       REVIEW OF SYMPTOMS      General: Pt denies excessive weight gain or loss. Pt is able to conduct ADL's  HEENT: Denies blurred vision, headaches, hearing loss, epistaxis and difficulty swallowing.  Respiratory: Denies cough, congestion, shortness of breath, DOE, wheezing or stridor.  Cardiovascular: Denies precordial pain, palpitations, edema or PND  Gastrointestinal: Denies poor appetite, indigestion, abdominal pain or blood in stool  Genitourinary: Denies hematuria, dysuria, increased urinary frequency  Musculoskeletal: Denies joint pain or swelling from muscles or joints. Positive for back pain.  Neurologic: Denies tremor, paresthesias, headache, or sensory motor disturbance  Psychiatric: Denies confusion, insomnia, depression   Integumentray: Denies rash, itching or ulcers.  Hematologic: Denies easy bruising, bleeding     PHYSICAL EXAMINATION      Filed Vitals:    12/23/14 1108 12/23/14 1110   BP: 110/70 112/72   Pulse: 68    Height: '5\' 11"'  (1.803 m)    Weight: 183 lb (83.008 kg)      General: Well developed, in no acute distress.  HEENT: No jaundice, oral mucosa moist, no oral ulcers  Neck: Supple, no stiffness, no lymphadenopathy, supple  Heart: Irregularly irregular rhythm. no jugular venous distention  Respiratory: Clear bilaterally x 4, no wheezing or rales  Extremities: No edema, normal cap refill, no cyanosis.  Musculoskeletal: No clubbing, no deformities  Neuro: A&Ox3, speech clear, gait stable, cooperative, no focal neurologic deficits  Skin: Skin color is normal. No rashes or lesions. Non diaphoretic, moist.  Vascular: 2+ pulses symmetric in all extremities     DIAGNOSTIC DATA     1. Echo  04/13/07 - EF 55-60%, BAE, Mild TR   12/13 LVEF 65%, no WMA, mild to moderate RVH, BAE, atrial septum bow right to left, mild to moderate MR, severe TR, moderate to severe PA HTN  06/22/13 - LVEF 60%, no WMA, mild to moderate RVH, BAE, mild MR, trileaflet AV showing sclerosis without stenosis, moderate to severe TR    2. Cholesterol  05/01/13 - TC 146, HDL 80, LDL 58, TG 39     LABORATORY DATA     Lab Results   Component Value Date/Time    WBC 9.1 06/12/2013 07:42 PM    HEMOGLOBIN (POC) 14.2 05/04/2012 12:00 PM    HGB 13.6 06/12/2013 07:42 PM    HCT 39.4 06/12/2013 07:42 PM    PLATELET 187 06/12/2013 07:42 PM    MCV 93.6 06/12/2013 07:42 PM      Lab Results   Component Value Date/Time    SODIUM 137 06/12/2013 07:42 PM    POTASSIUM 3.9 06/12/2013 07:42 PM    CHLORIDE 101 06/12/2013 07:42 PM    CO2 25 06/12/2013 07:42 PM    ANION GAP 11 06/12/2013 07:42 PM    GLUCOSE 85 06/12/2013 07:42 PM    BUN  20 06/12/2013 07:42 PM    CREATININE 0.68 06/12/2013 07:42 PM    BUN/CREATININE RATIO 29 06/12/2013 07:42 PM    GFR EST AA >60 06/12/2013 07:42 PM     GFR EST NON-AA >60 06/12/2013 07:42 PM    CALCIUM 9.1 06/12/2013 07:42 PM    BILIRUBIN, TOTAL 0.7 06/12/2013 07:42 PM    ALT 22 06/12/2013 07:42 PM    AST 24 06/12/2013 07:42 PM    ALK. PHOSPHATASE 74 06/12/2013 07:42 PM    PROTEIN, TOTAL 6.9 06/12/2013 07:42 PM    ALBUMIN 3.3 06/12/2013 07:42 PM    GLOBULIN 3.6 06/12/2013 07:42 PM    A-G RATIO 0.9 06/12/2013 07:42 PM           ASSESSMENT/PLAN     1. Atrial fibrillation  ????????????????????????A. rate controlled  ????????????????????????B. Doing well without issues from anticoagulation  2. Edema possibly related to Norvasc   ????????????????????????A. Edema at baseline and today no edema noted  3. Dyslipidemia  ????????????????????????A. FLP near future   B. It has been since 2014 since last FLP  ??????????  4 Follow up in 6 months or prn     FOLLOW-UP     I have discussed the diagnosis with Josephine Igo and the intended plan as seen in the above orders. Questions were answered concerning future plans. I have discussed medication side effects and warnings with the patient as well.    Thank you, Raynelle Chary, MD for the opportunity to see this patient. Please do not hesitate to contact me for further questions/concerns.     Written by Maryclare Bean, [employer], as dictated by Vennie Homans, MD.    Vennie Homans, MD, [facility]  [facility] at [facility]  [address], [address] [place]. [address], [place] [address]

## 2014-12-24 MED ORDER — WARFARIN 2.5 MG TAB
2.5 mg | ORAL_TABLET | ORAL | Status: AC
Start: 2014-12-24 — End: ?

## 2014-12-25 DIAGNOSIS — I480 Paroxysmal atrial fibrillation: Secondary | ICD-10-CM | POA: Diagnosis not present

## 2014-12-25 DIAGNOSIS — R350 Frequency of micturition: Secondary | ICD-10-CM | POA: Diagnosis not present

## 2014-12-25 DIAGNOSIS — R351 Nocturia: Secondary | ICD-10-CM | POA: Diagnosis not present

## 2014-12-25 DIAGNOSIS — N401 Enlarged prostate with lower urinary tract symptoms: Secondary | ICD-10-CM | POA: Diagnosis not present

## 2014-12-25 DIAGNOSIS — E789 Disorder of lipoprotein metabolism, unspecified: Secondary | ICD-10-CM | POA: Diagnosis not present

## 2014-12-25 DIAGNOSIS — I10 Essential (primary) hypertension: Secondary | ICD-10-CM | POA: Diagnosis not present

## 2014-12-25 DIAGNOSIS — R0602 Shortness of breath: Secondary | ICD-10-CM | POA: Diagnosis not present

## 2014-12-26 LAB — HEPATIC FUNCTION PANEL
ALT (SGPT): 8 IU/L (ref 0–44)
AST (SGOT): 14 IU/L (ref 0–40)
Albumin: 3.9 g/dL (ref 3.5–4.7)
Alk. phosphatase: 73 IU/L (ref 39–117)
Bilirubin, direct: 0.2 mg/dL (ref 0.00–0.40)
Bilirubin, total: 0.6 mg/dL (ref 0.0–1.2)
Protein, total: 5.9 g/dL — ABNORMAL LOW (ref 6.0–8.5)

## 2014-12-26 LAB — LIPID PANEL
Cholesterol, total: 133 mg/dL (ref 100–199)
HDL Cholesterol: 53 mg/dL (ref >39)
LDL, calculated: 66 mg/dL (ref 0–99)
Triglyceride: 69 mg/dL (ref 0–149)
VLDL, calculated: 14 mg/dL (ref 5–40)

## 2014-12-27 ENCOUNTER — Other Ambulatory Visit: Admit: 2014-12-27 | Discharge: 2014-12-27 | Payer: MEDICARE

## 2014-12-27 DIAGNOSIS — I480 Paroxysmal atrial fibrillation: Secondary | ICD-10-CM

## 2014-12-27 LAB — AMB POC PT/INR
INR POC: 2.6
Prothrombin time (POC): 31.1 s

## 2014-12-27 NOTE — Progress Notes (Signed)
LABS DRAWN

## 2014-12-29 MED ORDER — LOVASTATIN 20 MG TAB
20 mg | ORAL_TABLET | ORAL | Status: AC
Start: 2014-12-29 — End: ?

## 2014-12-30 ENCOUNTER — Encounter

## 2015-01-07 MED ORDER — TRIAMTERENE-HYDROCHLOROTHIAZIDE 37.5 MG-25 MG TAB
ORAL_TABLET | ORAL | 0 refills | Status: DC
Start: 2015-01-07 — End: 2015-01-14

## 2015-01-14 ENCOUNTER — Encounter

## 2015-01-14 ENCOUNTER — Inpatient Hospital Stay: Admit: 2015-02-13 | Payer: MEDICARE

## 2015-01-14 ENCOUNTER — Ambulatory Visit: Admit: 2015-01-14 | Discharge: 2015-01-14 | Payer: MEDICARE

## 2015-01-14 DIAGNOSIS — I480 Paroxysmal atrial fibrillation: Secondary | ICD-10-CM

## 2015-01-14 DIAGNOSIS — Z111 Encounter for screening for respiratory tuberculosis: Secondary | ICD-10-CM

## 2015-01-14 DIAGNOSIS — I10 Essential (primary) hypertension: Secondary | ICD-10-CM | POA: Diagnosis not present

## 2015-01-14 DIAGNOSIS — Z23 Encounter for immunization: Secondary | ICD-10-CM | POA: Diagnosis not present

## 2015-01-14 MED ORDER — TRIAMTERENE-HYDROCHLOROTHIAZIDE 37.5 MG-25 MG TAB
ORAL_TABLET | ORAL | 3 refills | Status: DC
Start: 2015-01-14 — End: 2016-01-10

## 2015-01-14 NOTE — Progress Notes (Signed)
Cory Dalton is a 81 y.o.  male  Issues discussed today include:    Follow up of chronic conditions, see diagnoses    He is moving into assisted living in [place]. And is moving in 2 days      Data reviewed or ordered today:  Labs and forms for ALF    Other problems include:  Patient Active Problem List   Diagnosis Code   ??? Paroxysmal atrial fibrillation (HCC) I48.0   ??? BCC (basal cell carcinoma of skin) C44.91   ??? Resting tremor R25.9   ??? BPH (benign prostatic hyperplasia) N40.0   ??? HTN (hypertension) I10   ??? S/P colonoscopy Z98.89   ??? H/O knee surgery Z98.89   ??? Hernia K46.9   ??? Abnormal CT of the abdomen R93.5   ??? Lipid disorder E78.9   ??? Lumbar compression fracture (HCC) S32.000A   ??? Lumbar disc herniation with radiculopathy M51.16       Medications:  Current Outpatient Prescriptions   Medication Sig Dispense Refill   ??? lovastatin (MEVACOR) 20 mg tablet TAKE 1 TABLET BY MOUTH EVERY EVENING 90 Tab 1   ??? warfarin (COUMADIN) 2.5 mg tablet TAKE 1 TABLET BY MOUTH DAILY 90 Tab 0   ??? tamsulosin (FLOMAX) 0.4 mg capsule Take 1 Cap by mouth daily. 90 Cap 0   ??? digoxin (DIGOX) 0.125 mg tablet Take 1 Tab by mouth daily. 90 Tab 1   ??? metoprolol tartrate (LOPRESSOR) 50 mg tablet Take 0.5 Tabs by mouth two (2) times a day. 90 Tab 1   ??? dutasteride (AVODART) 0.5 mg capsule TAKE 1 CAPSULE DAILY 90 Cap 3   ??? docusate sodium (COLACE) 100 mg capsule Take 100 mg by mouth daily.     ??? amLODIPine (NORVASC) 5 mg tablet TAKE 1 TABLET BY MOUTH EVERY DAY 90 Tab 3   ??? acetaminophen (TYLENOL) 500 mg tablet Take 1,000 mg by mouth every six (6) hours as needed for Pain.     ??? triamterene-hydrochlorothiazide (MAXZIDE) 37.5-25 mg per tablet TAKE 1 TABLET BY MOUTH EVERY DAY 90 Tab 3       Allergies:  No Known Allergies    LMP:  No LMP for male patient.    Social History     Social History   ??? Marital status: MARRIED     Spouse name: N/A   ??? Number of children: N/A   ??? Years of education: N/A     Occupational History   ??? Not on file.      Social History Main Topics   ??? Smoking status: Light Tobacco Smoker     Types: Cigars   ??? Smokeless tobacco: Never Used      Comment: rare occasion   ??? Alcohol use 1.5 oz/week     3 Glasses of wine per week      Comment: 3 er week   ??? Drug use: No   ??? Sexual activity: Not on file     Other Topics Concern   ??? Not on file     Social History Narrative         Family History   Problem Relation Age of Onset   ??? Hypertension Mother    ??? Cancer Father      ?prostate cancer, bone cancer         Meaningful use:  Done    ROS:  The patient denies:  Headches/Chest Pain/SOB/fevers  Any positive ROS include: no falls    No  LMP for male patient.    Physical Exam  Visit Vitals   ??? BP 134/89 (BP 1 Location: Left arm, BP Patient Position: Sitting)   ??? Pulse 71   ??? Temp 98 ??F (36.7 ??C) (Oral)   ??? Resp 16   ??? Ht 5\' 11"  (1.803 m)   ??? Wt 183 lb (83 kg)   ??? SpO2 98%   ??? BMI 25.52 kg/m2     BP Readings from Last 3 Encounters:   01/14/15 134/89   12/23/14 112/72   10/08/14 106/67       Constitutional:  Appears well,  No Acute Distress, Vitals noted  Psychiatric:   Affect normal, Alert and Oriented to person/place/time    Eyes:   Pupils equally round and reactive, EOMI, conjunctiva clear, eyelids normal  ENT:   External ears and nose normal/lips, teeth=OK/gums normal, TMs and Oropharynx normal  Neck:   general inspection and Thyroid normal.  No abnormal cervical or supraclavicular nodes    Lungs:   clear to auscultation, good respiratory effort  Heart:   Auscultation normal.  Regular rhythm.  No cardiac murmurs.  No carotid bruits or palpable thrills  Chest wall normal    Abdominal exam:   normal.  Liver and spleen normal.  No bruits/masses/tenderness    Extremities:   without edema, good peripheral pulses  Skin:   Warm to palpation, without rashes, bruising, or suspicious lesions       Neuro:  No facial droop, speech fluent, EOMI, Pupils equally round and reactive to light, visual fields seem OK, hearing seems normal and  symmetrical,smile symmetrical, puffs out cheeks symmetrically    Shoulder shrug symmetrical     moves all extremities, strength/sensationseem intact and symmetrical    Rapid alternating movements of hands normal and symmetrical    balance seems OK, no pronator drift, gait normal. "get up and go" test OK    squats OK, heel standing/toe standing OK    no tenderness of C spine, T spine, LS spine, flexion/extension of spine OK    Affect seems appropriate, no obvious mental processing problems    MSK:  Full ROM all joints              Physical Exam:  Visit Vitals   ??? BP 134/89 (BP 1 Location: Left arm, BP Patient Position: Sitting)   ??? Pulse 71   ??? Temp 98 ??F (36.7 ??C) (Oral)   ??? Resp 16   ??? Ht 5\' 11"  (1.803 m)   ??? Wt 183 lb (83 kg)   ??? SpO2 98%   ??? BMI 25.52 kg/m2       BP Readings from Last 3 Encounters:   01/14/15 134/89   12/23/14 112/72   10/08/14 106/67         Assessment:    Patient Active Problem List   Diagnosis Code   ??? Paroxysmal atrial fibrillation (HCC) I48.0   ??? BCC (basal cell carcinoma of skin) C44.91   ??? Resting tremor R25.9   ??? BPH (benign prostatic hyperplasia) N40.0   ??? HTN (hypertension) I10   ??? S/P colonoscopy Z98.89   ??? H/O knee surgery Z98.89   ??? Hernia K46.9   ??? Abnormal CT of the abdomen R93.5   ??? Lipid disorder E78.9   ??? Lumbar compression fracture (HCC) S32.000A   ??? Lumbar disc herniation with radiculopathy M51.16       Today's diagnoses are:    ICD-10-CM ICD-9-CM    1. Screening-pulmonary TB  Z11.1 V74.1 QUANTIFERON TB GOLD   2. Paroxysmal atrial fibrillation (HCC) I48.0 427.31 PROTHROMBIN TIME + INR   3. Essential hypertension with goal blood pressure less than 140/90 I10 401.9 CBC W/O DIFF      METABOLIC PANEL, COMPREHENSIVE       Plan:  Orders Placed This Encounter   ??? QUANTIFERON TB GOLD   ??? PROTHROMBIN TIME + INR   ??? CBC W/O DIFF   ??? METABOLIC PANEL, COMPREHENSIVE       See patient instructions  Patient Instructions   forms completed        refresh note:  done    AVS Printed:  done       The majority of today's visit was counseling or coordination of care 25 minutes.     Total face to face time:  25  Time spent counseling:  20  Subject:  See diagnoses.     '

## 2015-01-14 NOTE — Patient Instructions (Addendum)
forms completed

## 2015-01-17 LAB — CBC W/O DIFF
HCT: 38.6 % (ref 37.5–51.0)
HGB: 12.7 g/dL (ref 12.6–17.7)
MCH: 31.5 pg (ref 26.6–33.0)
MCHC: 32.9 g/dL (ref 31.5–35.7)
MCV: 96 fL (ref 79–97)
PLATELET: 190 10*3/uL (ref 150–379)
RBC: 4.03 x10E6/uL — ABNORMAL LOW (ref 4.14–5.80)
RDW: 14.6 % (ref 12.3–15.4)
WBC: 7.7 10*3/uL (ref 3.4–10.8)

## 2015-01-17 LAB — QUANTIFERON TB GOLD

## 2015-01-17 LAB — PROTHROMBIN TIME + INR
INR: 1.6 — ABNORMAL HIGH (ref 0.8–1.2)
Prothrombin time: 16.9 s — ABNORMAL HIGH (ref 9.1–12.0)

## 2015-01-17 LAB — METABOLIC PANEL, COMPREHENSIVE
A-G Ratio: 1.9 (ref 1.1–2.5)
ALT (SGPT): 11 IU/L (ref 0–44)
AST (SGOT): 18 IU/L (ref 0–40)
Albumin: 4 g/dL (ref 3.5–4.7)
Alk. phosphatase: 92 IU/L (ref 39–117)
BUN/Creatinine ratio: 19 (ref 10–22)
BUN: 21 mg/dL (ref 8–27)
Bilirubin, total: 0.4 mg/dL (ref 0.0–1.2)
CO2: 25 mmol/L (ref 18–29)
Calcium: 9 mg/dL (ref 8.6–10.2)
Chloride: 100 mmol/L (ref 97–108)
Creatinine: 1.11 mg/dL (ref 0.76–1.27)
GFR est AA: 70 mL/min/{1.73_m2} (ref >59)
GFR est non-AA: 60 mL/min/{1.73_m2} (ref >59)
GLOBULIN, TOTAL: 2.1 g/dL (ref 1.5–4.5)
Glucose: 100 mg/dL — ABNORMAL HIGH (ref 65–99)
Potassium: 4.2 mmol/L (ref 3.5–5.2)
Protein, total: 6.1 g/dL (ref 6.0–8.5)
Sodium: 141 mmol/L (ref 134–144)

## 2015-01-17 LAB — QUANTIFERON IN TUBE REFL
QFT TB Ag minus Nil Value: 0 IU/mL
QuantiFERON Mitogen Value: 9.15 IU/mL
QuantiFERON Nil Value: 0.03 IU/mL
QuantiFERON TB Ag Value: 0.03 IU/mL
QuantiFERON TB Gold: NEGATIVE

## 2015-01-18 NOTE — Progress Notes (Signed)
Your TB test is normal.

## 2015-02-10 DIAGNOSIS — Z7901 Long term (current) use of anticoagulants: Secondary | ICD-10-CM | POA: Diagnosis not present

## 2015-02-10 DIAGNOSIS — I4891 Unspecified atrial fibrillation: Secondary | ICD-10-CM | POA: Diagnosis not present

## 2015-02-10 DIAGNOSIS — N400 Enlarged prostate without lower urinary tract symptoms: Secondary | ICD-10-CM | POA: Diagnosis not present

## 2015-02-10 DIAGNOSIS — Z79899 Other long term (current) drug therapy: Secondary | ICD-10-CM | POA: Diagnosis not present

## 2015-02-10 DIAGNOSIS — I10 Essential (primary) hypertension: Secondary | ICD-10-CM | POA: Diagnosis not present

## 2015-02-10 DIAGNOSIS — F17200 Nicotine dependence, unspecified, uncomplicated: Secondary | ICD-10-CM | POA: Diagnosis not present

## 2015-03-10 DIAGNOSIS — Z7901 Long term (current) use of anticoagulants: Secondary | ICD-10-CM | POA: Diagnosis not present

## 2015-03-18 DIAGNOSIS — Z7901 Long term (current) use of anticoagulants: Secondary | ICD-10-CM | POA: Diagnosis not present

## 2015-03-25 DIAGNOSIS — Z7901 Long term (current) use of anticoagulants: Secondary | ICD-10-CM | POA: Diagnosis not present

## 2015-04-10 DIAGNOSIS — Z7901 Long term (current) use of anticoagulants: Secondary | ICD-10-CM | POA: Diagnosis not present

## 2015-04-10 DIAGNOSIS — I4891 Unspecified atrial fibrillation: Secondary | ICD-10-CM | POA: Diagnosis not present

## 2015-04-25 DIAGNOSIS — Z7901 Long term (current) use of anticoagulants: Secondary | ICD-10-CM | POA: Diagnosis not present

## 2015-04-28 DIAGNOSIS — Z7901 Long term (current) use of anticoagulants: Secondary | ICD-10-CM | POA: Diagnosis not present

## 2015-05-02 DIAGNOSIS — Z7901 Long term (current) use of anticoagulants: Secondary | ICD-10-CM | POA: Diagnosis not present

## 2015-05-09 DIAGNOSIS — Z7901 Long term (current) use of anticoagulants: Secondary | ICD-10-CM | POA: Diagnosis not present

## 2015-05-09 DIAGNOSIS — I4891 Unspecified atrial fibrillation: Secondary | ICD-10-CM | POA: Diagnosis not present

## 2015-05-12 NOTE — Telephone Encounter (Signed)
Fax rec'd from [facility] for records as patient has transferred.  Faxed to [facility].    See previous patient my chart e-mail.

## 2015-05-28 DIAGNOSIS — Z7901 Long term (current) use of anticoagulants: Secondary | ICD-10-CM | POA: Diagnosis not present

## 2015-05-28 DIAGNOSIS — I4891 Unspecified atrial fibrillation: Secondary | ICD-10-CM | POA: Diagnosis not present

## 2015-06-04 ENCOUNTER — Encounter

## 2015-06-05 MED ORDER — METOPROLOL TARTRATE 50 MG TAB
50 mg | ORAL_TABLET | ORAL | 0 refills | Status: DC
Start: 2015-06-05 — End: 2016-01-25

## 2015-06-17 ENCOUNTER — Ambulatory Visit (INDEPENDENT_AMBULATORY_CARE_PROVIDER_SITE_OTHER): Payer: Medicare Other | Admitting: Cardiology

## 2015-06-17 ENCOUNTER — Encounter: Payer: Self-pay | Admitting: Cardiology

## 2015-06-17 VITALS — BP 130/80 | HR 82 | Ht 70.0 in | Wt 191.2 lb

## 2015-06-17 DIAGNOSIS — I482 Chronic atrial fibrillation, unspecified: Secondary | ICD-10-CM | POA: Insufficient documentation

## 2015-06-17 NOTE — Progress Notes (Signed)
Cardiology Office Note   Date:  06/17/2015   ID:  Adam Richards, DOB 12-Jul-1926, MRN [id number]  PCP:  Adam Levels, NP  Cardiologist:   Minus Breeding, MD   Chief Complaint  Patient presents with  . New Evaluation    ESTABLISH CARE,       History of Present Illness: Adam Richards is a 82 y.o. male who presents for evaluation of atrial fibrillation. He is moving here from [place]. He reports a long-standing history of permanent atrial fibrillation. He's been on anticoagulation for years. He otherwise does very well. He lives in independent living at a retirement community. He likes to walk. He does a little low impact exercise. With all of this he denies any cardiovascular symptoms. The patient denies any new symptoms such as chest discomfort, neck or arm discomfort. There has been no new shortness of breath, PND or orthopnea. There have been no reported palpitations, presyncope or syncope.   Past Medical History  Diagnosis Date  . A-fib ([place])   . Hypertension     No past surgical history on file.   Current Outpatient Prescriptions  Medication Sig Dispense Refill  . amLODipine (NORVASC) 5 MG tablet Take 1 tablet by mouth daily.  0  . bisacodyl (DULCOLAX) 5 MG EC tablet Take 5 mg by mouth daily as needed for moderate constipation.    Marland Kitchen DIGOX 125 MCG tablet Take 1 tablet by mouth daily.  1  . docusate calcium (SURFAK) 240 MG capsule Take 240 mg by mouth daily.    Marland Kitchen dutasteride (AVODART) 0.5 MG capsule Take 1 capsule by mouth daily.  2  . lovastatin (MEVACOR) 20 MG tablet Take 20 mg by mouth daily.  1  . metoprolol (LOPRESSOR) 50 MG tablet Take 25 mg by mouth 2 (two) times daily.    . tamsulosin (FLOMAX) 0.4 MG CAPS capsule Take 1 capsule by mouth daily.    Marland Kitchen triamterene-hydrochlorothiazide (MAXZIDE-25) 37.5-25 MG tablet Take 1 tablet by mouth daily.  2  . warfarin (COUMADIN) 2.5 MG tablet Take as directed     No current facility-administered medications for this visit.     Allergies:   Review of patient's allergies indicates no known allergies.    Social History:  The patient  reports that he has been smoking.  He does not have any smokeless tobacco history on file.   Family History:  The patient's family history includes Cancer in his brother and father; Heart failure in his mother; Prostate cancer in his father; Stroke in his maternal grandmother; Tuberculosis in his maternal grandfather.    ROS:  Please see the history of present illness.   Otherwise, review of systems are positive for none.   All other systems are reviewed and negative.    PHYSICAL EXAM: VS:  BP 130/80 mmHg  Pulse 82  Ht 5\' 10"  (1.778 m)  Wt 191 lb 3.2 oz (86.728 kg)  BMI 27.43 kg/m2 , BMI Body mass index is 27.43 kg/(m^2). GENERAL:  Well appearing HEENT:  Pupils equal round and reactive, fundi not visualized, oral mucosa unremarkable NECK:  No jugular venous distention, waveform within normal limits, carotid upstroke brisk and symmetric, no bruits, no thyromegaly LYMPHATICS:  No cervical, inguinal adenopathy LUNGS:  Clear to auscultation bilaterally BACK:  No CVA tenderness CHEST:  Unremarkable HEART:  PMI not displaced or sustained,S1 and S2 within normal limits, no S3,  no clicks, no rubs, no murmurs, irregular ABD:  Flat, positive bowel sounds normal in frequency  in pitch, no bruits, no rebound, no guarding, no midline pulsatile mass, no hepatomegaly, no splenomegaly EXT:  2 plus pulses throughout, no edema, no cyanosis no clubbing SKIN:  No rashes no nodules NEURO:  Cranial nerves II through XII grossly intact, motor grossly intact throughout PSYCH:  Cognitively intact, oriented to person place and time    EKG:  EKG is ordered today. The ekg ordered today demonstrates atrial fibrillation, rate 68, premature ventricular contractions, poor anterior R-wave progression, low voltage in leads, intervals within normal limits, no acute ST-T wave changes.   Recent Labs: No  results found for requested labs within last 365 days.    Lipid Panel No results found for: CHOL, TRIG, HDL, CHOLHDL, VLDL, LDLCALC, LDLDIRECT    Wt Readings from Last 3 Encounters:  06/17/15 191 lb 3.2 oz (86.728 kg)      Other studies Reviewed: Additional studies/ records that were reviewed today include: None. Review of the above records demonstrates:  Please see elsewhere in the note.     ASSESSMENT AND PLAN:  ATRIAL FIB:  The patient has chronic atrial fibrillation. If I list hypertension as a risk factor Mr. Tristain Gottshall has a CHA2DS2 - VASc score of 3 with a risk of stroke of 3.2%.  He tolerates anticoagulation and there is no reason to change. He seems to have good rate control. He has no other cardiovascular complaints.  His INR is followed by Adam Levels, NP  HTN:  The medications above are for rate control but I suspect his blood pressure would be slightly elevated without them. As it is is well controlled. No change in therapy is indicated.  Current medicines are reviewed at length with the patient today.  The patient does not have concerns regarding medicines.  The following changes have been made:  no change  Labs/ tests ordered today include: None  No orders of the defined types were placed in this encounter.     Disposition:   FU with me in one year    Signed, Minus Breeding, MD  06/17/2015 4:35 PM     [facility]

## 2015-06-17 NOTE — Patient Instructions (Signed)
Dr Hochrein recommends that you schedule a follow-up appointment in 1 year. You will receive a reminder letter in the mail two months in advance. If you don't receive a letter, please call our office to schedule the follow-up appointment.  If you need a refill on your cardiac medications before your next appointment, please call your pharmacy. 

## 2015-06-24 ENCOUNTER — Telehealth: Payer: Self-pay

## 2015-06-24 NOTE — Telephone Encounter (Signed)
Records Release faxed to Dr Yolanda Bonine in [place]

## 2015-06-30 DIAGNOSIS — I4891 Unspecified atrial fibrillation: Secondary | ICD-10-CM | POA: Diagnosis not present

## 2015-06-30 DIAGNOSIS — Z7901 Long term (current) use of anticoagulants: Secondary | ICD-10-CM | POA: Diagnosis not present

## 2015-07-04 ENCOUNTER — Encounter: Attending: Specialist

## 2015-07-06 MED ORDER — AMLODIPINE 5 MG TAB
5 mg | ORAL_TABLET | ORAL | 3 refills | Status: AC
Start: 2015-07-06 — End: ?

## 2015-07-11 ENCOUNTER — Telehealth: Payer: Self-pay

## 2015-07-11 NOTE — Telephone Encounter (Signed)
Faxed signed release to Dr Vennie Homans - to obtain records per Dr Hochrein's request. Faxed to Dr Mort Sawyers on 07/11/2015.

## 2015-07-28 DIAGNOSIS — I4891 Unspecified atrial fibrillation: Secondary | ICD-10-CM | POA: Diagnosis not present

## 2015-07-28 DIAGNOSIS — Z7901 Long term (current) use of anticoagulants: Secondary | ICD-10-CM | POA: Diagnosis not present

## 2015-08-04 DIAGNOSIS — Z7901 Long term (current) use of anticoagulants: Secondary | ICD-10-CM | POA: Diagnosis not present

## 2015-08-11 ENCOUNTER — Telehealth: Payer: Self-pay | Admitting: Cardiology

## 2015-08-11 NOTE — Telephone Encounter (Signed)
Received records from Dr Vennie Homans ([facility]) as requested by Dr Percival Spanish.  Records given to Dr Percival Spanish for review. lp

## 2015-08-18 DIAGNOSIS — Z7901 Long term (current) use of anticoagulants: Secondary | ICD-10-CM | POA: Diagnosis not present

## 2015-08-18 DIAGNOSIS — I4891 Unspecified atrial fibrillation: Secondary | ICD-10-CM | POA: Diagnosis not present

## 2015-08-26 DIAGNOSIS — Z0000 Encounter for general adult medical examination without abnormal findings: Secondary | ICD-10-CM | POA: Diagnosis not present

## 2015-09-01 DIAGNOSIS — I4891 Unspecified atrial fibrillation: Secondary | ICD-10-CM | POA: Diagnosis not present

## 2015-09-01 DIAGNOSIS — Z7901 Long term (current) use of anticoagulants: Secondary | ICD-10-CM | POA: Diagnosis not present

## 2015-09-01 DIAGNOSIS — Z79899 Other long term (current) drug therapy: Secondary | ICD-10-CM | POA: Diagnosis not present

## 2015-09-01 DIAGNOSIS — E785 Hyperlipidemia, unspecified: Secondary | ICD-10-CM | POA: Diagnosis not present

## 2015-09-04 DIAGNOSIS — I8310 Varicose veins of unspecified lower extremity with inflammation: Secondary | ICD-10-CM | POA: Diagnosis not present

## 2015-09-04 DIAGNOSIS — L570 Actinic keratosis: Secondary | ICD-10-CM | POA: Diagnosis not present

## 2015-09-04 DIAGNOSIS — L814 Other melanin hyperpigmentation: Secondary | ICD-10-CM | POA: Diagnosis not present

## 2015-09-23 DIAGNOSIS — Z7901 Long term (current) use of anticoagulants: Secondary | ICD-10-CM | POA: Diagnosis not present

## 2015-09-30 DIAGNOSIS — Z7901 Long term (current) use of anticoagulants: Secondary | ICD-10-CM | POA: Diagnosis not present

## 2015-10-23 DIAGNOSIS — Z7901 Long term (current) use of anticoagulants: Secondary | ICD-10-CM | POA: Diagnosis not present

## 2015-11-18 DIAGNOSIS — Z7901 Long term (current) use of anticoagulants: Secondary | ICD-10-CM | POA: Diagnosis not present

## 2015-12-17 DIAGNOSIS — I4891 Unspecified atrial fibrillation: Secondary | ICD-10-CM | POA: Diagnosis not present

## 2015-12-17 DIAGNOSIS — Z7901 Long term (current) use of anticoagulants: Secondary | ICD-10-CM | POA: Diagnosis not present

## 2015-12-26 DIAGNOSIS — Z7901 Long term (current) use of anticoagulants: Secondary | ICD-10-CM | POA: Diagnosis not present

## 2015-12-26 DIAGNOSIS — I4891 Unspecified atrial fibrillation: Secondary | ICD-10-CM | POA: Diagnosis not present

## 2015-12-31 DIAGNOSIS — I4891 Unspecified atrial fibrillation: Secondary | ICD-10-CM | POA: Diagnosis not present

## 2015-12-31 DIAGNOSIS — Z7901 Long term (current) use of anticoagulants: Secondary | ICD-10-CM | POA: Diagnosis not present

## 2016-01-06 DIAGNOSIS — L814 Other melanin hyperpigmentation: Secondary | ICD-10-CM | POA: Diagnosis not present

## 2016-01-06 DIAGNOSIS — Z85828 Personal history of other malignant neoplasm of skin: Secondary | ICD-10-CM | POA: Diagnosis not present

## 2016-01-06 DIAGNOSIS — D1801 Hemangioma of skin and subcutaneous tissue: Secondary | ICD-10-CM | POA: Diagnosis not present

## 2016-01-06 DIAGNOSIS — D171 Benign lipomatous neoplasm of skin and subcutaneous tissue of trunk: Secondary | ICD-10-CM | POA: Diagnosis not present

## 2016-01-06 DIAGNOSIS — L570 Actinic keratosis: Secondary | ICD-10-CM | POA: Diagnosis not present

## 2016-01-06 DIAGNOSIS — L821 Other seborrheic keratosis: Secondary | ICD-10-CM | POA: Diagnosis not present

## 2016-01-09 DIAGNOSIS — Z7901 Long term (current) use of anticoagulants: Secondary | ICD-10-CM | POA: Diagnosis not present

## 2016-01-09 DIAGNOSIS — I4891 Unspecified atrial fibrillation: Secondary | ICD-10-CM | POA: Diagnosis not present

## 2016-01-12 MED ORDER — TRIAMTERENE-HYDROCHLOROTHIAZIDE 37.5 MG-25 MG TAB
ORAL_TABLET | ORAL | 0 refills | Status: AC
Start: 2016-01-12 — End: ?

## 2016-01-23 DIAGNOSIS — Z7901 Long term (current) use of anticoagulants: Secondary | ICD-10-CM | POA: Diagnosis not present

## 2016-01-23 DIAGNOSIS — I4891 Unspecified atrial fibrillation: Secondary | ICD-10-CM | POA: Diagnosis not present

## 2016-01-25 MED ORDER — METOPROLOL TARTRATE 50 MG TAB
50 mg | ORAL_TABLET | ORAL | 0 refills | Status: AC
Start: 2016-01-25 — End: ?

## 2016-02-16 DIAGNOSIS — Z7901 Long term (current) use of anticoagulants: Secondary | ICD-10-CM | POA: Diagnosis not present

## 2016-02-16 DIAGNOSIS — I4891 Unspecified atrial fibrillation: Secondary | ICD-10-CM | POA: Diagnosis not present

## 2016-02-24 DIAGNOSIS — Z23 Encounter for immunization: Secondary | ICD-10-CM | POA: Diagnosis not present

## 2016-03-15 DIAGNOSIS — Z7901 Long term (current) use of anticoagulants: Secondary | ICD-10-CM | POA: Diagnosis not present

## 2016-03-15 DIAGNOSIS — I4891 Unspecified atrial fibrillation: Secondary | ICD-10-CM | POA: Diagnosis not present

## 2016-04-13 DIAGNOSIS — Z7901 Long term (current) use of anticoagulants: Secondary | ICD-10-CM | POA: Diagnosis not present

## 2016-04-13 DIAGNOSIS — I4891 Unspecified atrial fibrillation: Secondary | ICD-10-CM | POA: Diagnosis not present

## 2016-05-06 DIAGNOSIS — L570 Actinic keratosis: Secondary | ICD-10-CM | POA: Diagnosis not present

## 2016-05-06 DIAGNOSIS — C4440 Unspecified malignant neoplasm of skin of scalp and neck: Secondary | ICD-10-CM | POA: Diagnosis not present

## 2016-05-06 DIAGNOSIS — D485 Neoplasm of uncertain behavior of skin: Secondary | ICD-10-CM | POA: Diagnosis not present

## 2016-05-06 DIAGNOSIS — D1801 Hemangioma of skin and subcutaneous tissue: Secondary | ICD-10-CM | POA: Diagnosis not present

## 2016-05-06 DIAGNOSIS — L821 Other seborrheic keratosis: Secondary | ICD-10-CM | POA: Diagnosis not present

## 2016-05-06 DIAGNOSIS — C4442 Squamous cell carcinoma of skin of scalp and neck: Secondary | ICD-10-CM | POA: Diagnosis not present

## 2016-05-06 DIAGNOSIS — L814 Other melanin hyperpigmentation: Secondary | ICD-10-CM | POA: Diagnosis not present

## 2016-05-06 DIAGNOSIS — C44329 Squamous cell carcinoma of skin of other parts of face: Secondary | ICD-10-CM | POA: Diagnosis not present

## 2016-05-10 DIAGNOSIS — I4891 Unspecified atrial fibrillation: Secondary | ICD-10-CM | POA: Diagnosis not present

## 2016-05-10 DIAGNOSIS — Z7901 Long term (current) use of anticoagulants: Secondary | ICD-10-CM | POA: Diagnosis not present

## 2016-05-10 DIAGNOSIS — N400 Enlarged prostate without lower urinary tract symptoms: Secondary | ICD-10-CM | POA: Diagnosis not present

## 2016-05-10 DIAGNOSIS — D649 Anemia, unspecified: Secondary | ICD-10-CM | POA: Diagnosis not present

## 2016-05-10 DIAGNOSIS — Z79899 Other long term (current) drug therapy: Secondary | ICD-10-CM | POA: Diagnosis not present

## 2016-05-10 DIAGNOSIS — Z23 Encounter for immunization: Secondary | ICD-10-CM | POA: Diagnosis not present

## 2016-05-10 DIAGNOSIS — I10 Essential (primary) hypertension: Secondary | ICD-10-CM | POA: Diagnosis not present

## 2016-06-14 DIAGNOSIS — I4891 Unspecified atrial fibrillation: Secondary | ICD-10-CM | POA: Diagnosis not present

## 2016-06-14 DIAGNOSIS — Z7901 Long term (current) use of anticoagulants: Secondary | ICD-10-CM | POA: Diagnosis not present

## 2016-06-16 DIAGNOSIS — C4442 Squamous cell carcinoma of skin of scalp and neck: Secondary | ICD-10-CM | POA: Diagnosis not present

## 2016-06-17 NOTE — Progress Notes (Signed)
Cardiology Office Note   Date:  06/19/2016   ID:  Adam Richards, DOB 1926-12-26, MRN [id number]  PCP:  Eloise Levels, NP  Cardiologist:   Minus Breeding, MD   Chief Complaint  Patient presents with  . Atrial Fibrillation      History of Present Illness: Adam Richards is a 83 y.o. male who presents for evaluation of atrial fibrillation. He moved here from [place]. He has a long-standing history of permanent atrial fibrillation. He's been on anticoagulation for years. Since I last saw him he has done well.  The patient denies any new symptoms such as chest discomfort, neck or arm discomfort. There has been no new shortness of breath, PND or orthopnea. There have been no reported palpitations, presyncope or syncope.   Past Medical History:  Diagnosis Date  . A-fib ([place])   . Hypertension     Past Surgical History:  Procedure Laterality Date  . KNEE SURGERY     "removed crystals"  . TONSILLECTOMY AND ADENOIDECTOMY    . UMBILICAL HERNIA REPAIR       Current Outpatient Prescriptions  Medication Sig Dispense Refill  . amLODipine (NORVASC) 5 MG tablet Take 1 tablet by mouth daily.  0  . DIGOX 125 MCG tablet Take 1 tablet by mouth daily.  1  . docusate calcium (SURFAK) 240 MG capsule Take 240 mg by mouth daily.    Marland Kitchen dutasteride (AVODART) 0.5 MG capsule Take 1 capsule by mouth daily.  2  . lovastatin (MEVACOR) 20 MG tablet Take 20 mg by mouth daily.  1  . metoprolol tartrate (LOPRESSOR) 25 MG tablet Take 1 tablet by mouth 2 (two) times daily.    . tamsulosin (FLOMAX) 0.4 MG CAPS capsule Take 1 capsule by mouth daily.    Marland Kitchen triamterene-hydrochlorothiazide (MAXZIDE-25) 37.5-25 MG tablet Take 1 tablet by mouth daily.  2  . warfarin (COUMADIN) 2.5 MG tablet Take as directed     No current facility-administered medications for this visit.     Allergies:   Patient has no known allergies.    ROS:  Please see the history of present illness.   Otherwise, review of systems are  positive for none.   All other systems are reviewed and negative.    PHYSICAL EXAM: VS:  BP 136/78 (BP Location: Right Arm, Patient Position: Sitting, Cuff Size: Normal)   Pulse 72   Ht 5\' 10"  (1.778 m)   Wt 195 lb (88.5 kg)   BMI 27.98 kg/m  , BMI Body mass index is 27.98 kg/m. GENERAL:  Well appearing NECK:  No jugular venous distention, waveform within normal limits, carotid upstroke brisk and symmetric, no bruits, no thyromegaly LUNGS:  Clear to auscultation bilaterally HEART:  PMI not displaced or sustained,S1 and S2 within normal limits, no S3,  no clicks, no rubs, no murmurs, irregular ABD:  Flat, positive bowel sounds normal in frequency in pitch, no bruits, no rebound, no guarding, no midline pulsatile mass, no hepatomegaly, no splenomegaly EXT:  2 plus pulses throughout, no edema, no cyanosis no clubbing   EKG:  EKG is  ordered today. The ekg ordered today demonstrates atrial fibrillation, rate 72, premature ventricular contractions, poor anterior R-wave progression, low voltage in leads, intervals within normal limits, no acute ST-T wave changes.   Recent Labs: No results found for requested labs within last 8760 hours.    Lipid Panel No results found for: CHOL, TRIG, HDL, CHOLHDL, VLDL, LDLCALC, LDLDIRECT    Wt Readings from Last  3 Encounters:  06/18/16 195 lb (88.5 kg)  06/17/15 191 lb 3.2 oz (86.7 kg)      Other studies Reviewed: Additional studies/ records that were reviewed today include: None. Review of the above records demonstrates:     ASSESSMENT AND PLAN:  ATRIAL FIB:  The patient has chronic atrial fibrillation. [name] has a CHA2DS2 - VASc score of 3 with a risk of stroke of 3.2%.  He tolerates anticoagulation and there is no reason to change. He seems to have good rate control. He has no other cardiovascular complaints.  His INR is followed by Eloise Levels, NP   we talked about making sure he gets his hemoglobin checked a couple of times  per year and I will defer to his primary provider.  HTN:  The blood pressure is at target. No change in medications is indicated. We will continue with therapeutic lifestyle changes (TLC).  Current medicines are reviewed at length with the patient today.  The patient does not have concerns regarding medicines.  The following changes have been made:  None  Labs/ tests ordered today include:   Orders Placed This Encounter  Procedures  . EKG 12-Lead     Disposition:   FU with me in one year.   Signed, Minus Breeding, MD  06/19/2016 3:16 PM    [place]

## 2016-06-18 ENCOUNTER — Ambulatory Visit (INDEPENDENT_AMBULATORY_CARE_PROVIDER_SITE_OTHER): Payer: Medicare Other | Admitting: Cardiology

## 2016-06-18 VITALS — BP 136/78 | HR 72 | Ht 70.0 in | Wt 195.0 lb

## 2016-06-18 DIAGNOSIS — I482 Chronic atrial fibrillation, unspecified: Secondary | ICD-10-CM

## 2016-06-18 NOTE — Patient Instructions (Signed)

## 2016-06-19 ENCOUNTER — Encounter: Payer: Self-pay | Admitting: Cardiology

## 2016-06-23 DIAGNOSIS — D044 Carcinoma in situ of skin of scalp and neck: Secondary | ICD-10-CM | POA: Diagnosis not present

## 2016-07-23 DIAGNOSIS — Z7901 Long term (current) use of anticoagulants: Secondary | ICD-10-CM | POA: Diagnosis not present

## 2016-07-23 DIAGNOSIS — I4891 Unspecified atrial fibrillation: Secondary | ICD-10-CM | POA: Diagnosis not present

## 2016-08-26 DIAGNOSIS — E785 Hyperlipidemia, unspecified: Secondary | ICD-10-CM | POA: Diagnosis not present

## 2016-08-26 DIAGNOSIS — I4891 Unspecified atrial fibrillation: Secondary | ICD-10-CM | POA: Diagnosis not present

## 2016-08-26 DIAGNOSIS — I10 Essential (primary) hypertension: Secondary | ICD-10-CM | POA: Diagnosis not present

## 2016-08-26 DIAGNOSIS — Z7901 Long term (current) use of anticoagulants: Secondary | ICD-10-CM | POA: Diagnosis not present

## 2016-08-26 DIAGNOSIS — Z79899 Other long term (current) drug therapy: Secondary | ICD-10-CM | POA: Diagnosis not present

## 2016-08-26 DIAGNOSIS — Z0000 Encounter for general adult medical examination without abnormal findings: Secondary | ICD-10-CM | POA: Diagnosis not present

## 2016-09-16 DIAGNOSIS — L570 Actinic keratosis: Secondary | ICD-10-CM | POA: Diagnosis not present

## 2016-09-16 DIAGNOSIS — L814 Other melanin hyperpigmentation: Secondary | ICD-10-CM | POA: Diagnosis not present

## 2016-09-16 DIAGNOSIS — L821 Other seborrheic keratosis: Secondary | ICD-10-CM | POA: Diagnosis not present

## 2016-09-16 DIAGNOSIS — Z85828 Personal history of other malignant neoplasm of skin: Secondary | ICD-10-CM | POA: Diagnosis not present

## 2016-09-16 DIAGNOSIS — D485 Neoplasm of uncertain behavior of skin: Secondary | ICD-10-CM | POA: Diagnosis not present

## 2016-09-28 DIAGNOSIS — Z7901 Long term (current) use of anticoagulants: Secondary | ICD-10-CM | POA: Diagnosis not present

## 2016-09-28 DIAGNOSIS — I4891 Unspecified atrial fibrillation: Secondary | ICD-10-CM | POA: Diagnosis not present

## 2016-10-20 DIAGNOSIS — C4442 Squamous cell carcinoma of skin of scalp and neck: Secondary | ICD-10-CM | POA: Diagnosis not present

## 2016-10-20 DIAGNOSIS — L570 Actinic keratosis: Secondary | ICD-10-CM | POA: Diagnosis not present

## 2016-11-09 DIAGNOSIS — I10 Essential (primary) hypertension: Secondary | ICD-10-CM | POA: Diagnosis not present

## 2016-11-09 DIAGNOSIS — E7800 Pure hypercholesterolemia, unspecified: Secondary | ICD-10-CM | POA: Diagnosis not present

## 2016-11-09 DIAGNOSIS — R600 Localized edema: Secondary | ICD-10-CM | POA: Diagnosis not present

## 2016-11-09 DIAGNOSIS — Z7901 Long term (current) use of anticoagulants: Secondary | ICD-10-CM | POA: Diagnosis not present

## 2016-11-09 DIAGNOSIS — I4891 Unspecified atrial fibrillation: Secondary | ICD-10-CM | POA: Diagnosis not present

## 2016-11-11 DIAGNOSIS — Z7901 Long term (current) use of anticoagulants: Secondary | ICD-10-CM | POA: Diagnosis not present

## 2016-11-18 DIAGNOSIS — Z7901 Long term (current) use of anticoagulants: Secondary | ICD-10-CM | POA: Diagnosis not present

## 2016-12-16 DIAGNOSIS — Z7901 Long term (current) use of anticoagulants: Secondary | ICD-10-CM | POA: Diagnosis not present

## 2017-01-13 DIAGNOSIS — Z7901 Long term (current) use of anticoagulants: Secondary | ICD-10-CM | POA: Diagnosis not present

## 2017-01-19 DIAGNOSIS — L570 Actinic keratosis: Secondary | ICD-10-CM | POA: Diagnosis not present

## 2017-01-19 DIAGNOSIS — B079 Viral wart, unspecified: Secondary | ICD-10-CM | POA: Diagnosis not present

## 2017-01-19 DIAGNOSIS — L814 Other melanin hyperpigmentation: Secondary | ICD-10-CM | POA: Diagnosis not present

## 2017-01-19 DIAGNOSIS — D044 Carcinoma in situ of skin of scalp and neck: Secondary | ICD-10-CM | POA: Diagnosis not present

## 2017-01-19 DIAGNOSIS — L821 Other seborrheic keratosis: Secondary | ICD-10-CM | POA: Diagnosis not present

## 2017-01-19 DIAGNOSIS — Z85828 Personal history of other malignant neoplasm of skin: Secondary | ICD-10-CM | POA: Diagnosis not present

## 2017-01-19 DIAGNOSIS — D485 Neoplasm of uncertain behavior of skin: Secondary | ICD-10-CM | POA: Diagnosis not present

## 2017-01-19 DIAGNOSIS — D225 Melanocytic nevi of trunk: Secondary | ICD-10-CM | POA: Diagnosis not present

## 2017-01-19 DIAGNOSIS — C4442 Squamous cell carcinoma of skin of scalp and neck: Secondary | ICD-10-CM | POA: Diagnosis not present

## 2017-01-19 DIAGNOSIS — D1801 Hemangioma of skin and subcutaneous tissue: Secondary | ICD-10-CM | POA: Diagnosis not present

## 2017-02-02 DIAGNOSIS — H3552 Pigmentary retinal dystrophy: Secondary | ICD-10-CM | POA: Diagnosis not present

## 2017-02-03 DIAGNOSIS — L570 Actinic keratosis: Secondary | ICD-10-CM | POA: Diagnosis not present

## 2017-02-03 DIAGNOSIS — D485 Neoplasm of uncertain behavior of skin: Secondary | ICD-10-CM | POA: Diagnosis not present

## 2017-02-14 DIAGNOSIS — Z7901 Long term (current) use of anticoagulants: Secondary | ICD-10-CM | POA: Diagnosis not present

## 2017-02-23 DIAGNOSIS — D044 Carcinoma in situ of skin of scalp and neck: Secondary | ICD-10-CM | POA: Diagnosis not present

## 2017-03-14 DIAGNOSIS — I10 Essential (primary) hypertension: Secondary | ICD-10-CM | POA: Diagnosis not present

## 2017-03-14 DIAGNOSIS — N400 Enlarged prostate without lower urinary tract symptoms: Secondary | ICD-10-CM | POA: Diagnosis not present

## 2017-03-14 DIAGNOSIS — Z23 Encounter for immunization: Secondary | ICD-10-CM | POA: Diagnosis not present

## 2017-03-14 DIAGNOSIS — I4891 Unspecified atrial fibrillation: Secondary | ICD-10-CM | POA: Diagnosis not present

## 2017-03-14 DIAGNOSIS — Z7901 Long term (current) use of anticoagulants: Secondary | ICD-10-CM | POA: Diagnosis not present

## 2017-03-14 DIAGNOSIS — E7800 Pure hypercholesterolemia, unspecified: Secondary | ICD-10-CM | POA: Diagnosis not present

## 2017-04-11 DIAGNOSIS — Z7901 Long term (current) use of anticoagulants: Secondary | ICD-10-CM | POA: Diagnosis not present

## 2017-04-26 DIAGNOSIS — D1801 Hemangioma of skin and subcutaneous tissue: Secondary | ICD-10-CM | POA: Diagnosis not present

## 2017-04-26 DIAGNOSIS — L821 Other seborrheic keratosis: Secondary | ICD-10-CM | POA: Diagnosis not present

## 2017-04-26 DIAGNOSIS — Z85828 Personal history of other malignant neoplasm of skin: Secondary | ICD-10-CM | POA: Diagnosis not present

## 2017-04-26 DIAGNOSIS — L570 Actinic keratosis: Secondary | ICD-10-CM | POA: Diagnosis not present

## 2017-04-26 DIAGNOSIS — L814 Other melanin hyperpigmentation: Secondary | ICD-10-CM | POA: Diagnosis not present

## 2017-05-05 DIAGNOSIS — R000 Tachycardia, unspecified: Secondary | ICD-10-CM | POA: Diagnosis not present

## 2017-05-05 DIAGNOSIS — R5383 Other fatigue: Secondary | ICD-10-CM | POA: Diagnosis not present

## 2017-05-05 DIAGNOSIS — I4891 Unspecified atrial fibrillation: Secondary | ICD-10-CM | POA: Diagnosis not present

## 2017-05-09 DIAGNOSIS — Z7901 Long term (current) use of anticoagulants: Secondary | ICD-10-CM | POA: Diagnosis not present

## 2017-05-11 DIAGNOSIS — Z7901 Long term (current) use of anticoagulants: Secondary | ICD-10-CM | POA: Diagnosis not present

## 2017-05-18 DIAGNOSIS — Z7901 Long term (current) use of anticoagulants: Secondary | ICD-10-CM | POA: Diagnosis not present

## 2017-06-07 ENCOUNTER — Encounter: Payer: Self-pay | Admitting: Cardiology

## 2017-06-22 NOTE — Progress Notes (Signed)
Cardiology Office Note   Date:  06/23/2017   ID:  Adam Richards, DOB 05/12/1927, MRN [id number]  PCP:  Wenda Low, MD  Cardiologist:   Minus Breeding, MD   Chief Complaint  Patient presents with  . Atrial Fibrillation      History of Present Illness: Adam Richards is a 84 y.o. male who presents for evaluation of atrial fibrillation. He moved here from [place]. He has a long-standing history of permanent atrial fibrillation. He's been on anticoagulation for years. Since I last saw him he has done very well.  The patient denies any new symptoms such as chest discomfort, neck or arm discomfort. There has been no new shortness of breath, PND or orthopnea. There have been no reported palpitations, presyncope or syncope.    Past Medical History:  Diagnosis Date  . A-fib ([place])   . Hypertension     Past Surgical History:  Procedure Laterality Date  . KNEE SURGERY     "removed crystals"  . TONSILLECTOMY AND ADENOIDECTOMY    . UMBILICAL HERNIA REPAIR       Current Outpatient Medications  Medication Sig Dispense Refill  . amLODipine (NORVASC) 5 MG tablet Take 1 tablet by mouth daily.  0  . DIGOX 125 MCG tablet Take 1 tablet by mouth daily.  1  . docusate calcium (SURFAK) 240 MG capsule Take 240 mg by mouth daily.    Marland Kitchen dutasteride (AVODART) 0.5 MG capsule Take 1 capsule by mouth daily.  2  . lovastatin (MEVACOR) 20 MG tablet Take 20 mg by mouth daily.  1  . metoprolol tartrate (LOPRESSOR) 25 MG tablet Take 1 tablet by mouth 2 (two) times daily.    . tamsulosin (FLOMAX) 0.4 MG CAPS capsule Take 1 capsule by mouth daily.    Marland Kitchen triamterene-hydrochlorothiazide (MAXZIDE-25) 37.5-25 MG tablet Take 1 tablet by mouth daily.  2  . warfarin (COUMADIN) 2.5 MG tablet Take as directed     No current facility-administered medications for this visit.     Allergies:   Patient has no known allergies.    ROS:  Please see the history of present illness.   Otherwise, review of systems are  positive for none.   All other systems are reviewed and negative.    PHYSICAL EXAM: VS:  BP 132/82   Pulse 70   Ht 5\' 10"  (1.778 m)   Wt 194 lb 3.2 oz (88.1 kg)   BMI 27.86 kg/m  , BMI Body mass index is 27.86 kg/m.  GENERAL:  Well appearing NECK:  No jugular venous distention, waveform within normal limits, carotid upstroke brisk and symmetric, no bruits, no thyromegaly LUNGS:  Clear to auscultation bilaterally CHEST:  Unremarkable HEART:  PMI not displaced or sustained,S1 and S2 within normal limits, no S3, no clicks, no rubs, 2 out of 6 brief systolic murmur heard best at the apex, no diastolic murmurs, irregular ABD:  Flat, positive bowel sounds normal in frequency in pitch, no bruits, no rebound, no guarding, no midline pulsatile mass, no hepatomegaly, no splenomegaly EXT:  2 plus pulses throughout, no edema, no cyanosis no clubbing   EKG:  EKG is  ordered today. The ekg ordered today demonstrates atrial fibrillation, rate 70 , premature ventricular contractions, poor anterior R-wave progression, low voltage in leads, intervals within normal limits, no acute ST-T wave changes.   Recent Labs: No results found for requested labs within last 8760 hours.    Lipid Panel No results found for: CHOL, TRIG, HDL,  CHOLHDL, VLDL, LDLCALC, LDLDIRECT    Wt Readings from Last 3 Encounters:  06/23/17 194 lb 3.2 oz (88.1 kg)  06/18/16 195 lb (88.5 kg)  06/17/15 191 lb 3.2 oz (86.7 kg)      Other studies Reviewed: Additional studies/ records that were reviewed today include: None. Review of the above records demonstrates:     ASSESSMENT AND PLAN:  ATRIAL FIB:  The patient has chronic atrial fibrillation. Adam Richards has a CHA2DS2 - VASc score of 3 with a risk of stroke of 3.2%.  He tolerates the warfarin and has easily controlled INRs.  No change in therapy is indicated.  HTN: Blood pressure is well controlled.  He will continue on the meds as listed.  No change in therapy is  indicated.  Current medicines are reviewed at length with the patient today.  The patient does not have concerns regarding medicines.  The following changes have been made:  None  Labs/ tests ordered today include: None  Orders Placed This Encounter  Procedures  . EKG 12-Lead     Disposition:   FU with me in one year.   Signed, Minus Breeding, MD  06/23/2017 2:55 PM    Adam Richards

## 2017-06-23 ENCOUNTER — Ambulatory Visit (INDEPENDENT_AMBULATORY_CARE_PROVIDER_SITE_OTHER): Payer: Medicare Other | Admitting: Cardiology

## 2017-06-23 ENCOUNTER — Encounter: Payer: Self-pay | Admitting: Cardiology

## 2017-06-23 VITALS — BP 132/82 | HR 70 | Ht 70.0 in | Wt 194.2 lb

## 2017-06-23 DIAGNOSIS — I482 Chronic atrial fibrillation, unspecified: Secondary | ICD-10-CM

## 2017-06-23 NOTE — Patient Instructions (Signed)
Medication Instructions:  Continue current medications  If you need a refill on your cardiac medications before your next appointment, please call your pharmacy.  Labwork: None Ordered   Testing/Procedures: None Ordered  Follow-Up: Your physician wants you to follow-up in: 1 Year. You should receive a reminder letter in the mail two months in advance. If you do not receive a letter, please call our office [phone].    Thank you for choosing [facility]!!      

## 2017-06-27 DIAGNOSIS — Z7901 Long term (current) use of anticoagulants: Secondary | ICD-10-CM | POA: Diagnosis not present

## 2017-07-25 DIAGNOSIS — C44311 Basal cell carcinoma of skin of nose: Secondary | ICD-10-CM | POA: Diagnosis not present

## 2017-07-25 DIAGNOSIS — D485 Neoplasm of uncertain behavior of skin: Secondary | ICD-10-CM | POA: Diagnosis not present

## 2017-07-25 DIAGNOSIS — L570 Actinic keratosis: Secondary | ICD-10-CM | POA: Diagnosis not present

## 2017-07-25 DIAGNOSIS — Z85828 Personal history of other malignant neoplasm of skin: Secondary | ICD-10-CM | POA: Diagnosis not present

## 2017-07-25 DIAGNOSIS — L814 Other melanin hyperpigmentation: Secondary | ICD-10-CM | POA: Diagnosis not present

## 2017-07-26 DIAGNOSIS — Z7901 Long term (current) use of anticoagulants: Secondary | ICD-10-CM | POA: Diagnosis not present

## 2017-08-22 DIAGNOSIS — Z7901 Long term (current) use of anticoagulants: Secondary | ICD-10-CM | POA: Diagnosis not present

## 2017-09-05 DIAGNOSIS — Z0000 Encounter for general adult medical examination without abnormal findings: Secondary | ICD-10-CM | POA: Diagnosis not present

## 2017-09-05 DIAGNOSIS — E7800 Pure hypercholesterolemia, unspecified: Secondary | ICD-10-CM | POA: Diagnosis not present

## 2017-09-05 DIAGNOSIS — I10 Essential (primary) hypertension: Secondary | ICD-10-CM | POA: Diagnosis not present

## 2017-09-05 DIAGNOSIS — Z1389 Encounter for screening for other disorder: Secondary | ICD-10-CM | POA: Diagnosis not present

## 2017-09-05 DIAGNOSIS — Z7189 Other specified counseling: Secondary | ICD-10-CM | POA: Diagnosis not present

## 2017-09-05 DIAGNOSIS — N400 Enlarged prostate without lower urinary tract symptoms: Secondary | ICD-10-CM | POA: Diagnosis not present

## 2017-09-05 DIAGNOSIS — I4891 Unspecified atrial fibrillation: Secondary | ICD-10-CM | POA: Diagnosis not present

## 2017-09-05 DIAGNOSIS — Z6828 Body mass index (BMI) 28.0-28.9, adult: Secondary | ICD-10-CM | POA: Diagnosis not present

## 2017-09-19 DIAGNOSIS — Z7901 Long term (current) use of anticoagulants: Secondary | ICD-10-CM | POA: Diagnosis not present

## 2017-09-21 DIAGNOSIS — C44311 Basal cell carcinoma of skin of nose: Secondary | ICD-10-CM | POA: Diagnosis not present

## 2017-10-19 DIAGNOSIS — Z7901 Long term (current) use of anticoagulants: Secondary | ICD-10-CM | POA: Diagnosis not present

## 2017-11-01 ENCOUNTER — Encounter

## 2017-11-16 DIAGNOSIS — Z7901 Long term (current) use of anticoagulants: Secondary | ICD-10-CM | POA: Diagnosis not present

## 2017-12-06 DIAGNOSIS — H3552 Pigmentary retinal dystrophy: Secondary | ICD-10-CM | POA: Diagnosis not present

## 2017-12-14 DIAGNOSIS — Z7901 Long term (current) use of anticoagulants: Secondary | ICD-10-CM | POA: Diagnosis not present

## 2018-01-03 DIAGNOSIS — Z7901 Long term (current) use of anticoagulants: Secondary | ICD-10-CM | POA: Diagnosis not present

## 2018-01-31 DIAGNOSIS — Z7901 Long term (current) use of anticoagulants: Secondary | ICD-10-CM | POA: Diagnosis not present

## 2018-03-01 DIAGNOSIS — Z7901 Long term (current) use of anticoagulants: Secondary | ICD-10-CM | POA: Diagnosis not present

## 2018-03-01 DIAGNOSIS — Z23 Encounter for immunization: Secondary | ICD-10-CM | POA: Diagnosis not present

## 2018-03-20 DIAGNOSIS — I10 Essential (primary) hypertension: Secondary | ICD-10-CM | POA: Diagnosis not present

## 2018-03-20 DIAGNOSIS — I4891 Unspecified atrial fibrillation: Secondary | ICD-10-CM | POA: Diagnosis not present

## 2018-03-20 DIAGNOSIS — E7800 Pure hypercholesterolemia, unspecified: Secondary | ICD-10-CM | POA: Diagnosis not present

## 2018-03-20 DIAGNOSIS — N400 Enlarged prostate without lower urinary tract symptoms: Secondary | ICD-10-CM | POA: Diagnosis not present

## 2018-03-29 DIAGNOSIS — Z7901 Long term (current) use of anticoagulants: Secondary | ICD-10-CM | POA: Diagnosis not present

## 2018-04-25 DIAGNOSIS — L821 Other seborrheic keratosis: Secondary | ICD-10-CM | POA: Diagnosis not present

## 2018-04-25 DIAGNOSIS — C44212 Basal cell carcinoma of skin of right ear and external auricular canal: Secondary | ICD-10-CM | POA: Diagnosis not present

## 2018-04-25 DIAGNOSIS — L570 Actinic keratosis: Secondary | ICD-10-CM | POA: Diagnosis not present

## 2018-04-25 DIAGNOSIS — C4442 Squamous cell carcinoma of skin of scalp and neck: Secondary | ICD-10-CM | POA: Diagnosis not present

## 2018-04-25 DIAGNOSIS — D485 Neoplasm of uncertain behavior of skin: Secondary | ICD-10-CM | POA: Diagnosis not present

## 2018-04-25 DIAGNOSIS — L2084 Intrinsic (allergic) eczema: Secondary | ICD-10-CM | POA: Diagnosis not present

## 2018-04-25 DIAGNOSIS — C44219 Basal cell carcinoma of skin of left ear and external auricular canal: Secondary | ICD-10-CM | POA: Diagnosis not present

## 2018-04-25 DIAGNOSIS — Z85828 Personal history of other malignant neoplasm of skin: Secondary | ICD-10-CM | POA: Diagnosis not present

## 2018-04-26 DIAGNOSIS — Z7901 Long term (current) use of anticoagulants: Secondary | ICD-10-CM | POA: Diagnosis not present

## 2018-05-20 DIAGNOSIS — R000 Tachycardia, unspecified: Secondary | ICD-10-CM | POA: Diagnosis not present

## 2018-05-20 DIAGNOSIS — R05 Cough: Secondary | ICD-10-CM | POA: Diagnosis not present

## 2018-05-26 DIAGNOSIS — Z7901 Long term (current) use of anticoagulants: Secondary | ICD-10-CM | POA: Diagnosis not present

## 2018-06-23 DIAGNOSIS — Z7901 Long term (current) use of anticoagulants: Secondary | ICD-10-CM | POA: Diagnosis not present

## 2018-06-26 DIAGNOSIS — Z85828 Personal history of other malignant neoplasm of skin: Secondary | ICD-10-CM | POA: Diagnosis not present

## 2018-06-26 DIAGNOSIS — L820 Inflamed seborrheic keratosis: Secondary | ICD-10-CM | POA: Diagnosis not present

## 2018-06-26 DIAGNOSIS — L821 Other seborrheic keratosis: Secondary | ICD-10-CM | POA: Diagnosis not present

## 2018-06-26 DIAGNOSIS — L570 Actinic keratosis: Secondary | ICD-10-CM | POA: Diagnosis not present

## 2018-07-10 DIAGNOSIS — Z7901 Long term (current) use of anticoagulants: Secondary | ICD-10-CM | POA: Diagnosis not present

## 2018-07-13 DIAGNOSIS — Z7901 Long term (current) use of anticoagulants: Secondary | ICD-10-CM | POA: Diagnosis not present

## 2018-07-21 DIAGNOSIS — Z7901 Long term (current) use of anticoagulants: Secondary | ICD-10-CM | POA: Diagnosis not present

## 2018-07-26 DIAGNOSIS — C44212 Basal cell carcinoma of skin of right ear and external auricular canal: Secondary | ICD-10-CM | POA: Diagnosis not present

## 2018-07-26 DIAGNOSIS — C44219 Basal cell carcinoma of skin of left ear and external auricular canal: Secondary | ICD-10-CM | POA: Diagnosis not present

## 2018-08-18 DIAGNOSIS — Z7901 Long term (current) use of anticoagulants: Secondary | ICD-10-CM | POA: Diagnosis not present

## 2018-09-11 DIAGNOSIS — Z0000 Encounter for general adult medical examination without abnormal findings: Secondary | ICD-10-CM | POA: Diagnosis not present

## 2018-09-11 DIAGNOSIS — I4891 Unspecified atrial fibrillation: Secondary | ICD-10-CM | POA: Diagnosis not present

## 2018-09-11 DIAGNOSIS — Z7901 Long term (current) use of anticoagulants: Secondary | ICD-10-CM | POA: Diagnosis not present

## 2018-09-11 DIAGNOSIS — I10 Essential (primary) hypertension: Secondary | ICD-10-CM | POA: Diagnosis not present

## 2018-09-11 DIAGNOSIS — Z1389 Encounter for screening for other disorder: Secondary | ICD-10-CM | POA: Diagnosis not present

## 2018-09-11 DIAGNOSIS — E7800 Pure hypercholesterolemia, unspecified: Secondary | ICD-10-CM | POA: Diagnosis not present

## 2018-09-11 DIAGNOSIS — N400 Enlarged prostate without lower urinary tract symptoms: Secondary | ICD-10-CM | POA: Diagnosis not present

## 2018-09-21 DIAGNOSIS — N400 Enlarged prostate without lower urinary tract symptoms: Secondary | ICD-10-CM | POA: Diagnosis not present

## 2018-09-21 DIAGNOSIS — Z7901 Long term (current) use of anticoagulants: Secondary | ICD-10-CM | POA: Diagnosis not present

## 2018-09-21 DIAGNOSIS — I10 Essential (primary) hypertension: Secondary | ICD-10-CM | POA: Diagnosis not present

## 2018-09-21 DIAGNOSIS — E7800 Pure hypercholesterolemia, unspecified: Secondary | ICD-10-CM | POA: Diagnosis not present

## 2018-09-21 DIAGNOSIS — I4891 Unspecified atrial fibrillation: Secondary | ICD-10-CM | POA: Diagnosis not present

## 2018-09-29 DIAGNOSIS — D692 Other nonthrombocytopenic purpura: Secondary | ICD-10-CM | POA: Diagnosis not present

## 2018-09-29 DIAGNOSIS — D1801 Hemangioma of skin and subcutaneous tissue: Secondary | ICD-10-CM | POA: Diagnosis not present

## 2018-09-29 DIAGNOSIS — D485 Neoplasm of uncertain behavior of skin: Secondary | ICD-10-CM | POA: Diagnosis not present

## 2018-09-29 DIAGNOSIS — L821 Other seborrheic keratosis: Secondary | ICD-10-CM | POA: Diagnosis not present

## 2018-09-29 DIAGNOSIS — L570 Actinic keratosis: Secondary | ICD-10-CM | POA: Diagnosis not present

## 2018-09-29 DIAGNOSIS — L814 Other melanin hyperpigmentation: Secondary | ICD-10-CM | POA: Diagnosis not present

## 2018-09-29 DIAGNOSIS — Z85828 Personal history of other malignant neoplasm of skin: Secondary | ICD-10-CM | POA: Diagnosis not present

## 2018-10-10 DIAGNOSIS — Z20828 Contact with and (suspected) exposure to other viral communicable diseases: Secondary | ICD-10-CM | POA: Diagnosis not present

## 2018-10-25 DIAGNOSIS — Z7901 Long term (current) use of anticoagulants: Secondary | ICD-10-CM | POA: Diagnosis not present

## 2018-10-27 DIAGNOSIS — Z20828 Contact with and (suspected) exposure to other viral communicable diseases: Secondary | ICD-10-CM | POA: Diagnosis not present

## 2018-10-27 DIAGNOSIS — Z1383 Encounter for screening for respiratory disorder NEC: Secondary | ICD-10-CM | POA: Diagnosis not present

## 2018-11-01 DIAGNOSIS — Z7901 Long term (current) use of anticoagulants: Secondary | ICD-10-CM | POA: Diagnosis not present

## 2018-11-15 DIAGNOSIS — Z7901 Long term (current) use of anticoagulants: Secondary | ICD-10-CM | POA: Diagnosis not present

## 2018-11-30 DIAGNOSIS — Z7901 Long term (current) use of anticoagulants: Secondary | ICD-10-CM | POA: Diagnosis not present

## 2018-12-11 DIAGNOSIS — H1820 Unspecified corneal edema: Secondary | ICD-10-CM | POA: Diagnosis not present

## 2018-12-11 DIAGNOSIS — H52203 Unspecified astigmatism, bilateral: Secondary | ICD-10-CM | POA: Diagnosis not present

## 2018-12-18 DIAGNOSIS — H1820 Unspecified corneal edema: Secondary | ICD-10-CM | POA: Diagnosis not present

## 2018-12-20 DIAGNOSIS — Z7901 Long term (current) use of anticoagulants: Secondary | ICD-10-CM | POA: Diagnosis not present

## 2019-01-03 DIAGNOSIS — I4891 Unspecified atrial fibrillation: Secondary | ICD-10-CM | POA: Diagnosis not present

## 2019-01-03 DIAGNOSIS — Z7901 Long term (current) use of anticoagulants: Secondary | ICD-10-CM | POA: Diagnosis not present

## 2019-01-17 DIAGNOSIS — H903 Sensorineural hearing loss, bilateral: Secondary | ICD-10-CM | POA: Diagnosis not present

## 2019-01-20 DIAGNOSIS — I482 Chronic atrial fibrillation, unspecified: Secondary | ICD-10-CM

## 2019-01-21 NOTE — Progress Notes (Signed)
Cardiology Office Note   Date:  01/22/2019   ID:  Adam Richards, DOB [birth year], MRN [id number]  PCP:  Adam Low, MD  Cardiologist:   Adam Breeding, MD   Chief Complaint  Patient presents with  . Atrial Fibrillation     History of Present Illness: Adam Richards is a 85 y.o. male who presents for evaluation of atrial fibrillation. He moved here from [place]. He has a long-standing history of permanent atrial fibrillation. He's been on anticoagulation for years. Since I last saw him he has done well.  The patient denies any new symptoms such as chest discomfort, neck or arm discomfort. There has been no new shortness of breath, PND or orthopnea. There have been no reported palpitations, presyncope or syncope.  Past Medical History:  Diagnosis Date  . A-fib ([place])   . Hypertension     Past Surgical History:  Procedure Laterality Date  . KNEE SURGERY     "removed crystals"  . TONSILLECTOMY AND ADENOIDECTOMY    . UMBILICAL HERNIA REPAIR       Current Outpatient Medications  Medication Sig Dispense Refill  . amLODipine (NORVASC) 5 MG tablet Take 1 tablet by mouth daily.  0  . DIGOX 125 MCG tablet Take 1 tablet by mouth daily.  1  . docusate calcium (SURFAK) 240 MG capsule Take 240 mg by mouth daily.    Marland Kitchen dutasteride (AVODART) 0.5 MG capsule Take 1 capsule by mouth daily.  2  . lovastatin (MEVACOR) 20 MG tablet Take 20 mg by mouth daily.  1  . metoprolol tartrate (LOPRESSOR) 25 MG tablet Take 1 tablet by mouth 2 (two) times daily.    . tamsulosin (FLOMAX) 0.4 MG CAPS capsule Take 1 capsule by mouth daily.    Marland Kitchen triamterene-hydrochlorothiazide (MAXZIDE-25) 37.5-25 MG tablet Take 1 tablet by mouth daily.  2  . warfarin (COUMADIN) 2.5 MG tablet Take as directed     No current facility-administered medications for this visit.     Allergies:   Patient has no known allergies.    ROS:  Please see the history of present illness.   Otherwise, review of systems are positive  for none.   All other systems are reviewed and negative.    PHYSICAL EXAM: VS:  BP 134/88   Pulse 79   Temp 97.7 F (36.5 C) (Temporal)   Ht 5\' 10"  (1.778 m)   Wt 192 lb 9.6 oz (87.4 kg)   SpO2 99%   BMI 27.64 kg/m  , BMI Body mass index is 27.64 kg/m.  GENERAL:  Well appearing NECK:  No jugular venous distention, waveform within normal limits, carotid upstroke brisk and symmetric, no bruits, no thyromegaly LUNGS:  Clear to auscultation bilaterally CHEST:  Unremarkable HEART:  PMI not displaced or sustained,S1 and S2 within normal limits, no S3, no clicks, no rubs, no murmurs ABD:  Flat, positive bowel sounds normal in frequency in pitch, no bruits, no rebound, no guarding, no midline pulsatile mass, no hepatomegaly, no splenomegaly EXT:  2 plus pulses throughout, no edema, no cyanosis no clubbing   EKG:  EKG is   ordered today. The ekg ordered today demonstrates atrial fibrillation, rate 79, premature ventricular contractions, poor anterior R-wave progression, Richards voltage in leads, intervals within normal limits, no acute ST-T wave changes.     Recent Labs: No results found for requested labs within last 8760 hours.    Lipid Panel No results found for: CHOL, TRIG, HDL, CHOLHDL, VLDL, LDLCALC, LDLDIRECT  Wt Readings from Last 3 Encounters:  01/22/19 192 lb 9.6 oz (87.4 kg)  06/23/17 194 lb 3.2 oz (88.1 kg)  06/18/16 195 lb (88.5 kg)      Other studies Reviewed: Additional studies/ records that were reviewed today include: . Review of the above records demonstrates:     ASSESSMENT AND PLAN:  ATRIAL FIB:  The patient has chronic atrial fibrillation. [name] has a CHA2DS2 - VASc score of 3 with a risk of stroke of 3.2%.  He does not   He tolerates the warfarin and has easily controlled INRs.  He does not want to change.   HTN: Blood pressure is well controlled.  He will continue on the meds as listed.  No change in therapy is indicated.  Current medicines  are reviewed at length with the patient today.  The patient does not have concerns regarding medicines.  The following changes have been made:  None  Labs/ tests ordered today include: None  No orders of the defined types were placed in this encounter.   Disposition:   FU with me as needed.   Signed, Adam Breeding, MD  01/22/2019 6:08 PM    [place]

## 2019-01-22 ENCOUNTER — Ambulatory Visit (INDEPENDENT_AMBULATORY_CARE_PROVIDER_SITE_OTHER): Payer: Medicare Other | Admitting: Cardiology

## 2019-01-22 ENCOUNTER — Other Ambulatory Visit: Payer: Self-pay

## 2019-01-22 ENCOUNTER — Encounter: Payer: Self-pay | Admitting: Cardiology

## 2019-01-22 VITALS — BP 134/88 | HR 79 | Temp 97.7°F | Ht 70.0 in | Wt 192.6 lb

## 2019-01-22 DIAGNOSIS — I482 Chronic atrial fibrillation, unspecified: Secondary | ICD-10-CM

## 2019-01-22 NOTE — Patient Instructions (Signed)
Medication Instructions:  Your physician recommends that you continue on your current medications as directed. Please refer to the Current Medication list given to you today.  If you need a refill on your cardiac medications before your next appointment, please call your pharmacy.   Lab work: NONE  Testing/Procedures: NONE  Follow-Up: AS NEEDED   

## 2019-01-23 ENCOUNTER — Ambulatory Visit: Payer: Medicare Other | Admitting: Cardiology

## 2019-01-23 DIAGNOSIS — Z7901 Long term (current) use of anticoagulants: Secondary | ICD-10-CM | POA: Diagnosis not present

## 2019-01-24 DIAGNOSIS — C44212 Basal cell carcinoma of skin of right ear and external auricular canal: Secondary | ICD-10-CM | POA: Diagnosis not present

## 2019-01-25 ENCOUNTER — Other Ambulatory Visit (INDEPENDENT_AMBULATORY_CARE_PROVIDER_SITE_OTHER): Payer: Medicare Other

## 2019-01-25 DIAGNOSIS — I482 Chronic atrial fibrillation, unspecified: Secondary | ICD-10-CM

## 2019-02-21 DIAGNOSIS — Z7901 Long term (current) use of anticoagulants: Secondary | ICD-10-CM | POA: Diagnosis not present

## 2019-03-05 DIAGNOSIS — C44329 Squamous cell carcinoma of skin of other parts of face: Secondary | ICD-10-CM | POA: Diagnosis not present

## 2019-03-05 DIAGNOSIS — D485 Neoplasm of uncertain behavior of skin: Secondary | ICD-10-CM | POA: Diagnosis not present

## 2019-03-05 DIAGNOSIS — L570 Actinic keratosis: Secondary | ICD-10-CM | POA: Diagnosis not present

## 2019-03-05 DIAGNOSIS — D225 Melanocytic nevi of trunk: Secondary | ICD-10-CM | POA: Diagnosis not present

## 2019-03-05 DIAGNOSIS — C4442 Squamous cell carcinoma of skin of scalp and neck: Secondary | ICD-10-CM | POA: Diagnosis not present

## 2019-03-05 DIAGNOSIS — L821 Other seborrheic keratosis: Secondary | ICD-10-CM | POA: Diagnosis not present

## 2019-03-05 DIAGNOSIS — Z85828 Personal history of other malignant neoplasm of skin: Secondary | ICD-10-CM | POA: Diagnosis not present

## 2019-03-05 DIAGNOSIS — L249 Irritant contact dermatitis, unspecified cause: Secondary | ICD-10-CM | POA: Diagnosis not present

## 2019-03-21 DIAGNOSIS — Z7901 Long term (current) use of anticoagulants: Secondary | ICD-10-CM | POA: Diagnosis not present

## 2019-04-13 DIAGNOSIS — Z7901 Long term (current) use of anticoagulants: Secondary | ICD-10-CM | POA: Diagnosis not present

## 2019-04-17 DIAGNOSIS — D044 Carcinoma in situ of skin of scalp and neck: Secondary | ICD-10-CM | POA: Diagnosis not present

## 2019-05-14 ENCOUNTER — Emergency Department (HOSPITAL_COMMUNITY): Payer: Medicare Other

## 2019-05-14 ENCOUNTER — Other Ambulatory Visit: Payer: Self-pay

## 2019-05-14 ENCOUNTER — Inpatient Hospital Stay (HOSPITAL_COMMUNITY)
Admission: EM | Admit: 2019-05-14 | Discharge: 2019-05-19 | DRG: 552 | Disposition: A | Discharge status: Home Health | Payer: Medicare Other | Attending: Internal Medicine | Admitting: Internal Medicine

## 2019-05-14 DIAGNOSIS — M545 Low back pain, unspecified: Secondary | ICD-10-CM | POA: Diagnosis present

## 2019-05-14 DIAGNOSIS — Z79899 Other long term (current) drug therapy: Secondary | ICD-10-CM

## 2019-05-14 DIAGNOSIS — M549 Dorsalgia, unspecified: Secondary | ICD-10-CM | POA: Diagnosis present

## 2019-05-14 DIAGNOSIS — D649 Anemia, unspecified: Secondary | ICD-10-CM | POA: Diagnosis present

## 2019-05-14 DIAGNOSIS — R791 Abnormal coagulation profile: Secondary | ICD-10-CM | POA: Diagnosis present

## 2019-05-14 DIAGNOSIS — S32020A Wedge compression fracture of second lumbar vertebra, initial encounter for closed fracture: Secondary | ICD-10-CM

## 2019-05-14 DIAGNOSIS — I482 Chronic atrial fibrillation, unspecified: Secondary | ICD-10-CM

## 2019-05-14 DIAGNOSIS — Z7901 Long term (current) use of anticoagulants: Secondary | ICD-10-CM

## 2019-05-14 DIAGNOSIS — S22000A Wedge compression fracture of unspecified thoracic vertebra, initial encounter for closed fracture: Secondary | ICD-10-CM

## 2019-05-14 DIAGNOSIS — M79605 Pain in left leg: Secondary | ICD-10-CM | POA: Diagnosis present

## 2019-05-14 DIAGNOSIS — M4854XA Collapsed vertebra, not elsewhere classified, thoracic region, initial encounter for fracture: Secondary | ICD-10-CM | POA: Diagnosis present

## 2019-05-14 DIAGNOSIS — I4891 Unspecified atrial fibrillation: Secondary | ICD-10-CM

## 2019-05-14 DIAGNOSIS — Z8249 Family history of ischemic heart disease and other diseases of the circulatory system: Secondary | ICD-10-CM

## 2019-05-14 DIAGNOSIS — M48061 Spinal stenosis, lumbar region without neurogenic claudication: Principal | ICD-10-CM | POA: Diagnosis present

## 2019-05-14 DIAGNOSIS — Z8042 Family history of malignant neoplasm of prostate: Secondary | ICD-10-CM

## 2019-05-14 DIAGNOSIS — I10 Essential (primary) hypertension: Secondary | ICD-10-CM | POA: Diagnosis present

## 2019-05-14 DIAGNOSIS — R000 Tachycardia, unspecified: Secondary | ICD-10-CM

## 2019-05-14 DIAGNOSIS — M5134 Other intervertebral disc degeneration, thoracic region: Secondary | ICD-10-CM | POA: Diagnosis present

## 2019-05-14 DIAGNOSIS — I4821 Permanent atrial fibrillation: Secondary | ICD-10-CM | POA: Diagnosis present

## 2019-05-14 DIAGNOSIS — Z20828 Contact with and (suspected) exposure to other viral communicable diseases: Secondary | ICD-10-CM | POA: Diagnosis present

## 2019-05-14 DIAGNOSIS — Z823 Family history of stroke: Secondary | ICD-10-CM

## 2019-05-14 DIAGNOSIS — E871 Hypo-osmolality and hyponatremia: Secondary | ICD-10-CM | POA: Diagnosis not present

## 2019-05-14 DIAGNOSIS — J9811 Atelectasis: Secondary | ICD-10-CM | POA: Diagnosis not present

## 2019-05-14 DIAGNOSIS — M4856XA Collapsed vertebra, not elsewhere classified, lumbar region, initial encounter for fracture: Secondary | ICD-10-CM | POA: Diagnosis present

## 2019-05-14 DIAGNOSIS — F17290 Nicotine dependence, other tobacco product, uncomplicated: Secondary | ICD-10-CM | POA: Diagnosis present

## 2019-05-14 DIAGNOSIS — M4850XA Collapsed vertebra, not elsewhere classified, site unspecified, initial encounter for fracture: Secondary | ICD-10-CM | POA: Diagnosis present

## 2019-05-14 LAB — URINALYSIS, ROUTINE W REFLEX MICROSCOPIC
Bilirubin Urine: NEGATIVE
Glucose, UA: NEGATIVE mg/dL
Hgb urine dipstick: NEGATIVE
Ketones, ur: 5 mg/dL — AB
Leukocytes,Ua: NEGATIVE
Nitrite: NEGATIVE
Protein, ur: NEGATIVE mg/dL
Specific Gravity, Urine: 1.011 (ref 1.005–1.030)
pH: 7 (ref 5.0–8.0)

## 2019-05-14 LAB — LACTIC ACID, PLASMA: Lactic Acid, Venous: 1.7 mmol/L (ref 0.5–1.9)

## 2019-05-14 LAB — LIPASE, BLOOD: Lipase: 24 U/L (ref 11–51)

## 2019-05-14 LAB — COMPREHENSIVE METABOLIC PANEL
ALT: 20 U/L (ref 0–44)
AST: 28 U/L (ref 15–41)
Albumin: 4.1 g/dL (ref 3.5–5.0)
Alkaline Phosphatase: 93 U/L (ref 38–126)
Anion gap: 10 (ref 5–15)
BUN: 13 mg/dL (ref 8–23)
CO2: 24 mmol/L (ref 22–32)
Calcium: 9.3 mg/dL (ref 8.9–10.3)
Chloride: 93 mmol/L — ABNORMAL LOW (ref 98–111)
Creatinine, Ser: 0.8 mg/dL (ref 0.61–1.24)
GFR calc Af Amer: >60 mL/min (ref >60)
GFR calc non Af Amer: >60 mL/min (ref >60)
Glucose, Bld: 98 mg/dL (ref 70–99)
Potassium: 4.1 mmol/L (ref 3.5–5.1)
Sodium: 127 mmol/L — ABNORMAL LOW (ref 135–145)
Total Bilirubin: 1 mg/dL (ref 0.3–1.2)
Total Protein: 6.7 g/dL (ref 6.5–8.1)

## 2019-05-14 LAB — CBC
HCT: 34.7 % — ABNORMAL LOW (ref 39.0–52.0)
Hemoglobin: 12.2 g/dL — ABNORMAL LOW (ref 13.0–17.0)
MCH: 33.4 pg (ref 26.0–34.0)
MCHC: 35.2 g/dL (ref 30.0–36.0)
MCV: 95.1 fL (ref 80.0–100.0)
Platelets: 186 10*3/uL (ref 150–400)
RBC: 3.65 MIL/uL — ABNORMAL LOW (ref 4.22–5.81)
RDW: 14 % (ref 11.5–15.5)
WBC: 8.8 10*3/uL (ref 4.0–10.5)
nRBC: 0 % (ref 0.0–0.2)

## 2019-05-14 LAB — PROTIME-INR
INR: 1.9 — ABNORMAL HIGH (ref 0.8–1.2)
Prothrombin Time: 21.7 seconds — ABNORMAL HIGH (ref 11.4–15.2)

## 2019-05-14 IMAGING — DX DG CHEST 1V PORT
2 series · 2 of 2 positions shown · non-contrast
Comparison: Chest radiograph dated [DATE].

CLINICAL DATA: [AGE] male with tachycardia.

EXAM:
PORTABLE CHEST 1 VIEW

[chest ap (1 of 2)]
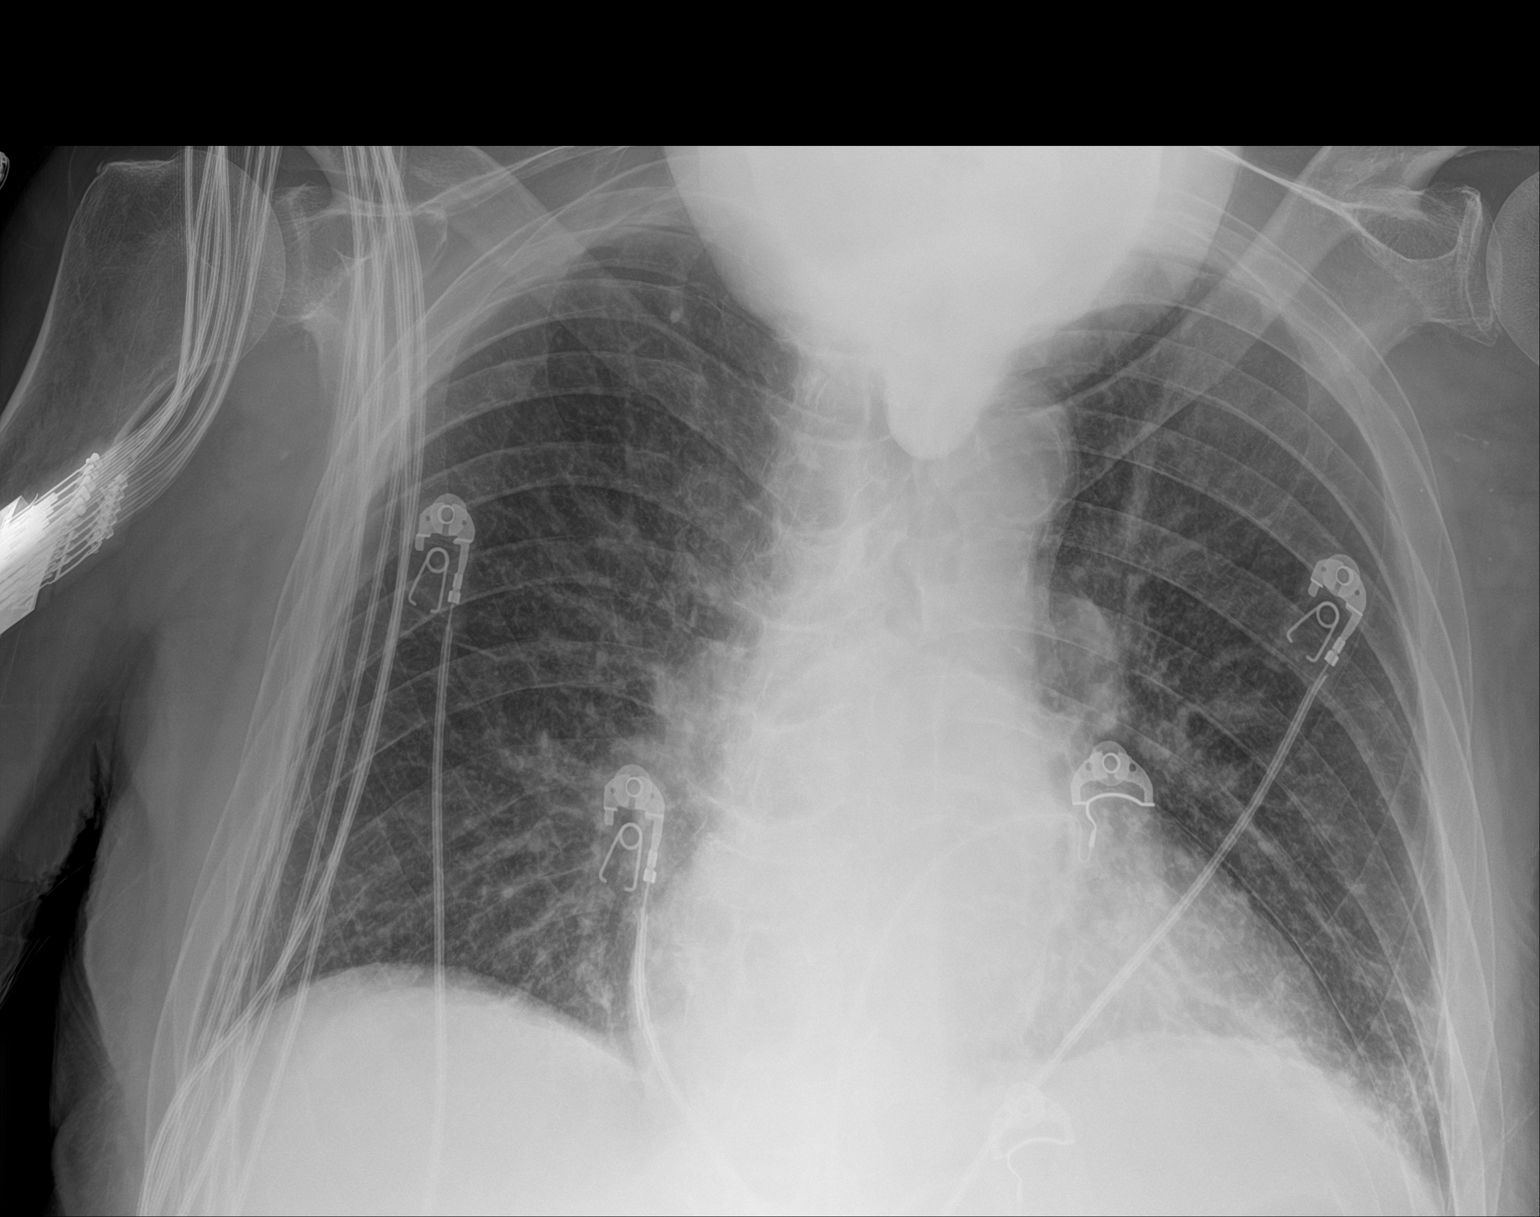

[chest ap (2 of 2)]
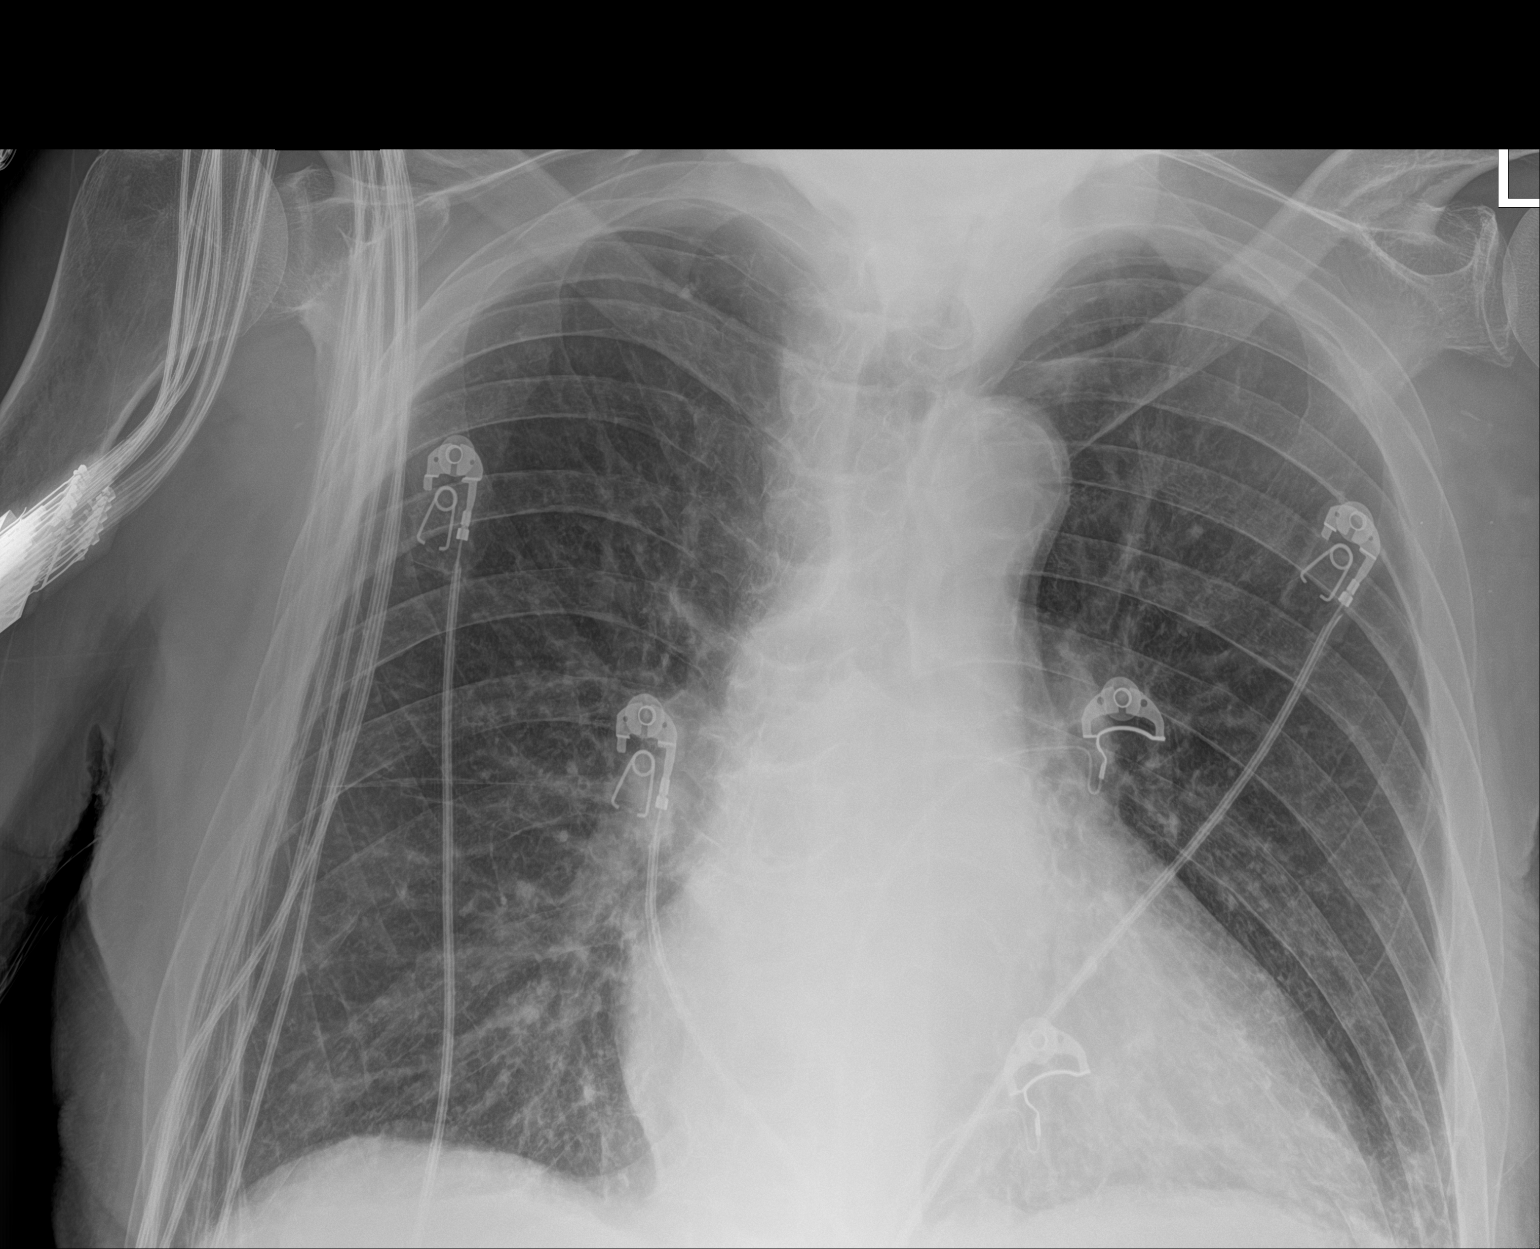

[2 of 2 positions shown; findings below may reference images not displayed]

FINDINGS: Minimal left lung base atelectasis. No focal consolidation, pleural
effusion, or pneumothorax. Stable mild cardiomegaly. Atherosclerotic
calcification of the aorta. No acute osseous pathology.
IMPRESSION: 1. No active disease.
2. Stable mild cardiomegaly.

## 2019-05-14 IMAGING — CT CT ANGIO CHEST-ABD-PELV FOR DISSECTION W/ AND WO/W CM
2 of 7 series · 13 of 46 positions shown, 15 images · IV contrast (APPLIED)
Comparison: None.

CLINICAL DATA: Confusion lower back pain and abdominal pain

EXAM:
CT ANGIOGRAPHY CHEST, ABDOMEN AND PELVIS
TECHNIQUE: Multidetector CT imaging through the chest, abdomen and pelvis was
performed using the standard protocol during bolus administration of
intravenous contrast. Multiplanar reconstructed images and MIPs were
obtained and reviewed to evaluate the vascular anatomy.
CONTRAST:  100mL OMNIPAQUE IOHEXOL 350 MG/ML SOLN

[Series 6: arterial · axial · arterial · 0.84mm/px · z∈[+658,+1170]mm · 10 of 290 slices shown, 12 images]
[im 17/290  soft-tissue]
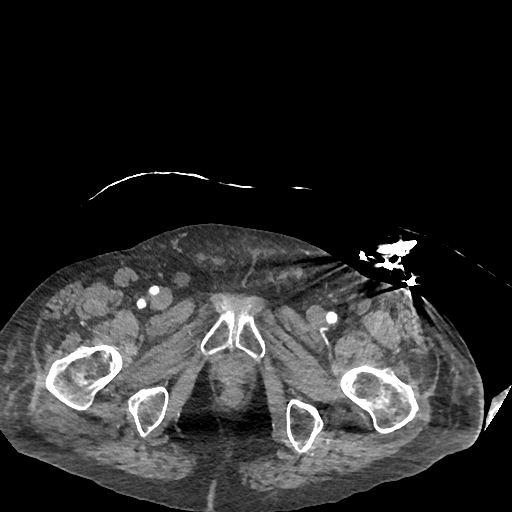
[im 17/290  bone]
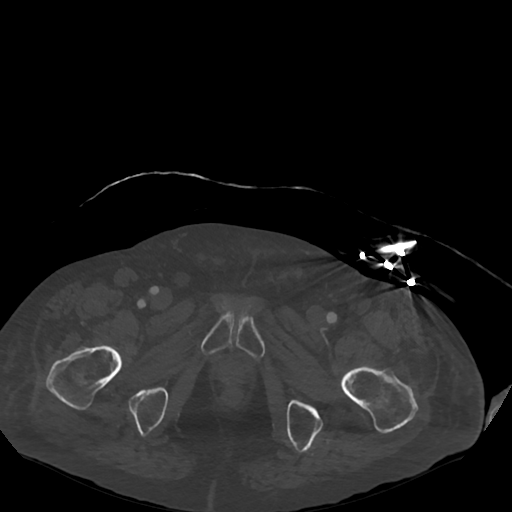
[im 49/290  soft-tissue]
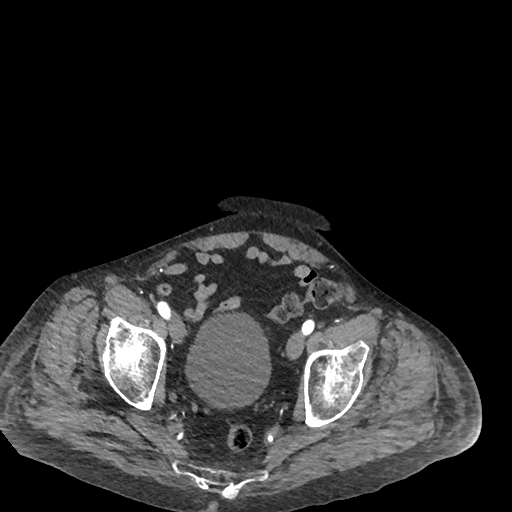
[im 81/290  soft-tissue]
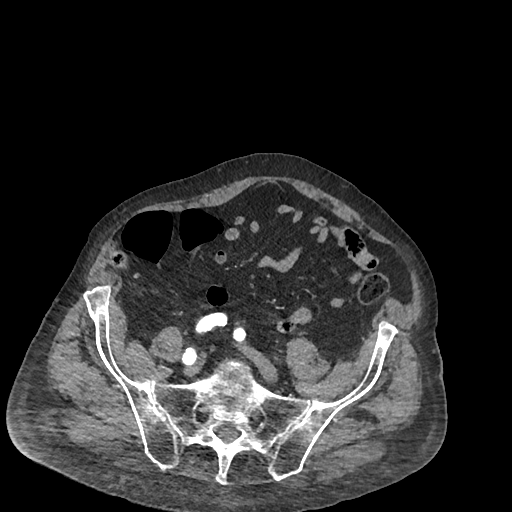
[im 97/290  soft-tissue]
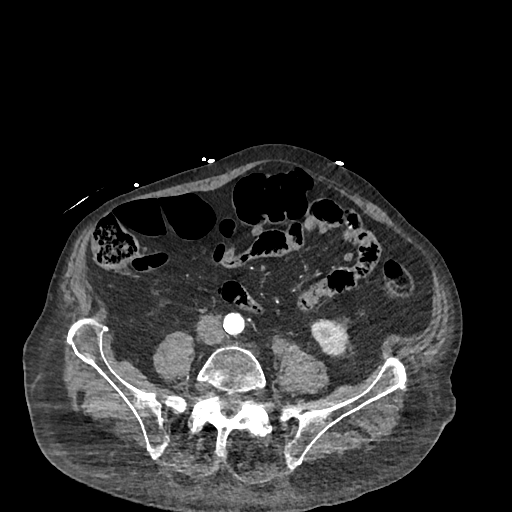
[im 129/290  soft-tissue]
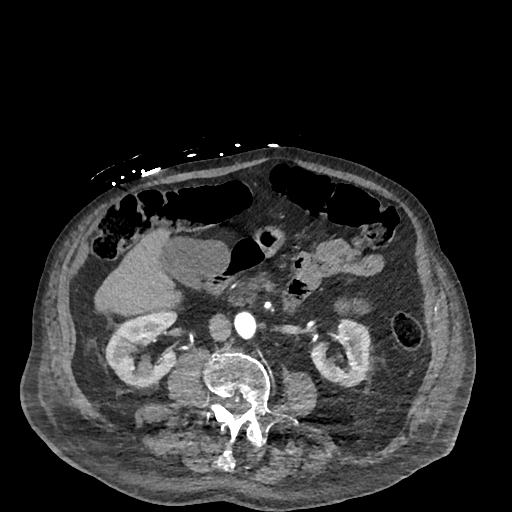
[im 161/290  soft-tissue]
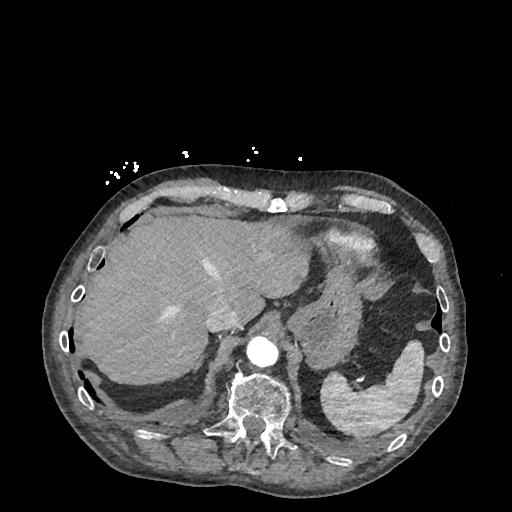
[im 193/290  soft-tissue]
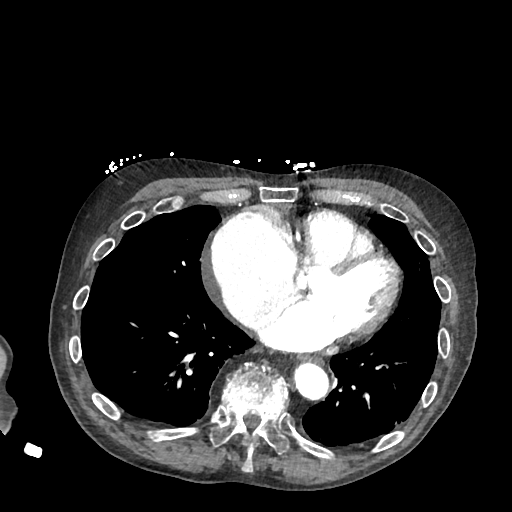
[im 209/290  soft-tissue]
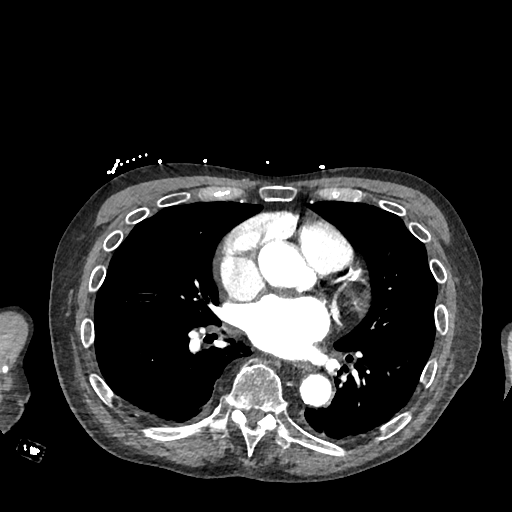
[im 241/290  soft-tissue]
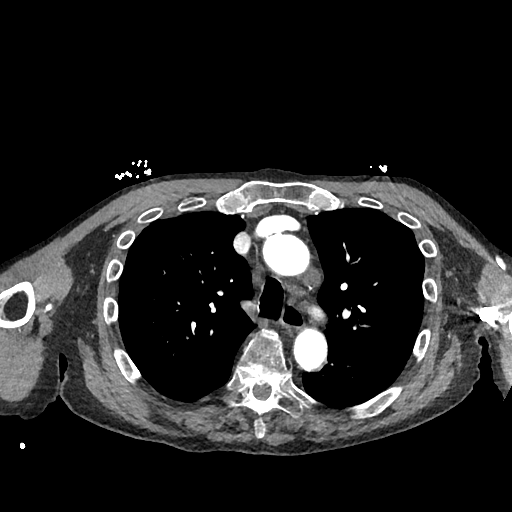
[im 241/290  bone]
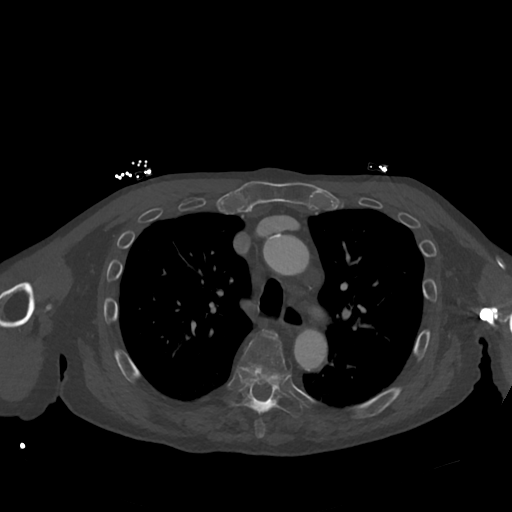
[im 273/290  soft-tissue]
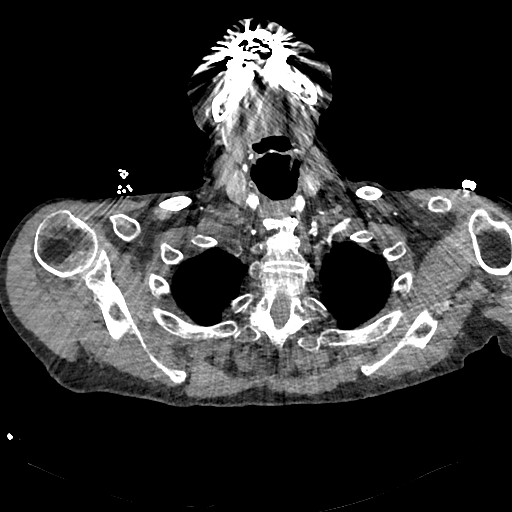

[Series 9: cor · coronal · 0.85mm/px · 3 of 150 slices shown]
[im 38/150  soft-tissue]
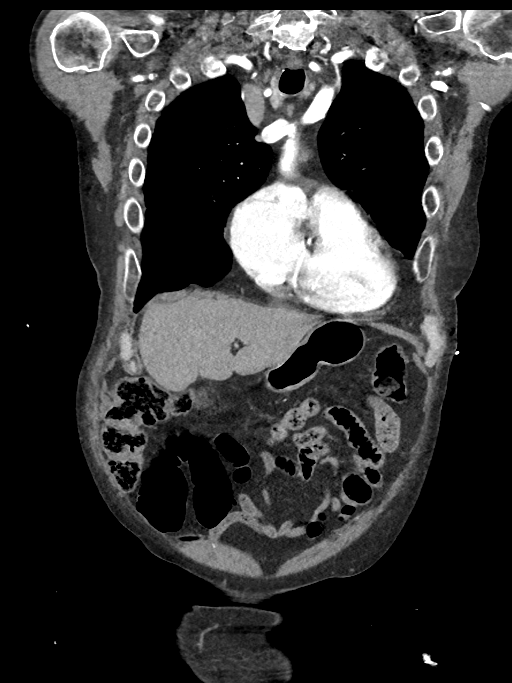
[im 75/150  soft-tissue]
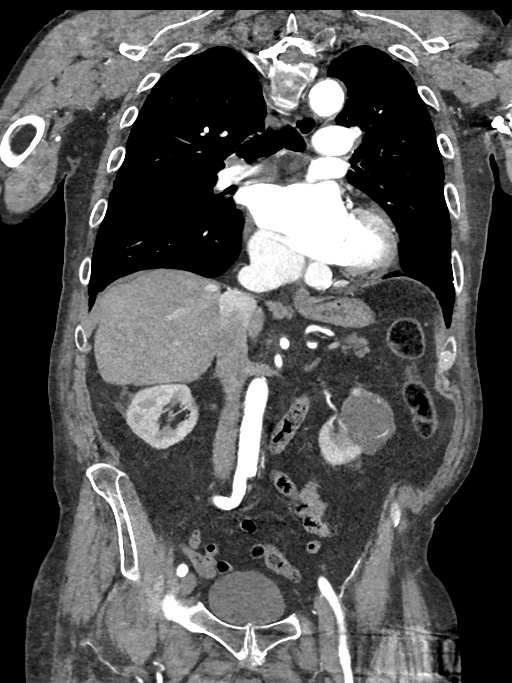
[im 112/150  soft-tissue]
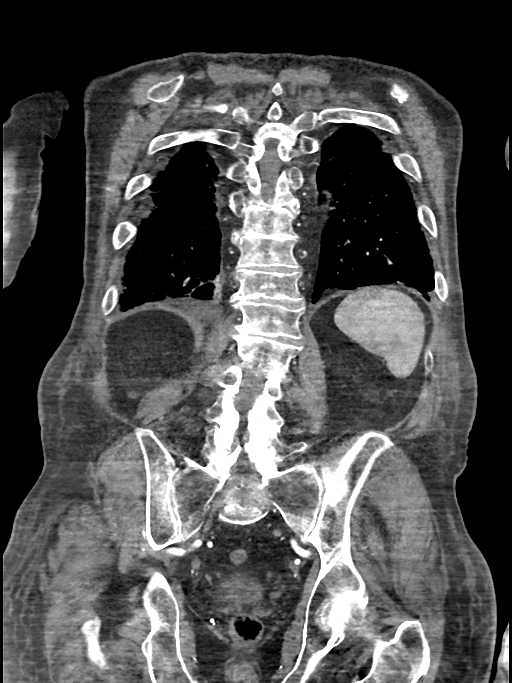

[13 of 46 positions shown; findings below may reference images not displayed]

FINDINGS: CTA CHEST FINDINGS

Cardiovascular:

--Heart: The heart size is mildly enlarged. There is
smallpericardial effusion.

--Aorta: The course and caliber of the thoracic aorta are normal.
There is scattered moderate aortic atherosclerotic calcification.
Precontrast images show no aortic intramural hematoma. There is no
blood pool, dissection or penetrating ulcer demonstrated on arterial
phase postcontrast imaging. There is a conventional 3 vessel aortic
arch branching pattern. The proximal arch vessels are widely patent.
Coronary artery calcifications are seen.

--Pulmonary Arteries: Contrast timing is optimized for preferential
opacification of the aorta. Within that limitation, normal central
pulmonary arteries.

Mediastinum/Nodes: No mediastinal, hilar or axillary
lymphadenopathy. The visualized thyroid and thoracic esophageal
course are unremarkable.

Lungs/Pleura: Mildly increased interstitial markings and tree-in-bud
opacities seen at both lung bases, left greater than right. There is
also a small amount of streaky opacity at both lung bases. No large
airspace consolidation or pleural effusion.

Musculoskeletal: No chest wall abnormality. No acute osseous
findings. There is diffuse osteopenia. Degenerative changes seen in
the thoracic spine.

Review of the MIP images confirms the above findings.

CTA ABDOMEN AND PELVIS FINDINGS

VASCULAR

Aorta: Normal caliber aorta without aneurysm, dissection, vasculitis
or hemodynamically significant stenosis. There is scattered moderate
aortic atherosclerosis.

Celiac: No aneurysm, dissection. There is mild stenosis seen at the
origin of the celiac axis due to atherosclerosis. Normal branching
pattern

SMA: There is mild stenosis seen at the origin of the SMA due to
atherosclerosis.

Renals: Single renal arteries bilaterally. There is moderate
stenosis seen at the proximal left renal artery and origin of the
right renal artery for due to atherosclerosis.

IMA: There is focal stenosis seen at the IMA due to atherosclerosis,
however it is patent.

Inflow: No aneurysm, stenosis or dissection.

Veins: Normal course and caliber of the major veins. Assessment is
otherwise limited by the arterial dominant contrast phase.

Review of the MIP images confirms the above findings.

NON-VASCULAR

Hepatobiliary: Normal hepatic contours and density. No visible
biliary dilatation. Normal gallbladder.

Pancreas: Normal contours without ductal dilatation. No
peripancreatic fluid collection.

Spleen: Normal arterial phase splenic enhancement pattern.

Adrenals/Urinary Tract:

--Adrenal glands: Normal.

--Right kidney/ureter: No hydronephrosis or perinephric stranding.
No nephrolithiasis. No obstructing ureteral stones.

--Left kidney/ureter: There is a large multilocular septated lesion
seen within the upper pole of the left kidney measuring 6.9 cm.
There is calcifications seen within the internal septae a. No
hydronephrosis or shadowing stones. Of

--Urinary bladder: Unremarkable.

Stomach/Bowel:

--Stomach/Duodenum: No hiatal hernia or other gastric abnormality.
Normal duodenal course and caliber.

--Small bowel: No dilatation or inflammation.

--Colon: Scattered colonic diverticula are noted without
diverticulitis.

--Appendix: Normal.

Lymphatic:  No abdominal or pelvic lymphadenopathy.

Reproductive: No free fluid in the pelvis.

Musculoskeletal. There is diffuse osteopenia. There is age
indeterminate superior compression deformity of the T12 vertebral
body with approximately 30% loss in superior vertebral body height.
There is also a slight superior compression deformity of the L2
vertebral body with 25% loss of vertebral body height. No
retropulsion of fragments is seen. There is a mild levoconvex
scoliotic curvature of the lumbar spine.

Other: None.

Review of the MIP images confirms the above findings.
IMPRESSION: 1. No acute intrathoracic or abdominal aortic abnormality.
2.  Aortic Atherosclerosis ([BU]-[BU]).
3. mild-to-moderate stenosis due to atherosclerosis at the origin of
the SMA, celiac, IMA and bilateral renal arteries.
4. Mild cardiomegaly and small pericardial effusion
5. Mildly increased interstitial lung markings and tree-in-bud
opacities at both lung bases. This is non-specific could be due to
chronic lung changes and/or viral infectious etiology.
6. Age-indeterminate compression deformities of T12 and L2 with
25-30% loss in vertebral body height. No retropulsion of fragments
or severe canal stenosis.
7. Diverticulosis without diverticulitis.
8. Multilocular cystic lesion measuring 6.9 cm, likely minimally
complicated renal cyst. No further evaluation is recommended. This
recommendation follows ACR consensus guidelines: Management of the
Incidental Renal Mass on CT: A White Paper of the ACR Incidental
Findings Committee. [HOSPITAL] [BU];[DATE].

## 2019-05-14 MED ORDER — DILTIAZEM HCL 25 MG/5ML IV SOLN
10.0000 mg | Freq: Once | INTRAVENOUS | Status: AC
  Administered 2019-05-14: 10 mg via INTRAVENOUS
  Filled 2019-05-14: qty 5

## 2019-05-14 MED ORDER — SODIUM CHLORIDE 0.9 % IV BOLUS
500.0000 mL | Freq: Once | INTRAVENOUS | Status: AC
  Administered 2019-05-14: 500 mL via INTRAVENOUS

## 2019-05-14 MED ORDER — HYDROMORPHONE HCL 1 MG/ML IJ SOLN
1.0000 mg | Freq: Once | INTRAMUSCULAR | Status: AC
  Administered 2019-05-14: 1 mg via INTRAVENOUS
  Filled 2019-05-14: qty 1

## 2019-05-14 MED ORDER — IOHEXOL 350 MG/ML SOLN
100.0000 mL | Freq: Once | INTRAVENOUS | Status: AC | PRN
  Administered 2019-05-14: 100 mL via INTRAVENOUS

## 2019-05-14 MED ORDER — MORPHINE SULFATE (PF) 2 MG/ML IV SOLN: 0.5000 mg | INTRAVENOUS | Status: DC | PRN

## 2019-05-14 MED ORDER — WARFARIN - PHARMACIST DOSING INPATIENT: Freq: Every day | Status: DC

## 2019-05-14 MED ORDER — IBUPROFEN 400 MG PO TABS
400.0000 mg | ORAL_TABLET | ORAL | Status: DC | PRN
  Administered 2019-05-15: 400 mg via ORAL
  Filled 2019-05-14: qty 1

## 2019-05-14 MED ORDER — WARFARIN SODIUM 2.5 MG PO TABS
2.5000 mg | ORAL_TABLET | Freq: Once | ORAL | Status: AC
  Administered 2019-05-14: 2.5 mg via ORAL
  Filled 2019-05-14: qty 1

## 2019-05-14 NOTE — H&P (Signed)
History and Physical    Adam Richards [id number] DOB: 09/23/1926 DOA: 05/14/2019  PCP: Wenda Low, MD  [facility] from: [facility].  Lives alone.  I have personally briefly reviewed patient's old medical records in [place]  Chief Complaint: Lower back pain  HPI: Adam Richards is a 85 y.o. male with medical history significant of history of vertebral compression fracture, atrial fibrillation on Coumadin, hypertension who presents with concerns of severe lower back pain. Symptoms started about 2 days ago early in the morning.  The day prior he had been doing back exercises that involved arching his back.  He does these exercises occasionally but did more than usual on Saturday.  The pain is constant but he is unsure of its characteristic or whether he gets better or worse with movement.  He denies any lower extremity weakness.  Denies any saddle anesthesia.  No incontinence. He also reports sensation of palpitations today but no chest pain.  Also has shortness of breath especially with exertion for the past 10 days.  Denies any orthopnea or paroxysmal nocturnal dyspnea.  However notes increased lower extremity edema. Reports compliance with his medication.  He endorsed occasional cigar use, rare alcohol use and denies illicit drug use.  ED Course: He was afebrile and normotensive on 2L via [place] likely for comfort as no hypoxia was documented.  He was found to be in atrial fibrillation with RVR with rates up to 160 which improved following 10 mg of IV diltiazem down to a rate of about 110 at the time of my evaluation. CBC showed no leukocytosis, mild anemia with hemoglobin of 12.2.  Lactic acid of 1.7. Sodium of 127.  Potassium of 4.1.  Normal creatinine of 0.80.  Normal LFTs. Negative UA.  CT angio chest/abdomen and pelvis was obtained for concerns of dissection which was negative.  He was given 2mg  of Dilaudid, 500 cc bolus.  Review of Systems:    Constitutional: No Weight Change, No Fever ENT/Mouth: No sore throat, No Rhinorrhea Eyes: No Vision Changes Cardiovascular: No Chest Pain, + SOB, , + Palpitations Respiratory: No Cough Gastrointestinal: No Nausea, No Vomiting, No Diarrhea,  Genitourinary: no Urinary Incontinence, Musculoskeletal: No Arthralgias, No Myalgias Skin: No Skin Lesions, No Pruritus, Neuro: no Weakness, No Numbness,   Psych: No Anxiety/Panic, no decrease appetite Heme/Lymph: No Bruising, No Bleeding  Past Medical History:  Diagnosis Date   A-fib ([place])    Hypertension     Past Surgical History:  Procedure Laterality Date   KNEE SURGERY     "removed crystals"   TONSILLECTOMY AND ADENOIDECTOMY     UMBILICAL HERNIA REPAIR       reports that he has been smoking cigarettes. He has never used smokeless tobacco. He reports that he does not drink alcohol or use drugs.  No Known Allergies  Family History  Problem Relation Age of Onset   Heart failure Mother    Cancer Father    Prostate cancer Father    Cancer Brother    Stroke Maternal Grandmother    Tuberculosis Maternal Grandfather      Prior to Admission medications   Medication Sig Start Date End Date Taking? Authorizing Provider  amLODipine (NORVASC) 5 MG tablet Take 1 tablet by mouth daily. 04/08/15   [provider]  [place] 125 MCG tablet Take 1 tablet by mouth daily. 04/28/15   [provider]  docusate calcium (SURFAK) 240 MG capsule Take 240 mg by mouth daily.  [provider]  dutasteride (AVODART) 0.5 MG capsule Take 1 capsule by mouth daily. 04/08/15   [provider]  lovastatin (MEVACOR) 20 MG tablet Take 20 mg by mouth daily. 05/20/15   [provider]  metoprolol tartrate (LOPRESSOR) 25 MG tablet Take 1 tablet by mouth 2 (two) times daily. 04/27/16   [provider]  tamsulosin (FLOMAX) 0.4 MG CAPS capsule Take 1 capsule by mouth daily. 05/12/15   [provider]  triamterene-hydrochlorothiazide (MAXZIDE-25) 37.5-25 MG tablet Take 1 tablet by mouth daily. 04/08/15   [provider]  warfarin (COUMADIN) 2.5 MG tablet Take as directed 06/06/15   [provider]    Physical Exam: Vitals:   05/14/19 2000 05/14/19 2100 05/14/19 2115 05/14/19 2229  BP: 140/78 (!) 148/85 (!) 144/95   Pulse: (!) 54 (!) 53 (!) 55   Resp: (!) 30 11 11    SpO2: 97% 96% 95%   Weight:    87.4 kg  Height:    5\' 10"  (1.778 m)    Constitutional: NAD, calm, comfortable, sitting upright in bed Vitals:   05/14/19 2000 05/14/19 2100 05/14/19 2115 05/14/19 2229  BP: 140/78 (!) 148/85 (!) 144/95   Pulse: (!) 54 (!) 53 (!) 55   Resp: (!) 30 11 11    SpO2: 97% 96% 95%   Weight:    87.4 kg  Height:    5\' 10"  (1.778 m)   Eyes: PERRL, lids and conjunctivae normal ENMT: Mucous membranes are moist.  Neck: normal, supple Respiratory: clear to auscultation bilaterally, no wheezing, no crackles. Normal respiratory effort on 2L via [place].  Cardiovascular: Irregularly irregular with HR of 110, no murmurs / rubs / gallops. No extremity edema. 2+ pedal pulses.  Abdomen: no tenderness, no masses palpated.  Bowel sounds positive.  Musculoskeletal: no clubbing / cyanosis. No joint deformity upper and lower extremities.  no contractures. Normal muscle tone.  Back: No obvious deformities or dislocation.  No skin changes.  Patient points to location of pain around L1-L2 but no pain with palpation of the vertebra or  paraspinal musculature Skin:erythematous skin on forehead and brown papular rash.  Neurologic: CN 2-12 grossly intact. Sensation intact. Strength 5/5 in all 4.  Psychiatric: Normal judgment and insight. Alert and oriented x 3. Normal mood.     Labs on Admission: I have personally reviewed following labs and imaging studies  CBC: Recent Labs  Lab 05/14/19 1822  WBC 8.8  HGB 12.2*  HCT 34.7*  MCV 95.1  PLT 99991111   Basic Metabolic Panel: Recent Labs  Lab  05/14/19 1822  NA 127*  K 4.1  CL 93*  CO2 24  GLUCOSE 98  BUN 13  CREATININE 0.80  CALCIUM 9.3   GFR: Estimated Creatinine Clearance: 63.4 mL/min (by C-G formula based on SCr of 0.8 mg/dL). Liver Function Tests: Recent Labs  Lab 05/14/19 1822  AST 28  ALT 20  ALKPHOS 93  BILITOT 1.0  PROT 6.7  ALBUMIN 4.1   Recent Labs  Lab 05/14/19 1822  LIPASE 24   No results for input(s): AMMONIA in the last 168 hours. Coagulation Profile: Recent Labs  Lab 05/14/19 1900  INR 1.9*   Cardiac Enzymes: No results for input(s): CKTOTAL, CKMB, CKMBINDEX, TROPONINI in the last 168 hours. BNP (last 3 results) No results for input(s): PROBNP in the last 8760 hours. HbA1C: No results for input(s): HGBA1C in the last 72 hours. CBG: No results for input(s): GLUCAP in the last 168 hours. Lipid  Profile: No results for input(s): CHOL, HDL, LDLCALC, TRIG, CHOLHDL, LDLDIRECT in the last 72 hours. Thyroid Function Tests: No results for input(s): TSH, T4TOTAL, FREET4, T3FREE, THYROIDAB in the last 72 hours. Anemia Panel: No results for input(s): VITAMINB12, FOLATE, FERRITIN, TIBC, IRON, RETICCTPCT in the last 72 hours. Urine analysis:    Component Value Date/Time   COLORURINE YELLOW 05/14/2019 1844   APPEARANCEUR CLEAR 05/14/2019 1844   LABSPEC 1.011 05/14/2019 1844   PHURINE 7.0 05/14/2019 1844   GLUCOSEU NEGATIVE 05/14/2019 1844   HGBUR NEGATIVE 05/14/2019 1844   BILIRUBINUR NEGATIVE 05/14/2019 1844   KETONESUR 5 (A) 05/14/2019 1844   PROTEINUR NEGATIVE 05/14/2019 1844   NITRITE NEGATIVE 05/14/2019 1844   LEUKOCYTESUR NEGATIVE 05/14/2019 1844    Radiological Exams on Admission: XR Chest Single View  Result Date: 05/14/2019 CLINICAL DATA:  85 year old male with tachycardia. EXAM: PORTABLE CHEST 1 VIEW COMPARISON:  Chest radiograph dated 05/20/2018. FINDINGS: Minimal left lung base atelectasis. No focal consolidation, pleural effusion, or pneumothorax. Stable mild cardiomegaly.  Atherosclerotic calcification of the aorta. No acute osseous pathology. IMPRESSION: 1. No active disease. 2. Stable mild cardiomegaly. Electronically Signed   By: Anner Crete M.D.   On: 05/14/2019 18:52   CT Angio Chest/Abd/Pel for Dissection W and/or W/WO  Result Date: 05/14/2019 CLINICAL DATA:  Confusion lower back pain and abdominal pain EXAM: CT ANGIOGRAPHY CHEST, ABDOMEN AND PELVIS TECHNIQUE: Multidetector CT imaging through the chest, abdomen and pelvis was performed using the standard protocol during bolus administration of intravenous contrast. Multiplanar reconstructed images and MIPs were obtained and reviewed to evaluate the vascular anatomy. CONTRAST:  148mL OMNIPAQUE IOHEXOL 350 MG/ML SOLN COMPARISON:  None. FINDINGS: CTA CHEST FINDINGS Cardiovascular: --Heart: The heart size is mildly enlarged. There is smallpericardial effusion. --Aorta: The course and caliber of the thoracic aorta are normal. There is scattered moderate aortic atherosclerotic calcification. Precontrast images show no aortic intramural hematoma. There is no blood pool, dissection or penetrating ulcer demonstrated on arterial phase postcontrast imaging. There is a conventional 3 vessel aortic arch branching pattern. The proximal arch vessels are widely patent. Coronary artery calcifications are seen. --Pulmonary Arteries: Contrast timing is optimized for preferential opacification of the aorta. Within that limitation, normal central pulmonary arteries. Mediastinum/Nodes: No mediastinal, hilar or axillary lymphadenopathy. The visualized thyroid and thoracic esophageal course are unremarkable. Lungs/Pleura: Mildly increased interstitial markings and tree-in-bud opacities seen at both lung bases, left greater than right. There is also a small amount of streaky opacity at both lung bases. No large airspace consolidation or pleural effusion. Musculoskeletal: No chest wall abnormality. No acute osseous findings. There is diffuse  osteopenia. Degenerative changes seen in the thoracic spine. Review of the MIP images confirms the above findings. CTA ABDOMEN AND PELVIS FINDINGS VASCULAR Aorta: Normal caliber aorta without aneurysm, dissection, vasculitis or hemodynamically significant stenosis. There is scattered moderate aortic atherosclerosis. Celiac: No aneurysm, dissection. There is mild stenosis seen at the origin of the celiac axis due to atherosclerosis. Normal branching pattern SMA: There is mild stenosis seen at the origin of the SMA due to atherosclerosis. Renals: Single renal arteries bilaterally. There is moderate stenosis seen at the proximal left renal artery and origin of the right renal artery for due to atherosclerosis. IMA: There is focal stenosis seen at the IMA due to atherosclerosis, however it is patent. Inflow: No aneurysm, stenosis or dissection. Veins: Normal course and caliber of the major veins. Assessment is otherwise limited by the arterial dominant contrast phase. Review of the MIP images confirms  the above findings. NON-VASCULAR Hepatobiliary: Normal hepatic contours and density. No visible biliary dilatation. Normal gallbladder. Pancreas: Normal contours without ductal dilatation. No peripancreatic fluid collection. Spleen: Normal arterial phase splenic enhancement pattern. Adrenals/Urinary Tract: --Adrenal glands: Normal. --Right kidney/ureter: No hydronephrosis or perinephric stranding. No nephrolithiasis. No obstructing ureteral stones. --Left kidney/ureter: There is a large multilocular septated lesion seen within the upper pole of the left kidney measuring 6.9 cm. There is calcifications seen within the internal septae a. No hydronephrosis or shadowing stones. Of --Urinary bladder: Unremarkable. Stomach/Bowel: --Stomach/Duodenum: No hiatal hernia or other gastric abnormality. Normal duodenal course and caliber. --Small bowel: No dilatation or inflammation. --Colon: Scattered colonic diverticula are noted  without diverticulitis. --Appendix: Normal. Lymphatic:  No abdominal or pelvic lymphadenopathy. Reproductive: No free fluid in the pelvis. Musculoskeletal. There is diffuse osteopenia. There is age indeterminate superior compression deformity of the T12 vertebral body with approximately 30% loss in superior vertebral body height. There is also a slight superior compression deformity of the L2 vertebral body with 25% loss of vertebral body height. No retropulsion of fragments is seen. There is a mild levoconvex scoliotic curvature of the lumbar spine. Other: None. Review of the MIP images confirms the above findings. IMPRESSION: 1. No acute intrathoracic or abdominal aortic abnormality. 2.  Aortic Atherosclerosis (ICD10-I70.0). 3. mild-to-moderate stenosis due to atherosclerosis at the origin of the SMA, celiac, IMA and bilateral renal arteries. 4. Mild cardiomegaly and small pericardial effusion 5. Mildly increased interstitial lung markings and tree-in-bud opacities at both lung bases. This is non-specific could be due to chronic lung changes and/or viral infectious etiology. 6. Age-indeterminate compression deformities of T12 and L2 with 25-30% loss in vertebral body height. No retropulsion of fragments or severe canal stenosis. 7. Diverticulosis without diverticulitis. 8. Multilocular cystic lesion measuring 6.9 cm, likely minimally complicated renal cyst. No further evaluation is recommended. This recommendation follows ACR consensus guidelines: Management of the Incidental Renal Mass on CT: A White Paper of the ACR Incidental Findings Committee. J Am Coll Radiol [phone]. Electronically Signed   By: Prudencio Pair M.D.   On: 05/14/2019 20:49    EKG: Independently reviewed.   Assessment/Plan   Lumbar back pain -CTA angio chest/abdomen/pelvis negative for dissection.  There is finding of an age indeterminate T12 and L2 compression deformity. -Suspect pain is MSK related since patient did more  exercise than usual prior to start of pain. -As needed ibuprofen for mild pain, PRN 0.5 mg IV morphine for moderate pain  Permanent Atrial fibrillation with RVR -Patient received 10 mg of IV diltiazem in the ED and is now rate controlled - admit to cardiac telemetry and monitor -Continue metoprolol and digoxin  Subtherapeutic INR -Warfarin dosing per pharmacy  Hyponatremia -Received 500 cc bolus in the ED -Follow BMP in the morning  Medication reconciliation was incomplete at the time of admission and will follow.  DVT prophylaxis:.Warfarin  Code Status: Full Family Communication: Plan discussed with patient at bedside  disposition Plan: Home with observation Consults called:  Admission status: Observation    Deno Sida T Vince Ainsley DO [facility]   If 7PM-7AM, please contact night-coverage [email or web address] Password [facility]  05/14/2019, 10:42 PM

## 2019-05-14 NOTE — ED Provider Notes (Signed)
[facility] Emergency Department Provider Note MRN:  [id number]  Arrival date & time: 05/14/19     Chief Complaint   Back pain History of Present Illness   Adam Richards is a 85 y.o. year-old male with a history of A. fib, hypertension presenting to the ED with chief complaint of back pain.  Severe midline low back pain.  Began as mild pain after some exercises yesterday, became much worse over the past few hours.  Denies numbness or weakness to the arms or legs, no bowel or bladder dysfunction, denies chest pain or shortness of breath, no abdominal pain, no fever, no headache, no vision change.  Worse with motion, constant.  Review of Systems  A complete 10 system review of systems was obtained and all systems are negative except as noted in the HPI and PMH.   Patient's Health History    Past Medical History:  Diagnosis Date  . A-fib ([place])   . Hypertension     Past Surgical History:  Procedure Laterality Date  . KNEE SURGERY     "removed crystals"  . TONSILLECTOMY AND ADENOIDECTOMY    . UMBILICAL HERNIA REPAIR      Family History  Problem Relation Age of Onset  . Heart failure Mother   . Cancer Father   . Prostate cancer Father   . Cancer Brother   . Stroke Maternal Grandmother   . Tuberculosis Maternal Grandfather     Social History   Socioeconomic History  . Marital status: Widowed    Spouse name: Not on file  . Number of children: 4  . Years of education: Not on file  . Highest education level: Not on file  Occupational History  . Not on file  Tobacco Use  . Smoking status: Current Some Day Smoker    Types: Cigarettes  . Smokeless tobacco: Never Used  . Tobacco comment: Rares cigars ,not cigarettes  Substance and Sexual Activity  . Alcohol use: No    Alcohol/week: 0.0 standard drinks  . Drug use: No  . Sexual activity: Not on file  Other Topics Concern  . Not on file  Social History Narrative  . Not on file   Social  Determinants of Health   Financial Resource Strain:   . Difficulty of Paying Living Expenses: Not on file  Food Insecurity:   . Worried About Charity fundraiser in the Last Year: Not on file  . Ran Out of Food in the Last Year: Not on file  Transportation Needs:   . Lack of Transportation (Medical): Not on file  . Lack of Transportation (Non-Medical): Not on file  Physical Activity:   . Days of Exercise per Week: Not on file  . Minutes of Exercise per Session: Not on file  Stress:   . Feeling of Stress : Not on file  Social Connections:   . Frequency of Communication with Friends and Family: Not on file  . Frequency of Social Gatherings with Friends and Family: Not on file  . Attends Religious Services: Not on file  . Active Member of Clubs or Organizations: Not on file  . Attends Archivist Meetings: Not on file  . Marital Status: Not on file  Intimate Partner Violence:   . Fear of Current or Ex-Partner: Not on file  . Emotionally Abused: Not on file  . Physically Abused: Not on file  . Sexually Abused: Not on file     Physical Exam  Vital Signs and  Nursing Notes reviewed Vitals:   05/14/19 1815  BP: (!) 154/107  Pulse: (!) 143  Resp: (!) 30  SpO2: 96%    CONSTITUTIONAL: Well-appearing, in moderate distress due to pain NEURO:  Alert and oriented x 3, no focal deficits EYES:  eyes equal and reactive ENT/NECK:  no LAD, no JVD CARDIO: Tachycardic rate, well-perfused, normal S1 and S2 PULM:  CTAB no wheezing or rhonchi GI/GU:  normal bowel sounds, non-distended, tender to palpation to the periumbilical region MSK/SPINE:  No gross deformities, no edema SKIN:  no rash, atraumatic PSYCH:  Appropriate speech and behavior  Diagnostic and Interventional Summary    EKG Interpretation  Date/Time:  Monday May 14 2019 18:10:36 EST Ventricular Rate:  132 PR Interval:    QRS Duration: 104 QT Interval:  335 QTC Calculation: 497 R Axis:   116 Text  Interpretation: Sinus tachycardia with irregular rate Low voltage, precordial leads ST depression, probably rate related No previous ECGs available Confirmed by Gerlene Fee [phone]) on 05/14/2019 6:24:58 PM Also confirmed by Gerlene Fee [phone])  on 05/14/2019 6:55:56 PM      Labs Reviewed  CBC - Abnormal; Notable for the following components:      Result Value   RBC 3.65 (*)    Hemoglobin 12.2 (*)    HCT 34.7 (*)    All other components within normal limits  COMPREHENSIVE METABOLIC PANEL - Abnormal; Notable for the following components:   Sodium 127 (*)    Chloride 93 (*)    All other components within normal limits  URINALYSIS, ROUTINE W REFLEX MICROSCOPIC - Abnormal; Notable for the following components:   Ketones, ur 5 (*)    All other components within normal limits  PROTIME-INR - Abnormal; Notable for the following components:   Prothrombin Time 21.7 (*)    INR 1.9 (*)    All other components within normal limits  LIPASE, BLOOD  LACTIC ACID, PLASMA    CT Angio Chest/Abd/Pel for Dissection W and/or W/WO  Final Result    XR Chest Single View  Final Result      Medications  HYDROmorphone (DILAUDID) injection 1 mg (1 mg Intravenous Given 05/14/19 1849)  diltiazem (CARDIZEM) injection 10 mg (10 mg Intravenous Given 05/14/19 1931)  HYDROmorphone (DILAUDID) injection 1 mg (1 mg Intravenous Given 05/14/19 1956)  iohexol (OMNIPAQUE) 350 MG/ML injection 100 mL (100 mLs Intravenous Contrast Given 05/14/19 2034)     Procedures  /  Critical Care Ultrasound ED Abd  Date/Time: 05/14/2019 6:27 PM Performed by: Maudie Flakes, MD Authorized by: Maudie Flakes, MD   Procedure details:    Indications: abdominal pain     Assessment for:  AAA   Aorta:  Not visualized   Images: archived   Study Limitations: body habitus, bowel gas and patient compliance  .Critical Care Performed by: Maudie Flakes, MD Authorized by: Maudie Flakes, MD   Critical care provider statement:     Critical care time (minutes):  33   Critical care was necessary to treat or prevent imminent or life-threatening deterioration of the following conditions: Atrial fibrillation with rapid ventricular response.   Critical care was time spent personally by me on the following activities:  Discussions with consultants, evaluation of patient's response to treatment, examination of patient, ordering and performing treatments and interventions, ordering and review of laboratory studies, ordering and review of radiographic studies, pulse oximetry, re-evaluation of patient's condition, obtaining history from patient or surrogate and review of old charts  ED Course and Medical Decision Making  I have reviewed the triage vital signs and the nursing notes.  Pertinent labs & imaging results that were available during my care of the patient were reviewed by me and considered in my medical decision making (see below for details).     No red flag symptoms to suggest myelopathy, most likely musculoskeletal back pain, however patient is in distress due to the pain, he has abdominal tenderness, there is concern for aortic pathology, AAA versus dissection.  Bedside ultrasound limited by habitus and patient complaints, will obtain CT imaging.  Patient required multiple doses of Dilaudid to comply with CT imaging.  CT is without aortic pathology, age indeterminant compression fractures suspected to be the underlying cause of patient's pain.  Patient is 85 years old, is anticoagulated, and lives alone, labs reveal hyponatremia, will consult for admission.  Barth Kirks. Sedonia Small, [place] [email or web address]  Final Clinical Impressions(s) / ED Diagnoses     ICD-10-CM   1. Compression fracture of body of thoracic vertebra (HCC)  S22.000A   2. Tachycardia  R00.0 XR Chest Single View    XR Chest Single View  3. Compression fracture of L2 vertebra, initial  encounter ([place])  S32.020A   4. Atrial fibrillation with rapid ventricular response (HCC)  I48.91   5. Acute midline low back pain without sciatica  M54.5     ED Discharge Orders    None       Discharge Instructions Discussed with and Provided to Patient:   Discharge Instructions   None       Maudie Flakes, MD 05/14/19 2118

## 2019-05-14 NOTE — ED Notes (Signed)
Nurse Navigator called pt son to update pt.

## 2019-05-14 NOTE — ED Notes (Signed)
Patient son calling asking for an update Tayquan Decou [phone]

## 2019-05-14 NOTE — Progress Notes (Signed)
[place] for Warfarin  Indication: atrial fibrillation  No Known Allergies  Patient Measurements: Height: 5\' 10"  (177.8 cm) Weight: 192 lb 10.9 oz (87.4 kg) IBW/kg (Calculated) : 73  Vital Signs: BP: 144/95 (12/21 2115) Pulse Rate: 55 (12/21 2115)  Labs: Recent Labs    05/14/19 1822 05/14/19 1900  HGB 12.2*  --   HCT 34.7*  --   PLT 186  --   LABPROT  --  21.7*  INR  --  1.9*  CREATININE 0.80  --     Estimated Creatinine Clearance: 63.4 mL/min (by C-G formula based on SCr of 0.8 mg/dL).   Medical History: Past Medical History:  Diagnosis Date  . A-fib ([place])   . Hypertension     Assessment: 85 y/o M in the ED with hyponatremia. On warfarin PTA for afib. INR is just below goal at 1.9.  Goal of Therapy:  INR 2-3 Monitor platelets by anticoagulation protocol: Yes   Plan:  Warfarin 2.5 mg PO x 1 now  Daily PT/INR Monitor for bleeding  Narda Bonds, PharmD, BCPS Clinical Pharmacist Phone: [phone]

## 2019-05-14 NOTE — ED Triage Notes (Addendum)
Per EMS: Pt from [facility] with mild confusion, A-Fib RVR, lower back pain, abdominal distention, and SHOB.  Pt has chronic back pain, stated he has disc compression.  Pt typically A/O x4.  Lower extremity swelling that has increased from baseline.   EMS vitals:  BP 155/112 HR 124 A-Fib CBG 99 RR 22-24 Sp02 98% RA.

## 2019-05-14 NOTE — ED Notes (Signed)
Pt appears very uncomfortable, stating he is unable to lie flat due to pain. Called CT to see about propping his legs up for comfort for scan to be completed. Pt willing to try to lie flat; next on list for imaging.  Pt in afib after cardizem IVP, more rate controlled at present, pulse 87

## 2019-05-15 DIAGNOSIS — M549 Dorsalgia, unspecified: Secondary | ICD-10-CM | POA: Diagnosis present

## 2019-05-15 DIAGNOSIS — D649 Anemia, unspecified: Secondary | ICD-10-CM | POA: Diagnosis present

## 2019-05-15 DIAGNOSIS — I4821 Permanent atrial fibrillation: Secondary | ICD-10-CM | POA: Diagnosis present

## 2019-05-15 DIAGNOSIS — I10 Essential (primary) hypertension: Secondary | ICD-10-CM | POA: Diagnosis present

## 2019-05-15 DIAGNOSIS — M5134 Other intervertebral disc degeneration, thoracic region: Secondary | ICD-10-CM | POA: Diagnosis present

## 2019-05-15 DIAGNOSIS — Z79899 Other long term (current) drug therapy: Secondary | ICD-10-CM | POA: Diagnosis not present

## 2019-05-15 DIAGNOSIS — M4850XA Collapsed vertebra, not elsewhere classified, site unspecified, initial encounter for fracture: Secondary | ICD-10-CM | POA: Diagnosis present

## 2019-05-15 DIAGNOSIS — I482 Chronic atrial fibrillation, unspecified: Secondary | ICD-10-CM | POA: Diagnosis not present

## 2019-05-15 DIAGNOSIS — M48061 Spinal stenosis, lumbar region without neurogenic claudication: Secondary | ICD-10-CM | POA: Diagnosis present

## 2019-05-15 DIAGNOSIS — Z20828 Contact with and (suspected) exposure to other viral communicable diseases: Secondary | ICD-10-CM | POA: Diagnosis present

## 2019-05-15 DIAGNOSIS — E871 Hypo-osmolality and hyponatremia: Secondary | ICD-10-CM

## 2019-05-15 DIAGNOSIS — M79605 Pain in left leg: Secondary | ICD-10-CM | POA: Diagnosis present

## 2019-05-15 DIAGNOSIS — M545 Low back pain, unspecified: Secondary | ICD-10-CM | POA: Diagnosis present

## 2019-05-15 DIAGNOSIS — R791 Abnormal coagulation profile: Secondary | ICD-10-CM | POA: Diagnosis present

## 2019-05-15 DIAGNOSIS — Z8249 Family history of ischemic heart disease and other diseases of the circulatory system: Secondary | ICD-10-CM | POA: Diagnosis not present

## 2019-05-15 DIAGNOSIS — M25552 Pain in left hip: Secondary | ICD-10-CM | POA: Diagnosis not present

## 2019-05-15 DIAGNOSIS — Z7901 Long term (current) use of anticoagulants: Secondary | ICD-10-CM | POA: Diagnosis not present

## 2019-05-15 DIAGNOSIS — M4856XA Collapsed vertebra, not elsewhere classified, lumbar region, initial encounter for fracture: Secondary | ICD-10-CM | POA: Diagnosis present

## 2019-05-15 DIAGNOSIS — M4854XA Collapsed vertebra, not elsewhere classified, thoracic region, initial encounter for fracture: Secondary | ICD-10-CM | POA: Diagnosis present

## 2019-05-15 DIAGNOSIS — Z8042 Family history of malignant neoplasm of prostate: Secondary | ICD-10-CM | POA: Diagnosis not present

## 2019-05-15 DIAGNOSIS — F17290 Nicotine dependence, other tobacco product, uncomplicated: Secondary | ICD-10-CM | POA: Diagnosis present

## 2019-05-15 DIAGNOSIS — Z823 Family history of stroke: Secondary | ICD-10-CM | POA: Diagnosis not present

## 2019-05-15 LAB — CBC
HCT: 32.4 % — ABNORMAL LOW (ref 39.0–52.0)
Hemoglobin: 11 g/dL — ABNORMAL LOW (ref 13.0–17.0)
MCH: 33.3 pg (ref 26.0–34.0)
MCHC: 34 g/dL (ref 30.0–36.0)
MCV: 98.2 fL (ref 80.0–100.0)
Platelets: 157 10*3/uL (ref 150–400)
RBC: 3.3 MIL/uL — ABNORMAL LOW (ref 4.22–5.81)
RDW: 14.2 % (ref 11.5–15.5)
WBC: 7.1 10*3/uL (ref 4.0–10.5)
nRBC: 0 % (ref 0.0–0.2)

## 2019-05-15 LAB — SODIUM, URINE, RANDOM: Sodium, Ur: 36 mmol/L

## 2019-05-15 LAB — BASIC METABOLIC PANEL
Anion gap: 10 (ref 5–15)
Anion gap: 10 (ref 5–15)
BUN: 12 mg/dL (ref 8–23)
BUN: 16 mg/dL (ref 8–23)
CO2: 24 mmol/L (ref 22–32)
CO2: 23 mmol/L (ref 22–32)
Calcium: 8.9 mg/dL (ref 8.9–10.3)
Calcium: 8.9 mg/dL (ref 8.9–10.3)
Chloride: 93 mmol/L — ABNORMAL LOW (ref 98–111)
Chloride: 90 mmol/L — ABNORMAL LOW (ref 98–111)
Creatinine, Ser: 0.67 mg/dL (ref 0.61–1.24)
Creatinine, Ser: 0.92 mg/dL (ref 0.61–1.24)
GFR calc Af Amer: >60 mL/min (ref >60)
GFR calc Af Amer: >60 mL/min (ref >60)
GFR calc non Af Amer: >60 mL/min (ref >60)
GFR calc non Af Amer: >60 mL/min (ref >60)
Glucose, Bld: 101 mg/dL — ABNORMAL HIGH (ref 70–99)
Glucose, Bld: 111 mg/dL — ABNORMAL HIGH (ref 70–99)
Potassium: 3.8 mmol/L (ref 3.5–5.1)
Potassium: 4.7 mmol/L (ref 3.5–5.1)
Sodium: 127 mmol/L — ABNORMAL LOW (ref 135–145)
Sodium: 123 mmol/L — ABNORMAL LOW (ref 135–145)

## 2019-05-15 LAB — PROTIME-INR
INR: 2 — ABNORMAL HIGH (ref 0.8–1.2)
Prothrombin Time: 22.7 seconds — ABNORMAL HIGH (ref 11.4–15.2)

## 2019-05-15 LAB — OSMOLALITY, URINE: Osmolality, Ur: 801 mOsm/kg (ref 300–900)

## 2019-05-15 LAB — SARS CORONAVIRUS 2 (TAT 6-24 HRS): SARS Coronavirus 2: NEGATIVE

## 2019-05-15 MED ORDER — WARFARIN SODIUM 2.5 MG PO TABS
2.5000 mg | ORAL_TABLET | Freq: Once | ORAL | Status: AC
  Administered 2019-05-15: 2.5 mg via ORAL
  Filled 2019-05-15: qty 1

## 2019-05-15 MED ORDER — TAMSULOSIN HCL 0.4 MG PO CAPS
0.4000 mg | ORAL_CAPSULE | Freq: Every day | ORAL | Status: DC
  Administered 2019-05-15 – 2019-05-19 (×5): 0.4 mg via ORAL
  Filled 2019-05-15 (×5): qty 1

## 2019-05-15 MED ORDER — METHOCARBAMOL 500 MG PO TABS
500.0000 mg | ORAL_TABLET | Freq: Four times a day (QID) | ORAL | Status: DC
  Administered 2019-05-15 – 2019-05-19 (×18): 500 mg via ORAL
  Filled 2019-05-15 (×18): qty 1

## 2019-05-15 MED ORDER — HYDROCODONE-ACETAMINOPHEN 5-325 MG PO TABS
1.0000 | ORAL_TABLET | Freq: Four times a day (QID) | ORAL | Status: DC | PRN
  Administered 2019-05-15 (×3): 1 via ORAL
  Administered 2019-05-16 – 2019-05-19 (×11): 2 via ORAL
  Filled 2019-05-15 (×2): qty 2
  Filled 2019-05-15: qty 1
  Filled 2019-05-15: qty 2
  Filled 2019-05-15 (×2): qty 1
  Filled 2019-05-15 (×2): qty 2
  Filled 2019-05-15 (×2): qty 1
  Filled 2019-05-15: qty 2
  Filled 2019-05-15 (×2): qty 1
  Filled 2019-05-15 (×3): qty 2

## 2019-05-15 MED ORDER — DOCUSATE SODIUM 100 MG PO CAPS
200.0000 mg | ORAL_CAPSULE | Freq: Every day | ORAL | Status: DC
  Administered 2019-05-15 – 2019-05-19 (×4): 200 mg via ORAL
  Filled 2019-05-15 (×4): qty 2

## 2019-05-15 MED ORDER — MORPHINE SULFATE (PF) 2 MG/ML IV SOLN
0.5000 mg | INTRAVENOUS | Status: DC | PRN
  Administered 2019-05-15 – 2019-05-18 (×3): 0.5 mg via INTRAVENOUS
  Filled 2019-05-15 (×3): qty 1

## 2019-05-15 MED ORDER — METOPROLOL TARTRATE 25 MG PO TABS
25.0000 mg | ORAL_TABLET | Freq: Two times a day (BID) | ORAL | Status: DC
  Administered 2019-05-15 – 2019-05-19 (×9): 25 mg via ORAL
  Filled 2019-05-15 (×9): qty 1

## 2019-05-15 MED ORDER — DIGOXIN 125 MCG PO TABS: 125.0000 ug | ORAL_TABLET | Freq: Every day | ORAL | Status: DC

## 2019-05-15 MED ORDER — DUTASTERIDE 0.5 MG PO CAPS
0.5000 mg | ORAL_CAPSULE | Freq: Every day | ORAL | Status: DC
  Administered 2019-05-15 – 2019-05-19 (×5): 0.5 mg via ORAL
  Filled 2019-05-15 (×5): qty 1

## 2019-05-15 NOTE — ED Notes (Signed)
ED TO INPATIENT HANDOFF REPORT  ED Nurse Name and Phone #: Jori Moll [phone]  S Name/Age/Gender Adam Richards 85 y.o. male Room/Bed: 014C/014C  Code Status   Code Status: Full Code  Home/SNF/Other Home Patient oriented to: self, place, time and situation Is this baseline? Yes   Triage Complete: Triage complete  Chief Complaint Back pain [M54.9]  Triage Note Per EMS: Pt from [place] with mild confusion, A-Fib RVR, lower back pain, abdominal distention, and SHOB.  Pt has chronic back pain, stated he has disc compression.  Pt typically A/O x4.  Lower extremity swelling that has increased from baseline.   EMS vitals:  BP 155/112 HR 124 A-Fib CBG 99 RR 22-24 Sp02 98% RA.     Allergies No Known Allergies  Level of Care/Admitting Diagnosis ED Disposition    ED Disposition Condition [facility] Area: [place] [100100]  Level of Care: Telemetry Cardiac [103]  I expect the patient will be discharged within 24 hours: No (not a candidate for 5C-Observation unit)  Covid Evaluation: Asymptomatic Screening Protocol (No Symptoms)  Diagnosis: Back pain [id number]  Admitting Physician: Orene Desanctis [id number]  Attending Physician: Orene Desanctis [id number]  PT Class (Do Not Modify): Observation [104]  PT Acc Code (Do Not Modify): Observation [10022]       B Medical/Surgery History Past Medical History:  Diagnosis Date  . A-fib ([place])   . Hypertension    Past Surgical History:  Procedure Laterality Date  . KNEE SURGERY     "removed crystals"  . TONSILLECTOMY AND ADENOIDECTOMY    . UMBILICAL HERNIA REPAIR       A IV Location/Drains/Wounds Patient Lines/Drains/Airways Status   Active Line/Drains/Airways    Name:   Placement date:   Placement time:   Site:   Days:   Peripheral IV 05/14/19 Left Antecubital   05/14/19    1813    Antecubital   1          Intake/Output Last 24 hours  Intake/Output Summary (Last 24 hours) at 05/15/2019  0107 Last data filed at 05/14/2019 1813 Gross per 24 hour  Intake --  Output 250 ml  Net -250 ml    Labs/Imaging Results for orders placed or performed during the hospital encounter of 05/14/19 (from the past 48 hour(s))  CBC     Status: Abnormal   Collection Time: 05/14/19  6:22 PM  Result Value Ref Range   WBC 8.8 4.0 - 10.5 K/uL   RBC 3.65 (L) 4.22 - 5.81 MIL/uL   Hemoglobin 12.2 (L) 13.0 - 17.0 g/dL   HCT 34.7 (L) 39.0 - 52.0 %   MCV 95.1 80.0 - 100.0 fL   MCH 33.4 26.0 - 34.0 pg   MCHC 35.2 30.0 - 36.0 g/dL   RDW 14.0 11.5 - 15.5 %   Platelets 186 150 - 400 K/uL   nRBC 0.0 0.0 - 0.2 %    Comment: Performed at [facility], [place] [address], [place] [address]  CMP     Status: Abnormal   Collection Time: 05/14/19  6:22 PM  Result Value Ref Range   Sodium 127 (L) 135 - 145 mmol/L   Potassium 4.1 3.5 - 5.1 mmol/L   Chloride 93 (L) 98 - 111 mmol/L   CO2 24 22 - 32 mmol/L   Glucose, Bld 98 70 - 99 mg/dL   BUN 13 8 - 23 mg/dL   Creatinine, Ser 0.80 0.61 - 1.24  mg/dL   Calcium 9.3 8.9 - 10.3 mg/dL   Total Protein 6.7 6.5 - 8.1 g/dL   Albumin 4.1 3.5 - 5.0 g/dL   AST 28 15 - 41 U/L   ALT 20 0 - 44 U/L   Alkaline Phosphatase 93 38 - 126 U/L   Total Bilirubin 1.0 0.3 - 1.2 mg/dL   GFR calc non Af Amer >60 >60 mL/min   GFR calc Af Amer >60 >60 mL/min   Anion gap 10 5 - 15    Comment: Performed at [facility], [place] [address], [place], [place] [address]  Lipase     Status: None   Collection Time: 05/14/19  6:22 PM  Result Value Ref Range   Lipase 24 11 - 51 U/L    Comment: Performed at [facility], [place] [address], [place], [place] [address]  Lactic acid     Status: None   Collection Time: 05/14/19  6:22 PM  Result Value Ref Range   Lactic Acid, Venous 1.7 0.5 - 1.9 mmol/L    Comment: Performed at [facility] [address] [place], [place] [address]  Urinalysis     Status: Abnormal   Collection Time: 05/14/19  6:44 PM  Result Value  Ref Range   Color, Urine YELLOW YELLOW   APPearance CLEAR CLEAR   Specific Gravity, Urine 1.011 1.005 - 1.030   pH 7.0 5.0 - 8.0   Glucose, UA NEGATIVE NEGATIVE mg/dL   Hgb urine dipstick NEGATIVE NEGATIVE   Bilirubin Urine NEGATIVE NEGATIVE   Ketones, ur 5 (A) NEGATIVE mg/dL   Protein, ur NEGATIVE NEGATIVE mg/dL   Nitrite NEGATIVE NEGATIVE   Leukocytes,Ua NEGATIVE NEGATIVE    Comment: Performed at [facility] [address], [place], [place] [address]  PT     Status: Abnormal   Collection Time: 05/14/19  7:00 PM  Result Value Ref Range   Prothrombin Time 21.7 (H) 11.4 - 15.2 seconds   INR 1.9 (H) 0.8 - 1.2    Comment: (NOTE) INR goal varies based on device and disease states. Performed at [facility], [place] [address], [place], [place] [address]    XR Chest Single View  Result Date: 05/14/2019 CLINICAL DATA:  85 year old male with tachycardia. EXAM: PORTABLE CHEST 1 VIEW COMPARISON:  Chest radiograph dated 05/20/2018. FINDINGS: Minimal left lung base atelectasis. No focal consolidation, pleural effusion, or pneumothorax. Stable mild cardiomegaly. Atherosclerotic calcification of the aorta. No acute osseous pathology. IMPRESSION: 1. No active disease. 2. Stable mild cardiomegaly. Electronically Signed   By: Anner Crete M.D.   On: 05/14/2019 18:52   CT Angio Chest/Abd/Pel for Dissection W and/or W/WO  Result Date: 05/14/2019 CLINICAL DATA:  Confusion lower back pain and abdominal pain EXAM: CT ANGIOGRAPHY CHEST, ABDOMEN AND PELVIS TECHNIQUE: Multidetector CT imaging through the chest, abdomen and pelvis was performed using the standard protocol during bolus administration of intravenous contrast. Multiplanar reconstructed images and MIPs were obtained and reviewed to evaluate the vascular anatomy. CONTRAST:  160mL OMNIPAQUE IOHEXOL 350 MG/ML SOLN COMPARISON:  None. FINDINGS: CTA CHEST FINDINGS Cardiovascular: --Heart: The heart size is mildly enlarged. There is  smallpericardial effusion. --Aorta: The course and caliber of the thoracic aorta are normal. There is scattered moderate aortic atherosclerotic calcification. Precontrast images show no aortic intramural hematoma. There is no blood pool, dissection or penetrating ulcer demonstrated on arterial phase postcontrast imaging. There is a conventional 3 vessel aortic arch branching pattern. The proximal  arch vessels are widely patent. Coronary artery calcifications are seen. --Pulmonary Arteries: Contrast timing is optimized for preferential opacification of the aorta. Within that limitation, normal central pulmonary arteries. Mediastinum/Nodes: No mediastinal, hilar or axillary lymphadenopathy. The visualized thyroid and thoracic esophageal course are unremarkable. Lungs/Pleura: Mildly increased interstitial markings and tree-in-bud opacities seen at both lung bases, left greater than right. There is also a small amount of streaky opacity at both lung bases. No large airspace consolidation or pleural effusion. Musculoskeletal: No chest wall abnormality. No acute osseous findings. There is diffuse osteopenia. Degenerative changes seen in the thoracic spine. Review of the MIP images confirms the above findings. CTA ABDOMEN AND PELVIS FINDINGS VASCULAR Aorta: Normal caliber aorta without aneurysm, dissection, vasculitis or hemodynamically significant stenosis. There is scattered moderate aortic atherosclerosis. Celiac: No aneurysm, dissection. There is mild stenosis seen at the origin of the celiac axis due to atherosclerosis. Normal branching pattern SMA: There is mild stenosis seen at the origin of the SMA due to atherosclerosis. Renals: Single renal arteries bilaterally. There is moderate stenosis seen at the proximal left renal artery and origin of the right renal artery for due to atherosclerosis. IMA: There is focal stenosis seen at the IMA due to atherosclerosis, however it is patent. Inflow: No aneurysm, stenosis or  dissection. Veins: Normal course and caliber of the major veins. Assessment is otherwise limited by the arterial dominant contrast phase. Review of the MIP images confirms the above findings. NON-VASCULAR Hepatobiliary: Normal hepatic contours and density. No visible biliary dilatation. Normal gallbladder. Pancreas: Normal contours without ductal dilatation. No peripancreatic fluid collection. Spleen: Normal arterial phase splenic enhancement pattern. Adrenals/Urinary Tract: --Adrenal glands: Normal. --Right kidney/ureter: No hydronephrosis or perinephric stranding. No nephrolithiasis. No obstructing ureteral stones. --Left kidney/ureter: There is a large multilocular septated lesion seen within the upper pole of the left kidney measuring 6.9 cm. There is calcifications seen within the internal septae a. No hydronephrosis or shadowing stones. Of --Urinary bladder: Unremarkable. Stomach/Bowel: --Stomach/Duodenum: No hiatal hernia or other gastric abnormality. Normal duodenal course and caliber. --Small bowel: No dilatation or inflammation. --Colon: Scattered colonic diverticula are noted without diverticulitis. --Appendix: Normal. Lymphatic:  No abdominal or pelvic lymphadenopathy. Reproductive: No free fluid in the pelvis. Musculoskeletal. There is diffuse osteopenia. There is age indeterminate superior compression deformity of the T12 vertebral body with approximately 30% loss in superior vertebral body height. There is also a slight superior compression deformity of the L2 vertebral body with 25% loss of vertebral body height. No retropulsion of fragments is seen. There is a mild levoconvex scoliotic curvature of the lumbar spine. Other: None. Review of the MIP images confirms the above findings. IMPRESSION: 1. No acute intrathoracic or abdominal aortic abnormality. 2.  Aortic Atherosclerosis (ICD10-I70.0). 3. mild-to-moderate stenosis due to atherosclerosis at the origin of the SMA, celiac, IMA and bilateral  renal arteries. 4. Mild cardiomegaly and small pericardial effusion 5. Mildly increased interstitial lung markings and tree-in-bud opacities at both lung bases. This is non-specific could be due to chronic lung changes and/or viral infectious etiology. 6. Age-indeterminate compression deformities of T12 and L2 with 25-30% loss in vertebral body height. No retropulsion of fragments or severe canal stenosis. 7. Diverticulosis without diverticulitis. 8. Multilocular cystic lesion measuring 6.9 cm, likely minimally complicated renal cyst. No further evaluation is recommended. This recommendation follows ACR consensus guidelines: Management of the Incidental Renal Mass on CT: A White Paper of the ACR Incidental Findings Committee. J Am Coll Radiol [phone]. Electronically Signed   By: Kerby Moors  Avutu M.D.   On: 05/14/2019 20:49    Pending Labs Unresulted Labs (From admission, onward)    Start     Ordered   05/15/19 0500  CBC  Tomorrow morning,   R     05/14/19 2238   05/15/19 [id number]  Basic metabolic panel  Tomorrow morning,   R     05/14/19 2238   05/15/19 0500  Protime-INR  Daily,   R     05/14/19 2250          Vitals/Pain Today's Vitals   05/14/19 2300 05/14/19 2330 05/15/19 0000 05/15/19 0011  BP: 136/86 123/73 (!) 141/82   Pulse: 98 (!) 55 74   Resp: 16 14 17    SpO2: 99% 97% 96%   Weight:      Height:      PainSc:    Asleep    Isolation Precautions No active isolations  Medications Medications  ibuprofen (ADVIL) tablet 400 mg (has no administration in time range)  morphine 2 MG/ML injection 0.5 mg (has no administration in time range)  Warfarin - Pharmacist Dosing Inpatient (has no administration in time range)  HYDROmorphone (DILAUDID) injection 1 mg (1 mg Intravenous Given 05/14/19 1849)  diltiazem (CARDIZEM) injection 10 mg (10 mg Intravenous Given 05/14/19 1931)  HYDROmorphone (DILAUDID) injection 1 mg (1 mg Intravenous Given 05/14/19 1956)  iohexol (OMNIPAQUE) 350 MG/ML  injection 100 mL (100 mLs Intravenous Contrast Given 05/14/19 2034)  sodium chloride 0.9 % bolus 500 mL (500 mLs Intravenous New Bag/Given 05/14/19 2253)  warfarin (COUMADIN) tablet 2.5 mg (2.5 mg Oral Given 05/14/19 2258)    Mobility manual wheelchair High fall risk   Focused Assessments Musculo-skeletal   R Recommendations: See Admitting Provider Note  Report given to:   Additional Notes:

## 2019-05-15 NOTE — Progress Notes (Signed)
Paged MD again for COVID test, awaiting orders.

## 2019-05-15 NOTE — Progress Notes (Addendum)
[place] for Warfarin  Indication: atrial fibrillation  No Known Allergies  Patient Measurements: Height: 5\' 10"  (177.8 cm) Weight: 195 lb 1.7 oz (88.5 kg) IBW/kg (Calculated) : 73  Vital Signs: Temp: 98.2 F (36.8 C) (12/22 0900) Temp Source: Oral (12/22 0900) BP: 151/86 (12/22 0900) Pulse Rate: 74 (12/22 0211)  Labs: Recent Labs    05/14/19 1822 05/14/19 1900 05/15/19 0308  HGB 12.2*  --  11.0*  HCT 34.7*  --  32.4*  PLT 186  --  157  LABPROT  --  21.7* 22.7*  INR  --  1.9* 2.0*  CREATININE 0.80  --  0.67    Estimated Creatinine Clearance: 68.8 mL/min (by C-G formula based on SCr of 0.67 mg/dL).   Medical History: Past Medical History:  Diagnosis Date  . A-fib ([place])   . Hypertension     Assessment: 85 y/o M in the ED with hyponatremia. On warfarin PTA for afib.   PTA regimen is 2.5 mg daily (last dose per patient 12/20).   INR is just below goal at 1.9 upon admission. Warfarin today came back at 2. Hgb 11, plt 157. No s/sx of bleeding.  Goal of Therapy:  INR 2-3 Monitor platelets by anticoagulation protocol: Yes   Plan:  Warfarin 2.5 mg PO x 1 now  Daily PT/INR Monitor for bleeding  Antonietta Jewel, PharmD, BCCCP Clinical Pharmacist  Phone: [phone]  Please check AMION for all [place] phone numbers After 10:00 PM, call [place] [phone]

## 2019-05-15 NOTE — Progress Notes (Signed)
Received patient to room 4e04 from ED, patient altert and oriented. Tele monitor applied and CCMD notified. CHG bath given. Oriented to room and call bell within reach. COVID test not done prior to arrival on the floor. Notified MD, awaiting orders.   Tawanna Sat, RN

## 2019-05-15 NOTE — Evaluation (Addendum)
Physical Therapy Evaluation Patient Details Name: Adam Richards MRN: [id number] DOB: [birth year] Today's Date: 05/15/2019   History of Present Illness  Pt is a 85 y.o. male admitted from home on 05/14/19 with c/o severe back pain after doing "some back exercises," also with c/o of SOB and palpitations. CT angio negative for dissection. Imaging with age indeterminate compression deformities at T12 and L2. PMH includes vertebral compression fx, afib on coumadin, HTN.    Clinical Impression  Pt presents with an overall decrease in functional mobility secondary to above. PTA, pt independent with ambulation and ADLs, lives alone at [place]. Today, pt moving well with min guard; stability improved with RW as pt limited by anterolateral L thigh pain; also with c/o slight sensation changes in L lateral thigh/calf and toes. Spoke with daughter on phone who reports pt can discharge to her home a few days for initial 24/7 supervision due to fall risk. Pt would benefit from continued acute PT services to maximize functional mobility and independence prior to d/c with HHPT services.   SpO2 98% on RA Post-mobility BP 134/76, HR 70s    Follow Up Recommendations Home health PT;Supervision/Assistance - 24 hour    Equipment Recommendations  None recommended by PT    Recommendations for Other Services       Precautions / Restrictions Precautions Precautions: Fall Restrictions Weight Bearing Restrictions: No      Mobility  Bed Mobility Overal bed mobility: Modified Independent                Transfers Overall transfer level: Needs assistance Equipment used: None;Rolling walker (2 wheeled) Transfers: Sit to/from Stand Sit to Stand: Min guard;Supervision         General transfer comment: Initial min guard standing from EOB without DME. Performed multiple sit<>stands from [facility] (over toilet) and recliner to RW at supervision-level  Ambulation/Gait Ambulation/Gait assistance: Min  guard Gait Distance (Feet): 120 Feet Assistive device: None;Rolling walker (2 wheeled) Gait Pattern/deviations: Step-through pattern;Decreased stride length;Decreased weight shift to left;Antalgic   Gait velocity interpretation: 1.31 - 2.62 ft/sec, indicative of limited community ambulator General Gait Details: Initial hallway ambulation without DME, pt with antalgic gait c/o LLE pain, min guard for balance; additional gait training with RW, stability improved. Cues to maintain proximity to RW and upright posture  Stairs            Wheelchair Mobility    Modified Rankin (Stroke Patients Only)       Balance Overall balance assessment: Needs assistance   Sitting balance-Leahy Scale: Good Sitting balance - Comments: Indep with pericare while seated on BSC over toilet     Standing balance-Leahy Scale: Fair Standing balance comment: Dynamic stability improved with UE support                             Pertinent Vitals/Pain Pain Assessment: Faces Faces Pain Scale: Hurts a little bit Pain Location: Lower back, L anterolateral thigh Pain Descriptors / Indicators: Discomfort;Numbness Pain Intervention(s): Monitored during session    Home Living Family/patient expects to be discharged to:: Assisted living               Home Equipment: Walker - 2 wheels;Walker - 4 wheels;Grab bars - tub/shower Additional Comments: Pt from [facility]. 2nd floor apartment with elevator access. Reports staff not available to assist with mobility/ADL tasks. Can d/c to daughter's home which is all one level, 1 step to enter  Prior Function Level of Independence: Independent         Comments: Independent without DME, before COVID restrictions was walking to dining hall for meals. Retired Nature conservation officer        Extremity/Trunk Assessment   Upper Extremity Assessment Upper Extremity Assessment: Overall WFL for tasks assessed     Lower Extremity Assessment Lower Extremity Assessment: LLE deficits/detail LLE Deficits / Details: 4-5/5 strength throughout; pt reports decreased light touch/numbness sensation on anterolateral thigh, lateral calf and toes LLE Sensation: decreased light touch    Cervical / Trunk Assessment Cervical / Trunk Assessment: Kyphotic  Communication   Communication: HOH  Cognition Arousal/Alertness: Awake/alert Behavior During Therapy: WFL for tasks assessed/performed Overall Cognitive Status: Within Functional Limits for tasks assessed                                 General Comments: WFL for simple tasks; some memory and processing deficits likely baseline cognition      General Comments General comments (skin integrity, edema, etc.): Spoke with daughter on phone in patient's room, agreeable to have pt stay with her a few days for initial 24/7 support before returning to ILF    Exercises     Assessment/Plan    PT Assessment Patient needs continued PT services  PT Problem List Decreased activity tolerance;Decreased balance;Decreased mobility;Decreased knowledge of use of DME;Pain       PT Treatment Interventions DME instruction;Gait training;Stair training;Functional mobility training;Therapeutic activities;Therapeutic exercise;Balance training;Patient/family education    PT Goals (Current goals can be found in the Care Plan section)  Acute Rehab PT Goals Patient Stated Goal: Decreased leg pain; agreeable to d/c to daughter's home for initial 24/7 support PT Goal Formulation: With patient/family Time For Goal Achievement: 05/29/19 Potential to Achieve Goals: Good    Frequency Min 3X/week   Barriers to discharge        Co-evaluation               AM-PAC PT "6 Clicks" Mobility  Outcome Measure Help needed turning from your back to your side while in a flat bed without using bedrails?: None Help needed moving from lying on your back to sitting on the  side of a flat bed without using bedrails?: None Help needed moving to and from a bed to a chair (including a wheelchair)?: A Little Help needed standing up from a chair using your arms (e.g., wheelchair or bedside chair)?: A Little Help needed to walk in hospital room?: A Little Help needed climbing 3-5 steps with a railing? : A Little 6 Click Score: 20    End of Session Equipment Utilized During Treatment: Gait belt Activity Tolerance: Patient tolerated treatment well Patient left: in chair;with call bell/phone within reach;with chair alarm set Nurse Communication: Mobility status PT Visit Diagnosis: Other abnormalities of gait and mobility (R26.89);Pain    Time: 1446-1535 PT Time Calculation (min) (ACUTE ONLY): 49 min   Charges:   PT Evaluation $PT Eval Moderate Complexity: 1 Mod PT Treatments $Gait Training: 8-22 mins $Therapeutic Activity: 8-22 mins   Mabeline Caras, PT, DPT Acute Rehabilitation Services  Pager [phone] Office [place] 05/15/2019, 5:10 PM

## 2019-05-15 NOTE — Progress Notes (Signed)
PROGRESS NOTE  Adam Richards [id number] DOB: [birth year] DOA: 05/14/2019 PCP: Wenda Low, MD  Brief History   The patient is a 85 yr old man who presented from home with complaints of severe back pain. The patient carries a past medical history significant for vertebral compression fracture, atrial fibrillation on coumadin, and hypertension. He states that the pain began a couple of days ago after he did some "back exercises which included arching his back. There is no saddle anesthesia or lower extremity weakness. He has no incontinence of bowel or bladder. He also had some complaints of palpitations, although no chest pain. He has had some shortness of breath with exertion lately and some increased lower extremity edema. CT angio of the chest and abdomen was performed to rule out dissection which was negative. 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 There was mild cardiomegaly with small pleural effusions. Tree in bud opacities in both lung bases were non-specific but could represent chronic lung changes. There were age-indeterminate compression deformities of T12 and L2 with 25 - 30% loss in vertebral body heiight. There is no retropulsion of fragments or severe canal stenosis.   He was admitted to a telemetry bed. He has been given pain control.   Consultants  . None  Procedures  . None  Antibiotics   Anti-infectives (From admission, onward)   None    .  Subjective  The patient is resting in bed. He states that his back is giving him severe pain again. No new complaints.  Objective   Vitals:  Vitals:   05/15/19 0211 05/15/19 0900  BP: 140/86 (!) 151/86  Pulse: 74   Resp: 17   Temp: 97.6 F (36.4 C) 98.2 F (36.8 C)  SpO2: 97%    Exam:  Constitutional:  . The patient is awake, alert, and oriented x 3. No acute distress. Respiratory:  . No increased work of breathing. . No  wheezes, rales, or rhonchi . No tactile fremitus Cardiovascular:  . Regular rate and rhythm . No murmurs, ectopy, or gallups. . No lateral PMI. No thrills. Abdomen:  . Abdomen is soft, non-tender, non-distended . No hernias, masses, or organomegaly . Normoactive bowel sounds.  Musculoskeletal:  . No cyanosis, clubbing, or edema Skin:  . No rashes, lesions, ulcers . palpation of skin: no induration or nodules Neurologic:  . CN 2-12 intact . Sensation all 4 extremities intact Psychiatric:  . Mental status o Mood, affect appropriate o Orientation to person, place, time  . judgment and insight appear intact  I have personally reviewed the following:   Today's Data  . Vitals, BMP, CBC  Imaging  . CTA chest abdomen and pelvis  Cardiology Data  . EKG  Scheduled Meds: . docusate sodium  200 mg Oral Daily  . dutasteride  0.5 mg Oral Daily  . methocarbamol  500 mg Oral QID  . metoprolol tartrate  25 mg Oral BID  . tamsulosin  0.4 mg Oral Daily  . Warfarin - Pharmacist Dosing Inpatient   Does not apply q1800   Continuous Infusions:  Active Problems:   Atrial fibrillation, chronic (HCC)   Back pain   Subtherapeutic international normalized ratio (INR)   Hyponatremia   LOS: 0 days   A & P   Lumbar back pain: Pt has a known history of compression fracture. Age-indeterminate T12 and L2 compression deformity is seen on CTA chest a abdomen. No retropulsion of  fragments was seen. The patient is receiving pain control and muscle relaxants as well as a K-pad. PT/OT has been consulted. It is suspected that the patient's pain is musculoskeletal as onset was preceded by increased level of excercising his back.   Atrial fibrillation with RVR: Pt remains in atrial fibrillation with controlled rate on metoprolol. He is continued on coumadin for stroke prophylaxis as managed by pharmacy. He will also be continued on digoxin. Monitor.  Subtherapeutic INR: Upon admission INR was 1.9.  This am 2.0. Warfarin is managed by pharmacy.  Hyponatremia: Sodium remains 127. Likely related to the patient's pain. Will check urine sodium and urine osmolality. Fluid restriction.  I have seen and examined this patient myself. I have spent 32 minutes in his evaluation and care.  Archit Leger, DO [facility] Direct contact: see [email or web address]  7PM-7AM contact night coverage as above 05/15/2019, 12:11 PM  LOS: 0 days

## 2019-05-16 DIAGNOSIS — D649 Anemia, unspecified: Secondary | ICD-10-CM

## 2019-05-16 LAB — CBC WITH DIFFERENTIAL/PLATELET
Abs Immature Granulocytes: 0.01 10*3/uL (ref 0.00–0.07)
Basophils Absolute: 0 10*3/uL (ref 0.0–0.1)
Basophils Relative: 0 %
Eosinophils Absolute: 0.1 10*3/uL (ref 0.0–0.5)
Eosinophils Relative: 2 %
HCT: 31.4 % — ABNORMAL LOW (ref 39.0–52.0)
Hemoglobin: 10.9 g/dL — ABNORMAL LOW (ref 13.0–17.0)
Immature Granulocytes: 0 %
Lymphocytes Relative: 34 %
Lymphs Abs: 1.8 10*3/uL (ref 0.7–4.0)
MCH: 33.7 pg (ref 26.0–34.0)
MCHC: 34.7 g/dL (ref 30.0–36.0)
MCV: 97.2 fL (ref 80.0–100.0)
Monocytes Absolute: 0.5 10*3/uL (ref 0.1–1.0)
Monocytes Relative: 9 %
Neutro Abs: 3 10*3/uL (ref 1.7–7.7)
Neutrophils Relative %: 55 %
Platelets: 150 10*3/uL (ref 150–400)
RBC: 3.23 MIL/uL — ABNORMAL LOW (ref 4.22–5.81)
RDW: 14.2 % (ref 11.5–15.5)
WBC: 5.4 10*3/uL (ref 4.0–10.5)
nRBC: 0 % (ref 0.0–0.2)

## 2019-05-16 LAB — BASIC METABOLIC PANEL
Anion gap: 8 (ref 5–15)
BUN: 17 mg/dL (ref 8–23)
CO2: 25 mmol/L (ref 22–32)
Calcium: 8.8 mg/dL — ABNORMAL LOW (ref 8.9–10.3)
Chloride: 93 mmol/L — ABNORMAL LOW (ref 98–111)
Creatinine, Ser: 0.76 mg/dL (ref 0.61–1.24)
GFR calc Af Amer: >60 mL/min (ref >60)
GFR calc non Af Amer: >60 mL/min (ref >60)
Glucose, Bld: 88 mg/dL (ref 70–99)
Potassium: 4.2 mmol/L (ref 3.5–5.1)
Sodium: 126 mmol/L — ABNORMAL LOW (ref 135–145)

## 2019-05-16 LAB — PROTIME-INR
INR: 2.9 — ABNORMAL HIGH (ref 0.8–1.2)
Prothrombin Time: 30.1 seconds — ABNORMAL HIGH (ref 11.4–15.2)

## 2019-05-16 MED ORDER — WARFARIN 0.5 MG HALF TABLET
0.5000 mg | ORAL_TABLET | Freq: Once | ORAL | Status: AC
  Administered 2019-05-16: 0.5 mg via ORAL
  Filled 2019-05-16: qty 1

## 2019-05-16 MED ORDER — SODIUM CHLORIDE 0.9 % IV SOLN
75.0000 mL/h | INTRAVENOUS | Status: AC
  Administered 2019-05-16: 75 mL/h via INTRAVENOUS

## 2019-05-16 NOTE — Progress Notes (Signed)
ANTICOAGULATION CONSULT NOTE - Follow Up Consult  Pharmacy Consult for Warfarin Indication: atrial fibrillation  No Known Allergies  Patient Measurements: Height: 5\' 10"  (177.8 cm) Weight: 195 lb 1.7 oz (88.5 kg) IBW/kg (Calculated) : 73   Vital Signs: Temp: 97.6 F (36.4 C) (12/23 0438) Temp Source: Oral (12/23 0438) BP: 142/92 (12/23 0828) Pulse Rate: 71 (12/23 0828)  Labs: Recent Labs    05/14/19 1822 05/14/19 1900 05/15/19 0308 05/15/19 1757 05/16/19 0321  HGB 12.2*  --  11.0*  --  10.9*  HCT 34.7*  --  32.4*  --  31.4*  PLT 186  --  157  --  150  LABPROT  --  21.7* 22.7*  --  30.1*  INR  --  1.9* 2.0*  --  2.9*  CREATININE 0.80  --  0.67 0.92 0.76    Estimated Creatinine Clearance: 68.8 mL/min (by C-G formula based on SCr of 0.76 mg/dL).  Assessment:  85 y/o M in the ED with hyponatremia. On warfarin PTA for afib.    PTA regimen is 2.5 mg daily (last dose per patient 12/20).    INR up 2.0 > 2.9 after usual 2.5 mg dose x 2 days.  May still increase more by tomorrow.  CBC trending down some.  No bleeding reported.  Goal of Therapy:  INR 2-3 Monitor platelets by anticoagulation protocol: Yes   Plan:  Decrease today's warfarin dose to 0.5 mg x.1. Daily PT/INR for now. Monitor for signs/symptoms of bleeding.  Arty Baumgartner, [place] Phone: [phone] 05/16/2019,3:47 PM

## 2019-05-16 NOTE — Discharge Instructions (Signed)

## 2019-05-16 NOTE — Evaluation (Signed)
Occupational Therapy Evaluation Patient Details Name: Adam Richards MRN: [id number] DOB: [birth year] Today's Date: 05/16/2019    History of Present Illness Pt is a 85 y.o. male admitted from home on 05/14/19 with c/o severe back pain after doing "some back exercises," also with c/o of SOB and palpitations. CT angio negative for dissection. Imaging with age indeterminate compression deformities at T12 and L2. PMH includes vertebral compression fx, afib on coumadin, HTN.   Clinical Impression   Pt admitted with the above diagnoses and presents with below problem list. Pt will benefit from continued acute OT to address the below listed deficits and maximize independence with basic ADLs prior to discharge home with daughter providing initial 24/7 assist. At baseline, pt is independent with basic ADLs. Pt is from [facility]. Pt limited this session by increased, cramping pain (8/10 Faces) in LLE and back during OOB activity. Nursing notified. Pt also reporting some SOB alongside the increased pain. Pt currently min guard with LB ADLs, toilet/shower transfers and functional mobility.      Follow Up Recommendations  Home health OT;Supervision/Assistance - 24 hour(initial 24/7 supervision/assist)    Equipment Recommendations  3 in 1 bedside commode    Recommendations for Other Services       Precautions / Restrictions Precautions Precautions: Fall Restrictions Weight Bearing Restrictions: No      Mobility Bed Mobility Overal bed mobility: Modified Independent             General bed mobility comments: extra time and effort. no physical assist  Transfers Overall transfer level: Needs assistance Equipment used: Rolling walker (2 wheeled) Transfers: Sit to/from Stand Sit to Stand: Min guard;Supervision         General transfer comment: to/from EOB and 3n1 over toilet. Min guard for safety and light steadying. Difficulty during pivotal turns advancing LLE 2/2 pain. Cues to back up until  legs touch seat prior to sitting.     Balance Overall balance assessment: Needs assistance   Sitting balance-Leahy Scale: Fair Sitting balance - Comments: good at start of session, fair at end of session 2/2 increased pain   Standing balance support: Bilateral upper extremity supported Standing balance-Leahy Scale: Poor Standing balance comment: rw needed throughout all OOB activity.                            ADL either performed or assessed with clinical judgement   ADL Overall ADL's : Needs assistance/impaired Eating/Feeding: Set up;Sitting   Grooming: Set up;Min guard;Sitting;Standing   Upper Body Bathing: Set up;Sitting   Lower Body Bathing: Min guard;Sit to/from stand;Sitting/lateral leans   Upper Body Dressing : Set up;Sitting   Lower Body Dressing: Min guard;Sit to/from stand;Sitting/lateral leans   Toilet Transfer: Min guard;Ambulation;RW Toilet Transfer Details (indicate cue type and reason): 3n1 over toilet Toileting- Clothing Manipulation and Hygiene: Min guard;Sit to/from stand   Tub/ Shower Transfer: Min guard;Ambulation;3 in 1;Rolling walker   Functional mobility during ADLs: Min guard;Rolling walker General ADL Comments: Pt completed bed mobility, ambulated to/from bathroom, toilet transfer to 3n1 over toilet and pericare. Min guard for OOB activity and transfers. Increased pain in LLE/back in standing position. Session limitd by pain. Plan was up to recliner at end of session but pt needing to return to supine 2/2 pain.      Vision         Perception     Praxis      Pertinent Vitals/Pain Pain Assessment: Faces Faces Pain  Scale: Hurts whole lot Pain Location: LLE into back Pain Descriptors / Indicators: Cramping;Moaning;Grimacing;Guarding Pain Intervention(s): Limited activity within patient's tolerance;Monitored during session;Repositioned;Patient requesting pain meds-RN notified     Hand Dominance     Extremity/Trunk Assessment  Upper Extremity Assessment Upper Extremity Assessment: Overall WFL for tasks assessed;Generalized weakness   Lower Extremity Assessment Lower Extremity Assessment: Defer to PT evaluation   Cervical / Trunk Assessment Cervical / Trunk Assessment: Kyphotic   Communication Communication Communication: HOH   Cognition Arousal/Alertness: Awake/alert Behavior During Therapy: WFL for tasks assessed/performed Overall Cognitive Status: Within Functional Limits for tasks assessed                                 General Comments: WFL for simple tasks; some memory and processing deficits likely baseline cognition   General Comments  Pt reporting some shortness of breath along with his increased pain with OOB activity. At end of session HR 78, O2 on RA 97    Exercises     Shoulder Instructions      Home Living Family/patient expects to be discharged to:: Assisted living                             Home Equipment: Walker - 2 wheels;Walker - 4 wheels;Grab bars - tub/shower   Additional Comments: Pt from [place]. 2nd floor apartment with elevator access. Reports staff not available to assist with mobility/ADL tasks. Can d/c to daughter's home which is all one level, 1 step to enter      Prior Functioning/Environment Level of Independence: Independent        Comments: Independent without DME, before COVID restrictions was walking to dining hall for meals. [employer]        OT Problem List: Decreased strength;Decreased activity tolerance;Impaired balance (sitting and/or standing);Decreased knowledge of use of DME or AE;Decreased knowledge of precautions;Cardiopulmonary status limiting activity;Pain      OT Treatment/Interventions: Self-care/ADL training;Energy conservation;DME and/or AE instruction;Therapeutic activities;Patient/family education;Balance training    OT Goals(Current goals can be found in the care plan  section) Acute Rehab OT Goals Patient Stated Goal: Decreased leg pain; agreeable to d/c to daughter's home for initial 24/7 support OT Goal Formulation: With patient Time For Goal Achievement: 05/30/19 Potential to Achieve Goals: Good ADL Goals Pt Will Perform Grooming: with modified independence;sitting;standing Pt Will Perform Lower Body Bathing: with modified independence;sit to/from stand Pt Will Perform Lower Body Dressing: with modified independence;sit to/from stand Pt Will Transfer to Toilet: with modified independence;ambulating Pt Will Perform Toileting - Clothing Manipulation and hygiene: with modified independence;sit to/from stand Pt Will Perform Tub/Shower Transfer: with modified independence;with supervision;ambulating;3 in 1;rolling walker  OT Frequency: Min 2X/week   Barriers to D/C:    Pt to stay with daughter intially after d/c       Co-evaluation              AM-PAC OT "6 Clicks" Daily Activity     Outcome Measure Help from another person eating meals?: None Help from another person taking care of personal grooming?: None Help from another person toileting, which includes using toliet, bedpan, or urinal?: A Little Help from another person bathing (including washing, rinsing, drying)?: A Little Help from another person to put on and taking off regular upper body clothing?: None Help from another person to put on and taking off regular  lower body clothing?: A Little 6 Click Score: 21   End of Session Equipment Utilized During Treatment: Rolling walker Nurse Communication: Patient requests pain meds  Activity Tolerance: Patient limited by pain;Other (comment)(pain increasing during OOB activity) Patient left: in bed;with call bell/phone within reach  OT Visit Diagnosis: Unsteadiness on feet (R26.81);Pain;Muscle weakness (generalized) (M62.81)                Time: [id number] OT Time Calculation (min): 25 min Charges:  OT General Charges $OT Visit: 1  Visit OT Evaluation $OT Eval Low Complexity: 1 Low OT Treatments $Self Care/Home Management : 8-22 mins  Tyrone Schimke, OT Acute Rehabilitation Services Pager: [phone] Office: [phone]   Hortencia Pilar 05/16/2019, 10:17 AM

## 2019-05-16 NOTE — Progress Notes (Addendum)
Marland Kitchen  PROGRESS NOTE    Adam Richards  [id number] DOB: 19-Sep-1926 DOA: 05/14/2019 PCP: Wenda Low, MD   Brief Narrative:   The patient is a 85 yr old man who presented from home with complaints of severe back pain. The patient carries a past medical history significant for vertebral compression fracture, atrial fibrillation on coumadin, and hypertension. He states that the pain began a couple of days ago after he did some "back exercises which included arching his back. There is no saddle anesthesia or lower extremity weakness. He has no incontinence of bowel or bladder. He also had some complaints of palpitations, although no chest pain. He has had some shortness of breath with exertion lately and some increased lower extremity edema. CT angio of the chest and abdomen was performed to rule out dissection which was negative. 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 There was mild cardiomegaly with small pleural effusions. Tree in bud opacities in both lung bases were non-specific but could represent chronic lung changes. There were age-indeterminate compression deformities of T12 and L2 with 25 - 30% loss in vertebral body heiight. There is no retropulsion of fragments or severe canal stenosis.   05/16/19: Na+ is stable from yesterday. Urine studies suggest some hypovolemia. Will add !L NS and recheck BMP   Assessment & Plan:   Active Problems:   Atrial fibrillation, chronic (HCC)   Back pain   Subtherapeutic international normalized ratio (INR)   Hyponatremia   Lumbar pain  Lumbar back pain     - Pt has a known history of compression fracture.      - Age-indeterminate T12 and L2 compression deformity is seen on CTA chest and abdomen.     - Pt is receiving pain control and muscle relaxants as well as a K-pad.     - PT/OT onboard: rec HHPT/OT   Atrial fibrillation with RVR     - atrial fibrillation  with controlled rate on metoprolol; digoxin was held     - Coumadin for stroke prophylaxis as managed by pharmacy.  Subtherapeutic INR     - at admission INR was 1.9.     - pharm dosing; INR 2.9 today, monitor  Hyponatremia     - Na+ 127 at admission, it's 126 today     - urine studies suggest some hypovolemia; will add 1L NS, and repeat studies  Normocytic anemia     - check iron studies     - no evienc of bleed  DVT prophylaxis: coumadin Code Status: FULL   Disposition Plan: TBD  ROS:  Denies CP, N, V, ab pain, dyspnea . Remainder 10-pt ROS is negative for all not previously mentioned.  Subjective: "I think it might be trying to start up again."  Objective: Vitals:   05/15/19 0900 05/15/19 2019 05/16/19 0438 05/16/19 0828  BP: (!) 151/86 (!) 128/96 (!) 139/100 (!) 142/92  Pulse:  80 (!) 52 71  Resp:  18 16   Temp: 98.2 F (36.8 C) 98.6 F (37 C) 97.6 F (36.4 C)   TempSrc: Oral Oral Oral   SpO2:  94% 95%   Weight:      Height:        Intake/Output Summary (Last 24 hours) at 05/16/2019 1048 Last data filed at 05/15/2019 1940 Gross per 24 hour  Intake 200 ml  Output 125 ml  Net 75 ml   [employer]  05/14/19 2229 05/15/19 0211  Weight: 87.4 kg 88.5 kg    Examination:  General: 85 y.o. male resting in bed in NAD Cardiovascular: irregular, +S1, S2, no m/g/r Respiratory: CTABL, no w/r/r, normal WOB GI: BS+, NDNT, no masses noted, no organomegaly noted MSK: No e/c/c Neuro: alert to name, follows commands Psyc: Appropriate interaction and affect, calm/cooperative   Data Reviewed: I have personally reviewed following labs and imaging studies.  CBC: Recent Labs  Lab 05/14/19 1822 05/15/19 0308 05/16/19 0321  WBC 8.8 7.1 5.4  NEUTROABS  --   --  3.0  HGB 12.2* 11.0* 10.9*  HCT 34.7* 32.4* 31.4*  MCV 95.1 98.2 97.2  PLT 186 157 [id number]   Basic Metabolic Panel: Recent Labs  Lab 05/14/19 1822 05/15/19 0308 05/15/19 1757 05/16/19 0321  NA 127*  127* 123* 126*  K 4.1 3.8 4.7 4.2  CL 93* 93* 90* 93*  CO2 24 24 23 25   GLUCOSE 98 101* 111* 88  BUN 13 12 16 17   CREATININE 0.80 0.67 0.92 0.76  CALCIUM 9.3 8.9 8.9 8.8*   GFR: Estimated Creatinine Clearance: 68.8 mL/min (by C-G formula based on SCr of 0.76 mg/dL). Liver Function Tests: Recent Labs  Lab 05/14/19 1822  AST 28  ALT 20  ALKPHOS 93  BILITOT 1.0  PROT 6.7  ALBUMIN 4.1   Recent Labs  Lab 05/14/19 1822  LIPASE 24   No results for input(s): AMMONIA in the last 168 hours. Coagulation Profile: Recent Labs  Lab 05/14/19 1900 05/15/19 0308 05/16/19 0321  INR 1.9* 2.0* 2.9*   Cardiac Enzymes: No results for input(s): CKTOTAL, CKMB, CKMBINDEX, TROPONINI in the last 168 hours. BNP (last 3 results) No results for input(s): PROBNP in the last 8760 hours. HbA1C: No results for input(s): HGBA1C in the last 72 hours. CBG: No results for input(s): GLUCAP in the last 168 hours. Lipid Profile: No results for input(s): CHOL, HDL, LDLCALC, TRIG, CHOLHDL, LDLDIRECT in the last 72 hours. Thyroid Function Tests: No results for input(s): TSH, T4TOTAL, FREET4, T3FREE, THYROIDAB in the last 72 hours. Anemia Panel: No results for input(s): VITAMINB12, FOLATE, FERRITIN, TIBC, IRON, RETICCTPCT in the last 72 hours. Sepsis Labs: Recent Labs  Lab 05/14/19 1822  LATICACIDVEN 1.7    Recent Results (from the past 240 hour(s))  SARS CORONAVIRUS 2 (TAT 6-24 HRS) Nasopharyngeal Nasopharyngeal Swab     Status: None   Collection Time: 05/15/19  4:10 AM   Specimen: Nasopharyngeal Swab  Result Value Ref Range Status   SARS Coronavirus 2 NEGATIVE NEGATIVE Final    Comment: (NOTE) SARS-CoV-2 target nucleic acids are NOT DETECTED. The SARS-CoV-2 RNA is generally detectable in upper and lower respiratory specimens during the acute phase of infection. Negative results do not preclude SARS-CoV-2 infection, do not rule out co-infections with other pathogens, and should not be used as  the sole basis for treatment or other patient management decisions. Negative results must be combined with clinical observations, patient history, and epidemiological information. The expected result is Negative. Fact Sheet for Patients: SugarRoll.be Fact Sheet for Healthcare Providers: https://www.woods-mathews.com/ This test is not yet approved or cleared by the [place] FDA and  has been authorized for detection and/or diagnosis of SARS-CoV-2 by FDA under an Emergency Use Authorization (EUA). This EUA will remain  in effect (meaning this test can be used) for the duration of the COVID-19 declaration under Section 56 4(b)(1) of the Act, 21 U.S.C. section 360bbb-3(b)(1), unless the authorization is terminated or revoked sooner. Performed  at [facility], [place] [address] [place], [place] [address]       Radiology Studies: XR Chest Single View  Result Date: 05/14/2019 CLINICAL DATA:  85 year old male with tachycardia. EXAM: PORTABLE CHEST 1 VIEW COMPARISON:  Chest radiograph dated 05/20/2018. FINDINGS: Minimal left lung base atelectasis. No focal consolidation, pleural effusion, or pneumothorax. Stable mild cardiomegaly. Atherosclerotic calcification of the aorta. No acute osseous pathology. IMPRESSION: 1. No active disease. 2. Stable mild cardiomegaly. Electronically Signed   By: Anner Crete M.D.   On: 05/14/2019 18:52   CT Angio Chest/Abd/Pel for Dissection W and/or W/WO  Result Date: 05/14/2019 CLINICAL DATA:  Confusion lower back pain and abdominal pain EXAM: CT ANGIOGRAPHY CHEST, ABDOMEN AND PELVIS TECHNIQUE: Multidetector CT imaging through the chest, abdomen and pelvis was performed using the standard protocol during bolus administration of intravenous contrast. Multiplanar reconstructed images and MIPs were obtained and reviewed to evaluate the vascular anatomy. CONTRAST:  128mL OMNIPAQUE IOHEXOL 350 MG/ML SOLN COMPARISON:   None. FINDINGS: CTA CHEST FINDINGS Cardiovascular: --Heart: The heart size is mildly enlarged. There is smallpericardial effusion. --Aorta: The course and caliber of the thoracic aorta are normal. There is scattered moderate aortic atherosclerotic calcification. Precontrast images show no aortic intramural hematoma. There is no blood pool, dissection or penetrating ulcer demonstrated on arterial phase postcontrast imaging. There is a conventional 3 vessel aortic arch branching pattern. The proximal arch vessels are widely patent. Coronary artery calcifications are seen. --Pulmonary Arteries: Contrast timing is optimized for preferential opacification of the aorta. Within that limitation, normal central pulmonary arteries. Mediastinum/Nodes: No mediastinal, hilar or axillary lymphadenopathy. The visualized thyroid and thoracic esophageal course are unremarkable. Lungs/Pleura: Mildly increased interstitial markings and tree-in-bud opacities seen at both lung bases, left greater than right. There is also a small amount of streaky opacity at both lung bases. No large airspace consolidation or pleural effusion. Musculoskeletal: No chest wall abnormality. No acute osseous findings. There is diffuse osteopenia. Degenerative changes seen in the thoracic spine. Review of the MIP images confirms the above findings. CTA ABDOMEN AND PELVIS FINDINGS VASCULAR Aorta: Normal caliber aorta without aneurysm, dissection, vasculitis or hemodynamically significant stenosis. There is scattered moderate aortic atherosclerosis. Celiac: No aneurysm, dissection. There is mild stenosis seen at the origin of the celiac axis due to atherosclerosis. Normal branching pattern SMA: There is mild stenosis seen at the origin of the SMA due to atherosclerosis. Renals: Single renal arteries bilaterally. There is moderate stenosis seen at the proximal left renal artery and origin of the right renal artery for due to atherosclerosis. IMA: There is focal  stenosis seen at the IMA due to atherosclerosis, however it is patent. Inflow: No aneurysm, stenosis or dissection. Veins: Normal course and caliber of the major veins. Assessment is otherwise limited by the arterial dominant contrast phase. Review of the MIP images confirms the above findings. NON-VASCULAR Hepatobiliary: Normal hepatic contours and density. No visible biliary dilatation. Normal gallbladder. Pancreas: Normal contours without ductal dilatation. No peripancreatic fluid collection. Spleen: Normal arterial phase splenic enhancement pattern. Adrenals/Urinary Tract: --Adrenal glands: Normal. --Right kidney/ureter: No hydronephrosis or perinephric stranding. No nephrolithiasis. No obstructing ureteral stones. --Left kidney/ureter: There is a large multilocular septated lesion seen within the upper pole of the left kidney measuring 6.9 cm. There is calcifications seen within the internal septae a. No hydronephrosis or shadowing stones. Of --Urinary bladder: Unremarkable. Stomach/Bowel: --Stomach/Duodenum: No hiatal hernia or other gastric abnormality. Normal duodenal course and caliber. --Small bowel: No dilatation or inflammation. --Colon: Scattered  colonic diverticula are noted without diverticulitis. --Appendix: Normal. Lymphatic:  No abdominal or pelvic lymphadenopathy. Reproductive: No free fluid in the pelvis. Musculoskeletal. There is diffuse osteopenia. There is age indeterminate superior compression deformity of the T12 vertebral body with approximately 30% loss in superior vertebral body height. There is also a slight superior compression deformity of the L2 vertebral body with 25% loss of vertebral body height. No retropulsion of fragments is seen. There is a mild levoconvex scoliotic curvature of the lumbar spine. Other: None. Review of the MIP images confirms the above findings. IMPRESSION: 1. No acute intrathoracic or abdominal aortic abnormality. 2.  Aortic Atherosclerosis (ICD10-I70.0). 3.  mild-to-moderate stenosis due to atherosclerosis at the origin of the SMA, celiac, IMA and bilateral renal arteries. 4. Mild cardiomegaly and small pericardial effusion 5. Mildly increased interstitial lung markings and tree-in-bud opacities at both lung bases. This is non-specific could be due to chronic lung changes and/or viral infectious etiology. 6. Age-indeterminate compression deformities of T12 and L2 with 25-30% loss in vertebral body height. No retropulsion of fragments or severe canal stenosis. 7. Diverticulosis without diverticulitis. 8. Multilocular cystic lesion measuring 6.9 cm, likely minimally complicated renal cyst. No further evaluation is recommended. This recommendation follows ACR consensus guidelines: Management of the Incidental Renal Mass on CT: A White Paper of the ACR Incidental Findings Committee. J Am Coll Radiol [id number]. Electronically Signed   By: Prudencio Pair M.D.   On: 05/14/2019 20:49     Scheduled Meds: . docusate sodium  200 mg Oral Daily  . dutasteride  0.5 mg Oral Daily  . methocarbamol  500 mg Oral QID  . metoprolol tartrate  25 mg Oral BID  . tamsulosin  0.4 mg Oral Daily  . Warfarin - Pharmacist Dosing Inpatient   Does not apply q1800   Continuous Infusions: . sodium chloride 75 mL/hr (05/16/19 0825)     LOS: 1 day    Time spent: 25 minutes spent in the coordination of care today.    Jonnie Finner, DO [facility] Pager [phone]  If 7PM-7AM, please contact night-coverage [email or web address] Password [facility] 05/16/2019, 10:48 AM

## 2019-05-16 NOTE — Progress Notes (Signed)
Physical Therapy Treatment Patient Details Name: Adam Richards MRN: [id number] DOB: 1926-12-16 Today's Date: 05/16/2019    History of Present Illness Pt is a 85 y.o. male admitted from home on 05/14/19 with c/o severe back pain after doing "some back exercises," also with c/o of SOB and palpitations. CT angio negative for dissection. Imaging with age indeterminate compression deformities at T12 and L2. PMH includes vertebral compression fx, afib on coumadin, HTN.    PT Comments    Pt limited by back pain during session, radiating from back into LLE. Pain limiting pt ability to WB through LLE and limit ambulation distances. Pt performing seated flexion in sitting with mild improvement in back and LLE pain, agreeable to performing for HEP. Pt will continue to benefit from acute PT POC to increase activity tolerance and reduce falls risk.  Follow Up Recommendations  Home health PT;Supervision/Assistance - 24 hour     Equipment Recommendations  None recommended by PT(pt owns RW)    Recommendations for Other Services       Precautions / Restrictions Precautions Precautions: Fall Restrictions Weight Bearing Restrictions: No    Mobility  Bed Mobility Overal bed mobility: Modified Independent             General bed mobility comments: extra time  Transfers Overall transfer level: Needs assistance Equipment used: Rolling walker (2 wheeled) Transfers: Sit to/from Stand Sit to Stand: Supervision            Ambulation/Gait Ambulation/Gait assistance: Min guard Gait Distance (Feet): 20 Feet Assistive device: Rolling walker (2 wheeled) Gait Pattern/deviations: Step-to pattern;Decreased stance time - left Gait velocity: reduced Gait velocity interpretation: <1.31 ft/sec, indicative of household ambulator General Gait Details: pt with short step to gait with reduced stance time on LLE, bearing weight through [employer]             Wheelchair Mobility     Modified Rankin (Stroke Patients Only)       Balance Overall balance assessment: Needs assistance Sitting-balance support: No upper extremity supported;Feet supported Sitting balance-Leahy Scale: Good Sitting balance - Comments: supervision   Standing balance support: Bilateral upper extremity supported Standing balance-Leahy Scale: Fair Standing balance comment: minG with BUE support                            Cognition Arousal/Alertness: Awake/alert Behavior During Therapy: WFL for tasks assessed/performed Overall Cognitive Status: Within Functional Limits for tasks assessed                                        Exercises Other Exercises Other Exercises: Repeated flexion in sitting 15 resp, mild improvement in back pain    General Comments General comments (skin integrity, edema, etc.): VSS, pacing      Pertinent Vitals/Pain Pain Assessment: Faces Faces Pain Scale: Hurts whole lot Pain Location: LLE into back Pain Descriptors / Indicators: Cramping;Moaning;Grimacing;Guarding Pain Intervention(s): Limited activity within patient's tolerance    Home Living                      Prior Function            PT Goals (current goals can now be found in the care plan section) Acute Rehab PT Goals Patient Stated Goal: Decreased leg pain; agreeable to d/c to daughter's home for initial 24/7 support  Progress towards PT goals: Not progressing toward goals - comment(limited by pain)    Frequency    Min 3X/week      PT Plan Current plan remains appropriate    Co-evaluation              AM-PAC PT "6 Clicks" Mobility   Outcome Measure  Help needed turning from your back to your side while in a flat bed without using bedrails?: None Help needed moving from lying on your back to sitting on the side of a flat bed without using bedrails?: None Help needed moving to and from a bed to a chair (including a wheelchair)?: A  Little Help needed standing up from a chair using your arms (e.g., wheelchair or bedside chair)?: None Help needed to walk in hospital room?: A Little Help needed climbing 3-5 steps with a railing? : A Little 6 Click Score: 21    End of Session Equipment Utilized During Treatment: (none) Activity Tolerance: Patient limited by pain Patient left: in bed;with call bell/phone within reach Nurse Communication: Mobility status PT Visit Diagnosis: Other abnormalities of gait and mobility (R26.89);Pain     Time: [id number] PT Time Calculation (min) (ACUTE ONLY): 23 min  Charges:  $Gait Training: 8-22 mins $Therapeutic Exercise: 8-22 mins                     Zenaida Niece, PT, DPT Acute Rehabilitation Pager: [phone]    Zenaida Niece 05/16/2019, 1:27 PM

## 2019-05-17 ENCOUNTER — Encounter (HOSPITAL_COMMUNITY): Payer: Self-pay | Admitting: Internal Medicine

## 2019-05-17 LAB — CBC WITH DIFFERENTIAL/PLATELET
Abs Immature Granulocytes: 0.01 10*3/uL (ref 0.00–0.07)
Basophils Absolute: 0 10*3/uL (ref 0.0–0.1)
Basophils Relative: 0 %
Eosinophils Absolute: 0.1 10*3/uL (ref 0.0–0.5)
Eosinophils Relative: 2 %
HCT: 32 % — ABNORMAL LOW (ref 39.0–52.0)
Hemoglobin: 10.9 g/dL — ABNORMAL LOW (ref 13.0–17.0)
Immature Granulocytes: 0 %
Lymphocytes Relative: 31 %
Lymphs Abs: 1.6 10*3/uL (ref 0.7–4.0)
MCH: 33.9 pg (ref 26.0–34.0)
MCHC: 34.1 g/dL (ref 30.0–36.0)
MCV: 99.4 fL (ref 80.0–100.0)
Monocytes Absolute: 0.5 10*3/uL (ref 0.1–1.0)
Monocytes Relative: 9 %
Neutro Abs: 3 10*3/uL (ref 1.7–7.7)
Neutrophils Relative %: 58 %
Platelets: 146 10*3/uL — ABNORMAL LOW (ref 150–400)
RBC: 3.22 MIL/uL — ABNORMAL LOW (ref 4.22–5.81)
RDW: 14.3 % (ref 11.5–15.5)
WBC: 5.2 10*3/uL (ref 4.0–10.5)
nRBC: 0 % (ref 0.0–0.2)

## 2019-05-17 LAB — IRON AND TIBC
Iron: 39 ug/dL — ABNORMAL LOW (ref 45–182)
Saturation Ratios: 11 % — ABNORMAL LOW (ref 17.9–39.5)
TIBC: 367 ug/dL (ref 250–450)
UIBC: 328 ug/dL

## 2019-05-17 LAB — RENAL FUNCTION PANEL
Albumin: 3.4 g/dL — ABNORMAL LOW (ref 3.5–5.0)
Anion gap: 8 (ref 5–15)
BUN: 15 mg/dL (ref 8–23)
CO2: 27 mmol/L (ref 22–32)
Calcium: 8.8 mg/dL — ABNORMAL LOW (ref 8.9–10.3)
Chloride: 94 mmol/L — ABNORMAL LOW (ref 98–111)
Creatinine, Ser: 0.95 mg/dL (ref 0.61–1.24)
GFR calc Af Amer: >60 mL/min (ref >60)
GFR calc non Af Amer: >60 mL/min (ref >60)
Glucose, Bld: 90 mg/dL (ref 70–99)
Phosphorus: 3.7 mg/dL (ref 2.5–4.6)
Potassium: 4.4 mmol/L (ref 3.5–5.1)
Sodium: 129 mmol/L — ABNORMAL LOW (ref 135–145)

## 2019-05-17 LAB — OSMOLALITY, URINE: Osmolality, Ur: 814 mOsm/kg (ref 300–900)

## 2019-05-17 LAB — PROTIME-INR
INR: 3.2 — ABNORMAL HIGH (ref 0.8–1.2)
Prothrombin Time: 33.1 seconds — ABNORMAL HIGH (ref 11.4–15.2)

## 2019-05-17 LAB — SODIUM, URINE, RANDOM: Sodium, Ur: 47 mmol/L

## 2019-05-17 LAB — MAGNESIUM: Magnesium: 1.9 mg/dL (ref 1.7–2.4)

## 2019-05-17 LAB — FERRITIN: Ferritin: 169 ng/mL (ref 24–336)

## 2019-05-17 MED ORDER — SODIUM CHLORIDE 0.9 % IV SOLN: Freq: Once | INTRAVENOUS | Status: AC

## 2019-05-17 NOTE — Consult Note (Addendum)
Reason for Consult: Low back pain radiating around to anterior left thigh, T12 and L2 compression fractures Referring Physician: Cherylann Ratel, DO  Adam Richards is an 85 y.o. right-handed white male.  HPI: Patient admitted 3 days ago because of low back pain radiating around the lateral left hip into the anterior left thigh.  Patient explains that his symptoms developed 4 days ago.  He does exercises for his low back on a daily basis, but the day before the pain began he did more exercise than usual.  He set up an appointment to see his primary physician 2 days ago, but the pain was so severe 3 days ago that he came to the emergency room, and was admitted to the [facility] hospital[facility] service.  CT angio of the chest and abdomen was performed, and revealed compression fractures of [id number] and L2, of uncertain age.  Notes indicate that orthopedic surgery [place], [place]) was consulted, but declined to see the patient and recommended neurosurgical consultation.  Patient has been working with physical therapy since hospitalization, but back and left lumbar radicular pain has persisted.  Dr. Marylyn Ishihara quested consultation today.  Patient reports 2 previous episodes of back pain.  Those occurred 5 years ago and 7 years ago.  He explains that at that time he was found to have fractures in the spine.  He has had no history of previous back surgery.  He does not describe any radicular numbness, paresthesias, or weakness in the lower extremities.  Past Medical History:  Past Medical History:  Diagnosis Date  . A-fib ([place])   . Hypertension     Past Surgical History:  Past Surgical History:  Procedure Laterality Date  . KNEE SURGERY     "removed crystals"  . TONSILLECTOMY AND ADENOIDECTOMY    . UMBILICAL HERNIA REPAIR      Family History:  Family History  Problem Relation Age of Onset  . Heart failure Mother   . Cancer Father   . Prostate cancer Father   . Cancer Brother   . Stroke Maternal Grandmother   .  Tuberculosis Maternal Grandfather     Social History: Patient is retired.  He is widowed.  He lives by himself in an apartment.  His daughter lives here in [place].  He smokes small cigars 2 or 3 times a week if the weather is good.  He drinks wine very rarely.  He does not describe any history of substance abuse.  Allergies: No Known Allergies  Medications: I have reviewed the patient's current medications.  Notably he is anticoagulated with Coumadin for longstanding atrial fibrillation (anticoagulated since the 1990s, currently followed by Dr. Percival Spanish at [facility], he was last seen about 3 months ago, according to his recollection).  ROS: Notable for those difficulties described in his history of present illness and past medical history, but is otherwise unremarkable.  Physical Examination: Well developed well-nourished white male in no acute distress. Blood pressure 124/77, pulse 73, temperature 98.1 F (36.7 C), temperature source Oral, resp. rate 17, height 5\' 10"  (1.778 m), weight 88.5 kg, SpO2 97 %.  Extremity: Moderate edema of the legs ankles and feet.  No clubbing or cyanosis. Musculoskeletal: No tenderness palpation or percussion over the thoracic or lumbar spinous processes, nor is there any tenderness to palpation along the parathoracic and paralumbar musculature.  Straight leg raising negative bilaterally.  Neurological Examination: Mental Status Examination: Awake and alert, oriented.  Speech fluent, following commands.  Motor Examination: 5/5 strength in  the iliopsoas, quadriceps, dorsiflexor, EHL, and plantar flexor bilaterally. Sensory Examination: Intact to pinprick and light touch in the legs and feet bilaterally. Reflex Examination:   Absent at the quadriceps and gastrocnemius bilaterally.  Toes downgoing bilaterally. Gait and Stance Examination: Not tested due to the nature of the patient's condition (pain).   Results for orders placed or performed  during the hospital encounter of 05/14/19 (from the past 48 hour(s))  Basic metabolic panel     Status: Abnormal   Collection Time: 05/15/19  5:57 PM  Result Value Ref Range   Sodium 123 (L) 135 - 145 mmol/L   Potassium 4.7 3.5 - 5.1 mmol/L    Comment: NO VISIBLE HEMOLYSIS   Chloride 90 (L) 98 - 111 mmol/L   CO2 23 22 - 32 mmol/L   Glucose, Bld 111 (H) 70 - 99 mg/dL   BUN 16 8 - 23 mg/dL   Creatinine, Ser 0.92 0.61 - 1.24 mg/dL   Calcium 8.9 8.9 - 10.3 mg/dL   GFR calc non Af Amer >60 >60 mL/min   GFR calc Af Amer >60 >60 mL/min   Anion gap 10 5 - 15    Comment: Performed at [facility], [place] [address], [place], [place] [address]  Protime-INR     Status: Abnormal   Collection Time: 05/16/19  3:21 AM  Result Value Ref Range   Prothrombin Time 30.1 (H) 11.4 - 15.2 seconds   INR 2.9 (H) 0.8 - 1.2    Comment: (NOTE) INR goal varies based on device and disease states. Performed at [facility], [place] [address], [place], [place] [id number]   Basic metabolic panel     Status: Abnormal   Collection Time: 05/16/19  3:21 AM  Result Value Ref Range   Sodium 126 (L) 135 - 145 mmol/L   Potassium 4.2 3.5 - 5.1 mmol/L   Chloride 93 (L) 98 - 111 mmol/L   CO2 25 22 - 32 mmol/L   Glucose, Bld 88 70 - 99 mg/dL   BUN 17 8 - 23 mg/dL   Creatinine, Ser 0.76 0.61 - 1.24 mg/dL   Calcium 8.8 (L) 8.9 - 10.3 mg/dL   GFR calc non Af Amer >60 >60 mL/min   GFR calc Af Amer >60 >60 mL/min   Anion gap 8 5 - 15    Comment: Performed at [facility] [address] [place],  [address]  CBC with Differential/Platelet     Status: Abnormal   Collection Time: 05/16/19  3:21 AM  Result Value Ref Range   WBC 5.4 4.0 - 10.5 K/uL   RBC 3.23 (L) 4.22 - 5.81 MIL/uL   Hemoglobin 10.9 (L) 13.0 - 17.0 g/dL   HCT 31.4 (L) 39.0 - 52.0 %   MCV 97.2 80.0 - 100.0 fL   MCH 33.7 26.0 - 34.0 pg   MCHC 34.7 30.0 - 36.0 g/dL   RDW 14.2 11.5 - 15.5 %   Platelets 150 150 - 400 K/uL   nRBC 0.0 0.0  - 0.2 %   Neutrophils Relative % 55 %   Neutro Abs 3.0 1.7 - 7.7 K/uL   Lymphocytes Relative 34 %   Lymphs Abs 1.8 0.7 - 4.0 K/uL   Monocytes Relative 9 %   Monocytes Absolute 0.5 0.1 - 1.0 K/uL   Eosinophils Relative 2 %   Eosinophils Absolute 0.1 0.0 - 0.5 K/uL   Basophils Relative 0 %   Basophils Absolute 0.0 0.0 -  0.1 K/uL   Immature Granulocytes 0 %   Abs Immature Granulocytes 0.01 0.00 - 0.07 K/uL    Comment: Performed at [facility], [place] [address] [place], [place] [address]  Protime-INR     Status: Abnormal   Collection Time: 05/17/19  3:12 AM  Result Value Ref Range   Prothrombin Time 33.1 (H) 11.4 - 15.2 seconds   INR 3.2 (H) 0.8 - 1.2    Comment: (NOTE) INR goal varies based on device and disease states. Performed at [facility], [place] [address] [place], [place] [address]   Renal function panel     Status: Abnormal   Collection Time: 05/17/19  8:14 AM  Result Value Ref Range   Sodium 129 (L) 135 - 145 mmol/L   Potassium 4.4 3.5 - 5.1 mmol/L   Chloride 94 (L) 98 - 111 mmol/L   CO2 27 22 - 32 mmol/L   Glucose, Bld 90 70 - 99 mg/dL   BUN 15 8 - 23 mg/dL   Creatinine, Ser 0.95 0.61 - 1.24 mg/dL   Calcium 8.8 (L) 8.9 - 10.3 mg/dL   Phosphorus 3.7 2.5 - 4.6 mg/dL   Albumin 3.4 (L) 3.5 - 5.0 g/dL   GFR calc non Af Amer >60 >60 mL/min   GFR calc Af Amer >60 >60 mL/min   Anion gap 8 5 - 15    Comment: Performed at [facility] [address], [place], [place] [address]  CBC with Differential/Platelet     Status: Abnormal   Collection Time: 05/17/19  8:14 AM  Result Value Ref Range   WBC 5.2 4.0 - 10.5 K/uL   RBC 3.22 (L) 4.22 - 5.81 MIL/uL   Hemoglobin 10.9 (L) 13.0 - 17.0 g/dL   HCT 32.0 (L) 39.0 - 52.0 %   MCV 99.4 80.0 - 100.0 fL   MCH 33.9 26.0 - 34.0 pg   MCHC 34.1 30.0 - 36.0 g/dL   RDW 14.3 11.5 - 15.5 %   Platelets 146 (L) 150 - 400 K/uL   nRBC 0.0 0.0 - 0.2 %   Neutrophils Relative % 58 %   Neutro Abs 3.0 1.7 - 7.7 K/uL    Lymphocytes Relative 31 %   Lymphs Abs 1.6 0.7 - 4.0 K/uL   Monocytes Relative 9 %   Monocytes Absolute 0.5 0.1 - 1.0 K/uL   Eosinophils Relative 2 %   Eosinophils Absolute 0.1 0.0 - 0.5 K/uL   Basophils Relative 0 %   Basophils Absolute 0.0 0.0 - 0.1 K/uL   Immature Granulocytes 0 %   Abs Immature Granulocytes 0.01 0.00 - 0.07 K/uL    Comment: Performed at [facility], [address], [place], [place] [address]  Magnesium     Status: None   Collection Time: 05/17/19  8:14 AM  Result Value Ref Range   Magnesium 1.9 1.7 - 2.4 mg/dL    Comment: Performed at [facility] [address], [place], [place] [address]      Assessment/Plan: Patient who 4 days ago developed pain from the low back midline to the left side extending around the lateral left hip and into the anterior left thigh.  Pain was severe enough that he came to the emergency room.  CT scan angio was done which revealed what looks like most likely old T12 and L2 fractures.  Patient does report a history of fractures 5 years ago and 7 years ago.  His symptoms  in fact are most suggestive of a lumbar radiculopathy.  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  I spoke with Dr. Marylyn Ishihara when he consulted me, and subsequently explained to the patient, and his daughter by phone, that to evaluate his condition will require dedicated MRI scans of the thoracic spine without contrast and of the lumbar spine without contrast.  Dr. Marylyn Ishihara has ordered those MRIs.    I explained that I suspect the fractures are old (in fact looking back at chest x-rays from 2018 and 2019 I believe I can identify the old T12 wedge compression fracture).  Further recommendations will await the findings of the MRIs.  One question Dr. Marylyn Ishihara raised was regarding bracing due to the fractures, but certainly if the fractures are old, no bracing is  needed.  Certainly of note is that the patient is anticoagulated with Coumadin for atrial fibrillation.  If any intervention such as spinal injections and/or surgery were to be considered, he would need clearance from Dr. Percival Spanish to stop his Coumadin and anticoagulation periprocedurally.  I explained to Dr. Marylyn Ishihara, the patient, and his daughter that I will be out of town next week, returning on January 4, but that if neurosurgical intervention needs to be considered prior to then, my partners at [facility] will be available.  Hosie Spangle, MD 05/17/2019, 2:12 PM

## 2019-05-17 NOTE — Progress Notes (Addendum)
[facility]  PROGRESS NOTE    Jarol Randles  [id number] DOB: 1927/01/25 DOA: 05/14/2019 PCP: Wenda Low, MD   Brief Narrative:   The patient is a 85 yr old man who presented from home with complaints of severe back pain. The patient carries a past medical history significant for vertebral compression fracture, atrial fibrillation on coumadin, and hypertension. He states that the pain began a couple of days ago after he did some "back exercises which included arching his back. There is no saddle anesthesia or lower extremity weakness. He has no incontinence of bowel or bladder. He also had some complaints of palpitations, although no chest pain. He has had some shortness of breath with exertion lately and some increased lower extremity edema. CT angio of the chest and abdomen was performed to rule out dissection which was negative. 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 There was mild cardiomegaly with small pleural effusions. Tree in bud opacities in both lung bases were non-specific but could represent chronic lung changes. There were age-indeterminate compression deformities of T12 and L2 with 25 - 30% loss in vertebral body heiight. There is no retropulsion of fragments or severe canal stenosis.   05/17/19: Na+ is improving. Will get more fluids today. PT/OT rec HHPT/OT. Spoke with ortho about bracing, but deferred to neurosurgery. Have paged. Will d/w with them. Hopeful hope soon.    Assessment & Plan:   Active Problems:   Atrial fibrillation, chronic (HCC)   Back pain   Subtherapeutic international normalized ratio (INR)   Hyponatremia   Lumbar pain  Lumbar back pain     - Pt has a known history of compression fracture.      - Age-indeterminate T12 and L2 compression deformity is seen on CTA chest and abdomen.     - Pt is receiving pain control and muscle relaxants as well as a K-pad.     - PT/OT  onboard: rec HHPT/OT      - 05/17/19: Denies neurologic changes/loss of sensation in the groin or lower ext; he notes pain moving down left leg with movement     - have d/w ortho bracing; they are deferring to neurosurgery; have paged, awaiting discussion (Re: bracing/PT vs kyphoplasty)     - talked with neurosurgery; MR t & l spine ordered; appreciate assistance.   Atrial fibrillation with RVR     - atrial fibrillation with controlled rate on metoprolol; digoxin was held     - Coumadin for stroke prophylaxis as managed by pharmacy.  Subtherapeutic INR     - at admission INR was 1.9.     - pharm dosing; INR 2.9 today, monitor     - INR is 3.2 this AM; monitor, dosing per pharm  Hyponatremia     - Na+ 127 at admission, it's 126 today     - urine studies suggest some hypovolemia; will add 1L NS, and repeat studies     - 05/17/19: Na+ is up to 129; repeat urine Na/Osm; continue NS today  Normocytic anemia     - f/u iron studies     - no evience of bleed; Hgb is stable  DVT prophylaxis: Coumadin Code Status: FULL   Disposition Plan: Home with [facility] when Na+ improved  ROS:  Denies CP, N, V, ab pain, dyspnea . Remainder 10-pt ROS is negative for all not previously mentioned.  Subjective: "I was able to walk a little  but then the pain in the leg started up."  Objective: Vitals:   05/17/19 0230 05/17/19 0245 05/17/19 0535 05/17/19 1051  BP:   (!) 135/97 124/77  Pulse:   69 73  Resp: 14 12 15 17   Temp:   98.1 F (36.7 C) 98.1 F (36.7 C)  TempSrc:   Oral Oral  SpO2:   97% 97%  Weight:      Height:        Intake/Output Summary (Last 24 hours) at 05/17/2019 1215 Last data filed at 05/17/2019 0940 Gross per 24 hour  Intake 1409.3 ml  Output 526 ml  Net 883.3 ml   Filed Weights   05/14/19 2229 05/15/19 0211  Weight: 87.4 kg 88.5 kg    Examination:  General: 85 y.o. male resting in bed in NAD Cardiovascular: RRR, +S1, S2, no m/g/r Respiratory: CTABL, no w/r/r,  normal WOB GI: BS+, NDNT, no masses noted, no organomegaly noted MSK: No e/c/c Neuro: alert to name, follows commands Psyc: calm/cooperative   Data Reviewed: I have personally reviewed following labs and imaging studies.  CBC: Recent Labs  Lab 05/14/19 1822 05/15/19 0308 05/16/19 0321 05/17/19 0814  WBC 8.8 7.1 5.4 5.2  NEUTROABS  --   --  3.0 3.0  HGB 12.2* 11.0* 10.9* 10.9*  HCT 34.7* 32.4* 31.4* 32.0*  MCV 95.1 98.2 97.2 99.4  PLT 186 157 150 [id number]*   Basic Metabolic Panel: Recent Labs  Lab 05/14/19 1822 05/15/19 0308 05/15/19 1757 05/16/19 0321 05/17/19 0814  NA 127* 127* 123* 126* 129*  K 4.1 3.8 4.7 4.2 4.4  CL 93* 93* 90* 93* 94*  CO2 24 24 23 25 27   GLUCOSE 98 101* 111* 88 90  BUN 13 12 16 17 15   CREATININE 0.80 0.67 0.92 0.76 0.95  CALCIUM 9.3 8.9 8.9 8.8* 8.8*  MG  --   --   --   --  1.9  PHOS  --   --   --   --  3.7   GFR: Estimated Creatinine Clearance: 57.9 mL/min (by C-G formula based on SCr of 0.95 mg/dL). Liver Function Tests: Recent Labs  Lab 05/14/19 1822 05/17/19 0814  AST 28  --   ALT 20  --   ALKPHOS 93  --   BILITOT 1.0  --   PROT 6.7  --   ALBUMIN 4.1 3.4*   Recent Labs  Lab 05/14/19 1822  LIPASE 24   No results for input(s): AMMONIA in the last 168 hours. Coagulation Profile: Recent Labs  Lab 05/14/19 1900 05/15/19 0308 05/16/19 0321 05/17/19 0312  INR 1.9* 2.0* 2.9* 3.2*   Cardiac Enzymes: No results for input(s): CKTOTAL, CKMB, CKMBINDEX, TROPONINI in the last 168 hours. BNP (last 3 results) No results for input(s): PROBNP in the last 8760 hours. HbA1C: No results for input(s): HGBA1C in the last 72 hours. CBG: No results for input(s): GLUCAP in the last 168 hours. Lipid Profile: No results for input(s): CHOL, HDL, LDLCALC, TRIG, CHOLHDL, LDLDIRECT in the last 72 hours. Thyroid Function Tests: No results for input(s): TSH, T4TOTAL, FREET4, T3FREE, THYROIDAB in the last 72 hours. Anemia Panel: No results for  input(s): VITAMINB12, FOLATE, FERRITIN, TIBC, IRON, RETICCTPCT in the last 72 hours. Sepsis Labs: Recent Labs  Lab 05/14/19 1822  LATICACIDVEN 1.7    Recent Results (from the past 240 hour(s))  SARS CORONAVIRUS 2 (TAT 6-24 HRS) Nasopharyngeal Nasopharyngeal Swab     Status: None   Collection Time: 05/15/19  4:10 AM  Specimen: Nasopharyngeal Swab  Result Value Ref Range Status   SARS Coronavirus 2 NEGATIVE NEGATIVE Final    Comment: (NOTE) SARS-CoV-2 target nucleic acids are NOT DETECTED. The SARS-CoV-2 RNA is generally detectable in upper and lower respiratory specimens during the acute phase of infection. Negative results do not preclude SARS-CoV-2 infection, do not rule out co-infections with other pathogens, and should not be used as the sole basis for treatment or other patient management decisions. Negative results must be combined with clinical observations, patient history, and epidemiological information. The expected result is Negative. Fact Sheet for Patients: SugarRoll.be Fact Sheet for Healthcare Providers: https://www.woods-mathews.com/ This test is not yet approved or cleared by the [place] FDA and  has been authorized for detection and/or diagnosis of SARS-CoV-2 by FDA under an Emergency Use Authorization (EUA). This EUA will remain  in effect (meaning this test can be used) for the duration of the COVID-19 declaration under Section 56 4(b)(1) of the Act, 21 U.S.C. section 360bbb-3(b)(1), unless the authorization is terminated or revoked sooner. Performed at [facility], [place] [address], [place], [place] [address]       Radiology Studies: No results found.   Scheduled Meds: . docusate sodium  200 mg Oral Daily  . dutasteride  0.5 mg Oral Daily  . methocarbamol  500 mg Oral QID  . metoprolol tartrate  25 mg Oral BID  . tamsulosin  0.4 mg Oral Daily  . Warfarin - Pharmacist Dosing Inpatient   Does not  apply q1800   Continuous Infusions: . sodium chloride       LOS: 2 days    Time spent: 25 minutes spent in the coordination of care today.    Jonnie Finner, DO [facility]  If 7PM-7AM, please contact night-coverage [email or web address] 05/17/2019, 12:15 PM

## 2019-05-17 NOTE — Progress Notes (Signed)
Physical Therapy Treatment Patient Details Name: Adam Richards MRN: [id number] DOB: 12/19/1926 Today's Date: 05/17/2019    History of Present Illness Pt is a 85 y.o. male admitted from [employer] on 05/14/19 with c/o severe back pain after doing "some back exercises," also with c/o of SOB and palpitations. CT angio negative for dissection. Imaging with age indeterminate compression deformities at T12 and L2. PMH includes vertebral compression fx, afib on coumadin, HTN.   PT Comments    Pt slowly progressing with mobility. Remains limited by back pain radiating into LLE, awaiting lumbar MRI. Able to tolerate transfer training and therex with min guard to minA. Pt motivated to participate and regain PLOF.   Follow Up Recommendations  Home health PT;Supervision/Assistance - 24 hour     Equipment Recommendations  None recommended by PT(3in1 delivered)    Recommendations for Other Services       Precautions / Restrictions Precautions Precautions: Fall Restrictions Weight Bearing Restrictions: No    Mobility  Bed Mobility Overal bed mobility: Modified Independent             General bed mobility comments: HOB elevated, extra time with c/o pain  Transfers Overall transfer level: Needs assistance Equipment used: Rolling walker (2 wheeled) Transfers: Sit to/from Stand Sit to Stand: Min assist;Min guard         General transfer comment: Initial minA for trunk elevation from EOB to RW; performed 6x repeated sit<>stand from recliner with min guard, good hand placement without cues  Ambulation/Gait Ambulation/Gait assistance: Min guard Gait Distance (Feet): 24 Feet Assistive device: Rolling walker (2 wheeled) Gait Pattern/deviations: Step-through pattern;Decreased stride length;Decreased weight shift to left   Gait velocity interpretation: <1.31 ft/sec, indicative of household Metallurgist Rankin (Stroke Patients  Only)       Balance Overall balance assessment: Needs assistance Sitting-balance support: No upper extremity supported;Feet supported Sitting balance-Leahy Scale: Good     Standing balance support: Bilateral upper extremity supported Standing balance-Leahy Scale: Poor Standing balance comment: Reliant on UE support                            Cognition Arousal/Alertness: Awake/alert Behavior During Therapy: WFL for tasks assessed/performed Overall Cognitive Status: Within Functional Limits for tasks assessed                                 General Comments: WFL for simple tasks; some memory and processing deficits likely baseline cognition      Exercises Other Exercises Other Exercises: HEP handout provided and performed - LAQ, seated hip flex, heel/toe raises, lumbar flexion    General Comments        Pertinent Vitals/Pain Pain Assessment: Faces Faces Pain Scale: Hurts little more Pain Location: Back into LLE Pain Descriptors / Indicators: Shooting;Guarding Pain Intervention(s): Monitored during session    Home Living                      Prior Function            PT Goals (current goals can now be found in the care plan section) Progress towards PT goals: Progressing toward goals    Frequency    Min 3X/week      PT Plan Current plan remains appropriate  Co-evaluation              AM-PAC PT "6 Clicks" Mobility   Outcome Measure  Help needed turning from your back to your side while in a flat bed without using bedrails?: None Help needed moving from lying on your back to sitting on the side of a flat bed without using bedrails?: None Help needed moving to and from a bed to a chair (including a wheelchair)?: A Little Help needed standing up from a chair using your arms (e.g., wheelchair or bedside chair)?: A Little Help needed to walk in hospital room?: A Little Help needed climbing 3-5 steps with a railing? :  A Little 6 Click Score: 20    End of Session   Activity Tolerance: Patient tolerated treatment well;Patient limited by pain Patient left: in chair;with call bell/phone within reach;with chair alarm set Nurse Communication: Mobility status PT Visit Diagnosis: Other abnormalities of gait and mobility (R26.89);Pain     Time: [id number] PT Time Calculation (min) (ACUTE ONLY): 24 min  Charges:  $Therapeutic Exercise: 8-22 mins $Therapeutic Activity: 8-22 mins                    Mabeline Caras, PT, DPT Acute Rehabilitation Services  Pager [phone] Office [place] 05/17/2019, 5:36 PM

## 2019-05-17 NOTE — Progress Notes (Signed)
Plan of care reviewed. Pt's progressing. BP  stable, EKG Atrial fibrillation on monitor, HR 60s-70s. SPO2 96% on room air.  At 01:16am, CCMD called that Pt had HR 38, but unsustained bradycardia. Observed HR at night 50s-60s.when he's sleeping. No acute distress noted.  Complained back pain, Norco given, tolerated well and rest well tonight. Continue to monitor.  Kennyth Lose, RN

## 2019-05-17 NOTE — Progress Notes (Signed)
ANTICOAGULATION CONSULT NOTE - Follow Up Consult  Pharmacy Consult for Warfarin Indication: atrial fibrillation  No Known Allergies  Patient Measurements: Height: 5\' 10"  (177.8 cm) Weight: 195 lb 1.7 oz (88.5 kg) IBW/kg (Calculated) : 73   Vital Signs: Temp: 98.1 F (36.7 C) (12/24 1051) Temp Source: Oral (12/24 1051) BP: 124/77 (12/24 1051) Pulse Rate: 73 (12/24 1051)  Labs: Recent Labs    05/15/19 0308 05/15/19 1757 05/16/19 0321 05/17/19 0312 05/17/19 0814  HGB 11.0*  --  10.9*  --  10.9*  HCT 32.4*  --  31.4*  --  32.0*  PLT 157  --  150  --  146*  LABPROT 22.7*  --  30.1* 33.1*  --   INR 2.0*  --  2.9* 3.2*  --   CREATININE 0.67 0.92 0.76  --  0.95    Estimated Creatinine Clearance: 57.9 mL/min (by C-G formula based on SCr of 0.95 mg/dL).  Assessment:  85 y/o M on warfarin PTA for afib.    PTA regimen is 2.5 mg daily (last dose per patient 12/20).   INR today is slightly supratherapeutic at 3.2. H&H today is stable since yesterday at 10.9-32, but plts have decreased now at 146. SrCr is relatively stable today at 0.95. Charted as only eating 25% of meals.   Goal of Therapy:  INR 2-3 Monitor platelets by anticoagulation protocol: Yes   Plan:  Will hold warfarin tonight  Daily PT/INR  Monitor for signs/symptoms of bleeding.   Thank you,   Eddie Candle, PharmD PGY-1 Pharmacy Resident   Please check amion for clinical pharmacist contact number

## 2019-05-18 ENCOUNTER — Inpatient Hospital Stay (HOSPITAL_COMMUNITY): Payer: Medicare Other

## 2019-05-18 LAB — CBC WITH DIFFERENTIAL/PLATELET
Abs Immature Granulocytes: 0.02 10*3/uL (ref 0.00–0.07)
Basophils Absolute: 0 10*3/uL (ref 0.0–0.1)
Basophils Relative: 0 %
Eosinophils Absolute: 0.1 10*3/uL (ref 0.0–0.5)
Eosinophils Relative: 1 %
HCT: 32.4 % — ABNORMAL LOW (ref 39.0–52.0)
Hemoglobin: 11 g/dL — ABNORMAL LOW (ref 13.0–17.0)
Immature Granulocytes: 0 %
Lymphocytes Relative: 33 %
Lymphs Abs: 1.8 10*3/uL (ref 0.7–4.0)
MCH: 33.5 pg (ref 26.0–34.0)
MCHC: 34 g/dL (ref 30.0–36.0)
MCV: 98.8 fL (ref 80.0–100.0)
Monocytes Absolute: 0.5 10*3/uL (ref 0.1–1.0)
Monocytes Relative: 9 %
Neutro Abs: 3.1 10*3/uL (ref 1.7–7.7)
Neutrophils Relative %: 57 %
Platelets: 157 10*3/uL (ref 150–400)
RBC: 3.28 MIL/uL — ABNORMAL LOW (ref 4.22–5.81)
RDW: 14.6 % (ref 11.5–15.5)
WBC: 5.5 10*3/uL (ref 4.0–10.5)
nRBC: 0 % (ref 0.0–0.2)

## 2019-05-18 LAB — RENAL FUNCTION PANEL
Albumin: 3.4 g/dL — ABNORMAL LOW (ref 3.5–5.0)
Anion gap: 9 (ref 5–15)
BUN: 14 mg/dL (ref 8–23)
CO2: 25 mmol/L (ref 22–32)
Calcium: 8.9 mg/dL (ref 8.9–10.3)
Chloride: 95 mmol/L — ABNORMAL LOW (ref 98–111)
Creatinine, Ser: 0.85 mg/dL (ref 0.61–1.24)
GFR calc Af Amer: >60 mL/min (ref >60)
GFR calc non Af Amer: >60 mL/min (ref >60)
Glucose, Bld: 89 mg/dL (ref 70–99)
Phosphorus: 3.4 mg/dL (ref 2.5–4.6)
Potassium: 4.7 mmol/L (ref 3.5–5.1)
Sodium: 129 mmol/L — ABNORMAL LOW (ref 135–145)

## 2019-05-18 LAB — PROTIME-INR
INR: 3.1 — ABNORMAL HIGH (ref 0.8–1.2)
Prothrombin Time: 31.7 seconds — ABNORMAL HIGH (ref 11.4–15.2)

## 2019-05-18 LAB — MAGNESIUM: Magnesium: 1.9 mg/dL (ref 1.7–2.4)

## 2019-05-18 IMAGING — MR MR LUMBAR SPINE W/O CM
4 of 5 series · 26 of 48 positions shown · non-contrast
Comparison: None.

CLINICAL DATA: Persistent mid and low back pain extending into the
left hip and anterior left thigh.

EXAM:
MRI LUMBAR SPINE WITHOUT CONTRAST
TECHNIQUE: Multiplanar, multisequence MR imaging of the lumbar spine was
performed. No intravenous contrast was administered.

[Series 18: T2 · sagittal · 4.0mm · 0.73mm/px · 6 of 17 slices shown (1 of 2)]
[im 1/17]
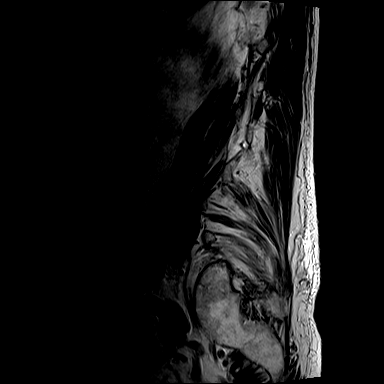
[im 4/17]
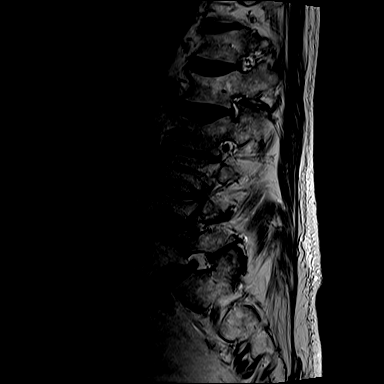
[im 7/17]
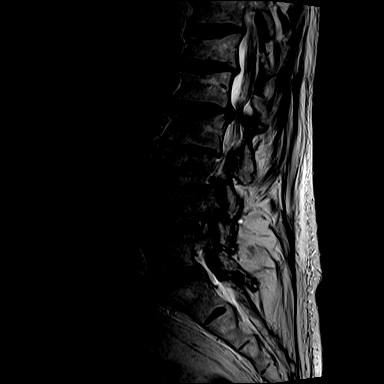
[im 10/17]
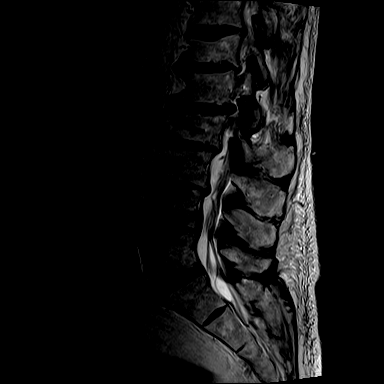
[im 13/17]
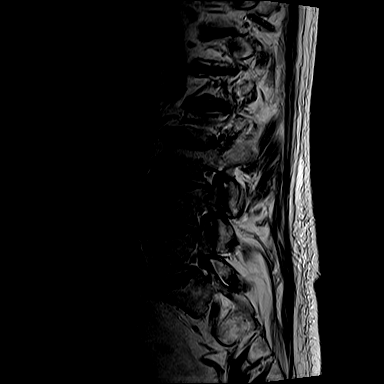
[im 17/17]
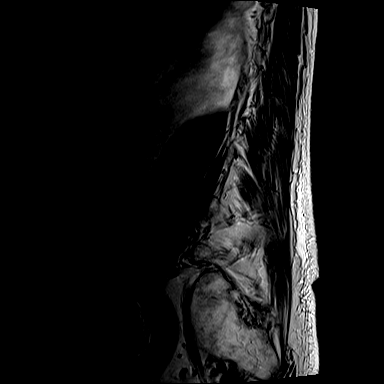

[Series 20: T1 · sagittal · 4.0mm · 0.88mm/px · 7 of 17 slices shown (1 of 2)]
[im 1/17]
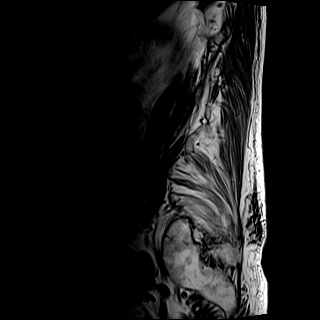
[im 3/17]
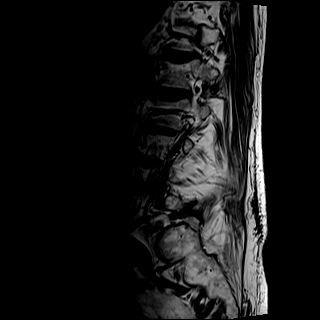
[im 6/17]
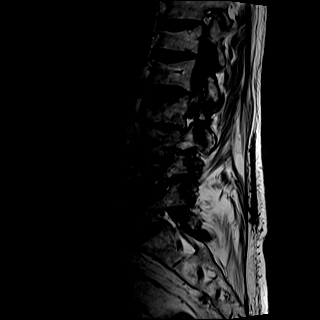
[im 9/17]
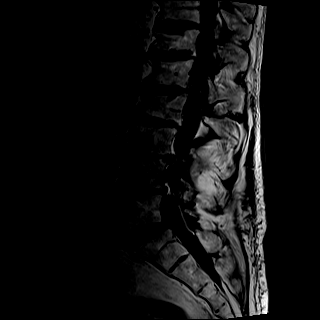
[im 11/17]
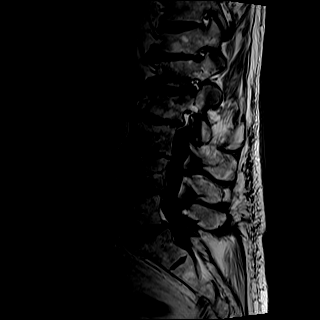
[im 14/17]
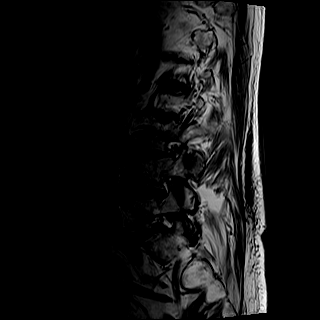
[im 17/17]
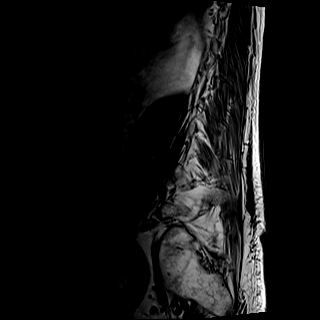

[Series 23: T2 · axial · 4.0mm · 0.57mm/px · z∈[-369,-174]mm · 8 of 34 slices shown (2 of 2)]
[im 1/34]
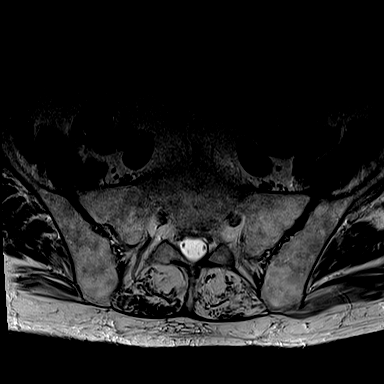
[im 6/34]
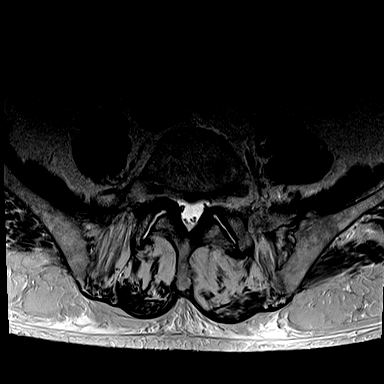
[im 11/34]
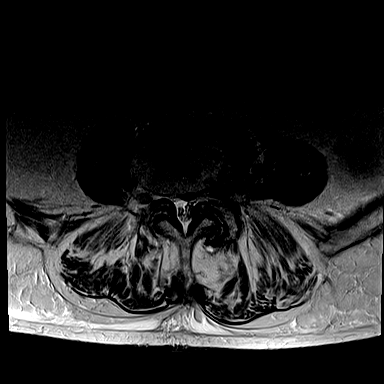
[im 16/34]
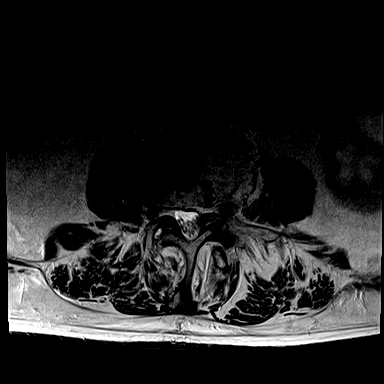
[im 18/34]
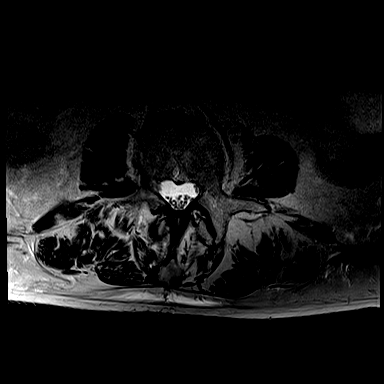
[im 23/34]
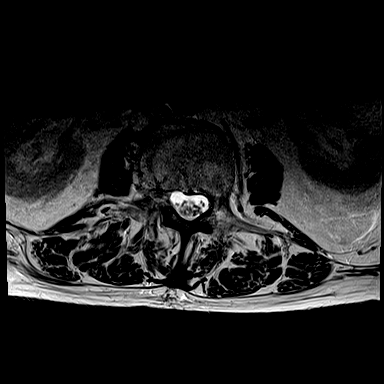
[im 28/34]
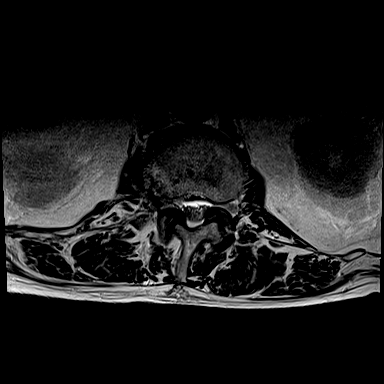
[im 34/34]
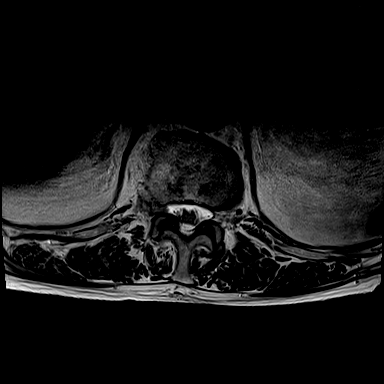

[Series 24: T1 · axial · 4.0mm · 0.34mm/px · z∈[-369,-203]mm · 5 of 34 slices shown (2 of 2)]
[im 1/34]
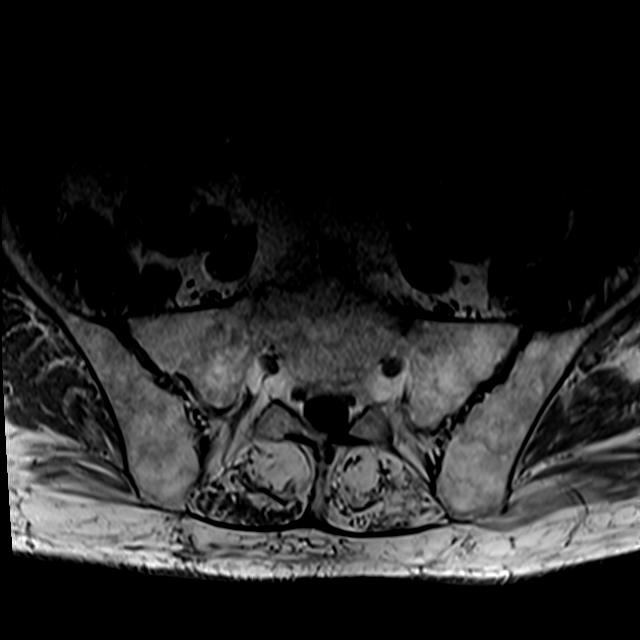
[im 6/34]
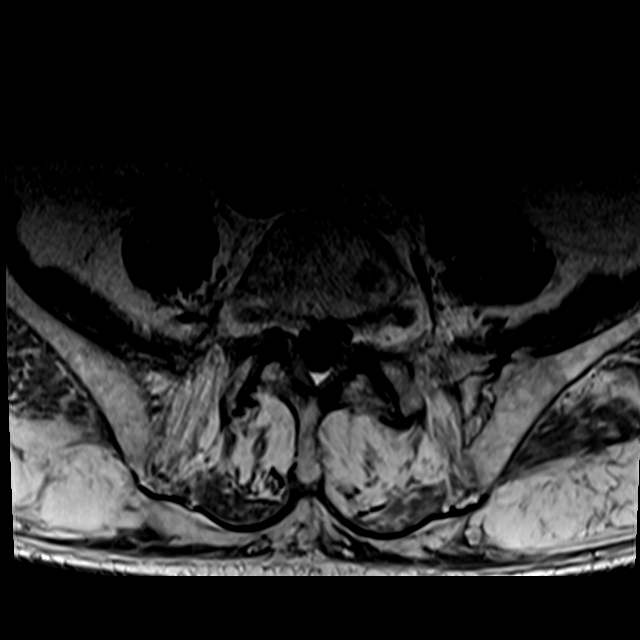
[im 11/34]
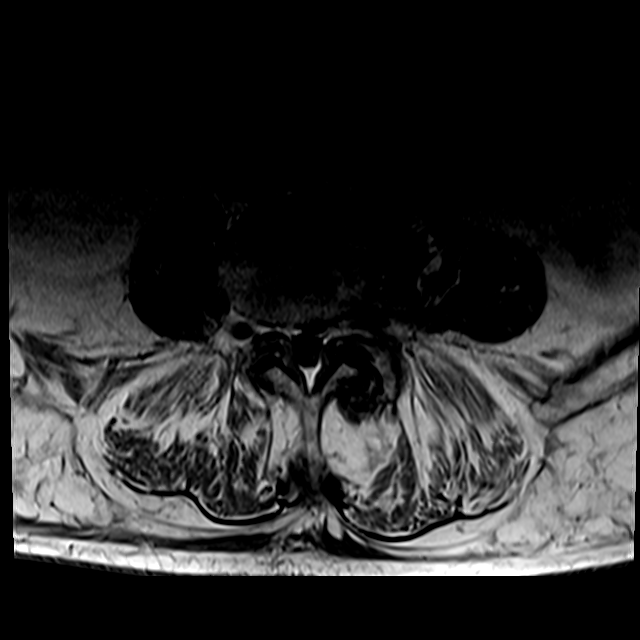
[im 18/34]
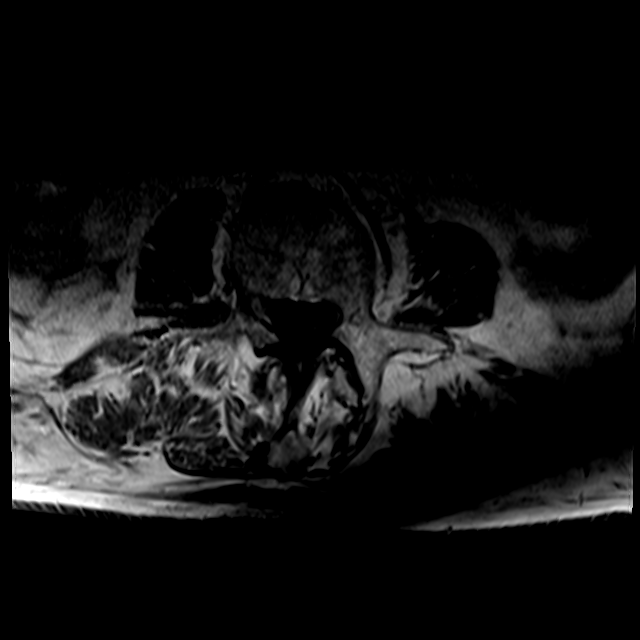
[im 28/34]
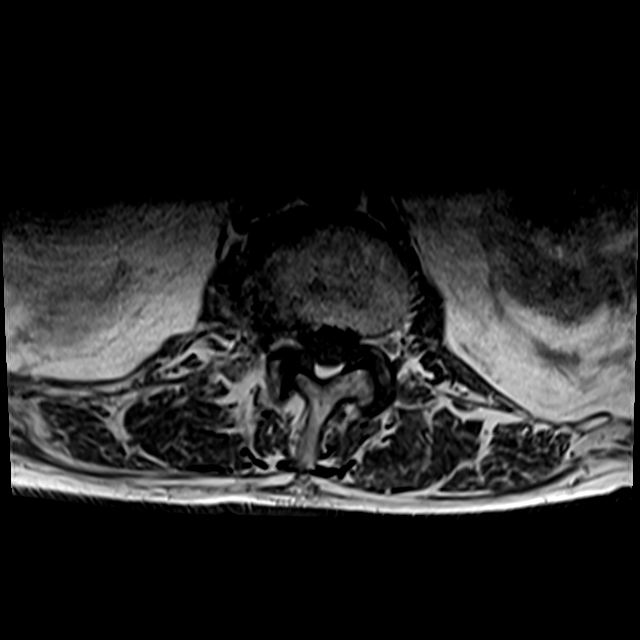

[26 of 48 positions shown; findings below may reference images not displayed]

FINDINGS: Segmentation:  Standard.

Alignment:  3 mm retrolisthesis of L2 on L3 and of L3 on L4.

Vertebrae: Old healed compression fracture of the superior endplate
of T12. Old benign Schmorl's node in the superior endplate of L2. No
acute fractures or bone destruction.

Conus medullaris and cauda equina: Conus extends to the L1 level.
Conus appears normal.

Paraspinal and other soft tissues: Negative.

Disc levels:

L1-2: Broad-based disc protrusion most prominent centrally and to
the right. Hypertrophy of the ligamentum flavum and facet joints and
congenitally short pedicles combine to create severe spinal
stenosis. The nerve roots are trapped at this level with some
redundancy above the stenosis. Moderate bilateral foraminal
stenosis. Right far lateral disc protrusion. Degenerative changes of
the vertebral endplates asymmetric to the right with some edema in
the endplates.

L2-3: Marked disc space narrowing. Slight retrolisthesis.
Broad-based endplate osteophyte touches the ventral aspect of the
thecal sac but there is no disc protrusion or focal neural
impingement. No foraminal stenosis.

L3-4: Marked disc space narrowing. Slight retrolisthesis.
Broad-based endplate osteophytes extend into both neural foramina.
Hypertrophy of the ligamentum flavum and facet joints combine to
create mild spinal stenosis and severe left foraminal stenosis.

L4-5: Marked disc space narrowing. There is a small disc extrusion
to the right of midline extending superiorly behind the body of L4
and into the left neural foramen. Combined with hypertrophy of the
ligamentum flavum and facet joints this creates severe left lateral
recess and foraminal stenosis which should affect the left L4 nerve.
Hypertrophy of the facet joints also creates mild spinal stenosis
and left lateral recess narrowing which could affect the left L5
nerve.

L5-S1: Tiny disc bulge into the right neural foramen with no neural
impingement. Moderate bilateral facet arthritis with hypertrophy of
the ligamentum flavum with slight compression of the posterolateral
aspects of the thecal sac without focal neural impingement.
IMPRESSION: 1. Severe spinal stenosis at L1-2 due to a combination of a
broad-based soft disc protrusion and hypertrophy of the ligamentum
flavum and facet joints.
2. Severe left foraminal stenosis at L3-4.
3. Soft disc extrusion and protrusion compresses the left L4 nerve
in the lateral recess and neural foramen at L4-5.

## 2019-05-18 IMAGING — MR MR THORACIC SPINE W/O CM
5 of 7 series · 20 of 48 positions shown · non-contrast
Comparison: CT angiogram of the chest dated [DATE]

CLINICAL DATA: Mid and lower back pain radiating to the left hip
and left anterior thigh.

EXAM:
MRI THORACIC SPINE WITHOUT CONTRAST
TECHNIQUE: Multiplanar, multisequence MR imaging of the thoracic spine was
performed. No intravenous contrast was administered.

[Series 14: T1 · sagittal · 6.0mm · 1.23mm/px · 2 of 9 slices shown (1 of 2)]
[im 1/9]
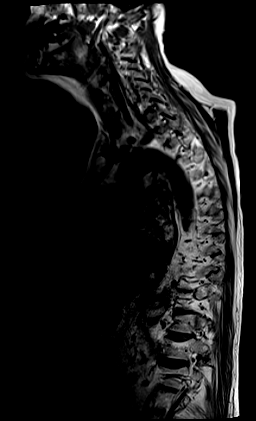
[im 9/9]
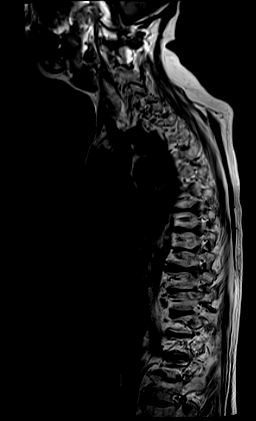

[Series 15: T2 · sagittal · 3.0mm · 0.76mm/px · 4 of 21 slices shown (1 of 2)]
[im 1/21]
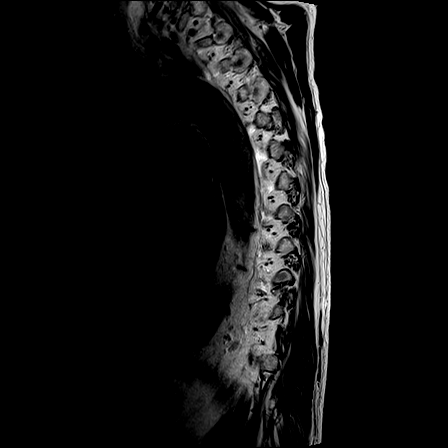
[im 7/21]
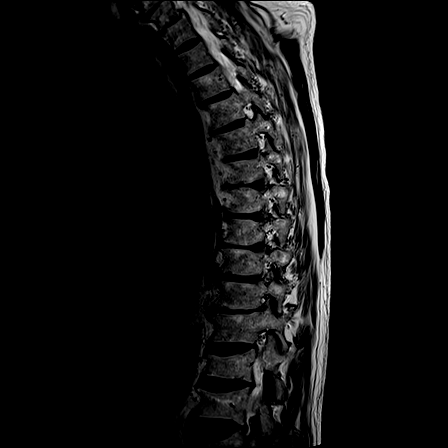
[im 14/21]
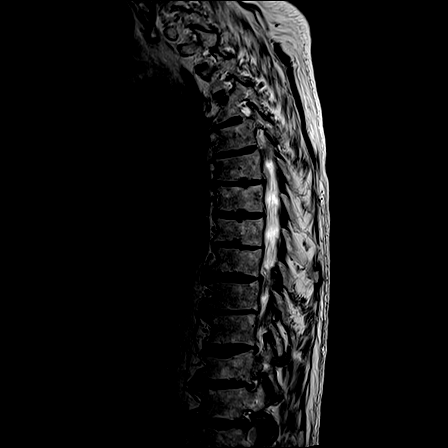
[im 21/21]
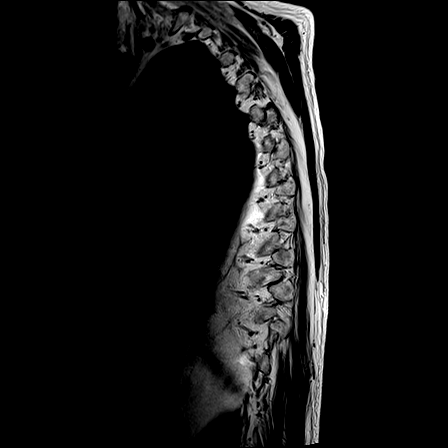

[Series 16: T1 · sagittal · 3.0mm · 0.76mm/px · 4 of 21 slices shown (2 of 2)]
[im 1/21]
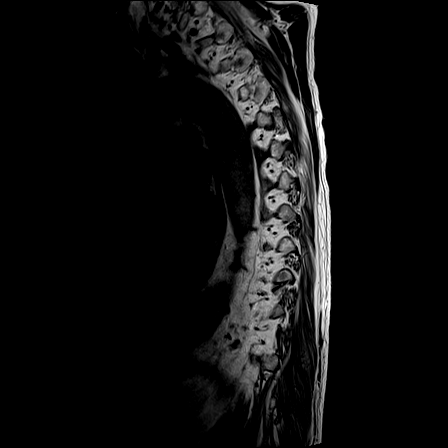
[im 7/21]
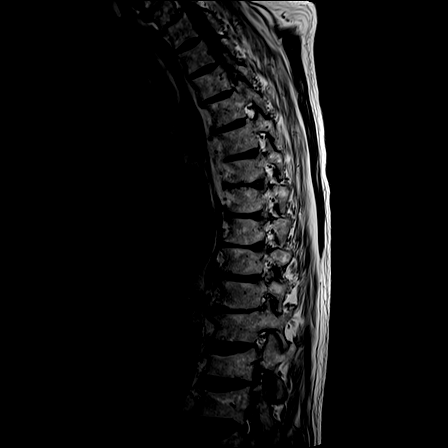
[im 14/21]
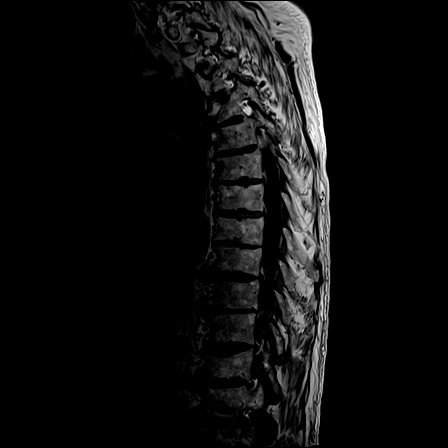
[im 21/21]
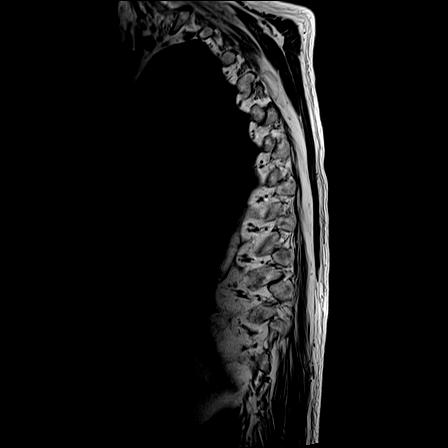

[Series 17: STIR · sagittal · 3.0mm · 0.38mm/px · 3 of 21 slices shown]
[im 1/21]
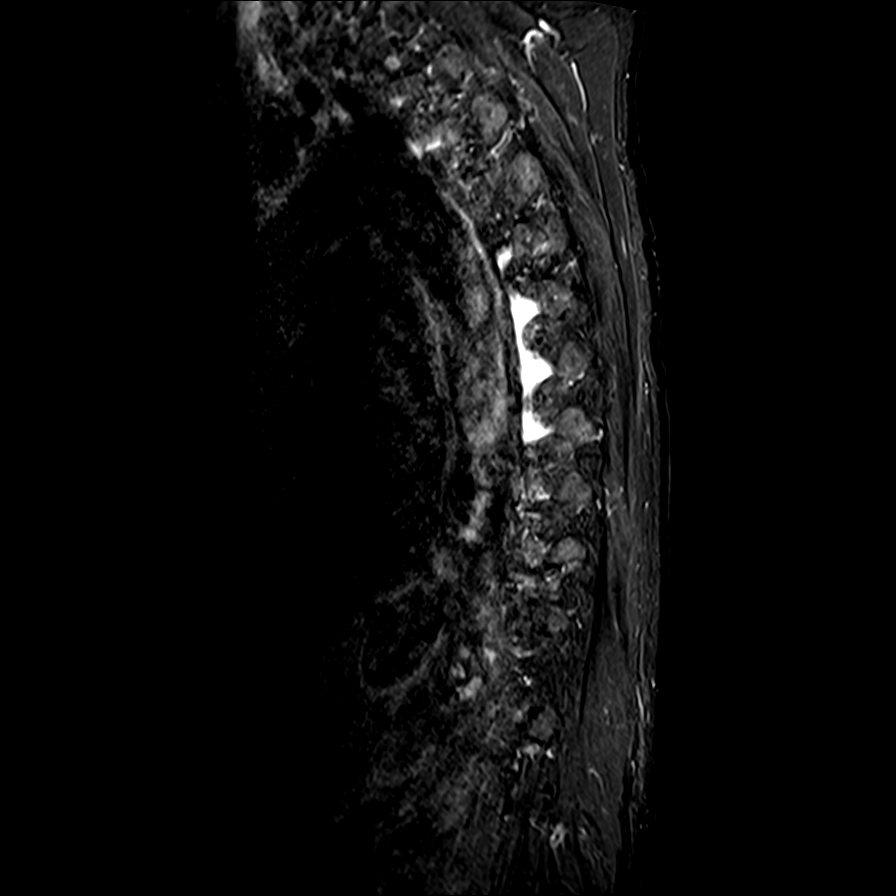
[im 7/21]
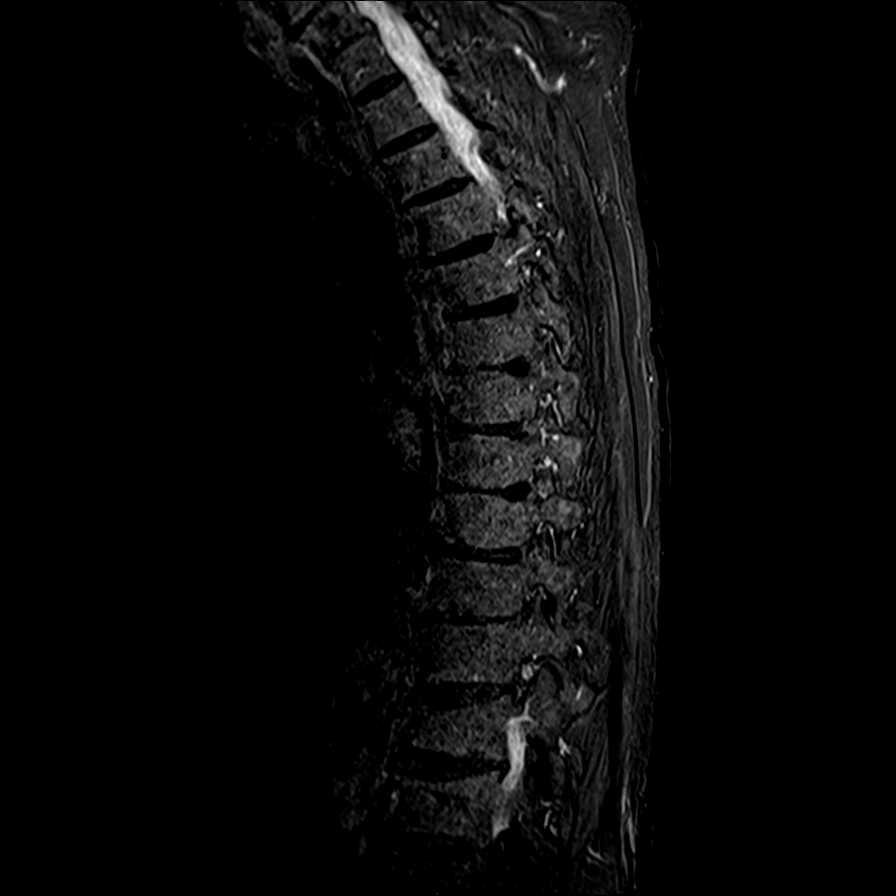
[im 14/21]
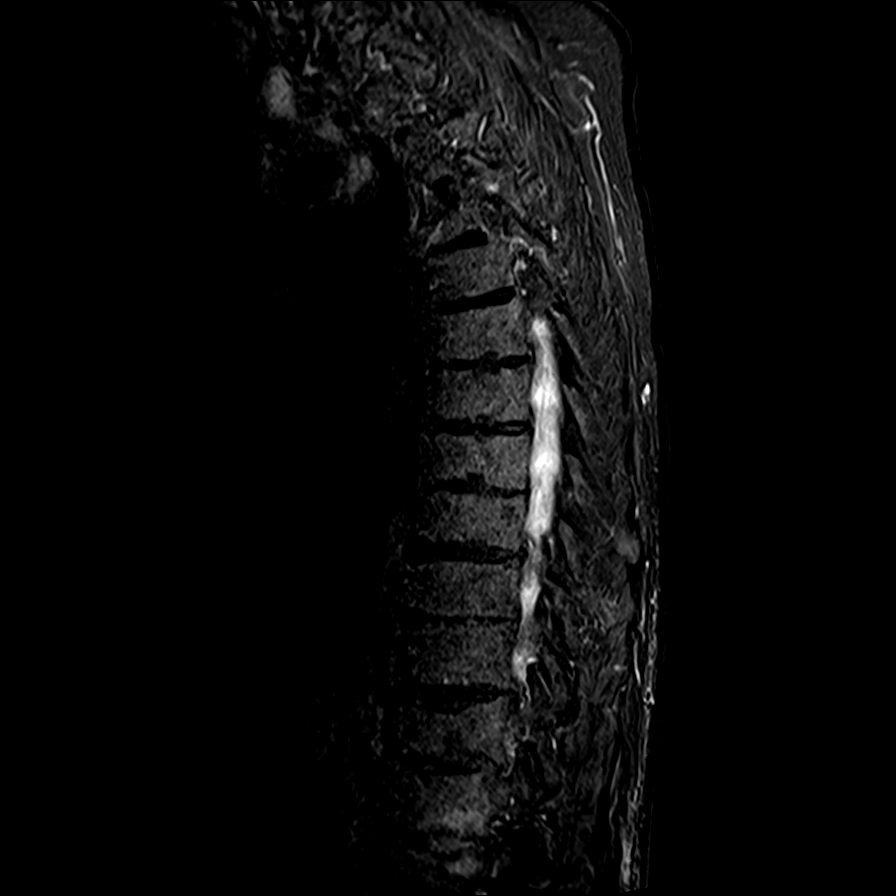

[Series 21: T2 · axial · 4.0mm · 0.59mm/px · z∈[-192,+24]mm · 7 of 39 slices shown (2 of 2)]
[im 1/39]
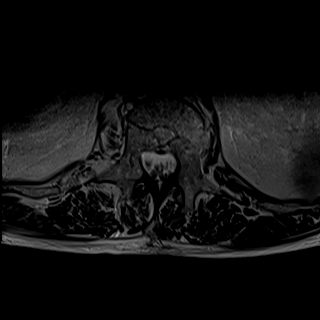
[im 7/39]
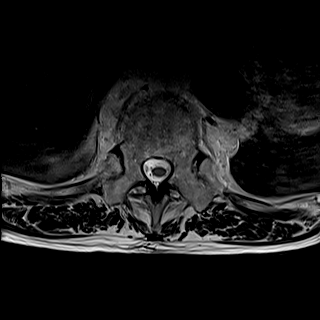
[im 13/39]
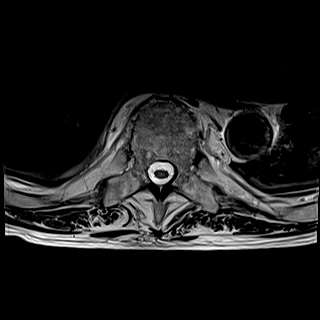
[im 20/39]
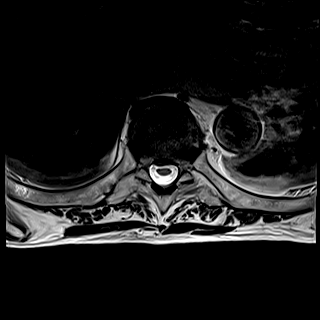
[im 26/39]
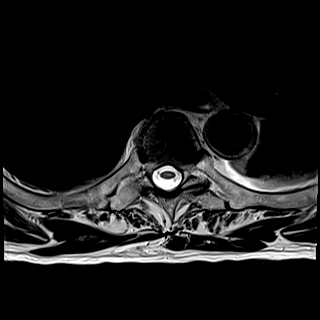
[im 32/39]
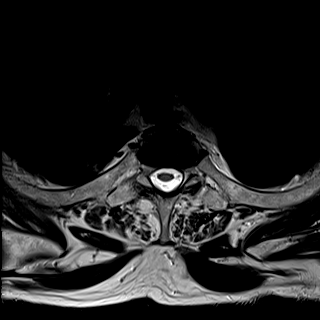
[im 39/39]
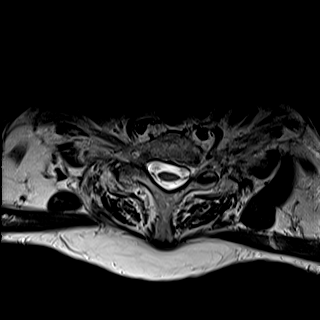

[20 of 48 positions shown; findings below may reference images not displayed]

FINDINGS: Alignment:  Physiologic.

Vertebrae: No acute fractures or bone destruction or discitis. Old
mild anterior wedge deformity of T12. Prominent Schmorl's node in
the superior endplate of L2, old.

Cord:  Normal signal and morphology.

Paraspinal and other soft tissues: Negative.

Disc levels:

T1-2 through T5-6: Disc space narrowing. No disc bulging protrusion.
No foraminal or spinal stenosis.

T6-7: Disc space narrowing. Tiny central disc protrusion with no
neural impingement.

T7-8 through T8-9: Disc space narrowing. Otherwise negative.

T9-10: Small broad-based disc bulge with accompanying osteophytes
without neural impingement.

T10-11: Disc space narrowing. No disc bulging or protrusion.

T11-12: Small disc protrusion just to the right of midline without
neural impingement. Hypertrophy of the right facet joint compresses
the right posterolateral aspect of the thecal sac but does not
compress the spinal cord. No foraminal stenosis. Old anterior wedge
deformity of T12, probably old healed compression fracture.

T12-L1: Broad-based disc bulge with accompanying osteophytes
asymmetric to the right without neural impingement. Moderate right
foraminal stenosis.

L1-2: See lumbar MRI report of [DATE].
IMPRESSION: 1. Multilevel degenerative disc disease in the thoracic spine
without neural impingement.
2. Moderate right foraminal stenosis at T12-L1.
3. Old healed compression fracture of T12.

## 2019-05-18 NOTE — Progress Notes (Signed)
ANTICOAGULATION CONSULT NOTE - Follow Up Consult  Pharmacy Consult for Warfarin Indication: atrial fibrillation  No Known Allergies  Patient Measurements: Height: 5\' 10"  (177.8 cm) Weight: 195 lb 1.7 oz (88.5 kg) IBW/kg (Calculated) : 73   Vital Signs: Temp: 98.3 F (36.8 C) (12/25 1239) Temp Source: Oral (12/25 1239) BP: 139/78 (12/25 1239) Pulse Rate: 74 (12/25 1239)  Labs: Recent Labs    05/16/19 0321 05/17/19 0312 05/17/19 0814 05/18/19 0249 05/18/19 0844  HGB 10.9*  --  10.9*  --  11.0*  HCT 31.4*  --  32.0*  --  32.4*  PLT 150  --  146*  --  157  LABPROT 30.1* 33.1*  --  31.7*  --   INR 2.9* 3.2*  --  3.1*  --   CREATININE 0.76  --  0.95  --  0.85    Estimated Creatinine Clearance: 64.7 mL/min (by C-G formula based on SCr of 0.85 mg/dL).  Assessment:  85 y/o M on warfarin PTA for afib.    PTA regimen is 2.5 mg daily (last dose per patient 12/20).   INR today is slightly supratherapeutic at 3.1. H&H today is stable since yesterday at 11-32.4, but plts have decreased now at 157. SrCr is relatively stable today at 0.85. Charted as eating 75% of meals.   Goal of Therapy:  INR 2-3 Monitor platelets by anticoagulation protocol: Yes   Plan:  Will hold warfarin tonight  Daily PT/INR  Monitor for signs/symptoms of bleeding.   Thank you,   Sherren Kerns, PharmD PGY1 Acute Care Pharmacy Resident  Please check amion for clinical pharmacist contact number

## 2019-05-18 NOTE — Progress Notes (Signed)
Subjective: Patient with continued low back pain radiating around the lateral left hip into the anterior left thigh.  MRIs of the thoracic and lumbar spine requested yesterday, still not done.  Continuing PT.  Objective: Vital signs in last 24 hours: Vitals:   05/17/19 1051 05/17/19 2050 05/18/19 0439 05/18/19 0848  BP: 124/77 (!) 154/80 (!) 151/96 (!) 154/87  Pulse: 73 71 70 73  Resp: 17 12 10 16   Temp: 98.1 F (36.7 C) 97.7 F (36.5 C) 97.9 F (36.6 C)   TempSrc: Oral Oral Oral   SpO2: 97% 96% 97%   Weight:      Height:        Intake/Output from previous day: 12/24 0701 - 12/25 0700 In: 210 [P.O.:210] Out: 430 [Urine:430] Intake/Output this shift: Total I/O In: -  Out: 100 [Urine:100]   CBC Recent Labs    05/17/19 0814 05/18/19 0844  WBC 5.2 5.5  HGB 10.9* 11.0*  HCT 32.0* 32.4*  PLT 146* 157   BMET Recent Labs    05/17/19 0814 05/18/19 0844  NA 129* 129*  K 4.4 4.7  CL 94* 95*  CO2 27 25  GLUCOSE 90 89  BUN 15 14  CREATININE 0.95 0.85  CALCIUM 8.8* 8.9    Studies/Results: No results found.  Assessment/Plan: Further neurosurgical recommendations once MRIs completed.  Continuing PT.  I will be out of town until January 4.  Please contact my on-call partners at [facility] as needed until then.  Hosie Spangle, MD 05/18/2019, 9:42 AM

## 2019-05-18 NOTE — Progress Notes (Signed)
Adam Richards  PROGRESS NOTE    Adam Richards  [id number] DOB: 12/06/1926 DOA: 05/14/2019 PCP: Wenda Low, MD   Brief Narrative:   The patient is a 85 yr old man who presented from home with complaints of severe back pain. The patient carries a past medical history significant for vertebral compression fracture, atrial fibrillation on coumadin, and hypertension. He states that the pain began a couple of days ago after he did some "back exercises which included arching his back. There is no saddle anesthesia or lower extremity weakness. He has no incontinence of bowel or bladder. He also had some complaints of palpitations, although no chest pain. He has had some shortness of breath with exertion lately and some increased lower extremity edema. CT angio of the chest and abdomen was performed to rule out dissection which was negative. 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 There was mild cardiomegaly with small pleural effusions. Tree in bud opacities in both lung bases were non-specific but could represent chronic lung changes. There were age-indeterminate compression deformities of T12 and L2 with 25 - 30% loss in vertebral body heiight. There is no retropulsion of fragments or severe canal stenosis.  05/18/19: Looks good today. Awaiting MRI. Appreciate neurosurgery input. Once MR obtained, will get final recs.   Assessment & Plan:   Active Problems:   Atrial fibrillation, chronic (HCC)   Back pain   Subtherapeutic international normalized ratio (INR)   Hyponatremia   Lumbar pain  Lumbar back pain - Pt has a known history of compression fracture.  - Age-indeterminate T12 and L2 compression deformity is seen on CTA chest and abdomen. - Pt is receiving pain control and muscle relaxants as well as a K-pad. - PT/OT onboard: rec HHPT/OT      - 05/17/19: Denies neurologic changes/loss of sensation  in the groin or lower ext; he notes pain moving down left leg with movement     - have d/w ortho bracing; they are deferring to neurosurgery; have paged, awaiting discussion (Re: bracing/PT vs kyphoplasty)     - talked with neurosurgery; MR t & l spine ordered; appreciate assistance.     - 05/18/19: MR still pending. Looks good today. TED hose ordered   Atrial fibrillation with RVR - atrial fibrillation with controlled rate on metoprolol; digoxin was held - Coumadin for stroke prophylaxis as managed by pharmacy.  Subtherapeutic INR - at admission INR was 1.9. - pharm dosing; INR 2.9 today, monitor     - INR is 3.1 this AM; monitor, dosing per pharm  Hyponatremia - Na+ 127 at admission, it's 126 today - urine studies suggest some hypovolemia; will add 1L NS, and repeat studies     - 05/18/19: Na+ stable, no symptoms, monitor  Normocytic anemia - f/u iron studies - no evience of bleed; Hgb is stable     - can start low dose iron supplementation at discharge  DVT prophylaxis: coumadin Code Status: FULL   Disposition Plan: TBD  Consultants:   Neurosurgery   ROS:  Denies CP, N, V, ab pain, dyspnea, HA, dizziness, weakness . Remainder 10-pt ROS is negative for all not previously mentioned.  Subjective: "I think I'm ok."  Objective: Vitals:   05/17/19 1051 05/17/19 2050 05/18/19 0439 05/18/19 0848  BP: 124/77 (!) 154/80 (!) 151/96 (!) 154/87  Pulse: 73 71 70 73  Resp: 17 12 10 16   Temp: 98.1 F (36.7 C) 97.7  F (36.5 C) 97.9 F (36.6 C)   TempSrc: Oral Oral Oral   SpO2: 97% 96% 97%   Weight:      Height:        Intake/Output Summary (Last 24 hours) at 05/18/2019 1109 Last data filed at 05/18/2019 0840 Gross per 24 hour  Intake 300 ml  Output 250 ml  Net 50 ml   Filed Weights   05/14/19 2229 05/15/19 0211  Weight: 87.4 kg 88.5 kg    Examination:  General: 85 y.o. male resting in bed in NAD Cardiovascular: RRR, +S1, S2,  no m/g/r, equal pulses throughout Respiratory: CTABL, no w/r/r, normal WOB GI: BS+, NDNT, soft MSK: No e/c/c Neuro: A&O x 3, no focal deficits Psyc: Appropriate interaction and affect, calm/cooperative   Data Reviewed: I have personally reviewed following labs and imaging studies.  CBC: Recent Labs  Lab 05/14/19 1822 05/15/19 0308 05/16/19 0321 05/17/19 0814 05/18/19 0844  WBC 8.8 7.1 5.4 5.2 5.5  NEUTROABS  --   --  3.0 3.0 3.1  HGB 12.2* 11.0* 10.9* 10.9* 11.0*  HCT 34.7* 32.4* 31.4* 32.0* 32.4*  MCV 95.1 98.2 97.2 99.4 98.8  PLT 186 157 150 146* [id number]   Basic Metabolic Panel: Recent Labs  Lab 05/15/19 0308 05/15/19 1757 05/16/19 0321 05/17/19 0814 05/18/19 0844  NA 127* 123* 126* 129* 129*  K 3.8 4.7 4.2 4.4 4.7  CL 93* 90* 93* 94* 95*  CO2 24 23 25 27 25   GLUCOSE 101* 111* 88 90 89  BUN 12 16 17 15 14   CREATININE 0.67 0.92 0.76 0.95 0.85  CALCIUM 8.9 8.9 8.8* 8.8* 8.9  MG  --   --   --  1.9 1.9  PHOS  --   --   --  3.7 3.4   GFR: Estimated Creatinine Clearance: 64.7 mL/min (by C-G formula based on SCr of 0.85 mg/dL). Liver Function Tests: Recent Labs  Lab 05/14/19 1822 05/17/19 0814 05/18/19 0844  AST 28  --   --   ALT 20  --   --   ALKPHOS 93  --   --   BILITOT 1.0  --   --   PROT 6.7  --   --   ALBUMIN 4.1 3.4* 3.4*   Recent Labs  Lab 05/14/19 1822  LIPASE 24   No results for input(s): AMMONIA in the last 168 hours. Coagulation Profile: Recent Labs  Lab 05/14/19 1900 05/15/19 0308 05/16/19 0321 05/17/19 0312 05/18/19 0249  INR 1.9* 2.0* 2.9* 3.2* 3.1*   Cardiac Enzymes: No results for input(s): CKTOTAL, CKMB, CKMBINDEX, TROPONINI in the last 168 hours. BNP (last 3 results) No results for input(s): PROBNP in the last 8760 hours. HbA1C: No results for input(s): HGBA1C in the last 72 hours. CBG: No results for input(s): GLUCAP in the last 168 hours. Lipid Profile: No results for input(s): CHOL, HDL, LDLCALC, TRIG, CHOLHDL, LDLDIRECT  in the last 72 hours. Thyroid Function Tests: No results for input(s): TSH, T4TOTAL, FREET4, T3FREE, THYROIDAB in the last 72 hours. Anemia Panel: Recent Labs    05/17/19 1524  FERRITIN 169  TIBC 367  IRON 39*   Sepsis Labs: Recent Labs  Lab 05/14/19 1822  LATICACIDVEN 1.7    Recent Results (from the past 240 hour(s))  SARS CORONAVIRUS 2 (TAT 6-24 HRS) Nasopharyngeal Nasopharyngeal Swab     Status: None   Collection Time: 05/15/19  4:10 AM   Specimen: Nasopharyngeal Swab  Result Value Ref Range Status   SARS  Coronavirus 2 NEGATIVE NEGATIVE Final    Comment: (NOTE) SARS-CoV-2 target nucleic acids are NOT DETECTED. The SARS-CoV-2 RNA is generally detectable in upper and lower respiratory specimens during the acute phase of infection. Negative results do not preclude SARS-CoV-2 infection, do not rule out co-infections with other pathogens, and should not be used as the sole basis for treatment or other patient management decisions. Negative results must be combined with clinical observations, patient history, and epidemiological information. The expected result is Negative. Fact Sheet for Patients: SugarRoll.be Fact Sheet for Healthcare Providers: https://www.woods-mathews.com/ This test is not yet approved or cleared by the [place] FDA and  has been authorized for detection and/or diagnosis of SARS-CoV-2 by FDA under an Emergency Use Authorization (EUA). This EUA will remain  in effect (meaning this test can be used) for the duration of the COVID-19 declaration under Section 56 4(b)(1) of the Act, 21 U.S.C. section 360bbb-3(b)(1), unless the authorization is terminated or revoked sooner. Performed at [facility], [place] [address] [place], [place] [address]       Radiology Studies: No results found.   Scheduled Meds: . docusate sodium  200 mg Oral Daily  . dutasteride  0.5 mg Oral Daily  . methocarbamol  500 mg  Oral QID  . metoprolol tartrate  25 mg Oral BID  . tamsulosin  0.4 mg Oral Daily  . Warfarin - Pharmacist Dosing Inpatient   Does not apply q1800   Continuous Infusions:   LOS: 3 days    Time spent: 25 minutes spent in the coordination of care today   Jonnie Finner, DO [facility]  If 7PM-7AM, please contact night-coverage [email or web address] 05/18/2019, 11:09 AM

## 2019-05-19 LAB — RENAL FUNCTION PANEL
Albumin: 3.1 g/dL — ABNORMAL LOW (ref 3.5–5.0)
Anion gap: 7 (ref 5–15)
BUN: 16 mg/dL (ref 8–23)
CO2: 27 mmol/L (ref 22–32)
Calcium: 8.7 mg/dL — ABNORMAL LOW (ref 8.9–10.3)
Chloride: 97 mmol/L — ABNORMAL LOW (ref 98–111)
Creatinine, Ser: 0.84 mg/dL (ref 0.61–1.24)
GFR calc Af Amer: >60 mL/min (ref >60)
GFR calc non Af Amer: >60 mL/min (ref >60)
Glucose, Bld: 85 mg/dL (ref 70–99)
Phosphorus: 3.7 mg/dL (ref 2.5–4.6)
Potassium: 4.2 mmol/L (ref 3.5–5.1)
Sodium: 131 mmol/L — ABNORMAL LOW (ref 135–145)

## 2019-05-19 LAB — CBC WITH DIFFERENTIAL/PLATELET
Abs Immature Granulocytes: 0.01 10*3/uL (ref 0.00–0.07)
Basophils Absolute: 0 10*3/uL (ref 0.0–0.1)
Basophils Relative: 1 %
Eosinophils Absolute: 0.1 10*3/uL (ref 0.0–0.5)
Eosinophils Relative: 3 %
HCT: 31.9 % — ABNORMAL LOW (ref 39.0–52.0)
Hemoglobin: 10.8 g/dL — ABNORMAL LOW (ref 13.0–17.0)
Immature Granulocytes: 0 %
Lymphocytes Relative: 38 %
Lymphs Abs: 1.9 10*3/uL (ref 0.7–4.0)
MCH: 33.3 pg (ref 26.0–34.0)
MCHC: 33.9 g/dL (ref 30.0–36.0)
MCV: 98.5 fL (ref 80.0–100.0)
Monocytes Absolute: 0.6 10*3/uL (ref 0.1–1.0)
Monocytes Relative: 12 %
Neutro Abs: 2.4 10*3/uL (ref 1.7–7.7)
Neutrophils Relative %: 46 %
Platelets: 146 10*3/uL — ABNORMAL LOW (ref 150–400)
RBC: 3.24 MIL/uL — ABNORMAL LOW (ref 4.22–5.81)
RDW: 14.5 % (ref 11.5–15.5)
WBC: 5.1 10*3/uL (ref 4.0–10.5)
nRBC: 0 % (ref 0.0–0.2)

## 2019-05-19 LAB — PROTIME-INR
INR: 2.4 — ABNORMAL HIGH (ref 0.8–1.2)
Prothrombin Time: 26.4 seconds — ABNORMAL HIGH (ref 11.4–15.2)

## 2019-05-19 LAB — MAGNESIUM: Magnesium: 1.9 mg/dL (ref 1.7–2.4)

## 2019-05-19 MED ORDER — HYDROCODONE-ACETAMINOPHEN 5-325 MG PO TABS: 1.0000 | ORAL_TABLET | Freq: Four times a day (QID) | ORAL | 0 refills | Status: AC | PRN

## 2019-05-19 MED ORDER — METHOCARBAMOL 500 MG PO TABS: 500.0000 mg | ORAL_TABLET | Freq: Four times a day (QID) | ORAL | 0 refills | Status: AC

## 2019-05-19 MED ORDER — WARFARIN SODIUM 2 MG PO TABS: 2.0000 mg | ORAL_TABLET | Freq: Once | ORAL | 0 refills | Status: DC

## 2019-05-19 MED ORDER — WARFARIN SODIUM 1 MG PO TABS
1.0000 mg | ORAL_TABLET | Freq: Once | ORAL | Status: AC
  Administered 2019-05-19: 1 mg via ORAL
  Filled 2019-05-19: qty 1

## 2019-05-19 NOTE — Progress Notes (Signed)
Subjective: The patient is alert and pleasant.  He complains of pain in his left leg when he stands or walks.  He feels a bit better today.  Objective: Vital signs in last 24 hours: Temp:  [97.4 F (36.3 C)-98.4 F (36.9 C)] 97.4 F (36.3 C) (12/26 0959) Pulse Rate:  [54-110] 75 (12/26 0959) Resp:  [14-18] 18 (12/26 0959) BP: (119-145)/(78-95) 145/88 (12/26 0959) SpO2:  [95 %-98 %] 96 % (12/26 0959) Estimated body mass index is 27.99 kg/m as calculated from the following:   Height as of this encounter: 5\' 10"  (1.778 m).   Weight as of this encounter: 88.5 kg.   Intake/Output from previous day: 12/25 0701 - 12/26 0700 In: 440 [P.O.:440] Out: 440 [Urine:440] Intake/Output this shift: Total I/O In: -  Out: 250 [Urine:250]  Physical exam the patient is alert and pleasant.  He is moving his lower extremities well.  He is ambulating.  I have reviewed the patient's thoracic MRI performed yesterday.  It demonstrates chronic degenerative changes, I do not see anything acute.  I have also reviewed the patient's lumbar MRI performed yesterday.  It demonstrates moderate to multifactoral spinal stenosis at L1-2.  He has a small herniated disks right greater left at L4-5.  Again I do not see anything acute to explain his left leg pain.  Lab Results: Recent Labs    05/18/19 0844 05/19/19 0333  WBC 5.5 5.1  HGB 11.0* 10.8*  HCT 32.4* 31.9*  PLT 157 146*   BMET Recent Labs    05/18/19 0844 05/19/19 0333  NA 129* 131*  K 4.7 4.2  CL 95* 97*  CO2 25 27  GLUCOSE 89 85  BUN 14 16  CREATININE 0.85 0.84  CALCIUM 8.9 8.7*    Studies/Results: MR THORACIC SPINE WO CONTRAST  Result Date: 05/18/2019 CLINICAL DATA:  Mid and lower back pain radiating to the left hip and left anterior thigh. EXAM: MRI THORACIC SPINE WITHOUT CONTRAST TECHNIQUE: Multiplanar, multisequence MR imaging of the thoracic spine was performed. No intravenous contrast was administered. COMPARISON:  CT angiogram  of the chest dated 05/14/2019 FINDINGS: Alignment:  Physiologic. Vertebrae: No acute fractures or bone destruction or discitis. Old mild anterior wedge deformity of T12. Prominent Schmorl's node in the superior endplate of L2, old. Cord:  Normal signal and morphology. Paraspinal and other soft tissues: Negative. Disc levels: T1-2 through T5-6: Disc space narrowing. No disc bulging protrusion. No foraminal or spinal stenosis. T6-7: Disc space narrowing. Tiny central disc protrusion with no neural impingement. T7-8 through T8-9: Disc space narrowing. Otherwise negative. T9-10: Small broad-based disc bulge with accompanying osteophytes without neural impingement. T10-11: Disc space narrowing. No disc bulging or protrusion. T11-12: Small disc protrusion just to the right of midline without neural impingement. Hypertrophy of the right facet joint compresses the right posterolateral aspect of the thecal sac but does not compress the spinal cord. No foraminal stenosis. Old anterior wedge deformity of T12, probably old healed compression fracture. T12-L1: Broad-based disc bulge with accompanying osteophytes asymmetric to the right without neural impingement. Moderate right foraminal stenosis. L1-2: See lumbar MRI report of 05/18/2019. IMPRESSION: 1. 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 2. Moderate right foraminal stenosis at T12-L1. 3. Old healed compression fracture of T12. Electronically Signed   By: Lorriane Shire M.D.   On: 05/18/2019 13:54   MR LUMBAR SPINE WO CONTRAST  Result Date: 05/18/2019 CLINICAL DATA:  Persistent mid and low back pain extending into the left  hip and anterior left thigh. EXAM: MRI LUMBAR SPINE WITHOUT CONTRAST TECHNIQUE: Multiplanar, multisequence MR imaging of the lumbar spine was performed. No intravenous contrast was administered. COMPARISON:  None. FINDINGS: Segmentation:  Standard. Alignment:  3 mm retrolisthesis of L2 on L3 and of L3 on  L4. Vertebrae: Old healed compression fracture of the superior endplate of [id number]. Old benign Schmorl's node in the superior endplate of L2. No acute fractures or bone destruction. Conus medullaris and cauda equina: Conus extends to the L1 level. Conus appears normal. Paraspinal and other soft tissues: Negative. Disc levels: L1-2: Broad-based disc protrusion most prominent centrally and to the right. Hypertrophy of the ligamentum flavum and facet joints and congenitally short pedicles combine to create severe spinal stenosis. The nerve roots are trapped at this level with some redundancy above the stenosis. Moderate bilateral foraminal stenosis. Right far lateral disc protrusion. Degenerative changes of the vertebral endplates asymmetric to the right with some edema in the endplates. L2-3: Marked disc space narrowing. Slight retrolisthesis. Broad-based endplate osteophyte touches the ventral aspect of the thecal sac but there is no disc protrusion or focal neural impingement. No foraminal stenosis. L3-4: Marked disc space narrowing. Slight retrolisthesis. Broad-based endplate osteophytes extend into both neural foramina. Hypertrophy of the ligamentum flavum and facet joints combine to create mild spinal stenosis and severe left foraminal stenosis. L4-5: Marked disc space narrowing. There is a small disc extrusion to the right of midline extending superiorly behind the body of L4 and into the left neural foramen. Combined with hypertrophy of the ligamentum flavum and facet joints this creates severe left lateral recess and foraminal stenosis which should affect the left L4 nerve. Hypertrophy of the facet joints also creates mild spinal stenosis and left lateral recess narrowing which could affect the left L5 nerve. L5-S1: Tiny disc bulge into the right neural foramen with no neural impingement. Moderate bilateral facet arthritis with hypertrophy of the ligamentum flavum with slight compression of the posterolateral  aspects of the thecal sac without focal neural impingement. IMPRESSION: 1. Severe spinal stenosis at L1-2 due to a combination of a broad-based soft disc protrusion and hypertrophy of the ligamentum flavum and facet joints. 2. Severe left foraminal stenosis at L3-4. 3. Soft disc extrusion and protrusion compresses the left L4 nerve in the lateral recess and neural foramen at L4-5. Electronically Signed   By: Lorriane Shire M.D.   On: 05/18/2019 14:22    Assessment/Plan: Left leg pain, lumbar stenosis, lumbago: There is nothing acute or urgent on his thoracic and lumbar MRI scans.  He does not need urgent spine surgery.  I would recommend that he continue to be mobilized with PT and symptomatic medical management.  He can follow-up with Dr. Sherwood Gambler in the office after discharge.  LOS: 4 days     Ophelia Charter 05/19/2019, 10:32 AM

## 2019-05-19 NOTE — Plan of Care (Signed)
  Problem: Education: Goal: Knowledge of General Education information will improve Description: Including pain rating scale, medication(s)/side effects and non-pharmacologic comfort measures Outcome: Progressing   Problem: Education: Goal: Understanding of medication regimen will improve Outcome: Progressing   Problem: Cardiac: Goal: Ability to achieve and maintain adequate cardiopulmonary perfusion will improve Outcome: Progressing   Problem: Clinical Measurements: Goal: Ability to maintain clinical measurements within normal limits will improve Outcome: Progressing Goal: Will remain free from infection Outcome: Progressing Goal: Diagnostic test results will improve Outcome: Progressing Goal: Respiratory complications will improve Outcome: Progressing Goal: Cardiovascular complication will be avoided Outcome: Progressing   Problem: Activity: Goal: Risk for activity intolerance will decrease Outcome: Progressing   Problem: Nutrition: Goal: Adequate nutrition will be maintained Outcome: Progressing   Problem: Elimination: Goal: Will not experience complications related to bowel motility Outcome: Progressing Goal: Will not experience complications related to urinary retention Outcome: Progressing   Problem: Pain Managment: Goal: General experience of comfort will improve Outcome: Progressing   Problem: Safety: Goal: Ability to remain free from injury will improve Outcome: Progressing   Problem: Skin Integrity: Goal: Risk for impaired skin integrity will decrease Outcome: Progressing

## 2019-05-19 NOTE — Discharge Summary (Addendum)
. Physician Discharge Summary  Adam Richards [id number] DOB: Aug 10, 1926 DOA: 05/14/2019  PCP: Wenda Low, MD  Admit date: 05/14/2019 Discharge date: 05/19/2019  Admitted From: Home Disposition:  Discharged to home [place]  Recommendations for Outpatient Follow-up:  1. Follow up with PCP in 1-2 weeks 2. Please obtain BMP/CBC in one week  Discharge Condition: Stable  CODE STATUS: FULL   Brief/Interim Summary: The patient is a 85 yr old man who presented from home with complaints of severe back pain. The patient carries a past medical history significant for vertebral compression fracture, atrial fibrillation on coumadin, and hypertension. He states that the pain began a couple of days ago after he did some "back exercises which included arching his back. There is no saddle anesthesia or lower extremity weakness. He has no incontinence of bowel or bladder. He also had some complaints of palpitations, although no chest pain. He has had some shortness of breath with exertion lately and some increased lower extremity edema. CT angio of the chest and abdomen was performed to rule out dissection which was negative. 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 There was mild cardiomegaly with small pleural effusions. Tree in bud opacities in both lung bases were non-specific but could represent chronic lung changes. There were age-indeterminate compression deformities of T12 and L2 with 25 - 30% loss in vertebral body heiight. There is no retropulsion of fragments or severe canal stenosis.  05/19/19: Neurosurgery reviewed MRI. Appreciate assistance. No urgent surgical need. Can f/u with neurosurgery outpt. Will get HHPT.   Discharge Diagnoses:  Active Problems:   Atrial fibrillation, chronic (HCC)   Back pain   Subtherapeutic international normalized ratio (INR)   Hyponatremia   Lumbar pain  Lumbar back  pain Debility - Pt has a known history of compression fracture.  - Age-indeterminate T12 and L2 compression deformity is seen on CTA chest and abdomen. - Pt is receiving pain control and muscle relaxants as well as a K-pad. - PT/OT onboard: rec HHPT/OT - 05/17/19: Denies neurologic changes/loss of sensation in the groin or lower ext; he notes pain moving down left leg with movement - have d/w ortho bracing; they are deferring to neurosurgery; have paged, awaiting discussion (Re: bracing/PT vs kyphoplasty) - talked with neurosurgery; MR t & l spine ordered; appreciate assistance.     - 05/18/19: MR still pending. Looks good today. TED hose ordered     - 05/19/19: MR T/L spine:         IMPRESSION:         1. 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         2. Moderate right foraminal stenosis at T12-L1.         3. Old healed compression fracture of T12.         IMPRESSION:         1. Severe spinal stenosis at L1-2 due to a combination of a broad-based soft disc protrusion and hypertrophy of the ligamentum flavum and facet joints.         2. Severe left foraminal stenosis at L3-4.         3. Soft disc extrusion and protrusion compresses the left L4 nerve in the lateral recess and neural foramen at L4-5.  Atrial fibrillation with RVR - atrial fibrillation with controlled rate on metoprolol; digoxin was held - Coumadin for stroke prophylaxis as managed by  pharmacy.  Subtherapeutic INR - at admission INR was 1.9. - pharm dosing; INR 2.9 today, monitor - INR is 3.1 this AM; monitor, dosing per pharm     - 05/19/19: INR is 2.4; will d/c on coumadin 2mg  daily, follow up with coumadin clinic, PCP for further testing, will get 1mg  dose prior to discharge.   Hyponatremia - Na+ 127 at admission, it's 126 today - urine studies suggest some hypovolemia; will add 1L NS, and repeat studies -  asymptomatic, monitor  Normocytic anemia -f/uiron studies - no evienceof bleed; Hgb is stable  Discharge Instructions   Allergies as of 05/19/2019   No Known Allergies     Medication List    STOP taking these medications   Digox 0.125 MG tablet Generic drug: digoxin   triamterene-hydrochlorothiazide 37.5-25 MG tablet Commonly known as: MAXZIDE-25     TAKE these medications   amLODipine 5 MG tablet Commonly known as: NORVASC Take 1 tablet by mouth daily.   docusate calcium 240 MG capsule Commonly known as: SURFAK Take 240 mg by mouth daily.   dutasteride 0.5 MG capsule Commonly known as: AVODART Take 1 capsule by mouth daily.   HYDROcodone-acetaminophen 5-325 MG tablet Commonly known as: NORCO/VICODIN Take 1 tablet by mouth every 6 (six) hours as needed for up to 3 days for moderate pain.   lovastatin 20 MG tablet Commonly known as: MEVACOR Take 20 mg by mouth daily.   methocarbamol 500 MG tablet Commonly known as: ROBAXIN Take 1 tablet (500 mg total) by mouth 4 (four) times daily for 7 days.   metoprolol tartrate 25 MG tablet Commonly known as: LOPRESSOR Take 1 tablet by mouth 2 (two) times daily.   tamsulosin 0.4 MG Caps capsule Commonly known as: FLOMAX Take 1 capsule by mouth daily.   warfarin 2 MG tablet Commonly known as: COUMADIN Take 1 tablet (2 mg total) by mouth one time only at 6 PM. Start taking on: May 20, 2019 What changed:   medication strength  how much to take  when to take this            Durable Medical Equipment  (From admission, onward)         Start     Ordered   05/17/19 0826  For home use only DME 3 n 1  Once     05/17/19 0825         Follow-up Information    [facility] Follow up.   Why: 3n1 arranged- to be delivered to room prior to discharge         No Known Allergies  Consultations:  Neurosurgery   Procedures/Studies: MR THORACIC SPINE WO CONTRAST  Result Date:  05/18/2019 CLINICAL DATA:  Mid and lower back pain radiating to the left hip and left anterior thigh. EXAM: MRI THORACIC SPINE WITHOUT CONTRAST TECHNIQUE: Multiplanar, multisequence MR imaging of the thoracic spine was performed. No intravenous contrast was administered. COMPARISON:  CT angiogram of the chest dated 05/14/2019 FINDINGS: Alignment:  Physiologic. Vertebrae: No acute fractures or bone destruction or discitis. Old mild anterior wedge deformity of T12. Prominent Schmorl's node in the superior endplate of L2, old. Cord:  Normal signal and morphology. Paraspinal and other soft tissues: Negative. Disc levels: T1-2 through T5-6: Disc space narrowing. No disc bulging protrusion. No foraminal or spinal stenosis. T6-7: Disc space narrowing. Tiny central disc protrusion with no neural impingement. T7-8 through T8-9: Disc space narrowing. Otherwise negative. T9-10: Small broad-based disc bulge with accompanying osteophytes without  neural impingement. T10-11: Disc space narrowing. No disc bulging or protrusion. T11-12: Small disc protrusion just to the right of midline without neural impingement. Hypertrophy of the right facet joint compresses the right posterolateral aspect of the thecal sac but does not compress the spinal cord. No foraminal stenosis. Old anterior wedge deformity of T12, probably old healed compression fracture. T12-L1: Broad-based disc bulge with accompanying osteophytes asymmetric to the right without neural impingement. Moderate right foraminal stenosis. L1-2: See lumbar MRI report of 05/18/2019. IMPRESSION: 1. 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 2. Moderate right foraminal stenosis at T12-L1. 3. Old healed compression fracture of T12. Electronically Signed   By: Lorriane Shire M.D.   On: 05/18/2019 13:54   MR LUMBAR SPINE WO CONTRAST  Result Date: 05/18/2019 CLINICAL DATA:  Persistent mid and low back pain extending into the left hip  and anterior left thigh. EXAM: MRI LUMBAR SPINE WITHOUT CONTRAST TECHNIQUE: Multiplanar, multisequence MR imaging of the lumbar spine was performed. No intravenous contrast was administered. COMPARISON:  None. FINDINGS: Segmentation:  Standard. Alignment:  3 mm retrolisthesis of L2 on L3 and of L3 on L4. Vertebrae: Old healed compression fracture of the superior endplate of 624THL. Old benign Schmorl's node in the superior endplate of L2. No acute fractures or bone destruction. Conus medullaris and cauda equina: Conus extends to the L1 level. Conus appears normal. Paraspinal and other soft tissues: Negative. Disc levels: L1-2: Broad-based disc protrusion most prominent centrally and to the right. Hypertrophy of the ligamentum flavum and facet joints and congenitally short pedicles combine to create severe spinal stenosis. The nerve roots are trapped at this level with some redundancy above the stenosis. Moderate bilateral foraminal stenosis. Right far lateral disc protrusion. Degenerative changes of the vertebral endplates asymmetric to the right with some edema in the endplates. L2-3: Marked disc space narrowing. Slight retrolisthesis. Broad-based endplate osteophyte touches the ventral aspect of the thecal sac but there is no disc protrusion or focal neural impingement. No foraminal stenosis. L3-4: Marked disc space narrowing. Slight retrolisthesis. Broad-based endplate osteophytes extend into both neural foramina. Hypertrophy of the ligamentum flavum and facet joints combine to create mild spinal stenosis and severe left foraminal stenosis. L4-5: Marked disc space narrowing. There is a small disc extrusion to the right of midline extending superiorly behind the body of L4 and into the left neural foramen. Combined with hypertrophy of the ligamentum flavum and facet joints this creates severe left lateral recess and foraminal stenosis which should affect the left L4 nerve. Hypertrophy of the facet joints also creates  mild spinal stenosis and left lateral recess narrowing which could affect the left L5 nerve. L5-S1: Tiny disc bulge into the right neural foramen with no neural impingement. Moderate bilateral facet arthritis with hypertrophy of the ligamentum flavum with slight compression of the posterolateral aspects of the thecal sac without focal neural impingement. IMPRESSION: 1. Severe spinal stenosis at L1-2 due to a combination of a broad-based soft disc protrusion and hypertrophy of the ligamentum flavum and facet joints. 2. Severe left foraminal stenosis at L3-4. 3. Soft disc extrusion and protrusion compresses the left L4 nerve in the lateral recess and neural foramen at L4-5. Electronically Signed   By: Lorriane Shire M.D.   On: 05/18/2019 14:22   XR Chest Single View  Result Date: 05/14/2019 CLINICAL DATA:  85 year old male with tachycardia. EXAM: PORTABLE CHEST 1 VIEW COMPARISON:  Chest radiograph dated 05/20/2018. FINDINGS: Minimal left lung base atelectasis. No focal  consolidation, pleural effusion, or pneumothorax. Stable mild cardiomegaly. Atherosclerotic calcification of the aorta. No acute osseous pathology. IMPRESSION: 1. No active disease. 2. Stable mild cardiomegaly. Electronically Signed   By: Anner Crete M.D.   On: 05/14/2019 18:52   CT Angio Chest/Abd/Pel for Dissection W and/or W/WO  Result Date: 05/14/2019 CLINICAL DATA:  Confusion lower back pain and abdominal pain EXAM: CT ANGIOGRAPHY CHEST, ABDOMEN AND PELVIS TECHNIQUE: Multidetector CT imaging through the chest, abdomen and pelvis was performed using the standard protocol during bolus administration of intravenous contrast. Multiplanar reconstructed images and MIPs were obtained and reviewed to evaluate the vascular anatomy. CONTRAST:  136mL OMNIPAQUE IOHEXOL 350 MG/ML SOLN COMPARISON:  None. FINDINGS: CTA CHEST FINDINGS Cardiovascular: --Heart: The heart size is mildly enlarged. There is smallpericardial effusion. --Aorta: The course  and caliber of the thoracic aorta are normal. There is scattered moderate aortic atherosclerotic calcification. Precontrast images show no aortic intramural hematoma. There is no blood pool, dissection or penetrating ulcer demonstrated on arterial phase postcontrast imaging. There is a conventional 3 vessel aortic arch branching pattern. The proximal arch vessels are widely patent. Coronary artery calcifications are seen. --Pulmonary Arteries: Contrast timing is optimized for preferential opacification of the aorta. Within that limitation, normal central pulmonary arteries. Mediastinum/Nodes: No mediastinal, hilar or axillary lymphadenopathy. The visualized thyroid and thoracic esophageal course are unremarkable. Lungs/Pleura: Mildly increased interstitial markings and tree-in-bud opacities seen at both lung bases, left greater than right. There is also a small amount of streaky opacity at both lung bases. No large airspace consolidation or pleural effusion. Musculoskeletal: No chest wall abnormality. No acute osseous findings. There is diffuse osteopenia. Degenerative changes seen in the thoracic spine. Review of the MIP images confirms the above findings. CTA ABDOMEN AND PELVIS FINDINGS VASCULAR Aorta: Normal caliber aorta without aneurysm, dissection, vasculitis or hemodynamically significant stenosis. There is scattered moderate aortic atherosclerosis. Celiac: No aneurysm, dissection. There is mild stenosis seen at the origin of the celiac axis due to atherosclerosis. Normal branching pattern SMA: There is mild stenosis seen at the origin of the SMA due to atherosclerosis. Renals: Single renal arteries bilaterally. There is moderate stenosis seen at the proximal left renal artery and origin of the right renal artery for due to atherosclerosis. IMA: There is focal stenosis seen at the IMA due to atherosclerosis, however it is patent. Inflow: No aneurysm, stenosis or dissection. Veins: Normal course and caliber of  the major veins. Assessment is otherwise limited by the arterial dominant contrast phase. Review of the MIP images confirms the above findings. NON-VASCULAR Hepatobiliary: Normal hepatic contours and density. No visible biliary dilatation. Normal gallbladder. Pancreas: Normal contours without ductal dilatation. No peripancreatic fluid collection. Spleen: Normal arterial phase splenic enhancement pattern. Adrenals/Urinary Tract: --Adrenal glands: Normal. --Right kidney/ureter: No hydronephrosis or perinephric stranding. No nephrolithiasis. No obstructing ureteral stones. --Left kidney/ureter: There is a large multilocular septated lesion seen within the upper pole of the left kidney measuring 6.9 cm. There is calcifications seen within the internal septae a. No hydronephrosis or shadowing stones. Of --Urinary bladder: Unremarkable. Stomach/Bowel: --Stomach/Duodenum: No hiatal hernia or other gastric abnormality. Normal duodenal course and caliber. --Small bowel: No dilatation or inflammation. --Colon: Scattered colonic diverticula are noted without diverticulitis. --Appendix: Normal. Lymphatic:  No abdominal or pelvic lymphadenopathy. Reproductive: No free fluid in the pelvis. Musculoskeletal. There is diffuse osteopenia. There is age indeterminate superior compression deformity of the T12 vertebral body with approximately 30% loss in superior vertebral body height. There is also a slight superior  compression deformity of the L2 vertebral body with 25% loss of vertebral body height. No retropulsion of fragments is seen. There is a mild levoconvex scoliotic curvature of the lumbar spine. Other: None. Review of the MIP images confirms the above findings. IMPRESSION: 1. No acute intrathoracic or abdominal aortic abnormality. 2.  Aortic Atherosclerosis (ICD10-I70.0). 3. mild-to-moderate stenosis due to atherosclerosis at the origin of the SMA, celiac, IMA and bilateral renal arteries. 4. Mild cardiomegaly and small  pericardial effusion 5. Mildly increased interstitial lung markings and tree-in-bud opacities at both lung bases. This is non-specific could be due to chronic lung changes and/or viral infectious etiology. 6. Age-indeterminate compression deformities of T12 and L2 with 25-30% loss in vertebral body height. No retropulsion of fragments or severe canal stenosis. 7. Diverticulosis without diverticulitis. 8. Multilocular cystic lesion measuring 6.9 cm, likely minimally complicated renal cyst. No further evaluation is recommended. This recommendation follows ACR consensus guidelines: Management of the Incidental Renal Mass on CT: A White Paper of the ACR Incidental Findings Committee. J Am Coll Radiol [phone]. Electronically Signed   By: Prudencio Pair M.D.   On: 05/14/2019 20:49      Subjective: "It finally got done."  Discharge Exam: Vitals:   05/19/19 0432 05/19/19 0959  BP: 119/80 (!) 145/88  Pulse: (!) 54 75  Resp: 14 18  Temp: (!) 97.5 F (36.4 C) (!) 97.4 F (36.3 C)  SpO2: 95% 96%   Vitals:   05/18/19 2047 05/18/19 2106 05/19/19 0432 05/19/19 0959  BP:  (!) 141/95 119/80 (!) 145/88  Pulse: (!) 110 75 (!) 54 75  Resp:  16 14 18   Temp:  98.4 F (36.9 C) (!) 97.5 F (36.4 C) (!) 97.4 F (36.3 C)  TempSrc:  Oral Oral Axillary  SpO2:  98% 95% 96%  Weight:      Height:        General: 85 y.o. male resting in bed in NAD Eyes: PERRL, normal sclera Cardiovascular: RRR, +S1, S2, no m/g/r Respiratory: CTABL, no w/r/r GI: BS+, NDNT, no masses noted, no organomegaly noted MSK: No e/c/c Neuro: A&O x 3, no focal deficits Psyc: Appropriate interaction and affect, calm/cooperative   The results of significant diagnostics from this hospitalization (including imaging, microbiology, ancillary and laboratory) are listed below for reference.     Microbiology: Recent Results (from the past 240 hour(s))  SARS CORONAVIRUS 2 (TAT 6-24 HRS) Nasopharyngeal Nasopharyngeal Swab      Status: None   Collection Time: 05/15/19  4:10 AM   Specimen: Nasopharyngeal Swab  Result Value Ref Range Status   SARS Coronavirus 2 NEGATIVE NEGATIVE Final    Comment: (NOTE) SARS-CoV-2 target nucleic acids are NOT DETECTED. The SARS-CoV-2 RNA is generally detectable in upper and lower respiratory specimens during the acute phase of infection. Negative results do not preclude SARS-CoV-2 infection, do not rule out co-infections with other pathogens, and should not be used as the sole basis for treatment or other patient management decisions. Negative results must be combined with clinical observations, patient history, and epidemiological information. The expected result is Negative. Fact Sheet for Patients: SugarRoll.be Fact Sheet for Healthcare Providers: https://www.woods-mathews.com/ This test is not yet approved or cleared by the [place] FDA and  has been authorized for detection and/or diagnosis of SARS-CoV-2 by FDA under an Emergency Use Authorization (EUA). This EUA will remain  in effect (meaning this test can be used) for the duration of the COVID-19 declaration under Section 56 4(b)(1) of the Act, 21 U.S.C.  section 360bbb-3(b)(1), unless the authorization is terminated or revoked sooner. Performed at [facility], [place] [address] [place], [place] [address]      Labs: BNP (last 3 results) No results for input(s): BNP in the last 8760 hours. Basic Metabolic Panel: Recent Labs  Lab 05/15/19 1757 05/16/19 0321 05/17/19 0814 05/18/19 0844 05/19/19 0333  NA 123* 126* 129* 129* 131*  K 4.7 4.2 4.4 4.7 4.2  CL 90* 93* 94* 95* 97*  CO2 23 25 27 25 27   GLUCOSE 111* 88 90 89 85  BUN 16 17 15 14 16   CREATININE 0.92 0.76 0.95 0.85 0.84  CALCIUM 8.9 8.8* 8.8* 8.9 8.7*  MG  --   --  1.9 1.9 1.9  PHOS  --   --  3.7 3.4 3.7   Liver Function Tests: Recent Labs  Lab 05/14/19 1822 05/17/19 0814 05/18/19 0844  05/19/19 0333  AST 28  --   --   --   ALT 20  --   --   --   ALKPHOS 93  --   --   --   BILITOT 1.0  --   --   --   PROT 6.7  --   --   --   ALBUMIN 4.1 3.4* 3.4* 3.1*   Recent Labs  Lab 05/14/19 1822  LIPASE 24   No results for input(s): AMMONIA in the last 168 hours. CBC: Recent Labs  Lab 05/15/19 0308 05/16/19 0321 05/17/19 0814 05/18/19 0844 05/19/19 0333  WBC 7.1 5.4 5.2 5.5 5.1  NEUTROABS  --  3.0 3.0 3.1 2.4  HGB 11.0* 10.9* 10.9* 11.0* 10.8*  HCT 32.4* 31.4* 32.0* 32.4* 31.9*  MCV 98.2 97.2 99.4 98.8 98.5  PLT 157 150 146* 157 146*   Cardiac Enzymes: No results for input(s): CKTOTAL, CKMB, CKMBINDEX, TROPONINI in the last 168 hours. BNP: Invalid input(s): POCBNP CBG: No results for input(s): GLUCAP in the last 168 hours. D-Dimer No results for input(s): DDIMER in the last 72 hours. Hgb A1c No results for input(s): HGBA1C in the last 72 hours. Lipid Profile No results for input(s): CHOL, HDL, LDLCALC, TRIG, CHOLHDL, LDLDIRECT in the last 72 hours. Thyroid function studies No results for input(s): TSH, T4TOTAL, T3FREE, THYROIDAB in the last 72 hours.  Invalid input(s): FREET3 Anemia work up Recent Labs    05/17/19 1524  FERRITIN 169  TIBC 367  IRON 39*   Urinalysis    Component Value Date/Time   COLORURINE YELLOW 05/14/2019 1844   APPEARANCEUR CLEAR 05/14/2019 1844   LABSPEC 1.011 05/14/2019 1844   PHURINE 7.0 05/14/2019 1844   GLUCOSEU NEGATIVE 05/14/2019 1844   HGBUR NEGATIVE 05/14/2019 1844   BILIRUBINUR NEGATIVE 05/14/2019 1844   KETONESUR 5 (A) 05/14/2019 1844   PROTEINUR NEGATIVE 05/14/2019 1844   NITRITE NEGATIVE 05/14/2019 1844   LEUKOCYTESUR NEGATIVE 05/14/2019 1844   Sepsis Labs Invalid input(s): PROCALCITONIN,  WBC,  LACTICIDVEN Microbiology Recent Results (from the past 240 hour(s))  SARS CORONAVIRUS 2 (TAT 6-24 HRS) Nasopharyngeal Nasopharyngeal Swab     Status: None   Collection Time: 05/15/19  4:10 AM   Specimen:  Nasopharyngeal Swab  Result Value Ref Range Status   SARS Coronavirus 2 NEGATIVE NEGATIVE Final    Comment: (NOTE) SARS-CoV-2 target nucleic acids are NOT DETECTED. The SARS-CoV-2 RNA is generally detectable in upper and lower respiratory specimens during the acute phase of infection. Negative results do not preclude SARS-CoV-2 infection, do not rule out co-infections with other pathogens, and  should not be used as the sole basis for treatment or other patient management decisions. Negative results must be combined with clinical observations, patient history, and epidemiological information. The expected result is Negative. Fact Sheet for Patients: SugarRoll.be Fact Sheet for Healthcare Providers: https://www.woods-mathews.com/ This test is not yet approved or cleared by the [place] FDA and  has been authorized for detection and/or diagnosis of SARS-CoV-2 by FDA under an Emergency Use Authorization (EUA). This EUA will remain  in effect (meaning this test can be used) for the duration of the COVID-19 declaration under Section 56 4(b)(1) of the Act, 21 U.S.C. section 360bbb-3(b)(1), unless the authorization is terminated or revoked sooner. Performed at [facility], [place] [address], [place], [place] [address]      Time coordinating discharge: 35 minutes  SIGNED:   Jonnie Finner, DO  [facility] 05/19/2019, 10:48 AM   If 7PM-7AM, please contact night-coverage [email or web address]

## 2019-05-19 NOTE — Progress Notes (Signed)
pr

## 2019-05-19 NOTE — Progress Notes (Signed)
Discharge instructions given to Adam Richards and his daughter [name].  Discussed signs and symptoms to watch for and when to contact the physician.  Discussed new medications, medication changes and side effects.  Discussed follow up appointments.  Verbalized understanding.

## 2019-05-19 NOTE — TOC Transition Note (Signed)
Transition of Care [facility]) - CM/SW Discharge Note   Patient Details  Name: Tal Mccreedy MRN: [id number] Date of Birth: [birth year]  Transition of Care [facility]) CM/SW Contact:  Claudie Leach, RN 05/19/2019, 1:22 PM   Clinical Narrative:    Discussed d/c plan with patient.  Patient to go home initially with daughter for 24-hour supervision.  Daughter's address is [address] [place], [place].  When patient is able, he will return to [place] apartment, [place].  This address is [address] [place], [place] [address].  Patient does not have a preference of [place] agency.  Alvis Lemmings is able to accept referral.    Patient received 3n1 from [employer] to take home.     Final next level of care: [place] Barriers to Discharge: No Barriers Identified   Patient Goals and CMS Choice Patient states their goals for this hospitalization and ongoing recovery are:: to get back to independent living apartment CMS Medicare.gov Compare Post Acute Care list provided to:: Patient Choice offered to / list presented to : Patient   Discharge Plan and Services                DME Arranged: 3-N-1 DME Agency: [facility] Date DME Agency Contacted: 05/17/19     HH Arranged: PT, OT [place] Agency: [place] Date [facility] Agency Contacted: 05/19/19 Time [place]: [id number] Representative spoke with at [place]: Tommi Rumps

## 2019-05-21 DIAGNOSIS — J9811 Atelectasis: Secondary | ICD-10-CM | POA: Diagnosis not present

## 2019-05-21 DIAGNOSIS — E871 Hypo-osmolality and hyponatremia: Secondary | ICD-10-CM | POA: Diagnosis not present

## 2019-05-21 DIAGNOSIS — M47817 Spondylosis without myelopathy or radiculopathy, lumbosacral region: Secondary | ICD-10-CM | POA: Diagnosis not present

## 2019-05-21 DIAGNOSIS — M5124 Other intervertebral disc displacement, thoracic region: Secondary | ICD-10-CM | POA: Diagnosis not present

## 2019-05-21 DIAGNOSIS — M8580 Other specified disorders of bone density and structure, unspecified site: Secondary | ICD-10-CM | POA: Diagnosis not present

## 2019-05-21 DIAGNOSIS — M4316 Spondylolisthesis, lumbar region: Secondary | ICD-10-CM | POA: Diagnosis not present

## 2019-05-21 DIAGNOSIS — M4804 Spinal stenosis, thoracic region: Secondary | ICD-10-CM | POA: Diagnosis not present

## 2019-05-21 DIAGNOSIS — M5134 Other intervertebral disc degeneration, thoracic region: Secondary | ICD-10-CM | POA: Diagnosis not present

## 2019-05-21 DIAGNOSIS — I700 Atherosclerosis of aorta: Secondary | ICD-10-CM | POA: Diagnosis not present

## 2019-05-21 DIAGNOSIS — M48061 Spinal stenosis, lumbar region without neurogenic claudication: Secondary | ICD-10-CM | POA: Diagnosis not present

## 2019-05-21 DIAGNOSIS — K5730 Diverticulosis of large intestine without perforation or abscess without bleeding: Secondary | ICD-10-CM | POA: Diagnosis not present

## 2019-05-21 DIAGNOSIS — I701 Atherosclerosis of renal artery: Secondary | ICD-10-CM | POA: Diagnosis not present

## 2019-05-21 DIAGNOSIS — M4805 Spinal stenosis, thoracolumbar region: Secondary | ICD-10-CM | POA: Diagnosis not present

## 2019-05-21 DIAGNOSIS — J90 Pleural effusion, not elsewhere classified: Secondary | ICD-10-CM | POA: Diagnosis not present

## 2019-05-21 DIAGNOSIS — I119 Hypertensive heart disease without heart failure: Secondary | ICD-10-CM | POA: Diagnosis not present

## 2019-05-21 DIAGNOSIS — F17210 Nicotine dependence, cigarettes, uncomplicated: Secondary | ICD-10-CM | POA: Diagnosis not present

## 2019-05-21 DIAGNOSIS — D649 Anemia, unspecified: Secondary | ICD-10-CM | POA: Diagnosis not present

## 2019-05-21 DIAGNOSIS — M4186 Other forms of scoliosis, lumbar region: Secondary | ICD-10-CM | POA: Diagnosis not present

## 2019-05-21 DIAGNOSIS — M5126 Other intervertebral disc displacement, lumbar region: Secondary | ICD-10-CM | POA: Diagnosis not present

## 2019-05-21 DIAGNOSIS — M2578 Osteophyte, vertebrae: Secondary | ICD-10-CM | POA: Diagnosis not present

## 2019-05-21 DIAGNOSIS — M4856XD Collapsed vertebra, not elsewhere classified, lumbar region, subsequent encounter for fracture with routine healing: Secondary | ICD-10-CM | POA: Diagnosis not present

## 2019-05-21 DIAGNOSIS — M4854XD Collapsed vertebra, not elsewhere classified, thoracic region, subsequent encounter for fracture with routine healing: Secondary | ICD-10-CM | POA: Diagnosis not present

## 2019-05-21 DIAGNOSIS — M47814 Spondylosis without myelopathy or radiculopathy, thoracic region: Secondary | ICD-10-CM | POA: Diagnosis not present

## 2019-05-21 DIAGNOSIS — I482 Chronic atrial fibrillation, unspecified: Secondary | ICD-10-CM | POA: Diagnosis not present

## 2019-05-21 DIAGNOSIS — M5125 Other intervertebral disc displacement, thoracolumbar region: Secondary | ICD-10-CM | POA: Diagnosis not present

## 2019-05-28 ENCOUNTER — Other Ambulatory Visit: Payer: Self-pay | Admitting: *Deleted

## 2019-05-28 DIAGNOSIS — M5134 Other intervertebral disc degeneration, thoracic region: Secondary | ICD-10-CM | POA: Diagnosis not present

## 2019-05-28 DIAGNOSIS — M4804 Spinal stenosis, thoracic region: Secondary | ICD-10-CM | POA: Diagnosis not present

## 2019-05-28 DIAGNOSIS — M4856XD Collapsed vertebra, not elsewhere classified, lumbar region, subsequent encounter for fracture with routine healing: Secondary | ICD-10-CM | POA: Diagnosis not present

## 2019-05-28 DIAGNOSIS — M4805 Spinal stenosis, thoracolumbar region: Secondary | ICD-10-CM | POA: Diagnosis not present

## 2019-05-28 DIAGNOSIS — M48061 Spinal stenosis, lumbar region without neurogenic claudication: Secondary | ICD-10-CM | POA: Diagnosis not present

## 2019-05-28 DIAGNOSIS — M4854XD Collapsed vertebra, not elsewhere classified, thoracic region, subsequent encounter for fracture with routine healing: Secondary | ICD-10-CM | POA: Diagnosis not present

## 2019-05-28 NOTE — Patient Outreach (Signed)
[facility]) Care Management  05/28/2019  Gwyn Lehne 06-03-1926 [id number]   RED ON EMMI ALERT Day #  Date:  Red Alert Reason: No follow up appointment   Outreach attempt #1, initially unsuccessful however member immediately returned call.  Referral received from care management assistant for above noted reason.  Per chart, he has history of chronic A-fib and chronic back pain.  Call placed to member, identity verified.  This care manager introduced self and stated purpose of call.  [facility] care management services explained.  He state he is currently living with his daughter as he has not felt strong enough to be able to independently care for himself at home alone.  He is still having some pain but relieved with the prescriptions meds.  He does report having follow up appointment with cardiology on 1/14 and with PCP on 1/6 however has not been able to schedule appointment with neurosurgery.  State he called and left a voice message requesting call back.  This care manager made conference call to neurosurgeon office during outreach, unable to schedule appointment but voice message left for office to return call to either this care manager or directly to member.  He report his daughter should be able to provide transportation to PCP appointment this week but will check to make sure.  If she is unable to he will contact this care manager for resources.  Denies any other concerns at this time, report all other medical conditions (A-fib) being stable.   Plan: RN CM will follow up with member within the next 2 weeks.  If he has appointment with neurosurgery and denies further needs, will close case.  [name], [place], MSN [place] [phone]

## 2019-05-30 DIAGNOSIS — M4854XD Collapsed vertebra, not elsewhere classified, thoracic region, subsequent encounter for fracture with routine healing: Secondary | ICD-10-CM | POA: Diagnosis not present

## 2019-05-30 DIAGNOSIS — M48061 Spinal stenosis, lumbar region without neurogenic claudication: Secondary | ICD-10-CM | POA: Diagnosis not present

## 2019-05-30 DIAGNOSIS — M4804 Spinal stenosis, thoracic region: Secondary | ICD-10-CM | POA: Diagnosis not present

## 2019-05-30 DIAGNOSIS — M4856XD Collapsed vertebra, not elsewhere classified, lumbar region, subsequent encounter for fracture with routine healing: Secondary | ICD-10-CM | POA: Diagnosis not present

## 2019-05-30 DIAGNOSIS — M4805 Spinal stenosis, thoracolumbar region: Secondary | ICD-10-CM | POA: Diagnosis not present

## 2019-05-30 DIAGNOSIS — M5134 Other intervertebral disc degeneration, thoracic region: Secondary | ICD-10-CM | POA: Diagnosis not present

## 2019-05-31 DIAGNOSIS — Z7901 Long term (current) use of anticoagulants: Secondary | ICD-10-CM | POA: Diagnosis not present

## 2019-05-31 DIAGNOSIS — I4891 Unspecified atrial fibrillation: Secondary | ICD-10-CM | POA: Diagnosis not present

## 2019-05-31 DIAGNOSIS — S32000A Wedge compression fracture of unspecified lumbar vertebra, initial encounter for closed fracture: Secondary | ICD-10-CM | POA: Diagnosis not present

## 2019-05-31 DIAGNOSIS — E871 Hypo-osmolality and hyponatremia: Secondary | ICD-10-CM | POA: Diagnosis not present

## 2019-05-31 DIAGNOSIS — I10 Essential (primary) hypertension: Secondary | ICD-10-CM | POA: Diagnosis not present

## 2019-06-04 DIAGNOSIS — M4854XD Collapsed vertebra, not elsewhere classified, thoracic region, subsequent encounter for fracture with routine healing: Secondary | ICD-10-CM | POA: Diagnosis not present

## 2019-06-04 DIAGNOSIS — M4805 Spinal stenosis, thoracolumbar region: Secondary | ICD-10-CM | POA: Diagnosis not present

## 2019-06-04 DIAGNOSIS — M4804 Spinal stenosis, thoracic region: Secondary | ICD-10-CM | POA: Diagnosis not present

## 2019-06-04 DIAGNOSIS — M48061 Spinal stenosis, lumbar region without neurogenic claudication: Secondary | ICD-10-CM | POA: Diagnosis not present

## 2019-06-04 DIAGNOSIS — M4856XD Collapsed vertebra, not elsewhere classified, lumbar region, subsequent encounter for fracture with routine healing: Secondary | ICD-10-CM | POA: Diagnosis not present

## 2019-06-04 DIAGNOSIS — M5134 Other intervertebral disc degeneration, thoracic region: Secondary | ICD-10-CM | POA: Diagnosis not present

## 2019-06-06 DIAGNOSIS — Z7189 Other specified counseling: Secondary | ICD-10-CM | POA: Insufficient documentation

## 2019-06-06 DIAGNOSIS — I10 Essential (primary) hypertension: Secondary | ICD-10-CM | POA: Insufficient documentation

## 2019-06-06 NOTE — Progress Notes (Signed)
Cardiology Office Note   Date:  06/07/2019   ID:  Adam Richards, DOB 06/08/1926, MRN [id number]  PCP:  Adam Low, MD  Cardiologist:   Minus Breeding, MD   No chief complaint on file.    History of Present Illness: Adam Richards is a 86 y.o. male who presents for evaluation of atrial fibrillation. He moved here from [place]. He has a long-standing history of permanent atrial fibrillation. He's been on anticoagulation for years. Since I last saw him he was in the hospital with compression fractures of his spine.  He had a lot of pain and is staying with his daughter since he got out of the hospital.  He is walking with a walker.  I do notice in the hospital that his sodium was Richards.  I reviewed the records.  They stopped his triamterene hydrochlorothiazide.  They stop digoxin as well.  However, he did not need cardiac consultation while he was there.  He had no procedures.  He does not notice his atrial fibrillation.  He does not feel any rapid heart rhythms.  He said no presyncope or syncope.  He denies any chest pressure, neck or arm discomfort.  He has had no weight gain or edema.  He has had no presyncope or syncope.   Past Medical History:  Diagnosis Date  . A-fib ([place])   . Hypertension     Past Surgical History:  Procedure Laterality Date  . KNEE SURGERY     "removed crystals"  . TONSILLECTOMY AND ADENOIDECTOMY    . UMBILICAL HERNIA REPAIR       Current Outpatient Medications  Medication Sig Dispense Refill  . amLODipine (NORVASC) 5 MG tablet Take 1 tablet by mouth daily.  0  . docusate calcium (SURFAK) 240 MG capsule Take 240 mg by mouth daily.    Marland Kitchen dutasteride (AVODART) 0.5 MG capsule Take 1 capsule by mouth daily.  2  . lovastatin (MEVACOR) 20 MG tablet Take 20 mg by mouth daily.  1  . metoprolol tartrate (LOPRESSOR) 25 MG tablet Take 1 tablet by mouth 2 (two) times daily.    . tamsulosin (FLOMAX) 0.4 MG CAPS capsule Take 1 capsule by mouth daily.    Marland Kitchen  warfarin (COUMADIN) 2 MG tablet Take 1 tablet (2 mg total) by mouth one time only at 6 PM. 30 tablet 0   No current facility-administered medications for this visit.    Allergies:   Patient has no known allergies.    ROS:  Please see the history of present illness.   Otherwise, review of systems are positive for none.   All other systems are reviewed and negative.    PHYSICAL EXAM: VS:  BP (!) 152/95   Pulse 97   Temp (!) 96 F (35.6 C)   Ht 5\' 10"  (1.778 m)   Wt 186 lb (84.4 kg)   SpO2 99%   BMI 26.69 kg/m  , BMI Body mass index is 26.69 kg/m.  GENERAL:  Well appearing NECK:  No jugular venous distention, waveform within normal limits, carotid upstroke brisk and symmetric, no bruits, no thyromegaly LUNGS:  Clear to auscultation bilaterally CHEST:  Unremarkable HEART:  PMI not displaced or sustained,S1 and S2 within normal limits, no S3, no clicks, no rubs, no murmurs, irregular ABD:  Flat, positive bowel sounds normal in frequency in pitch, no bruits, no rebound, no guarding, no midline pulsatile mass, no hepatomegaly, no splenomegaly EXT:  2 plus pulses throughout, no edema, no cyanosis  no clubbing   EKG:  EKG is  ordered today. The ekg ordered today demonstrates atrial fibrillation, rate 97, premature ventricular contractions, poor anterior R-wave progression, Richards voltage in leads, intervals within normal limits, no acute ST-T wave changes.     Recent Labs: 05/14/2019: ALT 20 05/19/2019: BUN 16; Creatinine, Ser 0.84; Hemoglobin 10.8; Magnesium 1.9; Platelets 146; Potassium 4.2; Sodium 131    Lipid Panel No results found for: CHOL, TRIG, HDL, CHOLHDL, VLDL, LDLCALC, LDLDIRECT    Wt Readings from Last 3 Encounters:  06/07/19 186 lb (84.4 kg)  05/15/19 195 lb 1.7 oz (88.5 kg)  01/22/19 192 lb 9.6 oz (87.4 kg)      Other studies Reviewed: Additional studies/ records that were reviewed today include: .Hospital records Review of the above records demonstrates:  NA    ASSESSMENT AND PLAN:  ATRIAL FIB:  The patient has chronic atrial fibrillation. [name] has a CHA2DS2 - VASc score of 3 .  His digoxin was stopped but the  rate is reasonable.  I reviewed heart rate report that he keeps.  He will let me know if he starts running over 100 at which point I might make a change to his meds.  He tolerates anticoagulation and is up-to-date with follow-up.   HTN: Blood pressure is okay off the triamterene hydrochlorothiazide.  I am okay to keep him off of this.  He will keep a blood pressure diary.  I reviewed the diary today and his blood pressures are reasonable.   COVID EDUCATION: He has had his first dose of the vaccine.  No change in therapy.  Current medicines are reviewed at length with the patient today.  The patient does not have concerns regarding medicines.  The following changes have been made:  None  Labs/ tests ordered today include: None  Orders Placed This Encounter  Procedures  . EKG 12-Lead    Disposition:   FU with me in six months  Signed, Minus Breeding, MD  06/07/2019 5:57 PM    [place]

## 2019-06-07 ENCOUNTER — Ambulatory Visit (INDEPENDENT_AMBULATORY_CARE_PROVIDER_SITE_OTHER): Payer: Medicare Other | Admitting: Cardiology

## 2019-06-07 ENCOUNTER — Other Ambulatory Visit: Payer: Self-pay

## 2019-06-07 ENCOUNTER — Encounter: Payer: Self-pay | Admitting: Cardiology

## 2019-06-07 VITALS — BP 152/95 | HR 97 | Temp 96.0°F | Ht 70.0 in | Wt 186.0 lb

## 2019-06-07 DIAGNOSIS — M4856XD Collapsed vertebra, not elsewhere classified, lumbar region, subsequent encounter for fracture with routine healing: Secondary | ICD-10-CM | POA: Diagnosis not present

## 2019-06-07 DIAGNOSIS — M5134 Other intervertebral disc degeneration, thoracic region: Secondary | ICD-10-CM | POA: Diagnosis not present

## 2019-06-07 DIAGNOSIS — I482 Chronic atrial fibrillation, unspecified: Secondary | ICD-10-CM

## 2019-06-07 DIAGNOSIS — M48061 Spinal stenosis, lumbar region without neurogenic claudication: Secondary | ICD-10-CM | POA: Diagnosis not present

## 2019-06-07 DIAGNOSIS — Z7189 Other specified counseling: Secondary | ICD-10-CM | POA: Diagnosis not present

## 2019-06-07 DIAGNOSIS — I10 Essential (primary) hypertension: Secondary | ICD-10-CM

## 2019-06-07 DIAGNOSIS — M4854XD Collapsed vertebra, not elsewhere classified, thoracic region, subsequent encounter for fracture with routine healing: Secondary | ICD-10-CM | POA: Diagnosis not present

## 2019-06-07 DIAGNOSIS — M4804 Spinal stenosis, thoracic region: Secondary | ICD-10-CM | POA: Diagnosis not present

## 2019-06-07 DIAGNOSIS — M4805 Spinal stenosis, thoracolumbar region: Secondary | ICD-10-CM | POA: Diagnosis not present

## 2019-06-07 NOTE — Patient Instructions (Signed)
Medication Instructions:  No changes *If you need a refill on your cardiac medications before your next appointment, please call your pharmacy*  Lab Work: None If you have labs (blood work) drawn today and your tests are completely normal, you will receive your results only by: Marland Kitchen MyChart Message (if you have MyChart) OR . A paper copy in the mail If you have any lab test that is abnormal or we need to change your treatment, we will call you to review the results.  Testing/Procedures: None  Follow-Up: At [facility], you and your health needs are our priority.  As part of our continuing mission to provide you with exceptional heart care, we have created designated Provider Care Teams.  These Care Teams include your primary Cardiologist (physician) and Advanced Practice Providers (APPs -  Physician Assistants and Nurse Practitioners) who all work together to provide you with the care you need, when you need it.  Your next appointment:   6 month(s)  You will receive a reminder letter in the mail two months in advance. If you don't receive a letter, please call our office to schedule the follow-up appointment.   The format for your next appointment:   In Person  Provider:   Minus Breeding, MD

## 2019-06-08 DIAGNOSIS — M4854XD Collapsed vertebra, not elsewhere classified, thoracic region, subsequent encounter for fracture with routine healing: Secondary | ICD-10-CM | POA: Diagnosis not present

## 2019-06-08 DIAGNOSIS — M4856XD Collapsed vertebra, not elsewhere classified, lumbar region, subsequent encounter for fracture with routine healing: Secondary | ICD-10-CM | POA: Diagnosis not present

## 2019-06-08 DIAGNOSIS — M48061 Spinal stenosis, lumbar region without neurogenic claudication: Secondary | ICD-10-CM | POA: Diagnosis not present

## 2019-06-08 DIAGNOSIS — M4804 Spinal stenosis, thoracic region: Secondary | ICD-10-CM | POA: Diagnosis not present

## 2019-06-08 DIAGNOSIS — M5134 Other intervertebral disc degeneration, thoracic region: Secondary | ICD-10-CM | POA: Diagnosis not present

## 2019-06-08 DIAGNOSIS — M4805 Spinal stenosis, thoracolumbar region: Secondary | ICD-10-CM | POA: Diagnosis not present

## 2019-06-11 ENCOUNTER — Other Ambulatory Visit: Payer: Self-pay | Admitting: *Deleted

## 2019-06-11 DIAGNOSIS — M549 Dorsalgia, unspecified: Secondary | ICD-10-CM | POA: Diagnosis not present

## 2019-06-11 DIAGNOSIS — Z6826 Body mass index (BMI) 26.0-26.9, adult: Secondary | ICD-10-CM | POA: Diagnosis not present

## 2019-06-11 DIAGNOSIS — M4856XD Collapsed vertebra, not elsewhere classified, lumbar region, subsequent encounter for fracture with routine healing: Secondary | ICD-10-CM | POA: Diagnosis not present

## 2019-06-11 DIAGNOSIS — M5136 Other intervertebral disc degeneration, lumbar region: Secondary | ICD-10-CM | POA: Diagnosis not present

## 2019-06-11 DIAGNOSIS — M4805 Spinal stenosis, thoracolumbar region: Secondary | ICD-10-CM | POA: Diagnosis not present

## 2019-06-11 DIAGNOSIS — M5416 Radiculopathy, lumbar region: Secondary | ICD-10-CM | POA: Diagnosis not present

## 2019-06-11 DIAGNOSIS — M48061 Spinal stenosis, lumbar region without neurogenic claudication: Secondary | ICD-10-CM | POA: Diagnosis not present

## 2019-06-11 DIAGNOSIS — M4854XD Collapsed vertebra, not elsewhere classified, thoracic region, subsequent encounter for fracture with routine healing: Secondary | ICD-10-CM | POA: Diagnosis not present

## 2019-06-11 DIAGNOSIS — M48062 Spinal stenosis, lumbar region with neurogenic claudication: Secondary | ICD-10-CM | POA: Diagnosis not present

## 2019-06-11 DIAGNOSIS — M47816 Spondylosis without myelopathy or radiculopathy, lumbar region: Secondary | ICD-10-CM | POA: Diagnosis not present

## 2019-06-11 DIAGNOSIS — I10 Essential (primary) hypertension: Secondary | ICD-10-CM | POA: Diagnosis not present

## 2019-06-11 DIAGNOSIS — M4804 Spinal stenosis, thoracic region: Secondary | ICD-10-CM | POA: Diagnosis not present

## 2019-06-11 DIAGNOSIS — M5134 Other intervertebral disc degeneration, thoracic region: Secondary | ICD-10-CM | POA: Diagnosis not present

## 2019-06-11 NOTE — Patient Outreach (Signed)
[facility]) Care Management  06/11/2019  Adam Richards 18-Feb-1927 [id number]   Call placed to member to follow up on appointment with neurosurgeon.  State he is doing well, has appointment with them this afternoon.  Also has appointment with PCP on Wednesday this week.  He continues to stay with his daughter for right now and has home health nursing and PT.   Has been monitoring his heart rate and BP daily, today report systolic [id number], which is the highest it's been, usually 130s/80s.  Report heart rate 70-80's.  Denies any further needs at this time, hoping to be going back home within the next week.  He will contact this care manager should needs change.  [name], [place], MSN [place] [phone]

## 2019-06-14 DIAGNOSIS — M4804 Spinal stenosis, thoracic region: Secondary | ICD-10-CM | POA: Diagnosis not present

## 2019-06-14 DIAGNOSIS — M4854XD Collapsed vertebra, not elsewhere classified, thoracic region, subsequent encounter for fracture with routine healing: Secondary | ICD-10-CM | POA: Diagnosis not present

## 2019-06-14 DIAGNOSIS — M4856XD Collapsed vertebra, not elsewhere classified, lumbar region, subsequent encounter for fracture with routine healing: Secondary | ICD-10-CM | POA: Diagnosis not present

## 2019-06-14 DIAGNOSIS — I10 Essential (primary) hypertension: Secondary | ICD-10-CM | POA: Diagnosis not present

## 2019-06-14 DIAGNOSIS — M5134 Other intervertebral disc degeneration, thoracic region: Secondary | ICD-10-CM | POA: Diagnosis not present

## 2019-06-14 DIAGNOSIS — S32000A Wedge compression fracture of unspecified lumbar vertebra, initial encounter for closed fracture: Secondary | ICD-10-CM | POA: Diagnosis not present

## 2019-06-14 DIAGNOSIS — M48061 Spinal stenosis, lumbar region without neurogenic claudication: Secondary | ICD-10-CM | POA: Diagnosis not present

## 2019-06-14 DIAGNOSIS — D6869 Other thrombophilia: Secondary | ICD-10-CM | POA: Diagnosis not present

## 2019-06-14 DIAGNOSIS — M4805 Spinal stenosis, thoracolumbar region: Secondary | ICD-10-CM | POA: Diagnosis not present

## 2019-06-14 DIAGNOSIS — I4891 Unspecified atrial fibrillation: Secondary | ICD-10-CM | POA: Diagnosis not present

## 2019-06-18 DIAGNOSIS — M4856XD Collapsed vertebra, not elsewhere classified, lumbar region, subsequent encounter for fracture with routine healing: Secondary | ICD-10-CM | POA: Diagnosis not present

## 2019-06-18 DIAGNOSIS — M5134 Other intervertebral disc degeneration, thoracic region: Secondary | ICD-10-CM | POA: Diagnosis not present

## 2019-06-18 DIAGNOSIS — M4854XD Collapsed vertebra, not elsewhere classified, thoracic region, subsequent encounter for fracture with routine healing: Secondary | ICD-10-CM | POA: Diagnosis not present

## 2019-06-18 DIAGNOSIS — M48061 Spinal stenosis, lumbar region without neurogenic claudication: Secondary | ICD-10-CM | POA: Diagnosis not present

## 2019-06-18 DIAGNOSIS — M4805 Spinal stenosis, thoracolumbar region: Secondary | ICD-10-CM | POA: Diagnosis not present

## 2019-06-18 DIAGNOSIS — M4804 Spinal stenosis, thoracic region: Secondary | ICD-10-CM | POA: Diagnosis not present

## 2019-06-20 DIAGNOSIS — M4316 Spondylolisthesis, lumbar region: Secondary | ICD-10-CM | POA: Diagnosis not present

## 2019-06-20 DIAGNOSIS — J90 Pleural effusion, not elsewhere classified: Secondary | ICD-10-CM | POA: Diagnosis not present

## 2019-06-20 DIAGNOSIS — F17210 Nicotine dependence, cigarettes, uncomplicated: Secondary | ICD-10-CM | POA: Diagnosis not present

## 2019-06-20 DIAGNOSIS — M4856XD Collapsed vertebra, not elsewhere classified, lumbar region, subsequent encounter for fracture with routine healing: Secondary | ICD-10-CM | POA: Diagnosis not present

## 2019-06-20 DIAGNOSIS — M2578 Osteophyte, vertebrae: Secondary | ICD-10-CM | POA: Diagnosis not present

## 2019-06-20 DIAGNOSIS — M8580 Other specified disorders of bone density and structure, unspecified site: Secondary | ICD-10-CM | POA: Diagnosis not present

## 2019-06-20 DIAGNOSIS — M48061 Spinal stenosis, lumbar region without neurogenic claudication: Secondary | ICD-10-CM | POA: Diagnosis not present

## 2019-06-20 DIAGNOSIS — M4854XD Collapsed vertebra, not elsewhere classified, thoracic region, subsequent encounter for fracture with routine healing: Secondary | ICD-10-CM | POA: Diagnosis not present

## 2019-06-20 DIAGNOSIS — M5124 Other intervertebral disc displacement, thoracic region: Secondary | ICD-10-CM | POA: Diagnosis not present

## 2019-06-20 DIAGNOSIS — M5134 Other intervertebral disc degeneration, thoracic region: Secondary | ICD-10-CM | POA: Diagnosis not present

## 2019-06-20 DIAGNOSIS — M4805 Spinal stenosis, thoracolumbar region: Secondary | ICD-10-CM | POA: Diagnosis not present

## 2019-06-20 DIAGNOSIS — I119 Hypertensive heart disease without heart failure: Secondary | ICD-10-CM | POA: Diagnosis not present

## 2019-06-20 DIAGNOSIS — E871 Hypo-osmolality and hyponatremia: Secondary | ICD-10-CM | POA: Diagnosis not present

## 2019-06-20 DIAGNOSIS — M4186 Other forms of scoliosis, lumbar region: Secondary | ICD-10-CM | POA: Diagnosis not present

## 2019-06-20 DIAGNOSIS — M5126 Other intervertebral disc displacement, lumbar region: Secondary | ICD-10-CM | POA: Diagnosis not present

## 2019-06-20 DIAGNOSIS — I700 Atherosclerosis of aorta: Secondary | ICD-10-CM | POA: Diagnosis not present

## 2019-06-20 DIAGNOSIS — D649 Anemia, unspecified: Secondary | ICD-10-CM | POA: Diagnosis not present

## 2019-06-20 DIAGNOSIS — M5125 Other intervertebral disc displacement, thoracolumbar region: Secondary | ICD-10-CM | POA: Diagnosis not present

## 2019-06-20 DIAGNOSIS — M4804 Spinal stenosis, thoracic region: Secondary | ICD-10-CM | POA: Diagnosis not present

## 2019-06-20 DIAGNOSIS — J9811 Atelectasis: Secondary | ICD-10-CM | POA: Diagnosis not present

## 2019-06-20 DIAGNOSIS — I701 Atherosclerosis of renal artery: Secondary | ICD-10-CM | POA: Diagnosis not present

## 2019-06-20 DIAGNOSIS — M47814 Spondylosis without myelopathy or radiculopathy, thoracic region: Secondary | ICD-10-CM | POA: Diagnosis not present

## 2019-06-20 DIAGNOSIS — Z23 Encounter for immunization: Secondary | ICD-10-CM | POA: Diagnosis not present

## 2019-06-20 DIAGNOSIS — I482 Chronic atrial fibrillation, unspecified: Secondary | ICD-10-CM | POA: Diagnosis not present

## 2019-06-20 DIAGNOSIS — M47817 Spondylosis without myelopathy or radiculopathy, lumbosacral region: Secondary | ICD-10-CM | POA: Diagnosis not present

## 2019-06-20 DIAGNOSIS — K5730 Diverticulosis of large intestine without perforation or abscess without bleeding: Secondary | ICD-10-CM | POA: Diagnosis not present

## 2019-06-22 DIAGNOSIS — M48061 Spinal stenosis, lumbar region without neurogenic claudication: Secondary | ICD-10-CM | POA: Diagnosis not present

## 2019-06-22 DIAGNOSIS — M4854XD Collapsed vertebra, not elsewhere classified, thoracic region, subsequent encounter for fracture with routine healing: Secondary | ICD-10-CM | POA: Diagnosis not present

## 2019-06-22 DIAGNOSIS — M4856XD Collapsed vertebra, not elsewhere classified, lumbar region, subsequent encounter for fracture with routine healing: Secondary | ICD-10-CM | POA: Diagnosis not present

## 2019-06-22 DIAGNOSIS — M4805 Spinal stenosis, thoracolumbar region: Secondary | ICD-10-CM | POA: Diagnosis not present

## 2019-06-22 DIAGNOSIS — M4804 Spinal stenosis, thoracic region: Secondary | ICD-10-CM | POA: Diagnosis not present

## 2019-06-22 DIAGNOSIS — M5134 Other intervertebral disc degeneration, thoracic region: Secondary | ICD-10-CM | POA: Diagnosis not present

## 2019-06-26 DIAGNOSIS — M4856XD Collapsed vertebra, not elsewhere classified, lumbar region, subsequent encounter for fracture with routine healing: Secondary | ICD-10-CM | POA: Diagnosis not present

## 2019-06-26 DIAGNOSIS — M4804 Spinal stenosis, thoracic region: Secondary | ICD-10-CM | POA: Diagnosis not present

## 2019-06-26 DIAGNOSIS — M4854XD Collapsed vertebra, not elsewhere classified, thoracic region, subsequent encounter for fracture with routine healing: Secondary | ICD-10-CM | POA: Diagnosis not present

## 2019-06-26 DIAGNOSIS — M48061 Spinal stenosis, lumbar region without neurogenic claudication: Secondary | ICD-10-CM | POA: Diagnosis not present

## 2019-06-26 DIAGNOSIS — M4805 Spinal stenosis, thoracolumbar region: Secondary | ICD-10-CM | POA: Diagnosis not present

## 2019-06-26 DIAGNOSIS — M5134 Other intervertebral disc degeneration, thoracic region: Secondary | ICD-10-CM | POA: Diagnosis not present

## 2019-06-28 DIAGNOSIS — M4804 Spinal stenosis, thoracic region: Secondary | ICD-10-CM | POA: Diagnosis not present

## 2019-06-28 DIAGNOSIS — M4856XD Collapsed vertebra, not elsewhere classified, lumbar region, subsequent encounter for fracture with routine healing: Secondary | ICD-10-CM | POA: Diagnosis not present

## 2019-06-28 DIAGNOSIS — M4854XD Collapsed vertebra, not elsewhere classified, thoracic region, subsequent encounter for fracture with routine healing: Secondary | ICD-10-CM | POA: Diagnosis not present

## 2019-06-28 DIAGNOSIS — M48061 Spinal stenosis, lumbar region without neurogenic claudication: Secondary | ICD-10-CM | POA: Diagnosis not present

## 2019-06-28 DIAGNOSIS — M5134 Other intervertebral disc degeneration, thoracic region: Secondary | ICD-10-CM | POA: Diagnosis not present

## 2019-06-28 DIAGNOSIS — M4805 Spinal stenosis, thoracolumbar region: Secondary | ICD-10-CM | POA: Diagnosis not present

## 2019-07-02 DIAGNOSIS — Z7901 Long term (current) use of anticoagulants: Secondary | ICD-10-CM | POA: Diagnosis not present

## 2019-07-03 DIAGNOSIS — M5134 Other intervertebral disc degeneration, thoracic region: Secondary | ICD-10-CM | POA: Diagnosis not present

## 2019-07-03 DIAGNOSIS — M4854XD Collapsed vertebra, not elsewhere classified, thoracic region, subsequent encounter for fracture with routine healing: Secondary | ICD-10-CM | POA: Diagnosis not present

## 2019-07-03 DIAGNOSIS — M4804 Spinal stenosis, thoracic region: Secondary | ICD-10-CM | POA: Diagnosis not present

## 2019-07-03 DIAGNOSIS — M4805 Spinal stenosis, thoracolumbar region: Secondary | ICD-10-CM | POA: Diagnosis not present

## 2019-07-03 DIAGNOSIS — M4856XD Collapsed vertebra, not elsewhere classified, lumbar region, subsequent encounter for fracture with routine healing: Secondary | ICD-10-CM | POA: Diagnosis not present

## 2019-07-03 DIAGNOSIS — M48061 Spinal stenosis, lumbar region without neurogenic claudication: Secondary | ICD-10-CM | POA: Diagnosis not present

## 2019-07-09 DIAGNOSIS — Z7901 Long term (current) use of anticoagulants: Secondary | ICD-10-CM | POA: Diagnosis not present

## 2019-07-10 DIAGNOSIS — M48061 Spinal stenosis, lumbar region without neurogenic claudication: Secondary | ICD-10-CM | POA: Diagnosis not present

## 2019-07-10 DIAGNOSIS — M4856XD Collapsed vertebra, not elsewhere classified, lumbar region, subsequent encounter for fracture with routine healing: Secondary | ICD-10-CM | POA: Diagnosis not present

## 2019-07-10 DIAGNOSIS — M4804 Spinal stenosis, thoracic region: Secondary | ICD-10-CM | POA: Diagnosis not present

## 2019-07-10 DIAGNOSIS — M4805 Spinal stenosis, thoracolumbar region: Secondary | ICD-10-CM | POA: Diagnosis not present

## 2019-07-10 DIAGNOSIS — M5134 Other intervertebral disc degeneration, thoracic region: Secondary | ICD-10-CM | POA: Diagnosis not present

## 2019-07-10 DIAGNOSIS — M4854XD Collapsed vertebra, not elsewhere classified, thoracic region, subsequent encounter for fracture with routine healing: Secondary | ICD-10-CM | POA: Diagnosis not present

## 2019-07-18 DIAGNOSIS — L578 Other skin changes due to chronic exposure to nonionizing radiation: Secondary | ICD-10-CM | POA: Diagnosis not present

## 2019-07-18 DIAGNOSIS — L570 Actinic keratosis: Secondary | ICD-10-CM | POA: Diagnosis not present

## 2019-07-18 DIAGNOSIS — L905 Scar conditions and fibrosis of skin: Secondary | ICD-10-CM | POA: Diagnosis not present

## 2019-07-18 DIAGNOSIS — D044 Carcinoma in situ of skin of scalp and neck: Secondary | ICD-10-CM | POA: Diagnosis not present

## 2019-07-18 DIAGNOSIS — Z85828 Personal history of other malignant neoplasm of skin: Secondary | ICD-10-CM | POA: Diagnosis not present

## 2019-07-18 DIAGNOSIS — D1801 Hemangioma of skin and subcutaneous tissue: Secondary | ICD-10-CM | POA: Diagnosis not present

## 2019-07-18 DIAGNOSIS — D225 Melanocytic nevi of trunk: Secondary | ICD-10-CM | POA: Diagnosis not present

## 2019-07-19 DIAGNOSIS — M4805 Spinal stenosis, thoracolumbar region: Secondary | ICD-10-CM | POA: Diagnosis not present

## 2019-07-19 DIAGNOSIS — M4856XD Collapsed vertebra, not elsewhere classified, lumbar region, subsequent encounter for fracture with routine healing: Secondary | ICD-10-CM | POA: Diagnosis not present

## 2019-07-19 DIAGNOSIS — M4854XD Collapsed vertebra, not elsewhere classified, thoracic region, subsequent encounter for fracture with routine healing: Secondary | ICD-10-CM | POA: Diagnosis not present

## 2019-07-19 DIAGNOSIS — M4804 Spinal stenosis, thoracic region: Secondary | ICD-10-CM | POA: Diagnosis not present

## 2019-07-19 DIAGNOSIS — M48061 Spinal stenosis, lumbar region without neurogenic claudication: Secondary | ICD-10-CM | POA: Diagnosis not present

## 2019-07-19 DIAGNOSIS — M5134 Other intervertebral disc degeneration, thoracic region: Secondary | ICD-10-CM | POA: Diagnosis not present

## 2019-08-06 DIAGNOSIS — Z7901 Long term (current) use of anticoagulants: Secondary | ICD-10-CM | POA: Diagnosis not present

## 2019-08-16 DIAGNOSIS — Z7901 Long term (current) use of anticoagulants: Secondary | ICD-10-CM | POA: Diagnosis not present

## 2019-08-16 DIAGNOSIS — S61412A Laceration without foreign body of left hand, initial encounter: Secondary | ICD-10-CM | POA: Diagnosis not present

## 2019-08-16 DIAGNOSIS — Z23 Encounter for immunization: Secondary | ICD-10-CM | POA: Diagnosis not present

## 2019-09-03 DIAGNOSIS — Z7901 Long term (current) use of anticoagulants: Secondary | ICD-10-CM | POA: Diagnosis not present

## 2019-09-12 DIAGNOSIS — Z1389 Encounter for screening for other disorder: Secondary | ICD-10-CM | POA: Diagnosis not present

## 2019-09-12 DIAGNOSIS — N400 Enlarged prostate without lower urinary tract symptoms: Secondary | ICD-10-CM | POA: Diagnosis not present

## 2019-09-12 DIAGNOSIS — R1900 Intra-abdominal and pelvic swelling, mass and lump, unspecified site: Secondary | ICD-10-CM | POA: Diagnosis not present

## 2019-09-12 DIAGNOSIS — E7800 Pure hypercholesterolemia, unspecified: Secondary | ICD-10-CM | POA: Diagnosis not present

## 2019-09-12 DIAGNOSIS — I10 Essential (primary) hypertension: Secondary | ICD-10-CM | POA: Diagnosis not present

## 2019-09-12 DIAGNOSIS — Z7901 Long term (current) use of anticoagulants: Secondary | ICD-10-CM | POA: Diagnosis not present

## 2019-09-12 DIAGNOSIS — I4891 Unspecified atrial fibrillation: Secondary | ICD-10-CM | POA: Diagnosis not present

## 2019-09-12 DIAGNOSIS — Z0000 Encounter for general adult medical examination without abnormal findings: Secondary | ICD-10-CM | POA: Diagnosis not present

## 2019-09-12 DIAGNOSIS — D6869 Other thrombophilia: Secondary | ICD-10-CM | POA: Diagnosis not present

## 2019-09-12 DIAGNOSIS — I700 Atherosclerosis of aorta: Secondary | ICD-10-CM | POA: Diagnosis not present

## 2019-09-19 DIAGNOSIS — R609 Edema, unspecified: Secondary | ICD-10-CM | POA: Diagnosis not present

## 2019-09-19 DIAGNOSIS — M7989 Other specified soft tissue disorders: Secondary | ICD-10-CM | POA: Diagnosis not present

## 2019-09-21 DIAGNOSIS — R1900 Intra-abdominal and pelvic swelling, mass and lump, unspecified site: Secondary | ICD-10-CM | POA: Diagnosis not present

## 2019-09-21 DIAGNOSIS — N281 Cyst of kidney, acquired: Secondary | ICD-10-CM | POA: Diagnosis not present

## 2019-09-21 DIAGNOSIS — K820 Obstruction of gallbladder: Secondary | ICD-10-CM | POA: Diagnosis not present

## 2019-09-25 DIAGNOSIS — Q6100 Congenital renal cyst, unspecified: Secondary | ICD-10-CM | POA: Diagnosis not present

## 2019-09-25 DIAGNOSIS — R609 Edema, unspecified: Secondary | ICD-10-CM | POA: Diagnosis not present

## 2019-09-26 ENCOUNTER — Other Ambulatory Visit: Payer: Self-pay | Admitting: Internal Medicine

## 2019-09-26 DIAGNOSIS — N281 Cyst of kidney, acquired: Secondary | ICD-10-CM

## 2019-10-01 DIAGNOSIS — Z7901 Long term (current) use of anticoagulants: Secondary | ICD-10-CM | POA: Diagnosis not present

## 2019-10-10 DIAGNOSIS — R600 Localized edema: Secondary | ICD-10-CM | POA: Diagnosis not present

## 2019-10-10 DIAGNOSIS — I10 Essential (primary) hypertension: Secondary | ICD-10-CM | POA: Diagnosis not present

## 2019-10-16 DIAGNOSIS — C4442 Squamous cell carcinoma of skin of scalp and neck: Secondary | ICD-10-CM | POA: Diagnosis not present

## 2019-10-16 DIAGNOSIS — Z85828 Personal history of other malignant neoplasm of skin: Secondary | ICD-10-CM | POA: Diagnosis not present

## 2019-10-16 DIAGNOSIS — I8311 Varicose veins of right lower extremity with inflammation: Secondary | ICD-10-CM | POA: Diagnosis not present

## 2019-10-16 DIAGNOSIS — D225 Melanocytic nevi of trunk: Secondary | ICD-10-CM | POA: Diagnosis not present

## 2019-10-16 DIAGNOSIS — I8312 Varicose veins of left lower extremity with inflammation: Secondary | ICD-10-CM | POA: Diagnosis not present

## 2019-10-16 DIAGNOSIS — D485 Neoplasm of uncertain behavior of skin: Secondary | ICD-10-CM | POA: Diagnosis not present

## 2019-10-16 DIAGNOSIS — L905 Scar conditions and fibrosis of skin: Secondary | ICD-10-CM | POA: Diagnosis not present

## 2019-10-16 DIAGNOSIS — L570 Actinic keratosis: Secondary | ICD-10-CM | POA: Diagnosis not present

## 2019-10-29 DIAGNOSIS — Z7901 Long term (current) use of anticoagulants: Secondary | ICD-10-CM | POA: Diagnosis not present

## 2019-11-01 ENCOUNTER — Other Ambulatory Visit: Payer: Self-pay

## 2019-11-01 ENCOUNTER — Ambulatory Visit
Admission: RE | Admit: 2019-11-01 | Discharge: 2019-11-01 | Disposition: A | Discharge status: Home / Self Care | Payer: Medicare Other | Source: Ambulatory Visit | Attending: Internal Medicine | Admitting: Internal Medicine

## 2019-11-01 DIAGNOSIS — N281 Cyst of kidney, acquired: Secondary | ICD-10-CM | POA: Diagnosis not present

## 2019-11-01 IMAGING — MR MR ABDOMEN WO/W CM
17 series · 48 of 48 positions shown · IV contrast (17ml multihance)
Comparison: Abdominal ultrasound [DATE]. Abdominal CTA
[DATE].

CLINICAL DATA: Complex cystic lesion in the left kidney on
ultrasound.

EXAM:
MRI ABDOMEN WITHOUT AND WITH CONTRAST
TECHNIQUE: Multiplanar multisequence MR imaging of the abdomen was performed
both before and after the administration of intravenous contrast.
CONTRAST:  17mL MULTIHANCE GADOBENATE DIMEGLUMINE 529 MG/ML IV SOLN

[Series 3: T2 · coronal · 5.5mm · 0.78mm/px · 1 of 25 slices shown (1 of 3)]
[im 1/25]
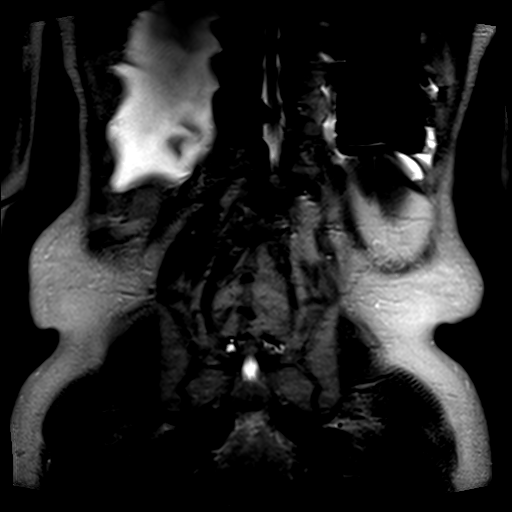

[Series 4: T2 · axial · 5.0mm · 0.66mm/px · 1 of 27 slices shown (2 of 3)]
[im 1/27]
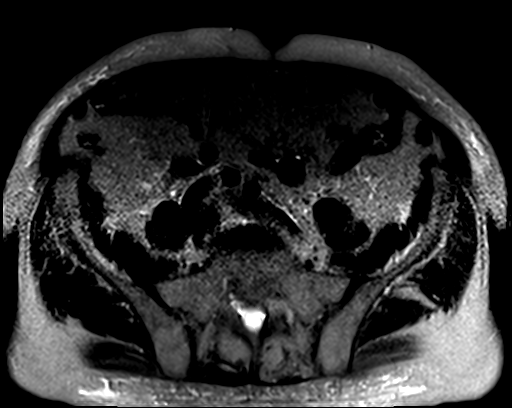

[Series 5: [id number]_trig · axial · 6.0mm · 1.98mm/px · z∈[-141,+46]mm · 2 of 68 slices shown]
[im 1/68]
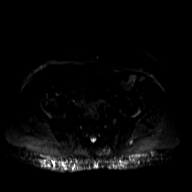
[im 68/68]
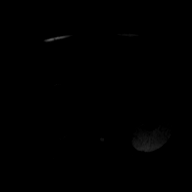

[Series 6: ep2d_[id number]_trig_adc · axial · 6.0mm · 1.98mm/px · 1 of 25 slices shown]
[im 1/25]
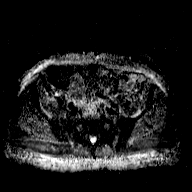

[Series 7: T2 · axial · 5.5mm · 1.48mm/px · 1 of 34 slices shown (3 of 3)]
[im 1/34]
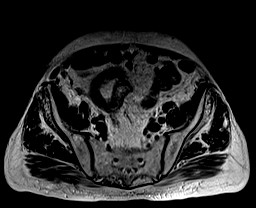

[Series 8: bSSFP · axial · 4.0mm · 0.74mm/px · 1 of 34 slices shown]
[im 1/34]
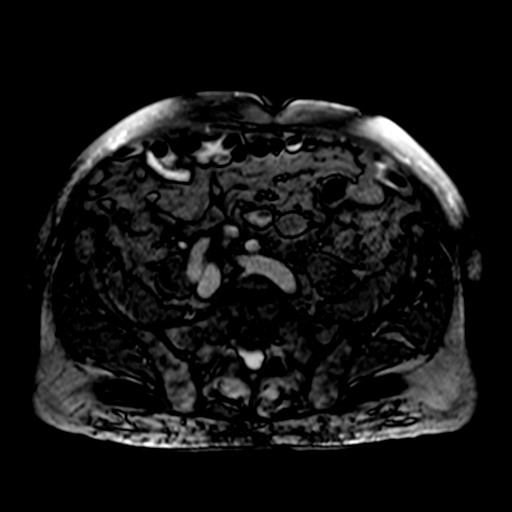

[Series 9: T1 · axial · 5.0mm · 0.66mm/px · z∈[-131,+37]mm · 2 of 58 slices shown]
[im 1/58]
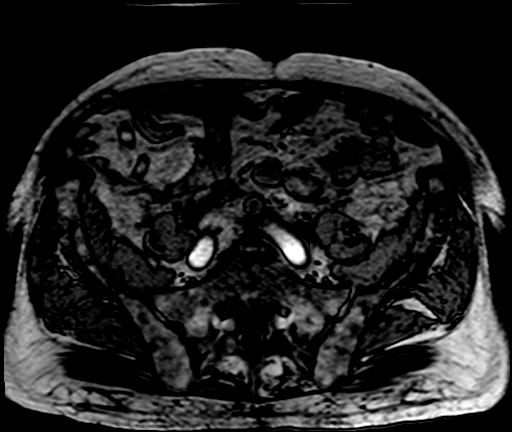
[im 58/58]
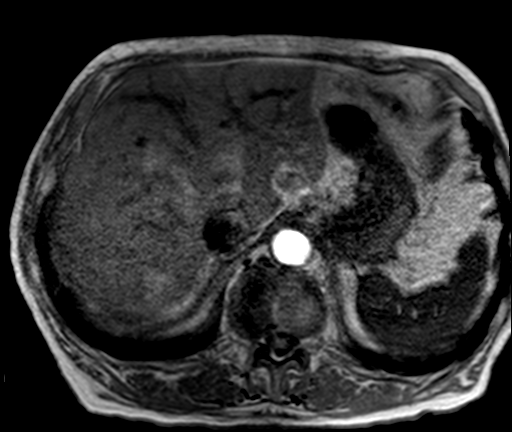

[Series 10: T1 dynamic · axial · non-contrast · 2.3mm · 1.41mm/px · z∈[-129,+35]mm · 4 of 72 slices shown]
[im 1/72]
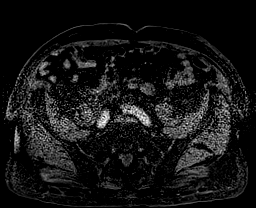
[im 24/72]
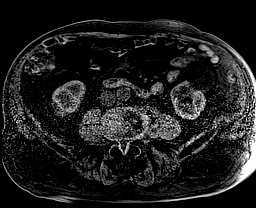
[im 48/72]
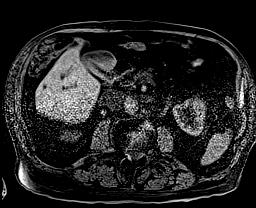
[im 72/72]
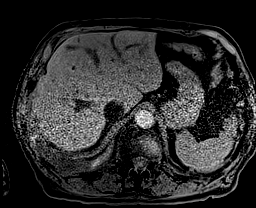

[Series 11: post 25 sec · axial · 2.3mm · 1.41mm/px · z∈[-129,+35]mm · 4 of 72 slices shown]
[im 1/72]
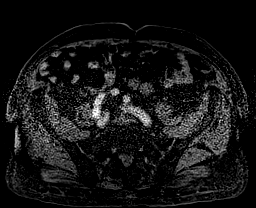
[im 24/72]
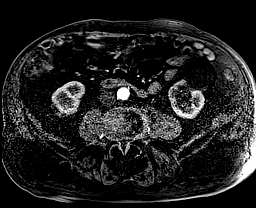
[im 48/72]
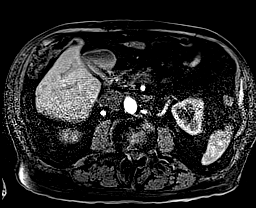
[im 72/72]
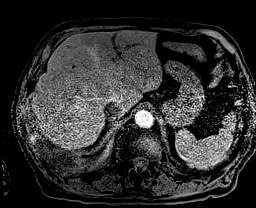

[Series 12: post 25 sec_sub · axial · 2.3mm · 1.41mm/px · z∈[-129,+35]mm · 4 of 72 slices shown]
[im 1/72]
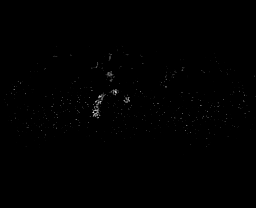
[im 24/72]
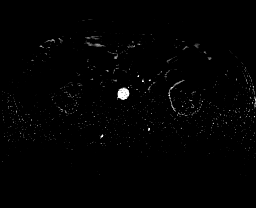
[im 48/72]
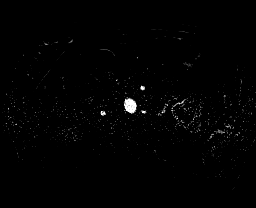
[im 72/72]
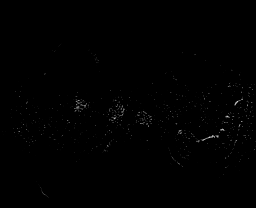

[Series 13: post 45 sec · axial · 2.3mm · 1.41mm/px · z∈[-129,+35]mm · 4 of 72 slices shown]
[im 1/72]
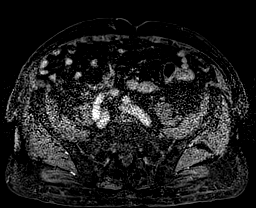
[im 24/72]
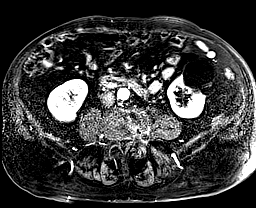
[im 48/72]
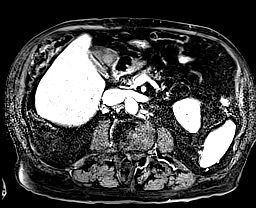
[im 72/72]
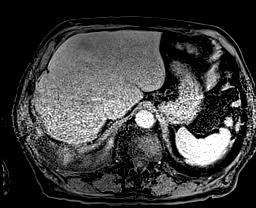

[Series 14: post 45 sec_sub · axial · 2.3mm · 1.41mm/px · z∈[-129,+35]mm · 4 of 72 slices shown]
[im 1/72]
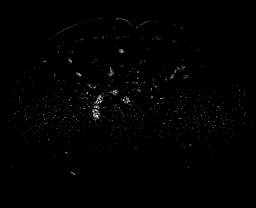
[im 24/72]
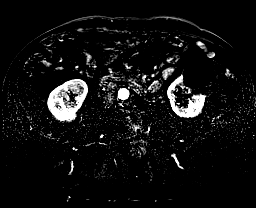
[im 48/72]
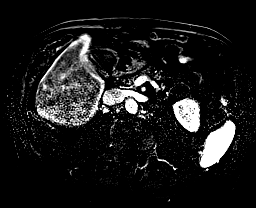
[im 72/72]
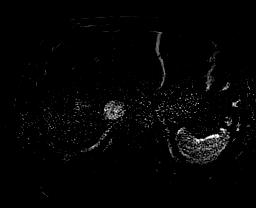

[Series 15: post 90 sec · axial · 2.3mm · 1.41mm/px · z∈[-129,+35]mm · 4 of 72 slices shown]
[im 1/72]
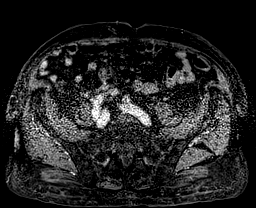
[im 24/72]
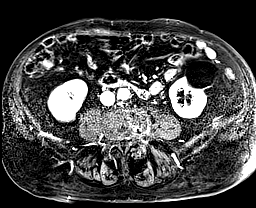
[im 48/72]
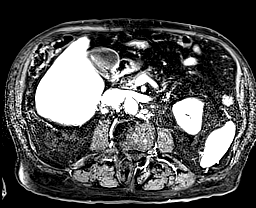
[im 72/72]
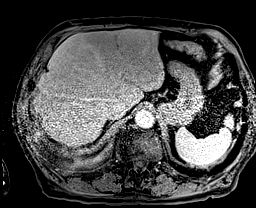

[Series 16: post 90 sec_sub · axial · 2.3mm · 1.41mm/px · z∈[-129,+35]mm · 4 of 72 slices shown]
[im 1/72]
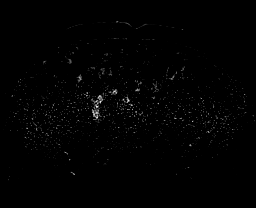
[im 24/72]
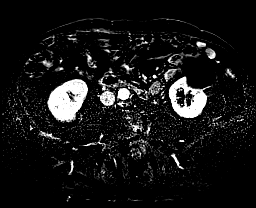
[im 48/72]
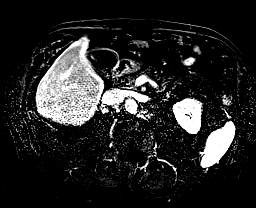
[im 72/72]
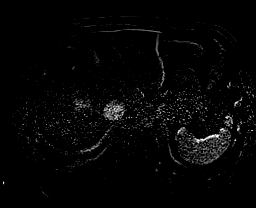

[Series 17: T1 dynamic post-contrast · coronal · 2.6mm · 0.78mm/px · 3 of 64 slices shown]
[im 1/64]
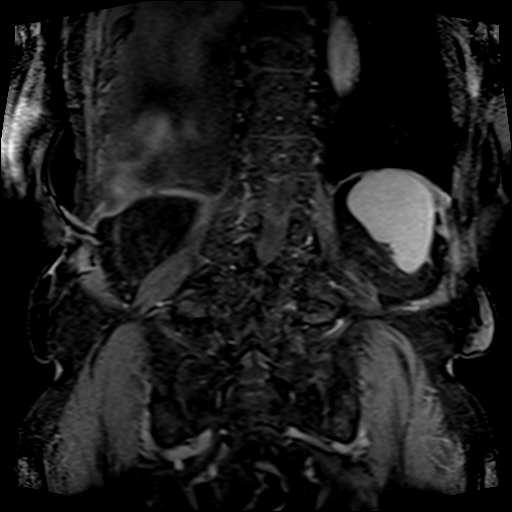
[im 32/64]
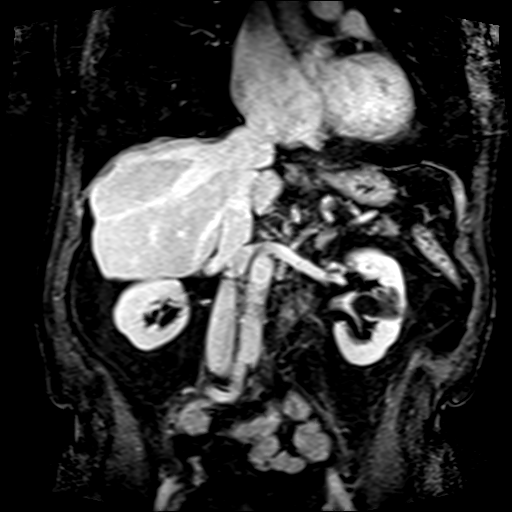
[im 64/64]
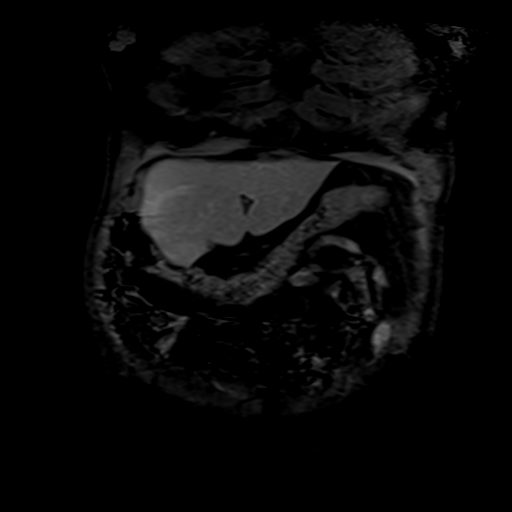

[Series 18: post axial 3+ · axial · 2.3mm · 1.41mm/px · z∈[-129,+35]mm · 4 of 72 slices shown (1 of 2)]
[im 1/72]
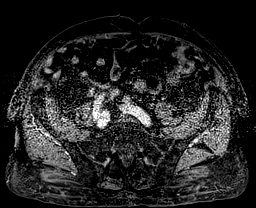
[im 24/72]
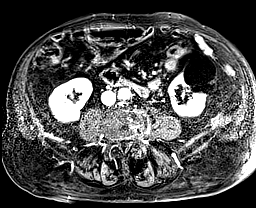
[im 48/72]
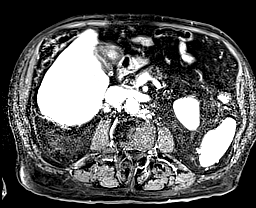
[im 72/72]
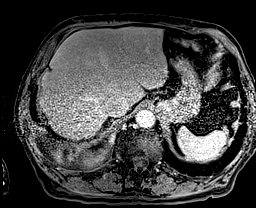

[Series 19: post axial 3+ · axial · 2.3mm · 1.41mm/px · z∈[-129,+35]mm · 4 of 72 slices shown (2 of 2)]
[im 1/72]
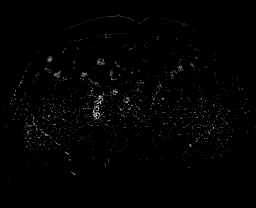
[im 24/72]
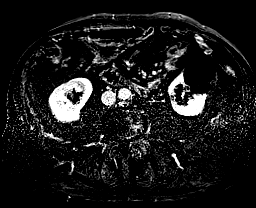
[im 48/72]
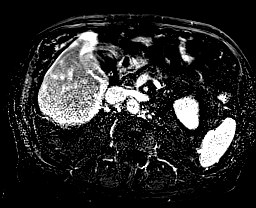
[im 72/72]
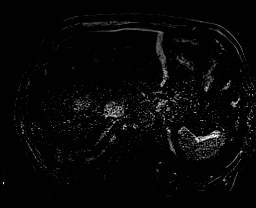

[48 of 48 positions shown; findings below may reference images not displayed]

FINDINGS: Lower chest: New moderate size dependent right pleural effusion
appears mildly complex. Trace pleural fluid on the left.

Hepatobiliary: The liver is normal in signal without focal
abnormality or abnormal enhancement. No evidence of gallstones,
gallbladder wall thickening or biliary dilatation.

Pancreas: Unremarkable. No pancreatic ductal dilatation or
surrounding inflammatory changes.

Spleen: Normal in size without focal abnormality.

Adrenals/Urinary Tract: Both adrenal glands appear normal. Dominant
septated cystic lesion in the anterior interpolar region of the left
kidney measures 6.7 x 5.9 x 6.4 cm and appears similar to previous
CT. This has thin septations which are partially calcified on CT.
There is no solid component or abnormal enhancement following
contrast. No enhancing renal mass or hydronephrosis.

Stomach/Bowel: No evidence of bowel wall thickening, distention or
surrounding inflammatory change.

Vascular/Lymphatic: There are no enlarged abdominal lymph nodes.
Atherosclerosis without evidence of aneurysm or large vessel
occlusion.

Other: Trace pelvic ascites and mesenteric edema. No focal
extraluminal fluid collection.

Musculoskeletal: No acute or significant osseous findings.
Multilevel lumbar spondylosis associated with a convex right
scoliosis. There is facet arthropathy and associated small
posteriorly projecting synovial cysts inferiorly.
IMPRESSION: 1. Stable complex septated cystic lesion in the anterior interpolar
region of the left kidney, consistent with a benign Bosniak 2 cyst.
No suspicious renal lesions.
2. New moderate size dependent right pleural effusion appears mildly
complex. Trace pleural fluid on the left.
3. Trace pelvic ascites and mesenteric edema.
4. Aortic Atherosclerosis ([AJ]-[AJ]).

## 2019-11-01 MED ORDER — GADOBENATE DIMEGLUMINE 529 MG/ML IV SOLN
17.0000 mL | Freq: Once | INTRAVENOUS | Status: AC | PRN
  Administered 2019-11-01: 17 mL via INTRAVENOUS

## 2019-11-05 DIAGNOSIS — Z7901 Long term (current) use of anticoagulants: Secondary | ICD-10-CM | POA: Diagnosis not present

## 2019-11-07 ENCOUNTER — Other Ambulatory Visit: Payer: Self-pay | Admitting: Internal Medicine

## 2019-11-29 ENCOUNTER — Other Ambulatory Visit: Payer: Self-pay | Admitting: Internal Medicine

## 2019-11-29 ENCOUNTER — Ambulatory Visit
Admission: RE | Admit: 2019-11-29 | Discharge: 2019-11-29 | Disposition: A | Discharge status: Home / Self Care | Payer: Medicare Other | Source: Ambulatory Visit | Attending: Internal Medicine | Admitting: Internal Medicine

## 2019-11-29 DIAGNOSIS — J90 Pleural effusion, not elsewhere classified: Secondary | ICD-10-CM

## 2019-11-29 IMAGING — DX DG CHEST 2V
2 series · 2 of 2 positions shown · non-contrast
Comparison: Abdominal MRI including lung bases [DATE]. Chest
radiograph [DATE]; chest radiograph [DATE]

CLINICAL DATA: Recent right pleural effusion

EXAM:
CHEST - 2 VIEW

[dg chest 2 view (1 of 2)]
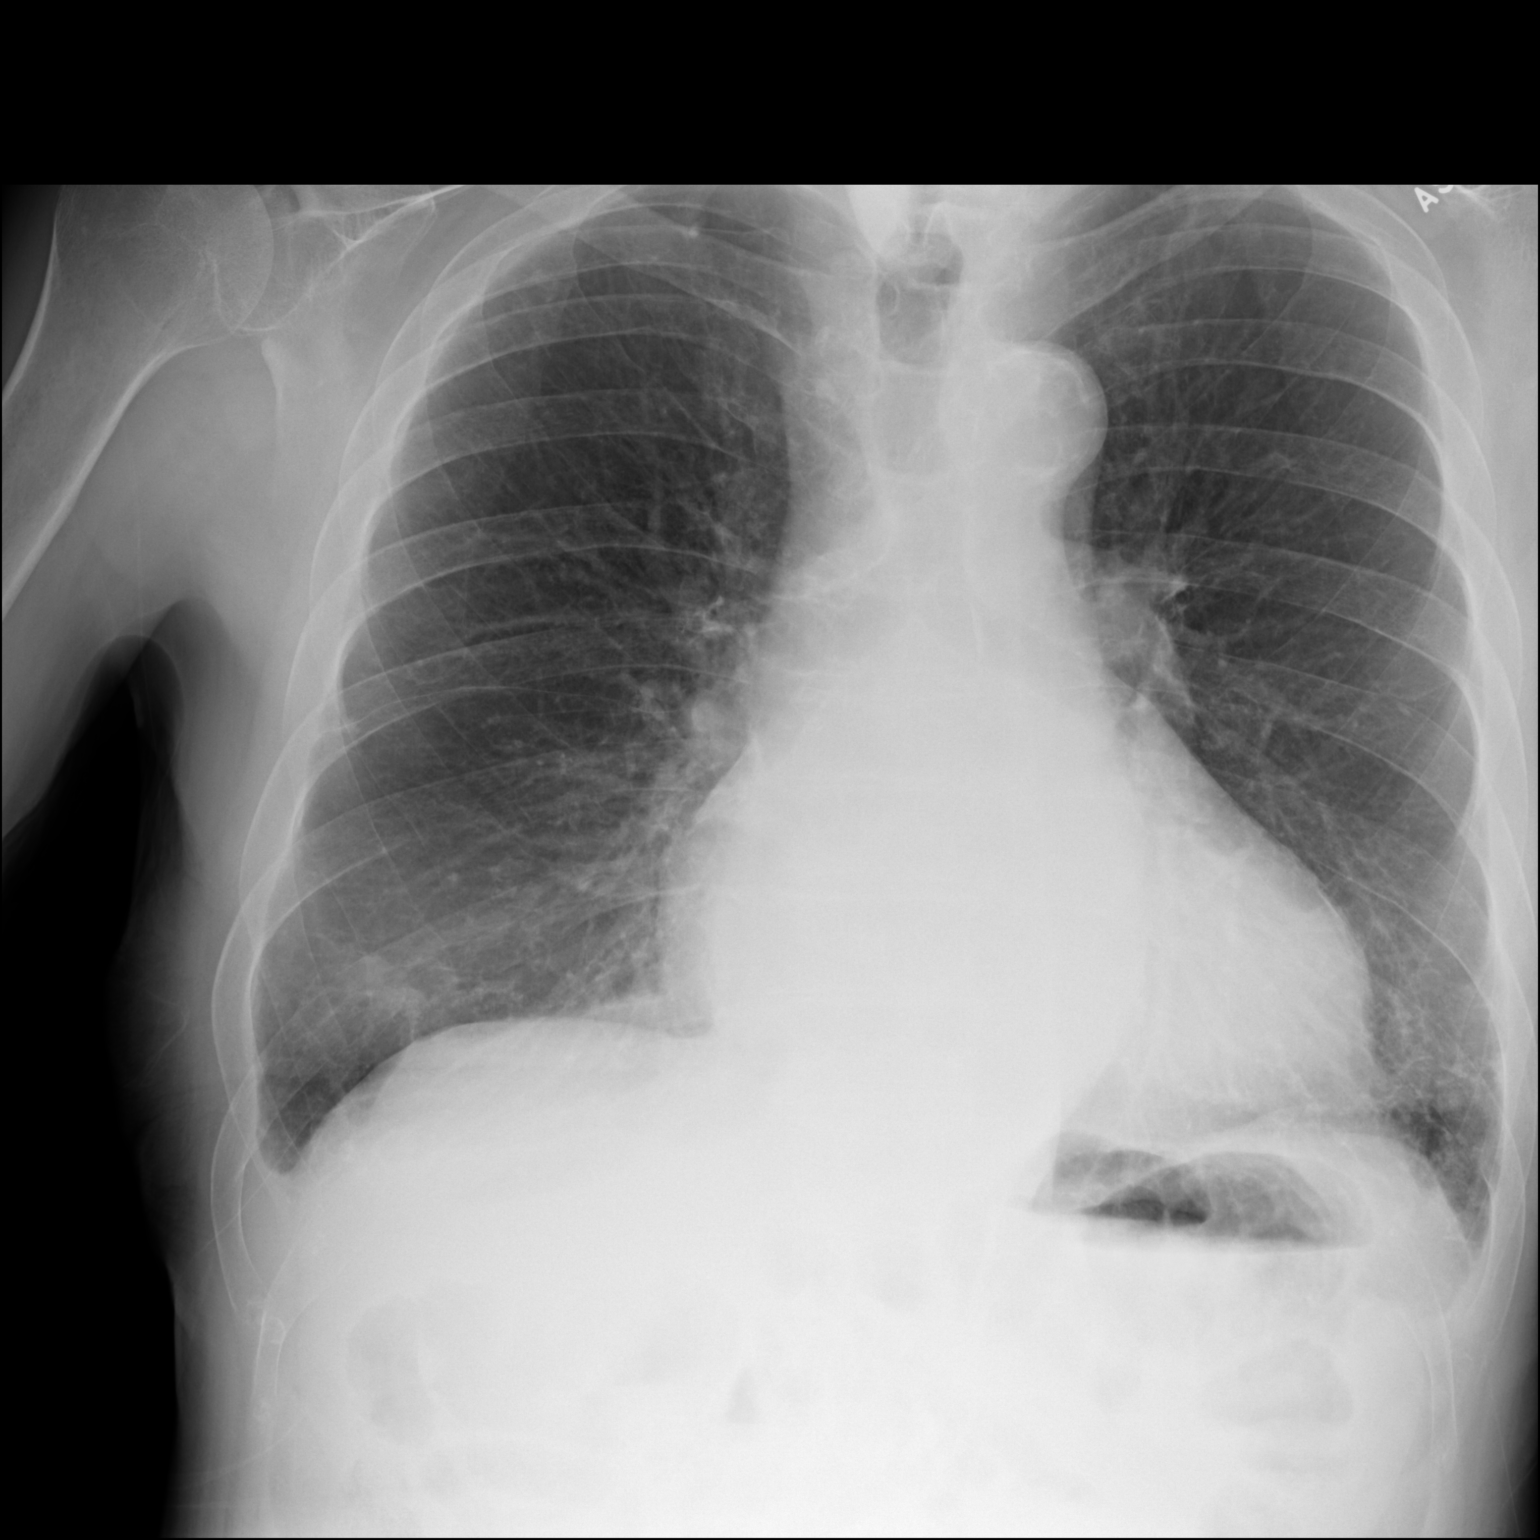

[dg chest 2 view (2 of 2)]
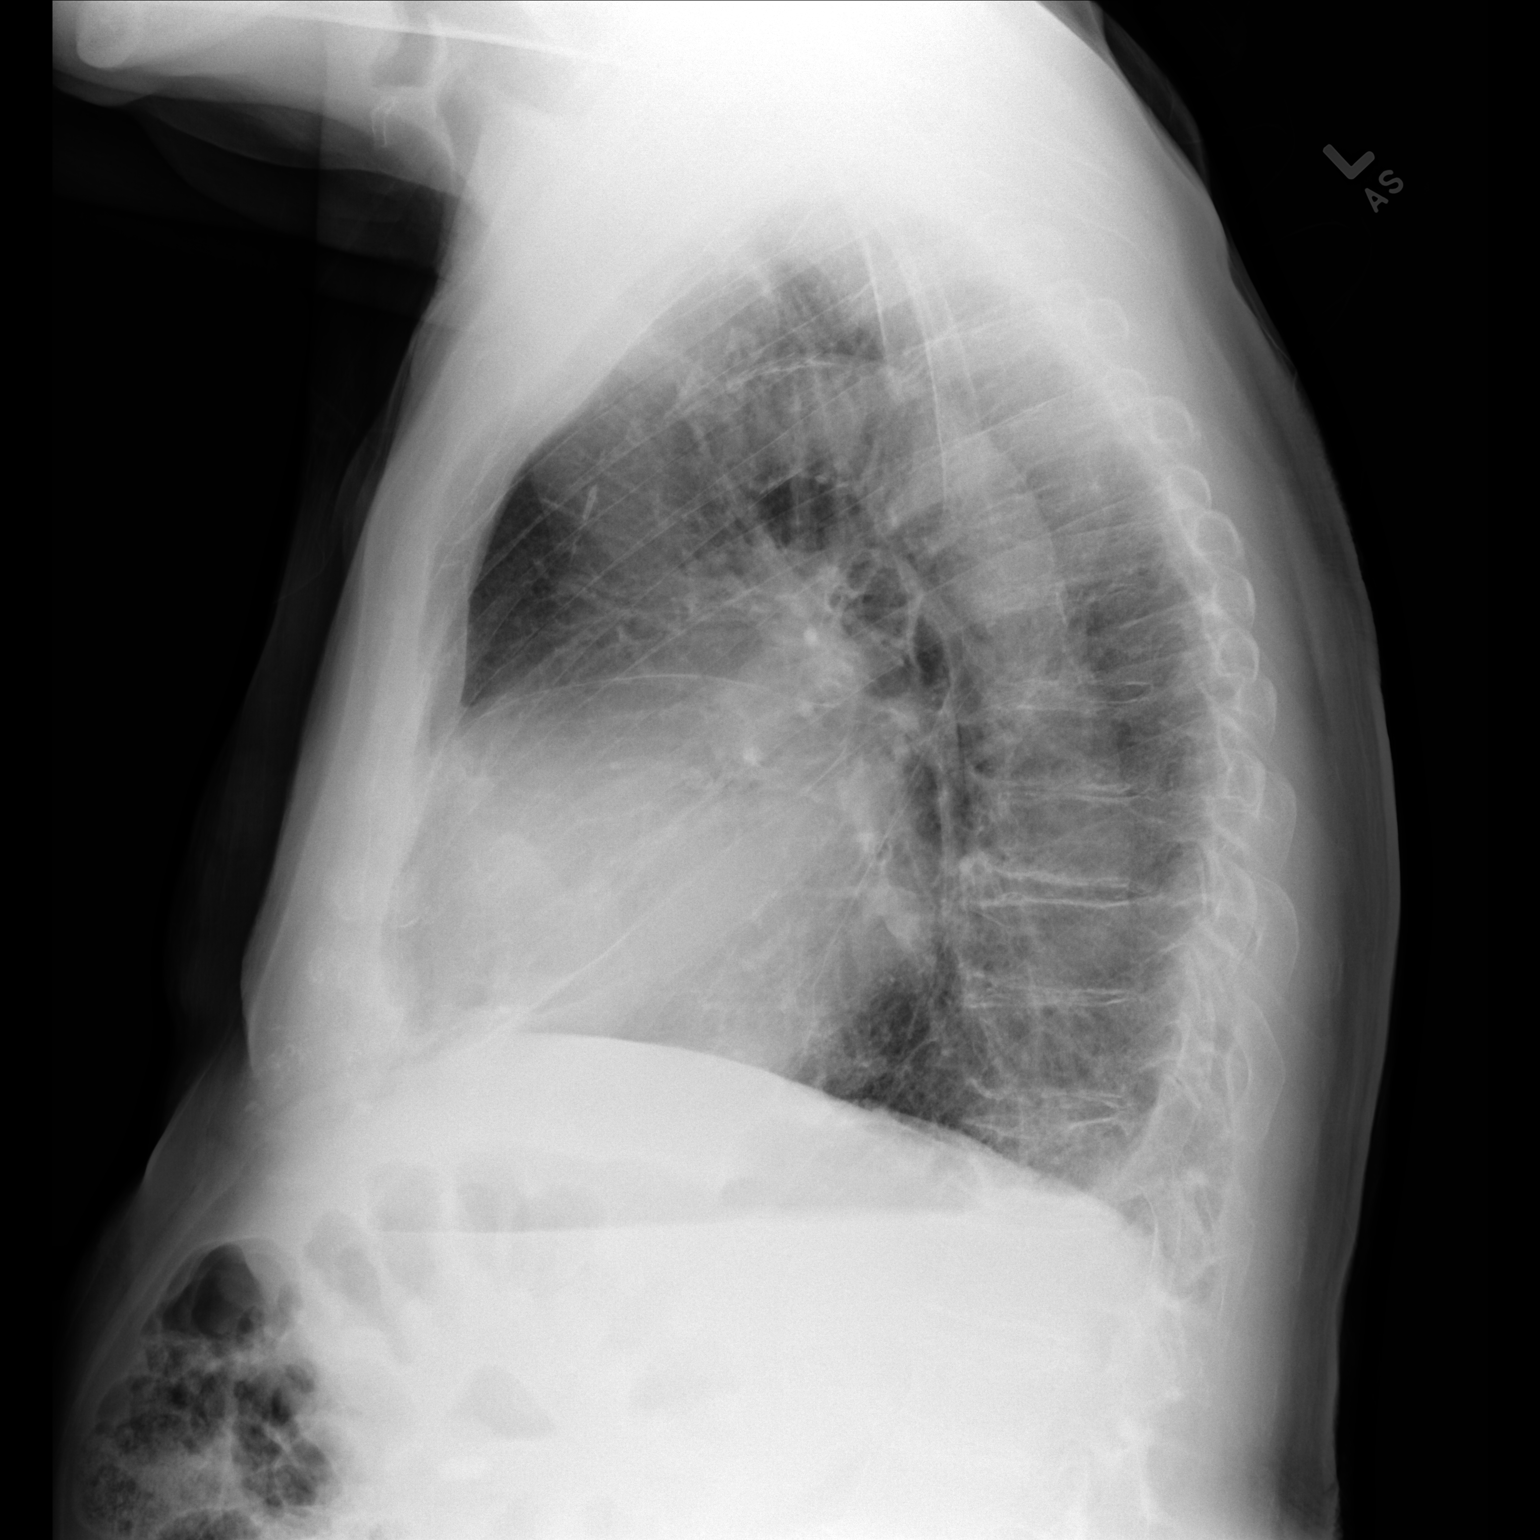

[2 of 2 positions shown; findings below may reference images not displayed]

FINDINGS: There is a somewhat ill-defined nodular opacity in the right base
region measuring 2.4 x 1.4 cm. There is scarring in the left base.
There is an equivocal minimal right pleural effusion. Lungs
elsewhere clear. Heart is mildly enlarged with pulmonary vascularity
normal. No adenopathy. There is aortic atherosclerosis. There is
degenerative change in the thoracic spine. There is anterior wedging
of a lower thoracic vertebral body, unchanged.
IMPRESSION: 1. Ill-defined opacity right base which is nodular in appearance.
Advise noncontrast enhanced chest CT to further evaluate.

2.  Mild scarring left base, stable.

3.  Probable minimal right pleural effusion.

4.  Cardiomegaly.  Aortic Atherosclerosis ([KC]-[KC]).

These results will be called to the ordering clinician or
representative by the Radiologist Assistant, and communication
documented in the PACS or [REDACTED].

## 2019-12-02 NOTE — Progress Notes (Signed)
Cardiology Office Note   Date:  12/03/2019   ID:  Adam Richards, DOB September 06, 1926, MRN [id number]  PCP:  Wenda Low, MD  Cardiologist:   Minus Breeding, MD   Chief Complaint  Patient presents with  . Shortness of Breath     History of Present Illness: Adam Richards is a 86 y.o. male who presents for evaluation of atrial fibrillation. He moved here from [place]. He has a long-standing history of permanent atrial fibrillation. He's been on anticoagulation for years.   Since I last saw him he had some increased abdominal and leg swelling.  He was put Lasix which he is currently taking 20 mg a day and an extra 20 mg as needed.  He has had some mild increase in baseline dyspnea with activities but is not describing overt new respiratory distress.  He is not having any PND or orthopnea.  He does not notice any palpitations, presyncope or syncope.   Past Medical History:  Diagnosis Date  . A-fib ([place])   . Hypertension     Past Surgical History:  Procedure Laterality Date  . KNEE SURGERY     "removed crystals"  . TONSILLECTOMY AND ADENOIDECTOMY    . UMBILICAL HERNIA REPAIR       Current Outpatient Medications  Medication Sig Dispense Refill  . amLODipine (NORVASC) 5 MG tablet Take 2.5 mg by mouth daily. Take 0.5 tablet daily    . docusate calcium (SURFAK) 240 MG capsule Take 240 mg by mouth daily.    Marland Kitchen dutasteride (AVODART) 0.5 MG capsule Take 1 capsule by mouth daily.  2  . furosemide (LASIX) 20 MG tablet Take 20 mg by mouth 2 (two) times daily.    Marland Kitchen lovastatin (MEVACOR) 20 MG tablet Take 20 mg by mouth daily.  1  . metoprolol tartrate (LOPRESSOR) 25 MG tablet Take 1 tablet by mouth 2 (two) times daily.    . potassium chloride (KLOR-CON) 10 MEQ tablet Take 10 mEq by mouth daily.    . tamsulosin (FLOMAX) 0.4 MG CAPS capsule Take 1 capsule by mouth daily.    Marland Kitchen warfarin (COUMADIN) 2 MG tablet Take 1 tablet (2 mg total) by mouth one time only at 6 PM. 30 tablet 0   No  current facility-administered medications for this visit.    Allergies:   Patient has no known allergies.    ROS:  Please see the history of present illness.   Otherwise, review of systems are positive for none.   All other systems are reviewed and negative.    PHYSICAL EXAM: VS:  BP (!) 158/90   Ht 5\' 10"  (1.778 m)   Wt 189 lb 3.2 oz (85.8 kg)   SpO2 97%   BMI 27.15 kg/m  , BMI Body mass index is 27.15 kg/m.  GENERAL:  Well appearing NECK:  No jugular venous distention, waveform within normal limits, carotid upstroke brisk and symmetric, no bruits, no thyromegaly LUNGS:  Clear to auscultation bilaterally CHEST:  Unremarkable HEART:  PMI not displaced or sustained,S1 and S2 within normal limits, no S3, no clicks, no rubs, no murmurs, irregular ABD:  Flat, positive bowel sounds normal in frequency in pitch, no bruits, no rebound, no guarding, no midline pulsatile mass, no hepatomegaly, no splenomegaly EXT:  2 plus pulses throughout, left greater than right leg edema, no cyanosis no clubbing  No change from previous.     EKG:  EKG is  ordered today. The ekg ordered today demonstrates atrial fibrillation, rate  88, premature ventricular contractions, poor anterior R-wave progression, low voltage in leads, intervals within normal limits, no acute ST-T wave changes.     Recent Labs: 05/14/2019: ALT 20 05/19/2019: BUN 16; Creatinine, Ser 0.84; Hemoglobin 10.8; Magnesium 1.9; Platelets 146; Potassium 4.2; Sodium 131    Lipid Panel No results found for: CHOL, TRIG, HDL, CHOLHDL, VLDL, LDLCALC, LDLDIRECT    Wt Readings from Last 3 Encounters:  12/03/19 189 lb 3.2 oz (85.8 kg)  06/07/19 186 lb (84.4 kg)  05/15/19 195 lb 1.7 oz (88.5 kg)      Other studies Reviewed: Additional studies/ records that were reviewed today include:  Labs Review of the above records demonstrates:  See elsewhere   ASSESSMENT AND PLAN:  ATRIAL FIB:  The patient has chronic atrial fibrillation. Adam Richards has a CHA2DS2 - VASc score of 3 .   He tolerates anticoagulation and rate control.  No change in therapy.  HTN: Blood pressure is slightly elevated.  He is going to keep a blood pressure diary and is getting a blood pressure cuff sent to him.  He was taken off of HCTZ triamterene in the past because of hyponatremia.  I do not see the most recent sodium but he might tolerate this in the future if he does not have good blood pressure control.  He does have some lower extremity swelling but he also might tolerate an increased dose of Norvasc if he needs it.   EDEMA: As above this could be related to his Norvasc but I do not think so as he also had some abdominal swelling.  I am going to check an echocardiogram to look for RV and LV function.  This will also help assess his mild dyspnea.  If this is normal I would not suggest further cardiac testing unless his shortness of breath gets worse.  We will see him back to discuss this.  COVID EDUCATION: He has had his vaccines  Current medicines are reviewed at length with the patient today.  The patient does not have concerns regarding medicines.  The following changes have been made:  None  Labs/ tests ordered today include:   Orders Placed This Encounter  Procedures  . EKG 12-Lead  . ECHOCARDIOGRAM COMPLETE    Disposition:   FU with me or APP in six  months  Signed, Minus Breeding, MD  12/03/2019 12:39 PM    [place]

## 2019-12-03 ENCOUNTER — Ambulatory Visit (INDEPENDENT_AMBULATORY_CARE_PROVIDER_SITE_OTHER): Payer: Medicare Other | Admitting: Cardiology

## 2019-12-03 ENCOUNTER — Other Ambulatory Visit: Payer: Self-pay | Admitting: Internal Medicine

## 2019-12-03 ENCOUNTER — Other Ambulatory Visit: Payer: Self-pay

## 2019-12-03 ENCOUNTER — Encounter: Payer: Self-pay | Admitting: Cardiology

## 2019-12-03 VITALS — BP 158/90 | Ht 70.0 in | Wt 189.2 lb

## 2019-12-03 DIAGNOSIS — R9389 Abnormal findings on diagnostic imaging of other specified body structures: Secondary | ICD-10-CM

## 2019-12-03 DIAGNOSIS — I10 Essential (primary) hypertension: Secondary | ICD-10-CM | POA: Diagnosis not present

## 2019-12-03 DIAGNOSIS — I482 Chronic atrial fibrillation, unspecified: Secondary | ICD-10-CM | POA: Diagnosis not present

## 2019-12-03 DIAGNOSIS — Z7189 Other specified counseling: Secondary | ICD-10-CM | POA: Diagnosis not present

## 2019-12-03 NOTE — Patient Instructions (Addendum)
Medication Instructions:  The current medical regimen is effective;  continue present plan and medications.  *If you need a refill on your cardiac medications before your next appointment, please call your pharmacy*  Testing:  Echocardiogram - Your physician has requested that you have an echocardiogram. Echocardiography is a painless test that uses sound waves to create images of your heart. It provides your doctor with information about the size and shape of your heart and how well your heart's chambers and valves are working. This procedure takes approximately one hour. There are no restrictions for this procedure. This will be performed at our [facility] location - [address].    Follow-Up: At [facility], you and your health needs are our priority.  As part of our continuing mission to provide you with exceptional heart care, we have created designated Provider Care Teams.  These Care Teams include your primary Cardiologist (physician) and Advanced Practice Providers (APPs -  Physician Assistants and Nurse Practitioners) who all work together to provide you with the care you need, when you need it.  We recommend signing up for the patient portal called "MyChart".  Sign up information is provided on this After Visit Summary.  MyChart is used to connect with patients for Virtual Visits (Telemedicine).  Patients are able to view lab/test results, encounter notes, upcoming appointments, etc.  Non-urgent messages can be sent to your provider as well.   To learn more about what you can do with MyChart, go to [email or web address].    Your next appointment:   6 month(s)  The format for your next appointment:   In Person  Provider:   You may see Minus Breeding, MD or one of the following Advanced Practice Providers on your designated Care Team:    Rosaria Ferries, PA-C  Jory [place], DNP, ANP  Cadence Kathlen Mody, [place]

## 2019-12-04 ENCOUNTER — Ambulatory Visit: Payer: Medicare Other | Admitting: Cardiology

## 2019-12-04 DIAGNOSIS — L905 Scar conditions and fibrosis of skin: Secondary | ICD-10-CM | POA: Diagnosis not present

## 2019-12-04 DIAGNOSIS — N400 Enlarged prostate without lower urinary tract symptoms: Secondary | ICD-10-CM | POA: Diagnosis not present

## 2019-12-04 DIAGNOSIS — E7800 Pure hypercholesterolemia, unspecified: Secondary | ICD-10-CM | POA: Diagnosis not present

## 2019-12-04 DIAGNOSIS — I4891 Unspecified atrial fibrillation: Secondary | ICD-10-CM | POA: Diagnosis not present

## 2019-12-04 DIAGNOSIS — I10 Essential (primary) hypertension: Secondary | ICD-10-CM | POA: Diagnosis not present

## 2019-12-04 DIAGNOSIS — D485 Neoplasm of uncertain behavior of skin: Secondary | ICD-10-CM | POA: Diagnosis not present

## 2019-12-05 DIAGNOSIS — Z7901 Long term (current) use of anticoagulants: Secondary | ICD-10-CM | POA: Diagnosis not present

## 2019-12-17 ENCOUNTER — Ambulatory Visit
Admission: RE | Admit: 2019-12-17 | Discharge: 2019-12-17 | Disposition: A | Discharge status: Home / Self Care | Payer: Medicare Other | Source: Ambulatory Visit | Attending: Internal Medicine | Admitting: Internal Medicine

## 2019-12-17 DIAGNOSIS — R9389 Abnormal findings on diagnostic imaging of other specified body structures: Secondary | ICD-10-CM

## 2019-12-17 DIAGNOSIS — I700 Atherosclerosis of aorta: Secondary | ICD-10-CM | POA: Diagnosis not present

## 2019-12-17 DIAGNOSIS — J90 Pleural effusion, not elsewhere classified: Secondary | ICD-10-CM | POA: Diagnosis not present

## 2019-12-17 DIAGNOSIS — I2510 Atherosclerotic heart disease of native coronary artery without angina pectoris: Secondary | ICD-10-CM | POA: Diagnosis not present

## 2019-12-17 DIAGNOSIS — N62 Hypertrophy of breast: Secondary | ICD-10-CM | POA: Diagnosis not present

## 2019-12-17 IMAGING — CT CT CHEST W/O CM
2 of 4 series · 15 of 36 positions shown, 18 images · non-contrast
Comparison: Chest x-ray [DATE].  CT [DATE]

CLINICAL DATA: Abnormal chest x-ray

EXAM:
CT CHEST WITHOUT CONTRAST
TECHNIQUE: Multidetector CT imaging of the chest was performed following the
standard protocol without IV contrast.

[Series 2: chest 2.00 br40 s3 · axial · 0.76mm/px · z∈[+1459,+1751]mm · 12 of 174 slices shown, 15 images (1 of 2)]
[im 14/174  mediastinal]
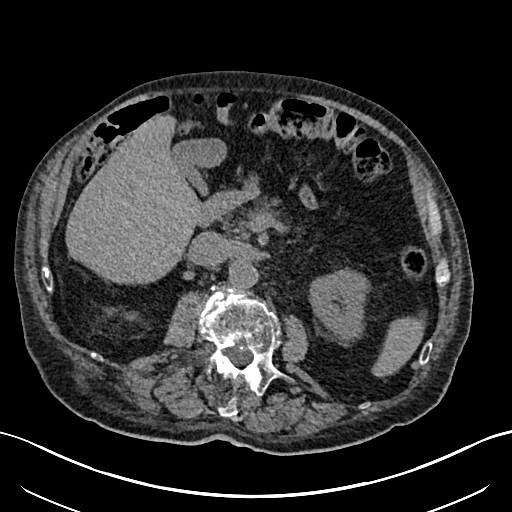
[im 14/174  lung]
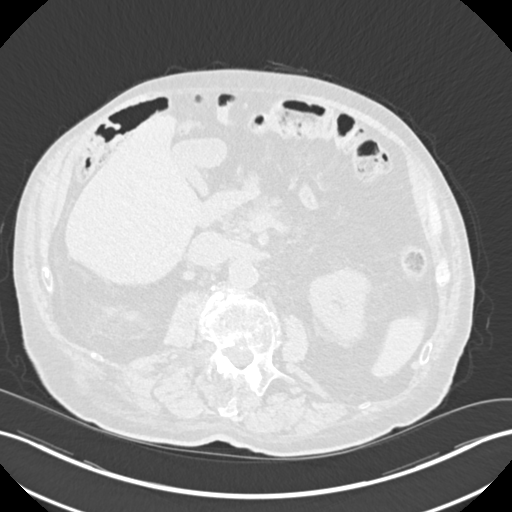
[im 27/174  lung]
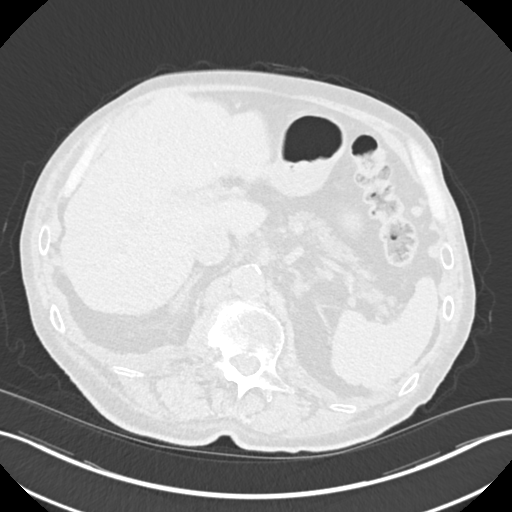
[im 40/174  lung]
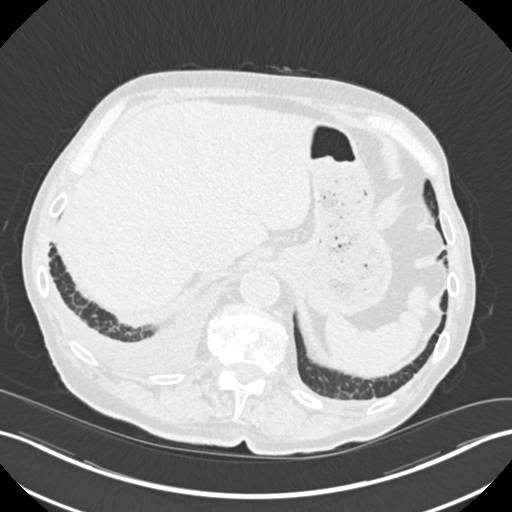
[im 54/174  lung]
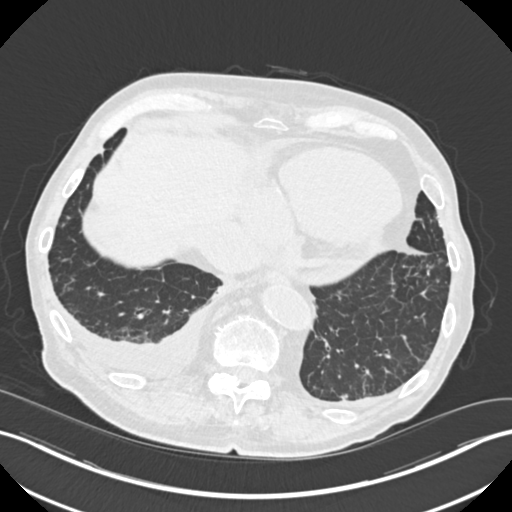
[im 67/174  mediastinal]
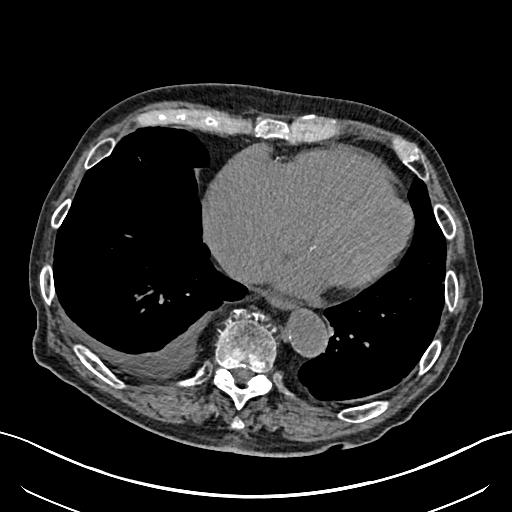
[im 67/174  lung]
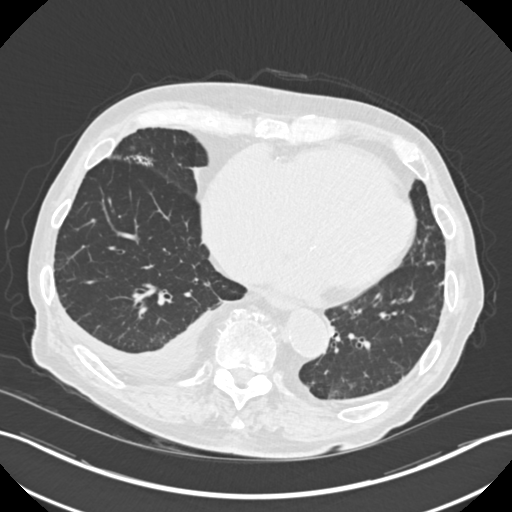
[im 80/174  lung]
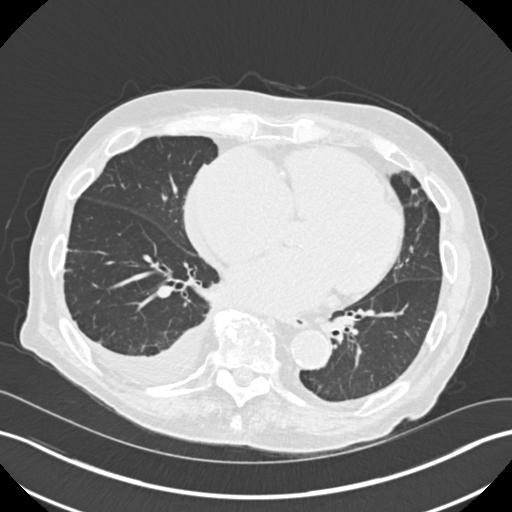
[im 94/174  lung]
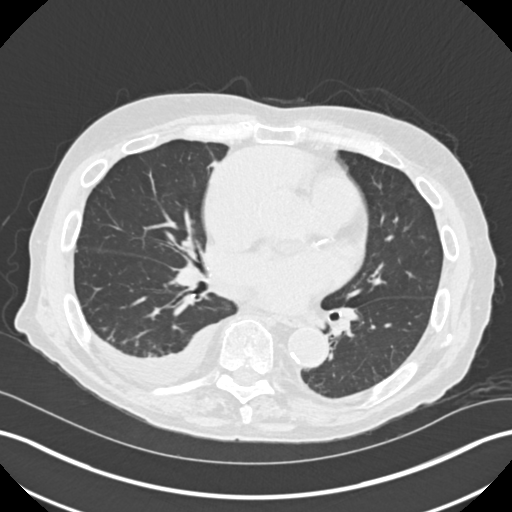
[im 107/174  lung]
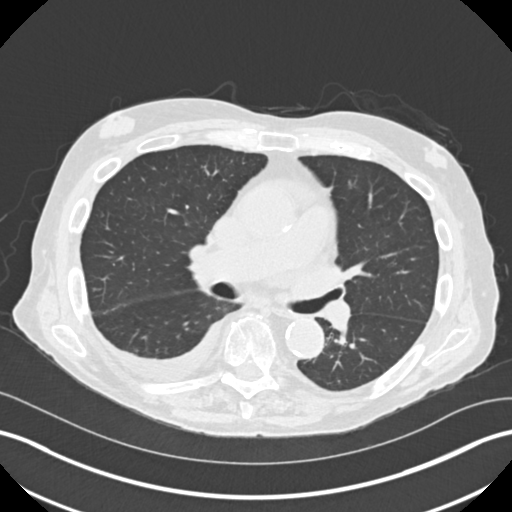
[im 120/174  mediastinal]
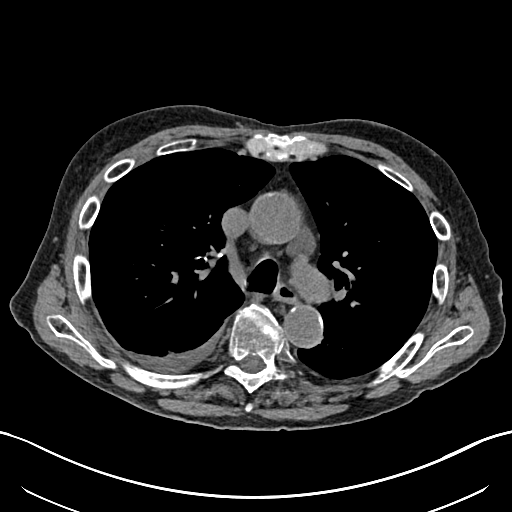
[im 120/174  lung]
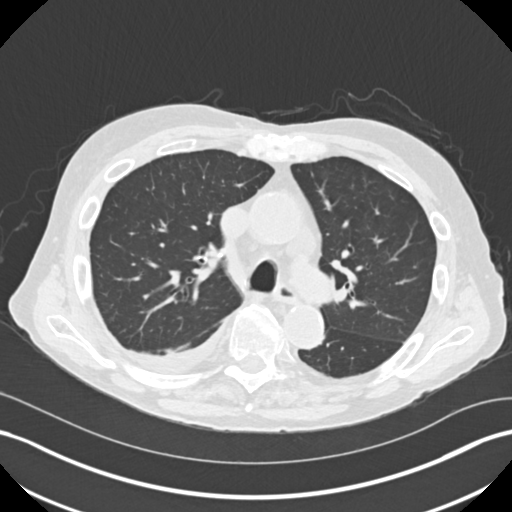
[im 134/174  lung]
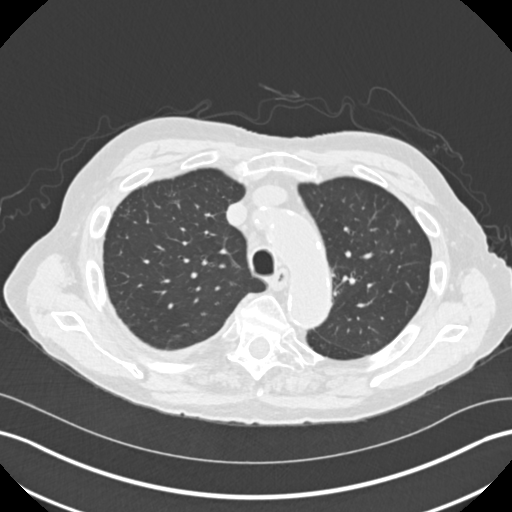
[im 147/174  lung]
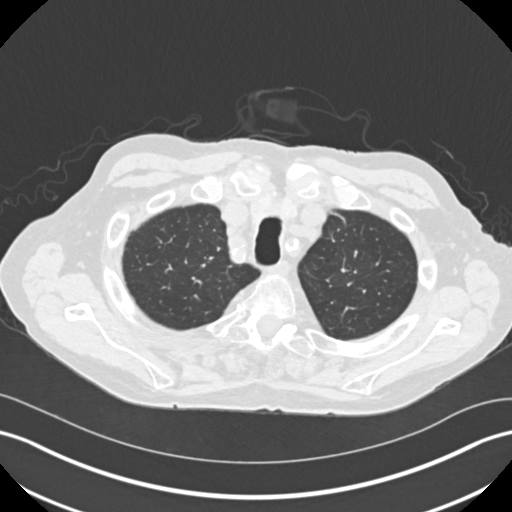
[im 160/174  lung]
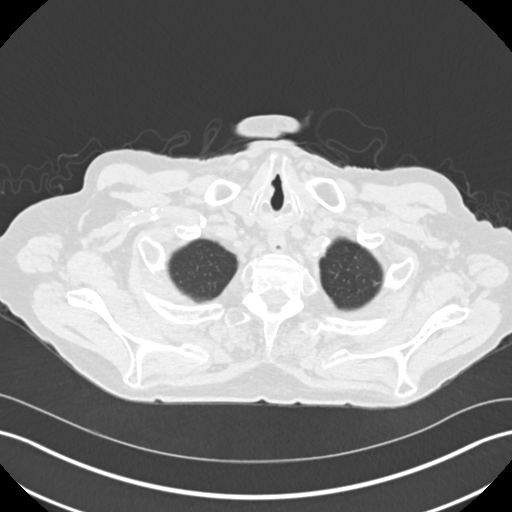

[Series 4: chest 2.00 br40 s3 · coronal · 0.68mm/px · 3 of 194 slices shown (2 of 2)]
[im 39/194  lung]
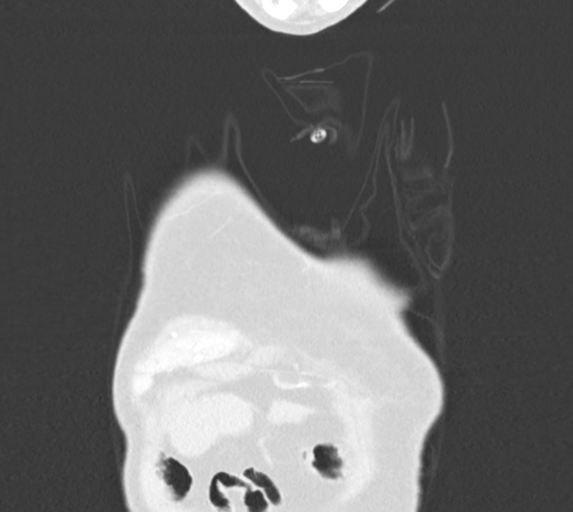
[im 78/194  lung]
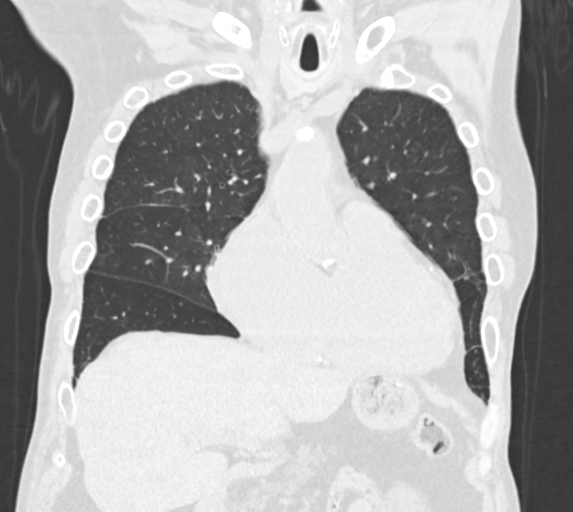
[im 116/194  lung]
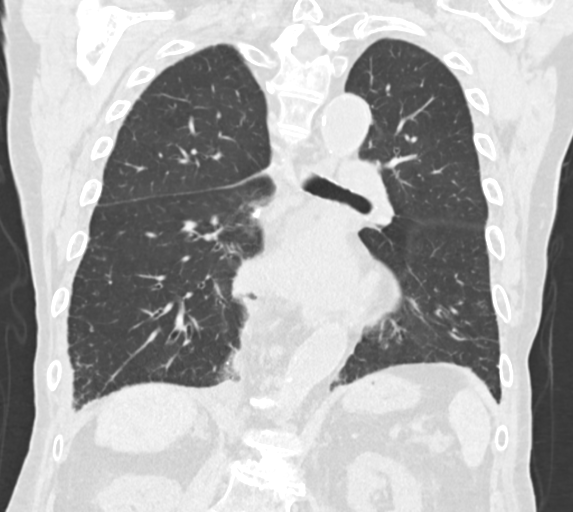

[15 of 36 positions shown; findings below may reference images not displayed]

FINDINGS: Cardiovascular: Cardiomegaly. Diffuse coronary artery and aortic
atherosclerosis. No evidence of aneurysm.

Mediastinum/Nodes: Subcarinal lymph nodes measure up to 17 mm in
short axis diameter. Small scattered AP window and right
paratracheal lymph nodes, not pathologically enlarged. No visible
hilar adenopathy on this noncontrast study. No axillary adenopathy.
Trachea and esophagus are unremarkable. Thyroid unremarkable.

Lungs/Pleura: Small right pleural effusion. Scarring in the lung
bases is stable since prior CT. No suspicious pulmonary nodule, in
particular at the right lung base.

Upper Abdomen: Imaging into the upper abdomen shows no acute
findings. Multiloculated cystic mass again seen within the left
kidney measuring up to 6.4 cm, stable.

Musculoskeletal: Bilateral gynecomastia, stable. No acute bony
abnormality.
IMPRESSION: No suspicious pulmonary nodules, in particular at the right lung
base as questioned on prior chest x-ray.

Small right pleural effusion.  Bibasilar scarring.

Cardiomegaly, coronary artery disease.

Bilateral gynecomastia.

Stable left renal multiloculated cystic lesion.

Aortic Atherosclerosis ([GT]-[GT]).

## 2019-12-24 ENCOUNTER — Ambulatory Visit (HOSPITAL_COMMUNITY): Payer: Medicare Other | Attending: Cardiology

## 2019-12-24 ENCOUNTER — Other Ambulatory Visit: Payer: Self-pay

## 2019-12-24 DIAGNOSIS — I482 Chronic atrial fibrillation, unspecified: Secondary | ICD-10-CM | POA: Diagnosis not present

## 2019-12-24 LAB — ECHOCARDIOGRAM COMPLETE
Area-P 1/2: 2.78 cm2
MV M vel: 3.53 m/s
MV Peak grad: 49.8 mmHg
S' Lateral: 2.8 cm

## 2019-12-25 ENCOUNTER — Telehealth: Payer: Self-pay

## 2019-12-25 NOTE — Telephone Encounter (Signed)
Called and spoke with pt, reviewed recommendations from [facility]. pt states he isn't having any increase in symptoms or at least not enough to bother him (notified symptoms are things like SOB and swelling) Pt okay staying at same dosage of lasix. Notified he could contact us if this changes. Able to schedule pt with Dr.Hochrein for 6 week follow up on 02/08/20 at 8:20am  Pt verbalized under standing with no other questions at this time.

## 2019-12-31 DIAGNOSIS — I10 Essential (primary) hypertension: Secondary | ICD-10-CM | POA: Diagnosis not present

## 2019-12-31 DIAGNOSIS — N400 Enlarged prostate without lower urinary tract symptoms: Secondary | ICD-10-CM | POA: Diagnosis not present

## 2019-12-31 DIAGNOSIS — E7800 Pure hypercholesterolemia, unspecified: Secondary | ICD-10-CM | POA: Diagnosis not present

## 2019-12-31 DIAGNOSIS — I4891 Unspecified atrial fibrillation: Secondary | ICD-10-CM | POA: Diagnosis not present

## 2020-01-02 DIAGNOSIS — Z7901 Long term (current) use of anticoagulants: Secondary | ICD-10-CM | POA: Diagnosis not present

## 2020-01-09 DIAGNOSIS — Z7901 Long term (current) use of anticoagulants: Secondary | ICD-10-CM | POA: Diagnosis not present

## 2020-02-06 DIAGNOSIS — I272 Pulmonary hypertension, unspecified: Secondary | ICD-10-CM | POA: Insufficient documentation

## 2020-02-06 DIAGNOSIS — M7989 Other specified soft tissue disorders: Secondary | ICD-10-CM | POA: Insufficient documentation

## 2020-02-06 DIAGNOSIS — Z7901 Long term (current) use of anticoagulants: Secondary | ICD-10-CM | POA: Diagnosis not present

## 2020-02-06 NOTE — Progress Notes (Signed)
Cardiology Office Note   Date:  02/07/2020   ID:  Adam Richards, DOB 08-Jul-1926, MRN [id number]  PCP:  Wenda Low, MD  Cardiologist:   Minus Breeding, MD    Chief Complaint  Patient presents with  . Leg Swelling     History of Present Illness: Adam Richards is a 86 y.o. male who presents for evaluation of atrial fibrillation. He moved here from [place]. He has a long-standing history of permanent atrial fibrillation. He's been on anticoagulation for years.   He had some lower extremity edema so I ordered an echo after the last visit.   He did have some mildly decreased LV function and moderately increased pulmonary pressures.   Since I last saw him he did at our instruction start taking an extra 20 mg of Lasix every day and he says that in retrospect he did have more swelling and he realized and he now feels better.  He seems to get around easier.  He has a little more energy.  He is not having any new shortness of breath, PND or orthopnea.  He does have lower extremity swelling but he thinks it is less problematic.   Past Medical History:  Diagnosis Date  . A-fib ([place])   . Hypertension     Past Surgical History:  Procedure Laterality Date  . KNEE SURGERY     "removed crystals"  . TONSILLECTOMY AND ADENOIDECTOMY    . UMBILICAL HERNIA REPAIR       Current Outpatient Medications  Medication Sig Dispense Refill  . amLODipine (NORVASC) 5 MG tablet Take 2.5 mg by mouth daily. Take 0.5 tablet daily    . docusate calcium (SURFAK) 240 MG capsule Take 240 mg by mouth daily.    Marland Kitchen dutasteride (AVODART) 0.5 MG capsule Take 1 capsule by mouth daily.  2  . furosemide (LASIX) 20 MG tablet Take 20 mg by mouth 2 (two) times daily.    Marland Kitchen lovastatin (MEVACOR) 20 MG tablet Take 20 mg by mouth daily.  1  . metoprolol tartrate (LOPRESSOR) 25 MG tablet Take 1 tablet by mouth 2 (two) times daily.    . potassium chloride (KLOR-CON) 10 MEQ tablet Take 10 mEq by mouth daily.    .  tamsulosin (FLOMAX) 0.4 MG CAPS capsule Take 1 capsule by mouth daily.    Marland Kitchen warfarin (COUMADIN) 2 MG tablet Take 1 tablet (2 mg total) by mouth one time only at 6 PM. 30 tablet 0   No current facility-administered medications for this visit.    Allergies:   Patient has no known allergies.    ROS:  Please see the history of present illness.   Otherwise, review of systems are positive for none.   All other systems are reviewed and negative.    PHYSICAL EXAM: VS:  BP 130/78   Pulse 78   Temp (!) 96.1 F (35.6 C)   Ht 5\' 10"  (1.778 m)   Wt 185 lb 9.6 oz (84.2 kg)   SpO2 98%   BMI 26.63 kg/m  , BMI Body mass index is 26.63 kg/m.  GENERAL:  Well appearing NECK:  No jugular venous distention, waveform within normal limits, carotid upstroke brisk and symmetric, no bruits, no thyromegaly LUNGS:  Clear to auscultation bilaterally CHEST:  Unremarkable HEART:  PMI not displaced or sustained,S1 and S2 within normal limits, no S3, no clicks, no rubs, soft systolic, no diastolic murmurs, irregular ABD:  Flat, positive bowel sounds normal in frequency in pitch, no  bruits, no rebound, no guarding, no midline pulsatile mass, no hepatomegaly, no splenomegaly EXT:  2 plus pulses throughout, moderate leg to mid calf edema, no cyanosis no clubbing   EKG:  EKG is  not ordered today.   Recent Labs: 05/14/2019: ALT 20 05/19/2019: BUN 16; Creatinine, Ser 0.84; Hemoglobin 10.8; Magnesium 1.9; Platelets 146; Potassium 4.2; Sodium 131    Lipid Panel No results found for: CHOL, TRIG, HDL, CHOLHDL, VLDL, LDLCALC, LDLDIRECT    Wt Readings from Last 3 Encounters:  02/07/20 185 lb 9.6 oz (84.2 kg)  12/03/19 189 lb 3.2 oz (85.8 kg)  06/07/19 186 lb (84.4 kg)      Other studies Reviewed: Additional studies/ records that were reviewed today include:  Labs Review of the above records demonstrates:  See elsewhere   ASSESSMENT AND PLAN:  ATRIAL FIB:  The patient has chronic atrial fibrillation. Mr.  Lorn Butcher has a CHA2DS2 - VASc score of 3 .   He tolerates anticoagulation with rate control.  No change in therapy.  HTN: Blood pressure is better controlled.  He takes it at home and it is well controlled.  He will continue the meds as listed.   PULMONARY HTN: We talked about this.  I will pursue conservative management and probably a follow-up echo before I see him in 6 months.  EDEMA: We are going to manage this conservatively.  I am going to check a basic metabolic profile today.  He will remain on the increased dose of diuretic.  We talked about compression stockings which she will consider.  Right now he is not uncomfortable but would let me know if he has increased edema, discomfort or other symptoms.   COVID EDUCATION: He has had his vaccines  Current medicines are reviewed at length with the patient today.  The patient does not have concerns regarding medicines.  The following changes have been made:  None  Labs/ tests ordered today include:   Orders Placed This Encounter  Procedures  . Basic metabolic panel  . ECHOCARDIOGRAM COMPLETE    Disposition:   FU with 6 months.   Signed, Minus Breeding, MD  02/07/2020 3:55 PM    [place]

## 2020-02-07 ENCOUNTER — Other Ambulatory Visit: Payer: Self-pay

## 2020-02-07 ENCOUNTER — Ambulatory Visit (INDEPENDENT_AMBULATORY_CARE_PROVIDER_SITE_OTHER): Payer: Medicare Other | Admitting: Cardiology

## 2020-02-07 ENCOUNTER — Encounter: Payer: Self-pay | Admitting: Cardiology

## 2020-02-07 VITALS — BP 130/78 | HR 78 | Temp 96.1°F | Ht 70.0 in | Wt 185.6 lb

## 2020-02-07 DIAGNOSIS — I10 Essential (primary) hypertension: Secondary | ICD-10-CM | POA: Diagnosis not present

## 2020-02-07 DIAGNOSIS — M7989 Other specified soft tissue disorders: Secondary | ICD-10-CM

## 2020-02-07 DIAGNOSIS — I482 Chronic atrial fibrillation, unspecified: Secondary | ICD-10-CM

## 2020-02-07 DIAGNOSIS — Z7189 Other specified counseling: Secondary | ICD-10-CM | POA: Diagnosis not present

## 2020-02-07 DIAGNOSIS — I272 Pulmonary hypertension, unspecified: Secondary | ICD-10-CM

## 2020-02-07 DIAGNOSIS — Z20822 Contact with and (suspected) exposure to covid-19: Secondary | ICD-10-CM | POA: Diagnosis not present

## 2020-02-07 NOTE — Patient Instructions (Signed)
Medication Instructions:  Your physician recommends that you continue on your current medications as directed. Please refer to the Current Medication list given to you today.  *If you need a refill on your cardiac medications before your next appointment, please call your pharmacy*   Lab Work: BMET today  If you have labs (blood work) drawn today and your tests are completely normal, you will receive your results only by: [facility] MyChart Message (if you have MyChart) OR . A paper copy in the mail If you have any lab test that is abnormal or we need to change your treatment, we will call you to review the results.  Testing: Your physician has requested that you have an echocardiogram in 6 MONTHS. Echocardiography is a painless test that uses sound waves to create images of your heart. It provides your doctor with information about the size and shape of your heart and how well your heart's chambers and valves are working. This procedure takes approximately one hour. There are no restrictions for this procedure.  Follow-Up: At [facility], you and your health needs are our priority.  As part of our continuing mission to provide you with exceptional heart care, we have created designated Provider Care Teams.  These Care Teams include your primary Cardiologist (physician) and Advanced Practice Providers (APPs -  Physician Assistants and Nurse Practitioners) who all work together to provide you with the care you need, when you need it.  We recommend signing up for the patient portal called "MyChart".  Sign up information is provided on this After Visit Summary.  MyChart is used to connect with patients for Virtual Visits (Telemedicine).  Patients are able to view lab/test results, encounter notes, upcoming appointments, etc.  Non-urgent messages can be sent to your provider as well.   To learn more about what you can do with MyChart, go to [email or web address].    Your next appointment:   6  month(s) (after echo completed)  The format for your next appointment:   In Person  Provider:   Minus Breeding, MD

## 2020-02-08 ENCOUNTER — Ambulatory Visit: Payer: Medicare Other | Admitting: Cardiology

## 2020-02-08 LAB — BASIC METABOLIC PANEL
BUN/Creatinine Ratio: 24 (ref 10–24)
BUN: 20 mg/dL (ref 10–36)
CO2: 25 mmol/L (ref 20–29)
Calcium: 8.9 mg/dL (ref 8.6–10.2)
Chloride: 101 mmol/L (ref 96–106)
Creatinine, Ser: 0.85 mg/dL (ref 0.76–1.27)
GFR calc Af Amer: 88 mL/min/{1.73_m2} (ref >59)
GFR calc non Af Amer: 76 mL/min/{1.73_m2} (ref >59)
Glucose: 108 mg/dL — ABNORMAL HIGH (ref 65–99)
Potassium: 4.3 mmol/L (ref 3.5–5.2)
Sodium: 140 mmol/L (ref 134–144)

## 2020-02-15 DIAGNOSIS — I4891 Unspecified atrial fibrillation: Secondary | ICD-10-CM | POA: Diagnosis not present

## 2020-02-15 DIAGNOSIS — E7800 Pure hypercholesterolemia, unspecified: Secondary | ICD-10-CM | POA: Diagnosis not present

## 2020-02-15 DIAGNOSIS — I10 Essential (primary) hypertension: Secondary | ICD-10-CM | POA: Diagnosis not present

## 2020-02-15 DIAGNOSIS — N400 Enlarged prostate without lower urinary tract symptoms: Secondary | ICD-10-CM | POA: Diagnosis not present

## 2020-03-03 DIAGNOSIS — D225 Melanocytic nevi of trunk: Secondary | ICD-10-CM | POA: Diagnosis not present

## 2020-03-03 DIAGNOSIS — L821 Other seborrheic keratosis: Secondary | ICD-10-CM | POA: Diagnosis not present

## 2020-03-03 DIAGNOSIS — Z85828 Personal history of other malignant neoplasm of skin: Secondary | ICD-10-CM | POA: Diagnosis not present

## 2020-03-03 DIAGNOSIS — L905 Scar conditions and fibrosis of skin: Secondary | ICD-10-CM | POA: Diagnosis not present

## 2020-03-03 DIAGNOSIS — L814 Other melanin hyperpigmentation: Secondary | ICD-10-CM | POA: Diagnosis not present

## 2020-03-03 DIAGNOSIS — D1801 Hemangioma of skin and subcutaneous tissue: Secondary | ICD-10-CM | POA: Diagnosis not present

## 2020-03-03 DIAGNOSIS — L570 Actinic keratosis: Secondary | ICD-10-CM | POA: Diagnosis not present

## 2020-03-05 DIAGNOSIS — Z7901 Long term (current) use of anticoagulants: Secondary | ICD-10-CM | POA: Diagnosis not present

## 2020-03-11 DIAGNOSIS — I270 Primary pulmonary hypertension: Secondary | ICD-10-CM | POA: Diagnosis not present

## 2020-03-11 DIAGNOSIS — I10 Essential (primary) hypertension: Secondary | ICD-10-CM | POA: Diagnosis not present

## 2020-03-11 DIAGNOSIS — R600 Localized edema: Secondary | ICD-10-CM | POA: Diagnosis not present

## 2020-03-11 DIAGNOSIS — Z23 Encounter for immunization: Secondary | ICD-10-CM | POA: Diagnosis not present

## 2020-03-11 DIAGNOSIS — I700 Atherosclerosis of aorta: Secondary | ICD-10-CM | POA: Diagnosis not present

## 2020-03-11 DIAGNOSIS — I4891 Unspecified atrial fibrillation: Secondary | ICD-10-CM | POA: Diagnosis not present

## 2020-03-11 DIAGNOSIS — E7800 Pure hypercholesterolemia, unspecified: Secondary | ICD-10-CM | POA: Diagnosis not present

## 2020-03-11 DIAGNOSIS — D6869 Other thrombophilia: Secondary | ICD-10-CM | POA: Diagnosis not present

## 2020-03-11 DIAGNOSIS — N400 Enlarged prostate without lower urinary tract symptoms: Secondary | ICD-10-CM | POA: Diagnosis not present

## 2020-04-02 DIAGNOSIS — Z7901 Long term (current) use of anticoagulants: Secondary | ICD-10-CM | POA: Diagnosis not present

## 2020-04-06 DIAGNOSIS — N400 Enlarged prostate without lower urinary tract symptoms: Secondary | ICD-10-CM | POA: Diagnosis not present

## 2020-04-06 DIAGNOSIS — I270 Primary pulmonary hypertension: Secondary | ICD-10-CM | POA: Diagnosis not present

## 2020-04-06 DIAGNOSIS — I10 Essential (primary) hypertension: Secondary | ICD-10-CM | POA: Diagnosis not present

## 2020-04-06 DIAGNOSIS — E7800 Pure hypercholesterolemia, unspecified: Secondary | ICD-10-CM | POA: Diagnosis not present

## 2020-04-06 DIAGNOSIS — I4891 Unspecified atrial fibrillation: Secondary | ICD-10-CM | POA: Diagnosis not present

## 2020-04-09 DIAGNOSIS — Z7901 Long term (current) use of anticoagulants: Secondary | ICD-10-CM | POA: Diagnosis not present

## 2020-04-29 DIAGNOSIS — I10 Essential (primary) hypertension: Secondary | ICD-10-CM | POA: Diagnosis not present

## 2020-04-29 DIAGNOSIS — I4891 Unspecified atrial fibrillation: Secondary | ICD-10-CM | POA: Diagnosis not present

## 2020-04-29 DIAGNOSIS — I270 Primary pulmonary hypertension: Secondary | ICD-10-CM | POA: Diagnosis not present

## 2020-04-29 DIAGNOSIS — N400 Enlarged prostate without lower urinary tract symptoms: Secondary | ICD-10-CM | POA: Diagnosis not present

## 2020-04-29 DIAGNOSIS — E7800 Pure hypercholesterolemia, unspecified: Secondary | ICD-10-CM | POA: Diagnosis not present

## 2020-05-07 DIAGNOSIS — Z7901 Long term (current) use of anticoagulants: Secondary | ICD-10-CM | POA: Diagnosis not present

## 2020-06-02 DIAGNOSIS — D1801 Hemangioma of skin and subcutaneous tissue: Secondary | ICD-10-CM | POA: Diagnosis not present

## 2020-06-02 DIAGNOSIS — D225 Melanocytic nevi of trunk: Secondary | ICD-10-CM | POA: Diagnosis not present

## 2020-06-02 DIAGNOSIS — L578 Other skin changes due to chronic exposure to nonionizing radiation: Secondary | ICD-10-CM | POA: Diagnosis not present

## 2020-06-02 DIAGNOSIS — C44629 Squamous cell carcinoma of skin of left upper limb, including shoulder: Secondary | ICD-10-CM | POA: Diagnosis not present

## 2020-06-02 DIAGNOSIS — D485 Neoplasm of uncertain behavior of skin: Secondary | ICD-10-CM | POA: Diagnosis not present

## 2020-06-02 DIAGNOSIS — C4441 Basal cell carcinoma of skin of scalp and neck: Secondary | ICD-10-CM | POA: Diagnosis not present

## 2020-06-02 DIAGNOSIS — L570 Actinic keratosis: Secondary | ICD-10-CM | POA: Diagnosis not present

## 2020-06-06 DIAGNOSIS — Z7901 Long term (current) use of anticoagulants: Secondary | ICD-10-CM | POA: Diagnosis not present

## 2020-06-20 DIAGNOSIS — Z7901 Long term (current) use of anticoagulants: Secondary | ICD-10-CM | POA: Diagnosis not present

## 2020-07-04 DIAGNOSIS — I4891 Unspecified atrial fibrillation: Secondary | ICD-10-CM | POA: Diagnosis not present

## 2020-07-04 DIAGNOSIS — E7800 Pure hypercholesterolemia, unspecified: Secondary | ICD-10-CM | POA: Diagnosis not present

## 2020-07-04 DIAGNOSIS — N400 Enlarged prostate without lower urinary tract symptoms: Secondary | ICD-10-CM | POA: Diagnosis not present

## 2020-07-04 DIAGNOSIS — I270 Primary pulmonary hypertension: Secondary | ICD-10-CM | POA: Diagnosis not present

## 2020-07-04 DIAGNOSIS — I10 Essential (primary) hypertension: Secondary | ICD-10-CM | POA: Diagnosis not present

## 2020-07-08 DIAGNOSIS — C44629 Squamous cell carcinoma of skin of left upper limb, including shoulder: Secondary | ICD-10-CM | POA: Diagnosis not present

## 2020-07-08 DIAGNOSIS — C44212 Basal cell carcinoma of skin of right ear and external auricular canal: Secondary | ICD-10-CM | POA: Diagnosis not present

## 2020-07-11 DIAGNOSIS — M436 Torticollis: Secondary | ICD-10-CM | POA: Diagnosis not present

## 2020-07-21 DIAGNOSIS — Z7901 Long term (current) use of anticoagulants: Secondary | ICD-10-CM | POA: Diagnosis not present

## 2020-07-22 DIAGNOSIS — C44212 Basal cell carcinoma of skin of right ear and external auricular canal: Secondary | ICD-10-CM | POA: Diagnosis not present

## 2020-08-06 ENCOUNTER — Other Ambulatory Visit: Payer: Self-pay

## 2020-08-06 ENCOUNTER — Ambulatory Visit (HOSPITAL_COMMUNITY): Payer: Medicare Other | Attending: Cardiology

## 2020-08-06 DIAGNOSIS — I482 Chronic atrial fibrillation, unspecified: Secondary | ICD-10-CM | POA: Insufficient documentation

## 2020-08-06 DIAGNOSIS — I272 Pulmonary hypertension, unspecified: Secondary | ICD-10-CM | POA: Diagnosis not present

## 2020-08-06 LAB — ECHOCARDIOGRAM COMPLETE
Area-P 1/2: 3.24 cm2
S' Lateral: 3.2 cm

## 2020-08-08 DIAGNOSIS — R2689 Other abnormalities of gait and mobility: Secondary | ICD-10-CM | POA: Diagnosis not present

## 2020-08-08 DIAGNOSIS — M6281 Muscle weakness (generalized): Secondary | ICD-10-CM | POA: Diagnosis not present

## 2020-08-08 DIAGNOSIS — M542 Cervicalgia: Secondary | ICD-10-CM | POA: Diagnosis not present

## 2020-08-11 NOTE — Progress Notes (Signed)
Cardiology Office Note   Date:  08/12/2020   ID:  Adam Richards, DOB March 26, 1927, MRN [id number]  PCP:  Wenda Low, MD  Cardiologist:   Minus Breeding, MD    Chief Complaint  Patient presents with  . Atrial Fibrillation  . Edema     History of Present Illness: Adam Richards is a 87 y.o. male who presents for evaluation of atrial fibrillation. He moved here from [place]. He has a long-standing history of permanent atrial fibrillation. He's been on anticoagulation for years. He had a mildly decreased LV function and moderately increased pulmonary pressures.   Since I last saw him he has done okay.  I did repeat an echocardiogram which suggested that his EF was low normal 50 to 55%.  He had moderately severe tricuspid regurgitation with moderately elevated pulmonary pressures.  He denies any other cardiovascular symptoms other than some mild dyspnea with exertion.  He does have chronic lower extremity left greater than right swelling.  He has been taking some increased Lasix having increased from 20 to 40 mg and he is not sure that this makes a big difference with his edema although before he was taking Lasix at all he had increased edema.  He is staying active.  He tries to watch salt.   Past Medical History:  Diagnosis Date  . A-fib ([place])   . Hypertension     Past Surgical History:  Procedure Laterality Date  . KNEE SURGERY     "removed crystals"  . TONSILLECTOMY AND ADENOIDECTOMY    . UMBILICAL HERNIA REPAIR       Current Outpatient Medications  Medication Sig Dispense Refill  . amLODipine (NORVASC) 5 MG tablet Take 2.5 mg by mouth daily. Take 0.5 tablet daily    . docusate calcium (SURFAK) 240 MG capsule Take 240 mg by mouth daily.    Marland Kitchen dutasteride (AVODART) 0.5 MG capsule Take 1 capsule by mouth daily.  2  . furosemide (LASIX) 20 MG tablet Take 20 mg by mouth 2 (two) times daily.    Marland Kitchen lovastatin (MEVACOR) 20 MG tablet Take 20 mg by mouth daily.  1  . metoprolol  tartrate (LOPRESSOR) 25 MG tablet Take 1 tablet by mouth 2 (two) times daily.    . potassium chloride (KLOR-CON) 10 MEQ tablet Take 10 mEq by mouth daily.    . tamsulosin (FLOMAX) 0.4 MG CAPS capsule Take 1 capsule by mouth daily.    Marland Kitchen warfarin (COUMADIN) 2 MG tablet Take 1 tablet (2 mg total) by mouth one time only at 6 PM. 30 tablet 0   No current facility-administered medications for this visit.    Allergies:   Patient has no known allergies.    ROS:  Please see the history of present illness.   Otherwise, review of systems are positive for none.   All other systems are reviewed and negative.    PHYSICAL EXAM: VS:  BP 122/78   Pulse 81   Ht 5\' 10"  (1.778 m)   Wt 193 lb 6.4 oz (87.7 kg)   SpO2 97%   BMI 27.75 kg/m  , BMI Body mass index is 27.75 kg/m.  GENERAL:  Well appearing NECK: Positive jugular venous distention, waveform within normal limits, carotid upstroke brisk and symmetric, no bruits, no thyromegaly LUNGS:  Clear to auscultation bilaterally CHEST:  Unremarkable HEART:  PMI not displaced or sustained,S1 and S2 within normal limits, no S3,  no clicks, no rubs, soft holosystolic third left intercostal space murmur,  no diastolic murmurs, irregular ABD:  Flat, positive bowel sounds normal in frequency in pitch, no bruits, no rebound, no guarding, no midline pulsatile mass, no hepatomegaly, no splenomegaly EXT:  2 plus pulses throughout, left greater than right edema, no cyanosis no clubbing  EKG:  EKG is not ordered today.   Recent Labs: 02/07/2020: BUN 20; Creatinine, Ser 0.85; Potassium 4.3; Sodium 140    Lipid Panel No results found for: CHOL, TRIG, HDL, CHOLHDL, VLDL, LDLCALC, LDLDIRECT    Wt Readings from Last 3 Encounters:  08/12/20 193 lb 6.4 oz (87.7 kg)  02/07/20 185 lb 9.6 oz (84.2 kg)  12/03/19 189 lb 3.2 oz (85.8 kg)      Other studies Reviewed: Additional studies/ records that were reviewed today include:  echo Review of the above records  demonstrates: See elsewhere   ASSESSMENT AND PLAN:  ATRIAL FIB:  The patient has chronic atrial fibrillation. Adam Richards has a CHA2DS2 - VASc score of 3 .  He does not want to change to DOAC.  He has good rate control and tolerates anticoagulation.   HTN: Blood pressure is controlled.  No change in therapy.   PULMONARY HTN:   We had a long discussion about this and he wants conservative management as I have suggested.  He will continue with diuretics.  No other change in therapy.   EDEMA:   His leg swelling is chronic and we talked about conservative therapies to include meds as listed plus compression stockings.     Current medicines are reviewed at length with the patient today.  The patient does not have concerns regarding medicines.  The following changes have been made: None  Labs/ tests ordered today include: None  Orders Placed This Encounter  Procedures  . Basic metabolic panel  . CBC    Disposition:   FU with 12 months.   Signed, Minus Breeding, MD  08/12/2020 3:22 PM    [facility]

## 2020-08-12 ENCOUNTER — Ambulatory Visit (INDEPENDENT_AMBULATORY_CARE_PROVIDER_SITE_OTHER): Payer: Medicare Other | Admitting: Cardiology

## 2020-08-12 ENCOUNTER — Other Ambulatory Visit: Payer: Self-pay

## 2020-08-12 ENCOUNTER — Encounter: Payer: Self-pay | Admitting: Cardiology

## 2020-08-12 VITALS — BP 122/78 | HR 81 | Ht 70.0 in | Wt 193.4 lb

## 2020-08-12 DIAGNOSIS — Z79899 Other long term (current) drug therapy: Secondary | ICD-10-CM | POA: Diagnosis not present

## 2020-08-12 DIAGNOSIS — I10 Essential (primary) hypertension: Secondary | ICD-10-CM | POA: Diagnosis not present

## 2020-08-12 DIAGNOSIS — M7989 Other specified soft tissue disorders: Secondary | ICD-10-CM | POA: Diagnosis not present

## 2020-08-12 DIAGNOSIS — I482 Chronic atrial fibrillation, unspecified: Secondary | ICD-10-CM

## 2020-08-12 DIAGNOSIS — I272 Pulmonary hypertension, unspecified: Secondary | ICD-10-CM

## 2020-08-12 NOTE — Patient Instructions (Signed)
Medication Instructions:  The current medical regimen is effective;  continue present plan and medications as directed. Please refer to the Current Medication list given to you today.  *If you need a refill on your cardiac medications before your next appointment, please call your pharmacy*  Lab Work: BMET AND CBC TODAY If you have labs (blood work) drawn today and your tests are completely normal, you will receive your results only by:  [place] (if you have MyChart) OR A paper copy in the mail.  If you have any lab test that is abnormal or we need to change your treatment, we will call you to review the results. You may go to any [facility] that is convenient for you however, we do have a lab in our office that is able to assist you. You DO NOT need an appointment for our lab. The lab is open 8:00am and closes at 4:00pm. Lunch 12:45 - 1:45pm.  Follow-Up: Your next appointment:  12 month(s) In Person with You may see Minus Breeding, MD or one of the following Advanced Practice Providers on your designated Care Team:  Rosaria Ferries, PA-C  Jory Sims, DNP, ANP  Please call our office 2 months in advance to schedule this appointment   At [facility], you and your health needs are our priority.  As part of our continuing mission to provide you with exceptional heart care, we have created designated Provider Care Teams.  These Care Teams include your primary Cardiologist (physician) and Advanced Practice Providers (APPs -  Physician Assistants and Nurse Practitioners) who all work together to provide you with the care you need, when you need it.  We recommend signing up for the patient portal called "MyChart".  Sign up information is provided on this After Visit Summary.  MyChart is used to connect with patients for Virtual Visits (Telemedicine).  Patients are able to view lab/test results, encounter notes, upcoming appointments, etc.  Non-urgent messages can be sent to your provider as  well.   To learn more about what you can do with MyChart, go to [email or web address].

## 2020-08-13 LAB — CBC
Hematocrit: 36.8 % — ABNORMAL LOW (ref 37.5–51.0)
Hemoglobin: 12.4 g/dL — ABNORMAL LOW (ref 13.0–17.7)
MCH: 33.1 pg — ABNORMAL HIGH (ref 26.6–33.0)
MCHC: 33.7 g/dL (ref 31.5–35.7)
MCV: 98 fL — ABNORMAL HIGH (ref 79–97)
Platelets: 227 10*3/uL (ref 150–450)
RBC: 3.75 x10E6/uL — ABNORMAL LOW (ref 4.14–5.80)
RDW: 13.2 % (ref 11.6–15.4)
WBC: 7.8 10*3/uL (ref 3.4–10.8)

## 2020-08-13 LAB — BASIC METABOLIC PANEL
BUN/Creatinine Ratio: 18 (ref 10–24)
BUN: 18 mg/dL (ref 10–36)
CO2: 25 mmol/L (ref 20–29)
Calcium: 9.1 mg/dL (ref 8.6–10.2)
Chloride: 94 mmol/L — ABNORMAL LOW (ref 96–106)
Creatinine, Ser: 0.99 mg/dL (ref 0.76–1.27)
Glucose: 77 mg/dL (ref 65–99)
Potassium: 4.1 mmol/L (ref 3.5–5.2)
Sodium: 134 mmol/L (ref 134–144)
eGFR: 72 mL/min/{1.73_m2} (ref >59)

## 2020-08-18 DIAGNOSIS — Z7901 Long term (current) use of anticoagulants: Secondary | ICD-10-CM | POA: Diagnosis not present

## 2020-08-18 DIAGNOSIS — I4891 Unspecified atrial fibrillation: Secondary | ICD-10-CM | POA: Diagnosis not present

## 2020-08-26 DIAGNOSIS — I4891 Unspecified atrial fibrillation: Secondary | ICD-10-CM | POA: Diagnosis not present

## 2020-08-26 DIAGNOSIS — L570 Actinic keratosis: Secondary | ICD-10-CM | POA: Diagnosis not present

## 2020-08-26 DIAGNOSIS — I10 Essential (primary) hypertension: Secondary | ICD-10-CM | POA: Diagnosis not present

## 2020-08-26 DIAGNOSIS — L905 Scar conditions and fibrosis of skin: Secondary | ICD-10-CM | POA: Diagnosis not present

## 2020-08-26 DIAGNOSIS — N400 Enlarged prostate without lower urinary tract symptoms: Secondary | ICD-10-CM | POA: Diagnosis not present

## 2020-08-26 DIAGNOSIS — E7800 Pure hypercholesterolemia, unspecified: Secondary | ICD-10-CM | POA: Diagnosis not present

## 2020-08-26 DIAGNOSIS — L309 Dermatitis, unspecified: Secondary | ICD-10-CM | POA: Diagnosis not present

## 2020-08-26 DIAGNOSIS — Z85828 Personal history of other malignant neoplasm of skin: Secondary | ICD-10-CM | POA: Diagnosis not present

## 2020-08-26 DIAGNOSIS — D1801 Hemangioma of skin and subcutaneous tissue: Secondary | ICD-10-CM | POA: Diagnosis not present

## 2020-08-26 DIAGNOSIS — I270 Primary pulmonary hypertension: Secondary | ICD-10-CM | POA: Diagnosis not present

## 2020-08-28 DIAGNOSIS — M542 Cervicalgia: Secondary | ICD-10-CM | POA: Diagnosis not present

## 2020-08-28 DIAGNOSIS — R2689 Other abnormalities of gait and mobility: Secondary | ICD-10-CM | POA: Diagnosis not present

## 2020-08-28 DIAGNOSIS — M6281 Muscle weakness (generalized): Secondary | ICD-10-CM | POA: Diagnosis not present

## 2020-09-01 DIAGNOSIS — M542 Cervicalgia: Secondary | ICD-10-CM | POA: Diagnosis not present

## 2020-09-01 DIAGNOSIS — R2689 Other abnormalities of gait and mobility: Secondary | ICD-10-CM | POA: Diagnosis not present

## 2020-09-01 DIAGNOSIS — M6281 Muscle weakness (generalized): Secondary | ICD-10-CM | POA: Diagnosis not present

## 2020-09-15 DIAGNOSIS — Z7901 Long term (current) use of anticoagulants: Secondary | ICD-10-CM | POA: Diagnosis not present

## 2020-09-16 DIAGNOSIS — I4891 Unspecified atrial fibrillation: Secondary | ICD-10-CM | POA: Diagnosis not present

## 2020-09-16 DIAGNOSIS — I10 Essential (primary) hypertension: Secondary | ICD-10-CM | POA: Diagnosis not present

## 2020-09-16 DIAGNOSIS — I270 Primary pulmonary hypertension: Secondary | ICD-10-CM | POA: Diagnosis not present

## 2020-09-16 DIAGNOSIS — Z1389 Encounter for screening for other disorder: Secondary | ICD-10-CM | POA: Diagnosis not present

## 2020-09-16 DIAGNOSIS — N400 Enlarged prostate without lower urinary tract symptoms: Secondary | ICD-10-CM | POA: Diagnosis not present

## 2020-09-16 DIAGNOSIS — Z0000 Encounter for general adult medical examination without abnormal findings: Secondary | ICD-10-CM | POA: Diagnosis not present

## 2020-09-16 DIAGNOSIS — E7800 Pure hypercholesterolemia, unspecified: Secondary | ICD-10-CM | POA: Diagnosis not present

## 2020-09-16 DIAGNOSIS — Z7901 Long term (current) use of anticoagulants: Secondary | ICD-10-CM | POA: Diagnosis not present

## 2020-09-16 DIAGNOSIS — D6869 Other thrombophilia: Secondary | ICD-10-CM | POA: Diagnosis not present

## 2020-09-16 DIAGNOSIS — I700 Atherosclerosis of aorta: Secondary | ICD-10-CM | POA: Diagnosis not present

## 2020-09-25 DIAGNOSIS — N400 Enlarged prostate without lower urinary tract symptoms: Secondary | ICD-10-CM | POA: Diagnosis not present

## 2020-09-25 DIAGNOSIS — I4891 Unspecified atrial fibrillation: Secondary | ICD-10-CM | POA: Diagnosis not present

## 2020-09-25 DIAGNOSIS — I270 Primary pulmonary hypertension: Secondary | ICD-10-CM | POA: Diagnosis not present

## 2020-09-25 DIAGNOSIS — I10 Essential (primary) hypertension: Secondary | ICD-10-CM | POA: Diagnosis not present

## 2020-09-25 DIAGNOSIS — E7800 Pure hypercholesterolemia, unspecified: Secondary | ICD-10-CM | POA: Diagnosis not present

## 2020-10-15 DIAGNOSIS — Z7901 Long term (current) use of anticoagulants: Secondary | ICD-10-CM | POA: Diagnosis not present

## 2020-10-18 ENCOUNTER — Other Ambulatory Visit: Payer: Self-pay

## 2020-10-18 ENCOUNTER — Emergency Department (HOSPITAL_BASED_OUTPATIENT_CLINIC_OR_DEPARTMENT_OTHER): Payer: Medicare Other

## 2020-10-18 ENCOUNTER — Emergency Department (HOSPITAL_BASED_OUTPATIENT_CLINIC_OR_DEPARTMENT_OTHER)
Admission: EM | Admit: 2020-10-18 | Discharge: 2020-10-18 | Disposition: A | Discharge status: Home / Self Care | Payer: Medicare Other | Attending: Emergency Medicine | Admitting: Emergency Medicine

## 2020-10-18 ENCOUNTER — Encounter (HOSPITAL_BASED_OUTPATIENT_CLINIC_OR_DEPARTMENT_OTHER): Payer: Self-pay | Admitting: Emergency Medicine

## 2020-10-18 DIAGNOSIS — I110 Hypertensive heart disease with heart failure: Secondary | ICD-10-CM | POA: Diagnosis not present

## 2020-10-18 DIAGNOSIS — E871 Hypo-osmolality and hyponatremia: Secondary | ICD-10-CM | POA: Diagnosis not present

## 2020-10-18 DIAGNOSIS — R519 Headache, unspecified: Secondary | ICD-10-CM

## 2020-10-18 DIAGNOSIS — I509 Heart failure, unspecified: Secondary | ICD-10-CM | POA: Diagnosis not present

## 2020-10-18 DIAGNOSIS — I482 Chronic atrial fibrillation, unspecified: Secondary | ICD-10-CM | POA: Diagnosis not present

## 2020-10-18 DIAGNOSIS — R0602 Shortness of breath: Secondary | ICD-10-CM | POA: Diagnosis not present

## 2020-10-18 DIAGNOSIS — R42 Dizziness and giddiness: Secondary | ICD-10-CM | POA: Diagnosis not present

## 2020-10-18 DIAGNOSIS — Z7901 Long term (current) use of anticoagulants: Secondary | ICD-10-CM | POA: Diagnosis not present

## 2020-10-18 DIAGNOSIS — F17210 Nicotine dependence, cigarettes, uncomplicated: Secondary | ICD-10-CM | POA: Diagnosis not present

## 2020-10-18 DIAGNOSIS — Z79899 Other long term (current) drug therapy: Secondary | ICD-10-CM | POA: Insufficient documentation

## 2020-10-18 DIAGNOSIS — I4891 Unspecified atrial fibrillation: Secondary | ICD-10-CM | POA: Diagnosis not present

## 2020-10-18 DIAGNOSIS — R0600 Dyspnea, unspecified: Secondary | ICD-10-CM

## 2020-10-18 LAB — URINALYSIS, ROUTINE W REFLEX MICROSCOPIC
Bilirubin Urine: NEGATIVE
Glucose, UA: NEGATIVE mg/dL
Hgb urine dipstick: NEGATIVE
Ketones, ur: NEGATIVE mg/dL
Leukocytes,Ua: NEGATIVE
Nitrite: NEGATIVE
Protein, ur: NEGATIVE mg/dL
Specific Gravity, Urine: 1.013 (ref 1.005–1.030)
pH: 7 (ref 5.0–8.0)

## 2020-10-18 LAB — COMPREHENSIVE METABOLIC PANEL
ALT: 26 U/L (ref 0–44)
AST: 26 U/L (ref 15–41)
Albumin: 3.6 g/dL (ref 3.5–5.0)
Alkaline Phosphatase: 100 U/L (ref 38–126)
Anion gap: 7 (ref 5–15)
BUN: 15 mg/dL (ref 8–23)
CO2: 28 mmol/L (ref 22–32)
Calcium: 8.5 mg/dL — ABNORMAL LOW (ref 8.9–10.3)
Chloride: 98 mmol/L (ref 98–111)
Creatinine, Ser: 0.81 mg/dL (ref 0.61–1.24)
GFR, Estimated: >60 mL/min (ref >60)
Glucose, Bld: 121 mg/dL — ABNORMAL HIGH (ref 70–99)
Potassium: 3.9 mmol/L (ref 3.5–5.1)
Sodium: 133 mmol/L — ABNORMAL LOW (ref 135–145)
Total Bilirubin: 0.6 mg/dL (ref 0.3–1.2)
Total Protein: 5.8 g/dL — ABNORMAL LOW (ref 6.5–8.1)

## 2020-10-18 LAB — BRAIN NATRIURETIC PEPTIDE: B Natriuretic Peptide: 334.8 pg/mL — ABNORMAL HIGH (ref 0.0–100.0)

## 2020-10-18 LAB — CBC WITH DIFFERENTIAL/PLATELET
Abs Immature Granulocytes: 0.02 10*3/uL (ref 0.00–0.07)
Basophils Absolute: 0 10*3/uL (ref 0.0–0.1)
Basophils Relative: 0 %
Eosinophils Absolute: 0.1 10*3/uL (ref 0.0–0.5)
Eosinophils Relative: 2 %
HCT: 35 % — ABNORMAL LOW (ref 39.0–52.0)
Hemoglobin: 11.7 g/dL — ABNORMAL LOW (ref 13.0–17.0)
Immature Granulocytes: 0 %
Lymphocytes Relative: 36 %
Lymphs Abs: 2.2 10*3/uL (ref 0.7–4.0)
MCH: 33 pg (ref 26.0–34.0)
MCHC: 33.4 g/dL (ref 30.0–36.0)
MCV: 98.6 fL (ref 80.0–100.0)
Monocytes Absolute: 0.5 10*3/uL (ref 0.1–1.0)
Monocytes Relative: 8 %
Neutro Abs: 3.2 10*3/uL (ref 1.7–7.7)
Neutrophils Relative %: 54 %
Platelets: 152 10*3/uL (ref 150–400)
RBC: 3.55 MIL/uL — ABNORMAL LOW (ref 4.22–5.81)
RDW: 14.4 % (ref 11.5–15.5)
WBC: 6 10*3/uL (ref 4.0–10.5)
nRBC: 0 % (ref 0.0–0.2)

## 2020-10-18 LAB — PROTIME-INR
INR: 2.2 — ABNORMAL HIGH (ref 0.8–1.2)
Prothrombin Time: 24.6 seconds — ABNORMAL HIGH (ref 11.4–15.2)

## 2020-10-18 LAB — TROPONIN I (HIGH SENSITIVITY)
Troponin I (High Sensitivity): 5 ng/L (ref <18)
Troponin I (High Sensitivity): 4 ng/L (ref <18)

## 2020-10-18 IMAGING — DX DG CHEST 2V
2 series · 2 of 2 positions shown · non-contrast
Comparison: [DATE].

CLINICAL DATA: Short of breath.

EXAM:
CHEST - 2 VIEW

[chest lat]
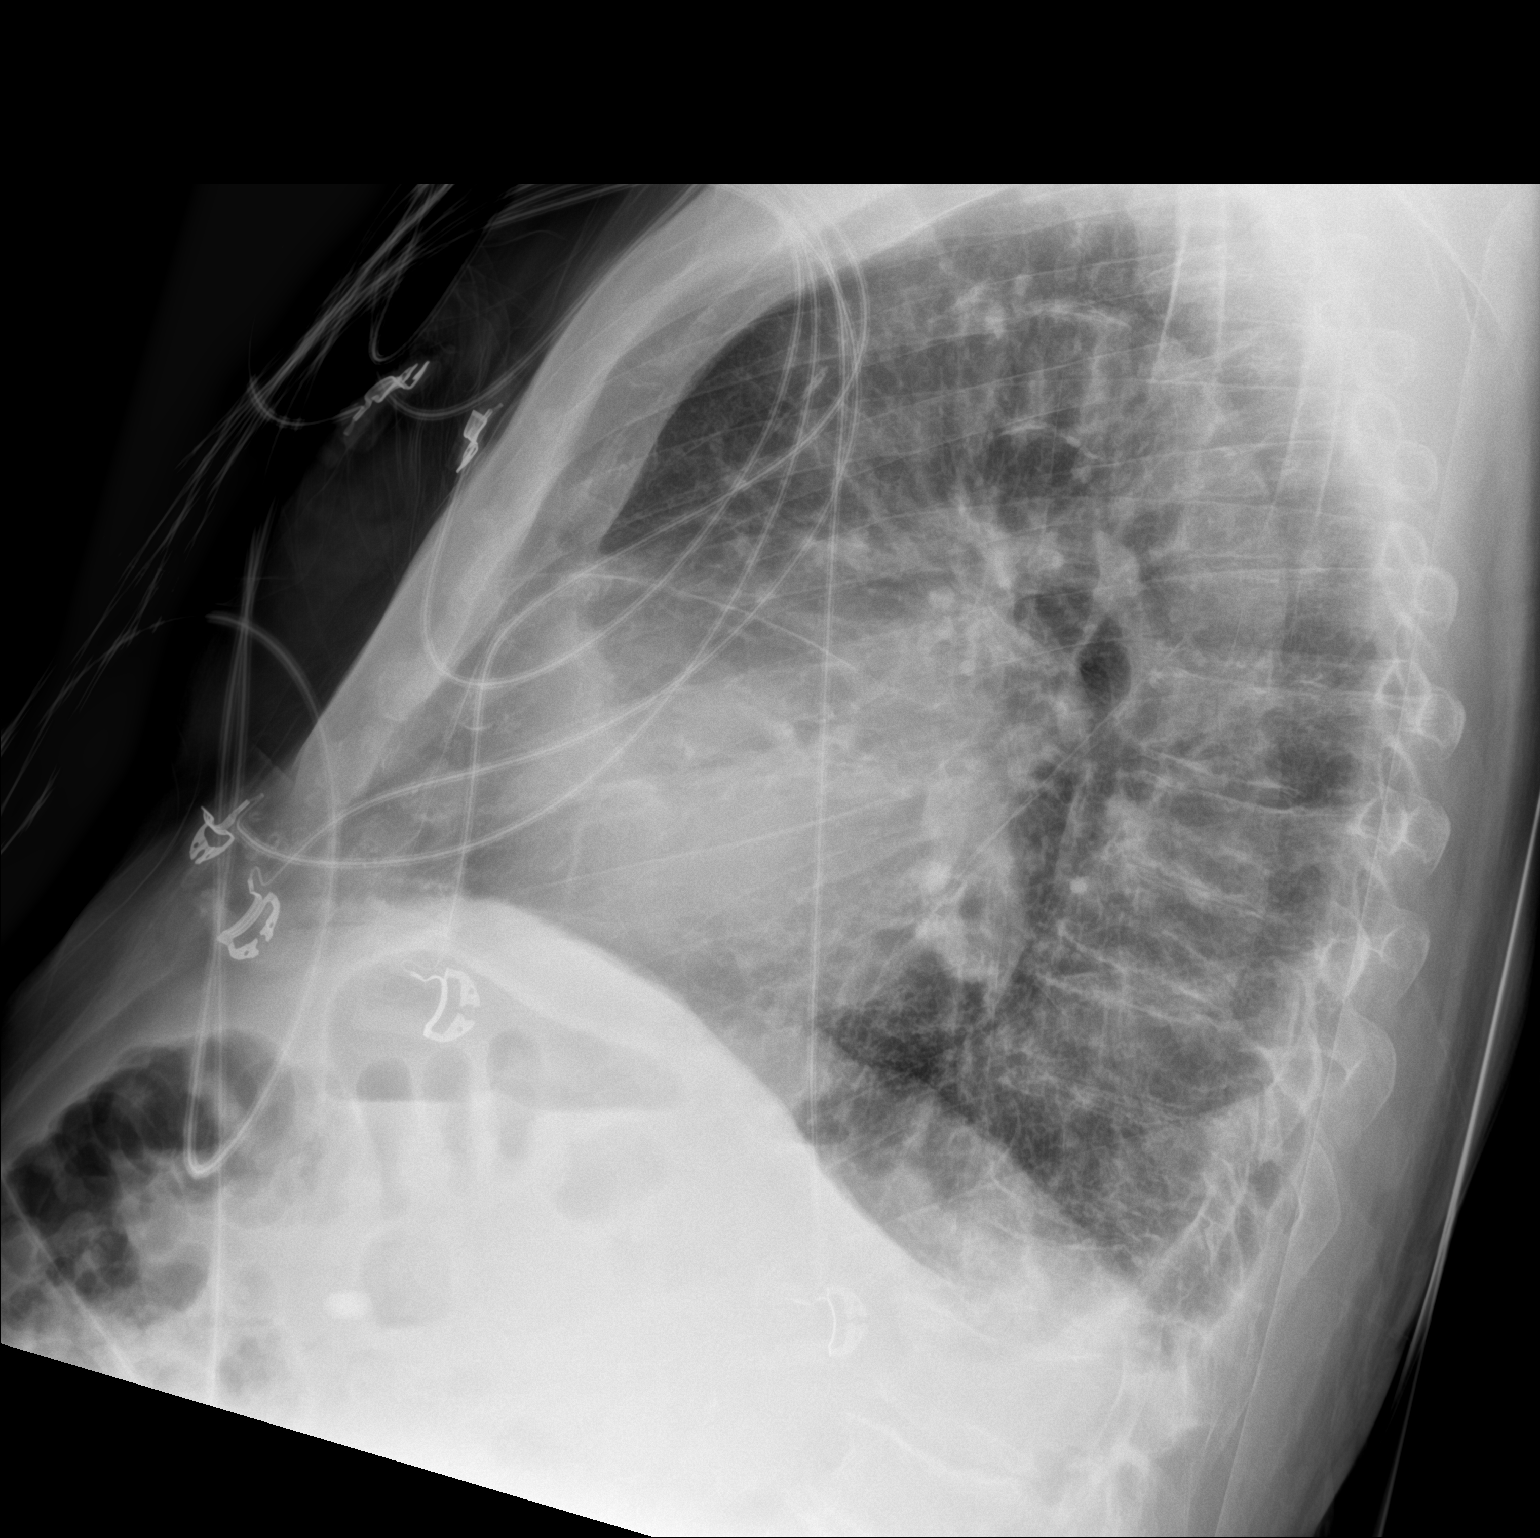

[chest ap]
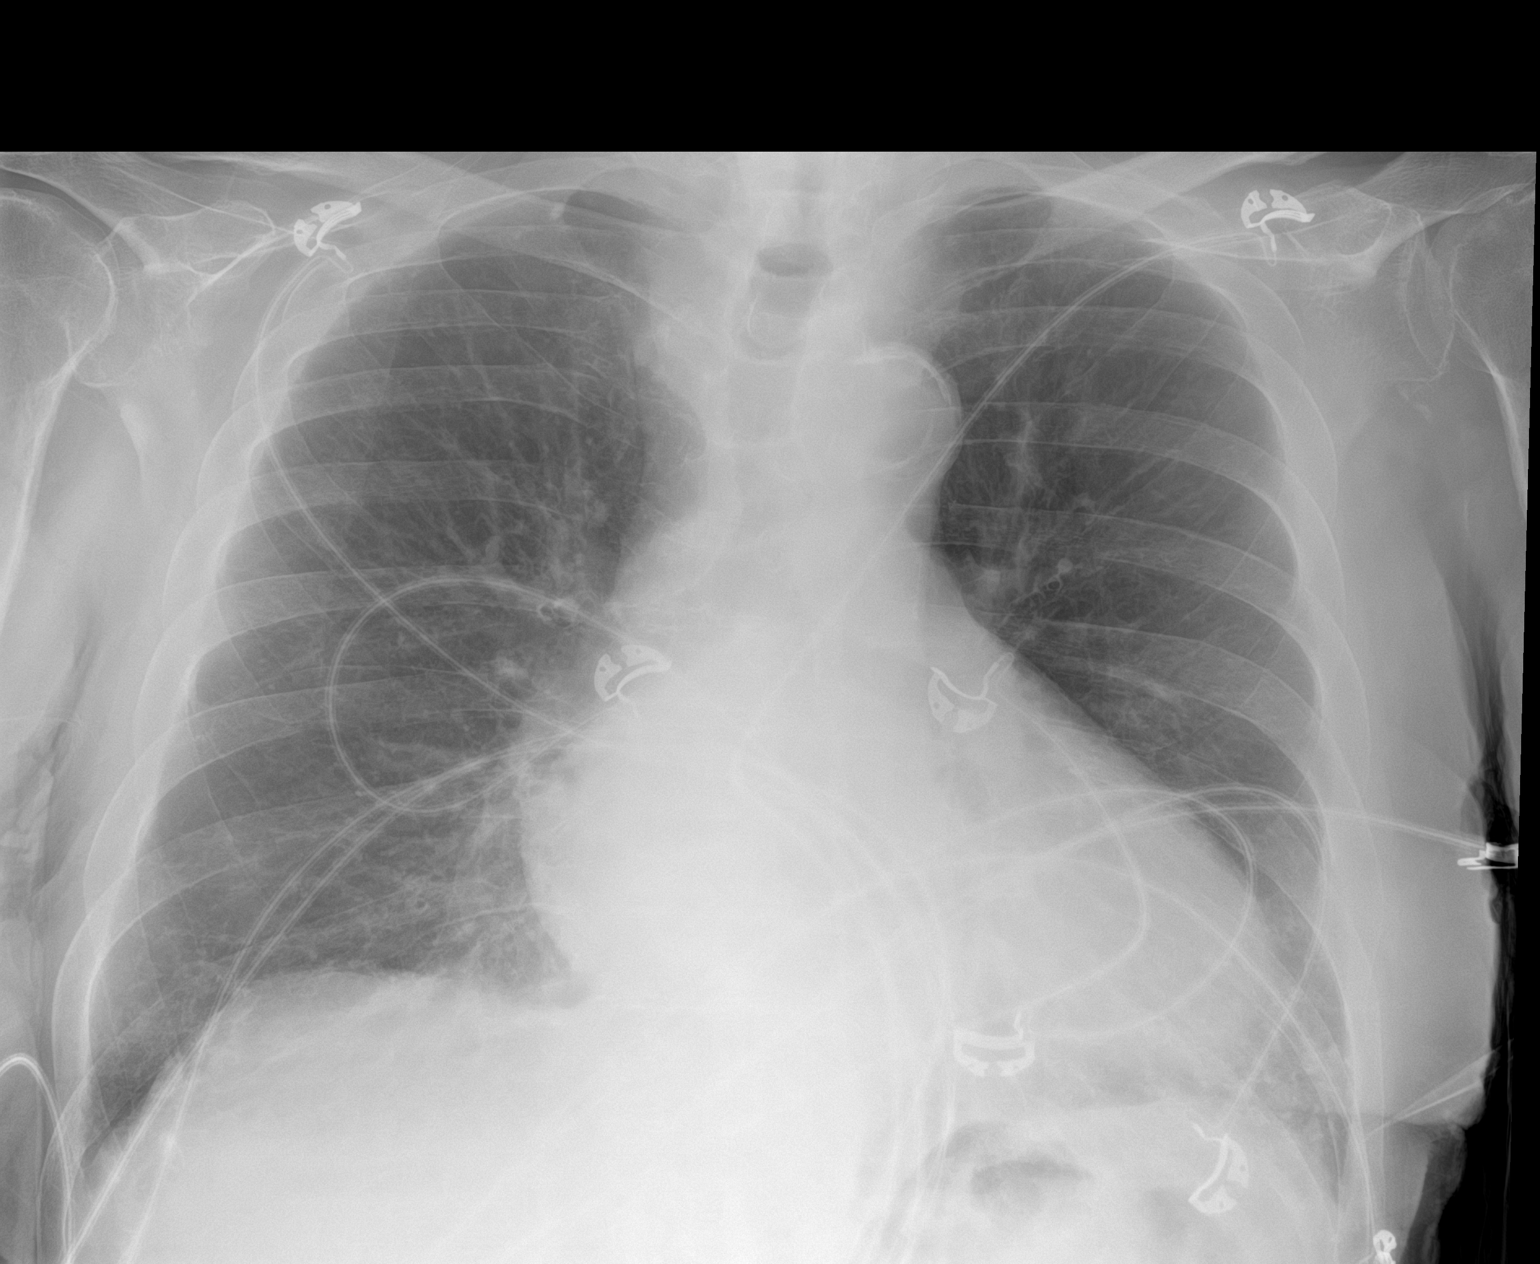

[2 of 2 positions shown; findings below may reference images not displayed]

FINDINGS: Cardiac silhouette is normal in size. No mediastinal or hilar masses
or evidence of adenopathy.

Mild interstitial thickening most evident in the lung bases. Lungs
otherwise clear. Possible minimal effusions. No pneumothorax.

Skeletal structures demonstrate mild vertebral body loss of height
at T12 and L1 stable from a CT dated [DATE]. No acute skeletal
abnormality.
IMPRESSION: 1. No acute cardiopulmonary disease.

## 2020-10-18 IMAGING — CT CT HEAD W/O CM
4 series · 17 of 47 positions shown, 19 images · non-contrast
Comparison: None.

CLINICAL DATA: Headache, dizziness

EXAM:
CT HEAD WITHOUT CONTRAST
TECHNIQUE: Contiguous axial images were obtained from the base of the skull
through the vertex without intravenous contrast.

[Series 2: head wo · axial · 0.44mm/px · z∈[-390,-270]mm · 7 of 33 slices shown, 9 images]
[im 5/33  brain]
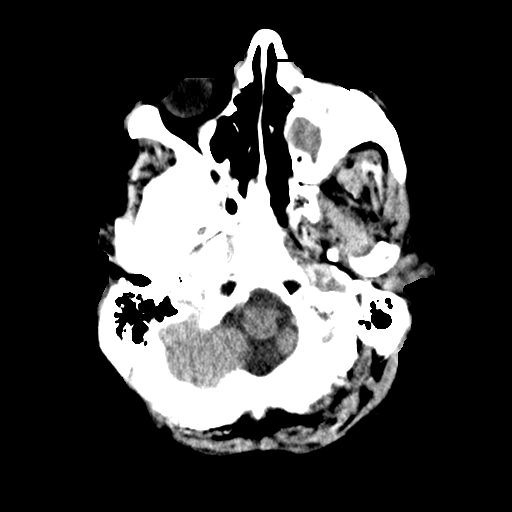
[im 5/33  bone]
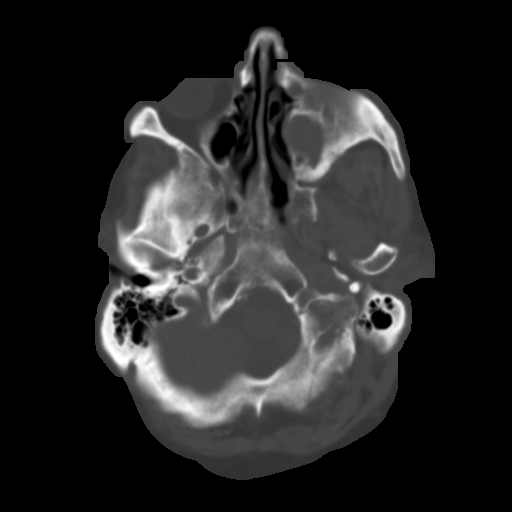
[im 9/33  brain]
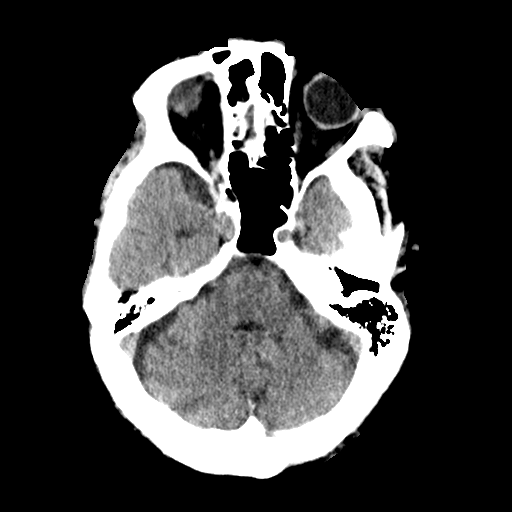
[im 13/33  brain]
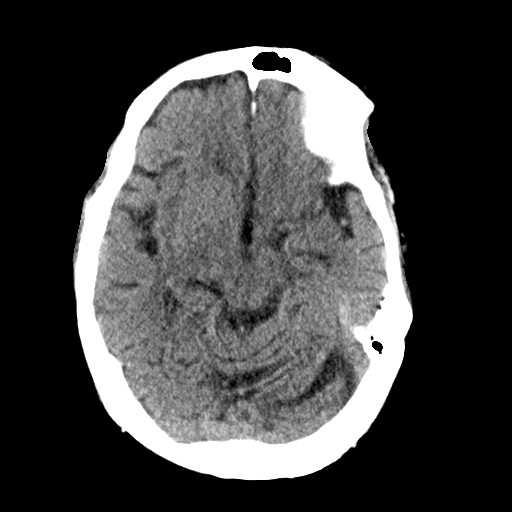
[im 17/33  brain]
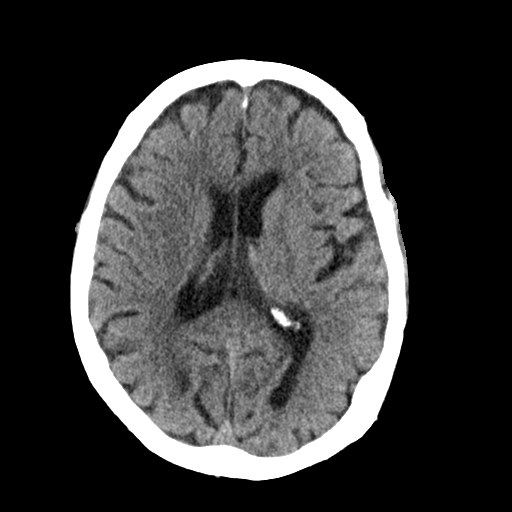
[im 21/33  brain]
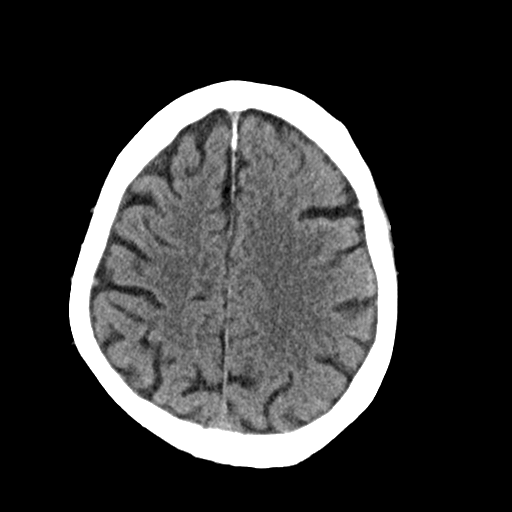
[im 21/33  bone]
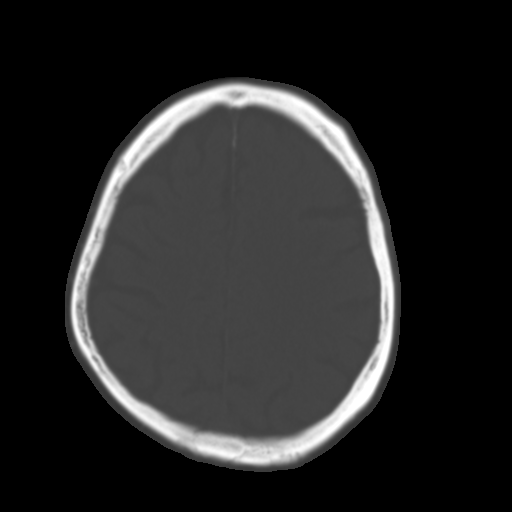
[im 25/33  brain]
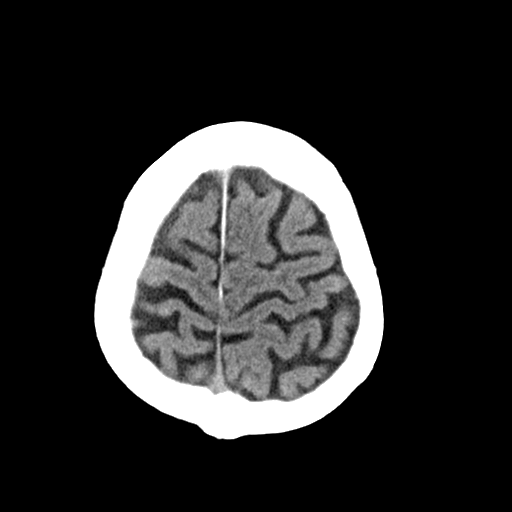
[im 29/33  brain]
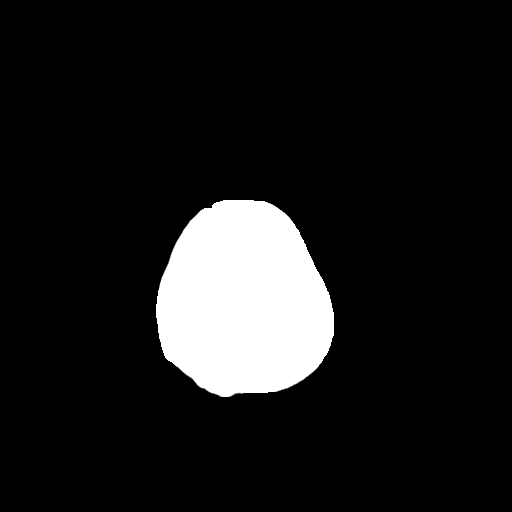

[Series 3: head bone · axial · 0.44mm/px · z∈[-394,-338]mm · 4 of 82 slices shown]
[im 9/82  bone]
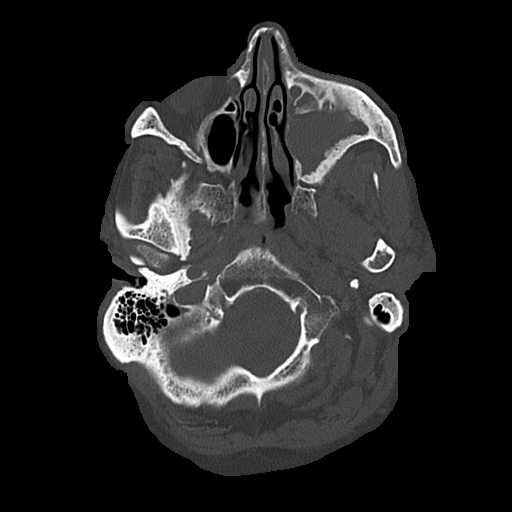
[im 17/82  bone]
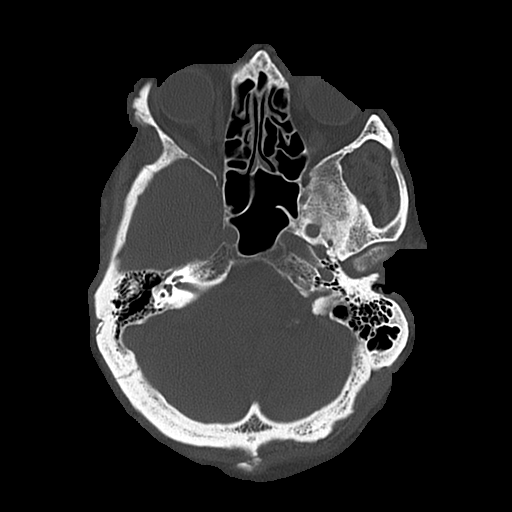
[im 25/82  bone]
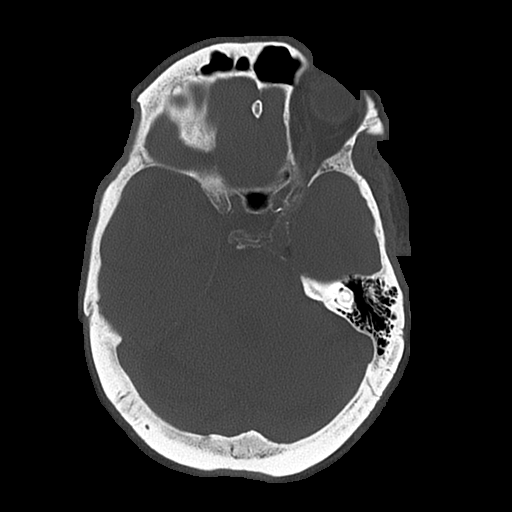
[im 37/82  bone]
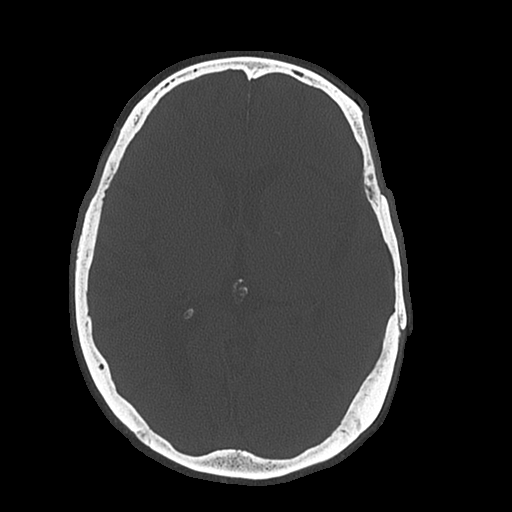

[Series 4: coronal soft · coronal · 0.35mm/px · 3 of 71 slices shown]
[im 24/71  brain]
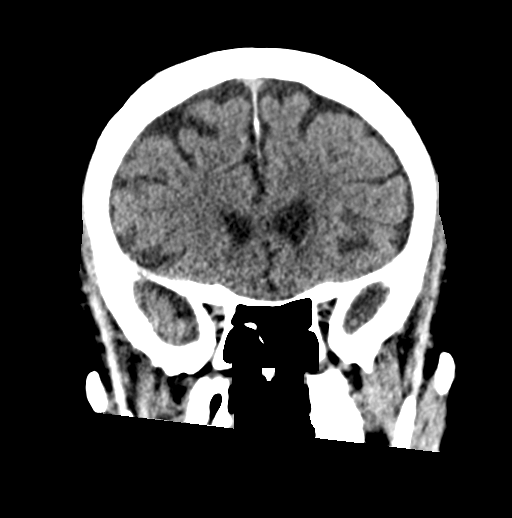
[im 32/71  brain]
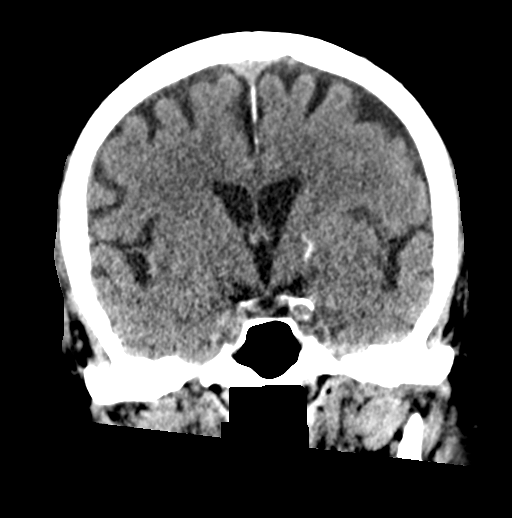
[im 39/71  brain]
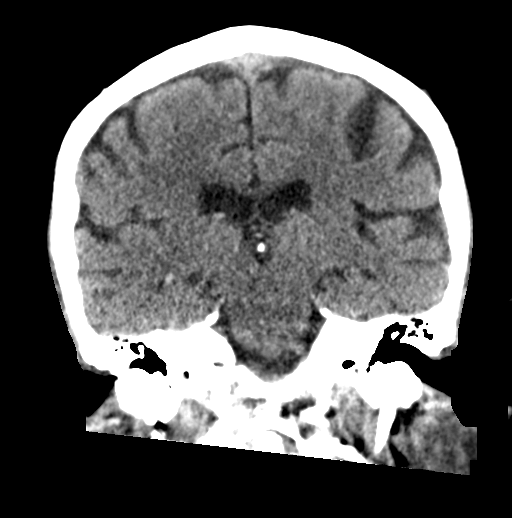

[Series 5: sagittal soft · sagittal · 0.35mm/px · 3 of 60 slices shown]
[im 20/60  brain]
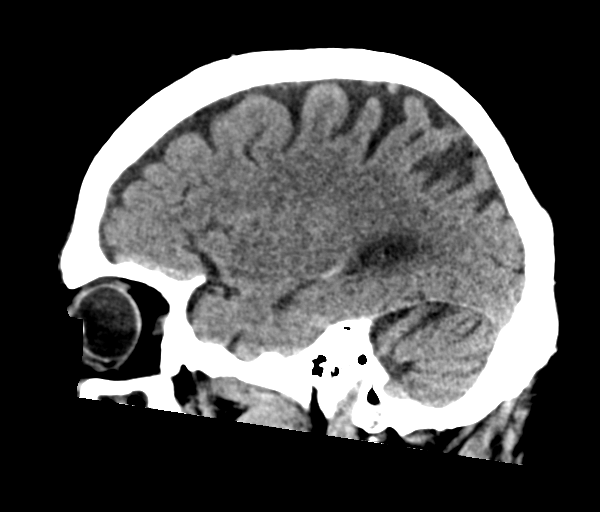
[im 30/60  brain]
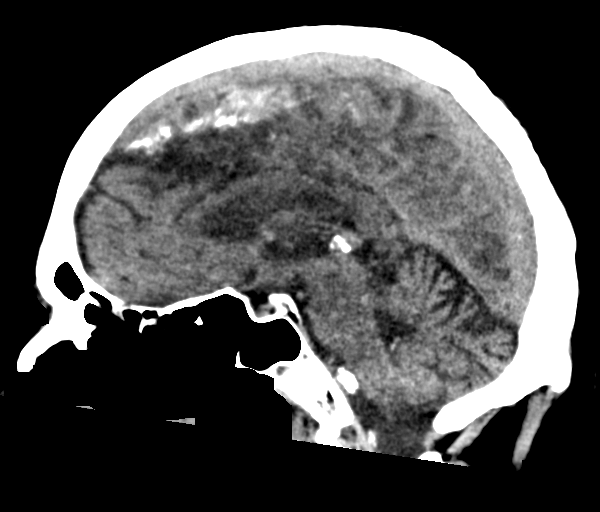
[im 40/60  brain]
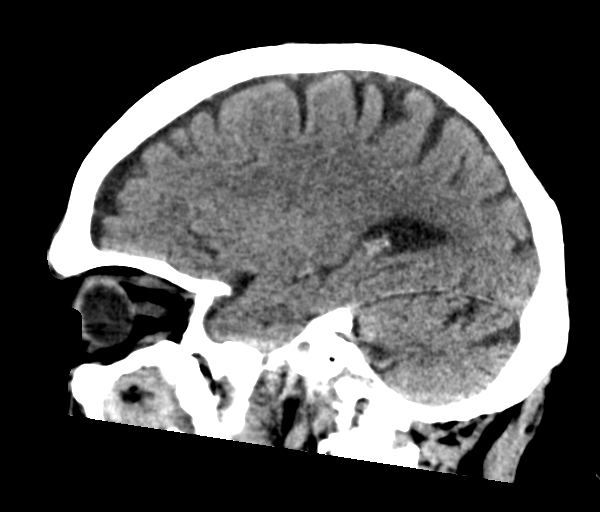

[17 of 47 positions shown; findings below may reference images not displayed]

FINDINGS: Brain: No evidence of acute infarction, hemorrhage, hydrocephalus,
extra-axial collection or mass lesion/mass effect.

Subcortical white matter and periventricular small vessel ischemic
changes.

Vascular: Mild intracranial atherosclerosis.

Skull: Normal. Negative for fracture or focal lesion.

Sinuses/Orbits: Complete opacification of the left maxillary sinus
with partial opacification of the right maxillary sinus. Associated
cortical thickening bilaterally, suggesting that this is chronic.
Mastoid air cells are clear bilaterally.

Other: None.
IMPRESSION: No evidence of acute intracranial abnormality. Small vessel ischemic
changes.

Chronic maxillary sinus opacification, left greater than right.

## 2020-10-18 MED ORDER — METOPROLOL TARTRATE 5 MG/5ML IV SOLN
2.5000 mg | Freq: Once | INTRAVENOUS | Status: AC
  Administered 2020-10-18: 2.5 mg via INTRAVENOUS
  Filled 2020-10-18: qty 5

## 2020-10-18 MED ORDER — METOPROLOL TARTRATE 37.5 MG PO TABS: 37.5000 mg | ORAL_TABLET | Freq: Two times a day (BID) | ORAL | 0 refills | Status: DC

## 2020-10-18 NOTE — ED Triage Notes (Signed)
Pt reports dizziness since yesterday, BP higher than usual at 150/100. Some blurred vision, no weakness.

## 2020-10-18 NOTE — Discharge Instructions (Addendum)
Increase your lasix dose to 40mg  (2 tablets) tomorrow morning. I wrote a new prescription for metoprolol with increased dose OR you can take 1.5 tablets of your 25mg  tablets twice daily until you follow up with Dr. Percival Spanish.

## 2020-10-18 NOTE — ED Provider Notes (Signed)
[facility] EMERGENCY DEPT Provider Note   CSN: [id number] Arrival date & time: 10/18/20  9628     History Chief Complaint  Patient presents with  . Dizziness    Adam Richards is a 87 y.o. male.  HPI      During the night Thursday/Friday morning woke up with dizziness, lightheadedness, constant since yesterday AM but once up and around feels better, is better today than yesterday Persistent atrial fib Headache, thinks it is from high blood pressure. Mild headache, low grade, started yesterday as well, nothing makes it better or worse, rarely has headaches like this No fever, nausea or vomiting, diarrhea, black or bloody stools, cough, chest pain Dyspnea for a few days, mild, no orthopnea, is somewhat worse with ambulation No urinary symptoms Denies numbness, weakness, difficulty talking or walking, visual changes or facial droop.  No changes in medication Swelling in legs hasn't changed BP higher than usual 150/100, previously 120-130/80 Thinks dehydrated, thinks doesn't drink enough water, no change in appetite, eating ok  Dizziness worse when standing and bending down, near syncope but no syncope Is always in atrial fibrillation but usually can't tell or feel it but has felt some heart racing the last few days   Past Medical History:  Diagnosis Date  . A-fib ([place])   . Hypertension     Patient Active Problem List   Diagnosis Date Noted  . Leg swelling 02/06/2020  . Pulmonary HTN ([place]) 02/06/2020  . Essential hypertension 06/06/2019  . Educated about COVID-19 virus infection 06/06/2019  . Subtherapeutic international normalized ratio (INR) 05/15/2019  . Hyponatremia 05/15/2019  . Lumbar pain 05/15/2019  . Back pain 05/14/2019  . Atrial fibrillation, chronic ([place]) 06/17/2015    Past Surgical History:  Procedure Laterality Date  . KNEE SURGERY     "removed crystals"  . TONSILLECTOMY AND ADENOIDECTOMY    . UMBILICAL HERNIA REPAIR         Family  History  Problem Relation Age of Onset  . Heart failure Mother   . Cancer Father   . Prostate cancer Father   . Cancer Brother   . Stroke Maternal Grandmother   . Tuberculosis Maternal Grandfather     Social History   Tobacco Use  . Smoking status: Current Some Day Smoker    Types: Cigarettes  . Smokeless tobacco: Never Used  . Tobacco comment: Rares cigars ,not cigarettes  Vaping Use  . Vaping Use: Never used  Substance Use Topics  . Alcohol use: No    Alcohol/week: 0.0 standard drinks  . Drug use: No    Home Medications Prior to Admission medications   Medication Sig Start Date End Date Taking? Authorizing Provider  furosemide (LASIX) 20 MG tablet Take 20 mg by mouth 2 (two) times daily. 10/11/19  Yes [provider]  lovastatin (MEVACOR) 20 MG tablet Take 20 mg by mouth daily. 05/20/15  Yes [provider]  potassium chloride (KLOR-CON) 10 MEQ tablet Take 10 mEq by mouth daily. 10/11/19  Yes [provider]  tamsulosin (FLOMAX) 0.4 MG CAPS capsule Take 1 capsule by mouth daily. 05/12/15  Yes [provider]  warfarin (COUMADIN) 2 MG tablet Take 1 tablet (2 mg total) by mouth one time only at 6 PM. 05/20/19 06/19/19 Yes Kyle, Tyrone A, DO  amLODipine (NORVASC) 5 MG tablet Take 2.5 mg by mouth daily. Take 0.5 tablet daily    [provider]  docusate calcium (SURFAK) 240 MG capsule Take 240 mg by mouth daily.  [provider]  dutasteride (AVODART) 0.5 MG capsule Take 1 capsule by mouth daily. 04/08/15   [provider]  metoprolol tartrate 37.5 MG TABS Take 37.5 mg by mouth 2 (two) times daily. 10/18/20 11/17/20  Gareth Morgan, MD    Allergies    Patient has no known allergies.  Review of Systems   Review of Systems  Constitutional: Positive for fatigue. Negative for appetite change and fever.  HENT: Negative for congestion and sore throat.   Eyes: Negative for visual disturbance.  Respiratory: Positive  for shortness of breath. Negative for cough.   Cardiovascular: Positive for palpitations. Negative for chest pain.  Gastrointestinal: Negative for abdominal pain, diarrhea, nausea and vomiting.  Genitourinary: Negative for difficulty urinating and dysuria.  Skin: Negative for rash.  Neurological: Positive for light-headedness and headaches. Negative for seizures, syncope, facial asymmetry, speech difficulty and weakness.    Physical Exam Updated Vital Signs BP (!) 167/98   Pulse (!) 28   Temp (!) 96 F (35.6 C) (Axillary)   Resp 14   Ht 5\' 10"  (1.778 m)   Wt 86.2 kg   SpO2 95%   BMI 27.26 kg/m   Physical Exam Constitutional:      General: He is not in acute distress.    Appearance: Normal appearance. He is not ill-appearing.  HENT:     Head: Normocephalic and atraumatic.  Eyes:     General: No visual field deficit.    Extraocular Movements: Extraocular movements intact.     Conjunctiva/sclera: Conjunctivae normal.     Pupils: Pupils are equal, round, and reactive to light.  Cardiovascular:     Rate and Rhythm: Normal rate. Rhythm irregular.     Pulses: Normal pulses.  Pulmonary:     Effort: Pulmonary effort is normal. No respiratory distress.     Breath sounds: No wheezing or rales.  Abdominal:     General: Abdomen is flat. There is no distension.     Tenderness: There is no abdominal tenderness.  Musculoskeletal:        General: No tenderness.     Cervical back: Normal range of motion.     Right lower leg: Edema (3+) present.     Left lower leg: Edema (3+) present.  Skin:    General: Skin is warm and dry.     Findings: No erythema or rash.  Neurological:     General: No focal deficit present.     Mental Status: He is alert and oriented to person, place, and time.     GCS: GCS eye subscore is 4. GCS verbal subscore is 5. GCS motor subscore is 6.     Cranial Nerves: No cranial nerve deficit, dysarthria or facial asymmetry.     Sensory: No sensory deficit.      Motor: No weakness or tremor.     Coordination: Coordination normal. Finger-Nose-Finger Test normal.     Gait: Gait normal.     ED Results / Procedures / Treatments   Labs (all labs ordered are listed, but only abnormal results are displayed) Labs Reviewed  CBC WITH DIFFERENTIAL/PLATELET - Abnormal; Notable for the following components:      Result Value   RBC 3.55 (*)    Hemoglobin 11.7 (*)    HCT 35.0 (*)    All other components within normal limits  COMPREHENSIVE METABOLIC PANEL - Abnormal; Notable for the following components:   Sodium 133 (*)    Glucose, Bld 121 (*)    Calcium 8.5 (*)  Total Protein 5.8 (*)    All other components within normal limits  BRAIN NATRIURETIC PEPTIDE - Abnormal; Notable for the following components:   B Natriuretic Peptide 334.8 (*)    All other components within normal limits  PROTIME-INR - Abnormal; Notable for the following components:   Prothrombin Time 24.6 (*)    INR 2.2 (*)    All other components within normal limits  URINALYSIS, ROUTINE W REFLEX MICROSCOPIC  TROPONIN I (HIGH SENSITIVITY)  TROPONIN I (HIGH SENSITIVITY)    EKG EKG Interpretation  Date/Time:  Saturday Oct 18 2020 09:19:44 EDT Ventricular Rate:  74 PR Interval:    QRS Duration: 103 QT Interval:  380 QTC Calculation: 422 R Axis:   82 Text Interpretation: Atrial fibrillation Paired ventricular premature complexes Borderline right axis deviation Low voltage, extremity leads Probable anterolateral infarct, old Since prior ECG, rate has slowed Confirmed by Gareth Morgan [phone]) on 10/18/2020 9:31:31 AM   Radiology DG Chest 2 View  Result Date: 10/18/2020 CLINICAL DATA:  Short of breath. EXAM: CHEST - 2 VIEW COMPARISON:  11/29/2019. FINDINGS: Cardiac silhouette is normal in size. No mediastinal or hilar masses or evidence of adenopathy. Mild interstitial thickening most evident in the lung bases. Lungs otherwise clear. Possible minimal effusions. No pneumothorax.  Skeletal structures demonstrate mild vertebral body loss of height at T12 and L1 stable from a CT dated 12/17/2019. No acute skeletal abnormality. IMPRESSION: 1. No acute cardiopulmonary disease. Electronically Signed   By: Lajean Manes M.D.   On: 10/18/2020 10:41   CT Head Wo Contrast  Result Date: 10/18/2020 CLINICAL DATA:  Headache, dizziness EXAM: CT HEAD WITHOUT CONTRAST TECHNIQUE: Contiguous axial images were obtained from the base of the skull through the vertex without intravenous contrast. COMPARISON:  None. FINDINGS: Brain: No evidence of acute infarction, hemorrhage, hydrocephalus, extra-axial collection or mass lesion/mass effect. Subcortical white matter and periventricular small vessel ischemic changes. Vascular: Mild intracranial atherosclerosis. Skull: Normal. Negative for fracture or focal lesion. Sinuses/Orbits: Complete opacification of the left maxillary sinus with partial opacification of the right maxillary sinus. Associated cortical thickening bilaterally, suggesting that this is chronic. Mastoid air cells are clear bilaterally. Other: None. IMPRESSION: No evidence of acute intracranial abnormality. Small vessel ischemic changes. Chronic maxillary sinus opacification, left greater than right. Electronically Signed   By: Julian Hy M.D.   On: 10/18/2020 11:06    Procedures Procedures   Medications Ordered in ED Medications  metoprolol tartrate (LOPRESSOR) injection 2.5 mg (2.5 mg Intravenous Given 10/18/20 1004)    ED Course  I have reviewed the triage vital signs and the nursing notes.  Pertinent labs & imaging results that were available during my care of the patient were reviewed by me and considered in my medical decision making (see chart for details).    MDM Rules/Calculators/A&P                          87 year old male with a history of pulmonary hypertension, chronic atrial fibrillation on Coumadin, hypertension, hyperlipidemia BPH who presents with  concern for lightheadedness, dyspnea, and found to have more labile heart rates in atrial fibrillation.  Reports he is in chronic atrial fibrillation but typically does not have symptoms and his rate is well controlled.  While his rate is controlled in the 80s to 90s at rest, with minimal movement he seemed to have increase in heart rate up to 120, and overall suspect symptoms are secondary to symptomatic atrial fibrillation.  His INR is therapeutic and have low suspicion for pulmonary embolus.  Chest x-ray does not show signs of significant pulmonary edema, pneumonia or pneumothorax.  Regarding lightheadedness and headache, CT head was done which showed no evidence of intracranial abnormalities.  Do not feel history and exam are consistent with CVA.  Denies chest pain, and delta troponins negative and have low suspicion for ACS.  No sign of urinary tract infection or other history to suggest infectious etiology.  Given 2.5 mg of metoprolol IV.  BNP elevated to the 300s, with no prior for comparison.  Given he does not have edema on chest x-ray, no oxygen requirement, no significant dyspnea, do not feel he requires admission for CHF.  Will recommend he increase his dose of Lasix for 1 day to 40 mg in the morning.  Given has hypertension and labile heart rate, I feel he is also appropriate for increase in his metoprolol and close cardiology follow-up.  We will increase his metoprolol by half a tablet twice daily ordered to 37.5 mg.  Discussed reasons to return to the emergency department in detail, and recommendation for close cardiology follow-up. Patient discharged in stable condition with understanding of reasons to return.   Final Clinical Impression(s) / ED Diagnoses Final diagnoses:  Atrial fibrillation, chronic (HCC)  Dyspnea, unspecified type  Acute congestive heart failure, unspecified heart failure type ([place])  Acute nonintractable headache, unspecified headache type    Rx / DC Orders ED Discharge  Orders         Ordered    metoprolol tartrate 37.5 MG TABS  2 times daily        10/18/20 1257           Gareth Morgan, MD 10/18/20 2219

## 2020-11-13 DIAGNOSIS — Z7901 Long term (current) use of anticoagulants: Secondary | ICD-10-CM | POA: Diagnosis not present

## 2020-11-16 NOTE — Progress Notes (Signed)
Cardiology Office Note   Date:  11/18/2020   ID:  Adam Richards, DOB 13-Oct-1926, MRN [id number]  PCP:  Wenda Low, MD  Cardiologist:   Minus Breeding, MD    Chief Complaint  Patient presents with   Dizziness      History of Present Illness: Adam Richards is a 87 y.o. male who presents for evaluation of atrial fibrillation. He moved here from [place]. He has a long-standing history of permanent atrial fibrillation. He's been on anticoagulation for years. He had a mildly decreased LV function and moderately increased pulmonary pressures.    I did repeat an echocardiogram which suggested that his EF was low normal 50 to 55%.  He had moderately severe tricuspid regurgitation with moderately elevated pulmonary pressures.     Since I saw him he was in the emergency room last month with dizziness.  He woke up and the room was circling around him.  He felt lightheaded a little bit with standing.  He was not really feeling palpitations.  He did not have any syncope.  He been feeling fine up to this.  In the emergency room he was noted to have blood pressure of 150/110.  This heart rate was slightly elevated.  I see that BNP was elevated but chest x-ray was unremarkable.  CT of his head showed no acute abnormalities.  Enzymes were normal. I reviewed these records for this visit.    He was given 1 dose of IV metoprolol.  He was told to take an extra dose of Lasix for a day.  His beta-blocker was increased from 25 twice a day to 37.5 mg twice a day daily and he has been continuing this.  He brings me a blood pressure log and his blood pressures have been well controlled since then.  Heart rates have been controlled.  He is chronically in atrial fibrillation.  He denies any other cardiovascular symptoms and has had no further dizziness.  He has not been having any chest pressure, neck or arm discomfort.  He has had no weight gain.  He has some chronic mild lower extremity edema.   Past Medical  History:  Diagnosis Date   A-fib [facility])    Hypertension     Past Surgical History:  Procedure Laterality Date   KNEE SURGERY     "removed crystals"   TONSILLECTOMY AND ADENOIDECTOMY     UMBILICAL HERNIA REPAIR       Current Outpatient Medications  Medication Sig Dispense Refill   amLODipine (NORVASC) 2.5 MG tablet Take 1 tablet by mouth daily.     docusate calcium (SURFAK) 240 MG capsule Take 240 mg by mouth daily.     dutasteride (AVODART) 0.5 MG capsule Take 1 capsule by mouth daily.  2   furosemide (LASIX) 20 MG tablet Take 20 mg by mouth 2 (two) times daily.     lovastatin (MEVACOR) 20 MG tablet Take 20 mg by mouth daily.  1   potassium chloride (KLOR-CON) 10 MEQ tablet Take 10 mEq by mouth daily.     tamsulosin (FLOMAX) 0.4 MG CAPS capsule Take 1 capsule by mouth daily.     warfarin (COUMADIN) 2.5 MG tablet SMARTSIG:1 Tablet(s) By Mouth Every Evening     Metoprolol Tartrate 37.5 MG TABS Take 37.5 mg by mouth 2 (two) times daily. 180 tablet 3   No current facility-administered medications for this visit.    Allergies:   Patient has no known allergies.    ROS:  Please see the history of present illness.   Otherwise, review of systems are positive for nne.   All other systems are reviewed and negative.    PHYSICAL EXAM: VS:  BP 134/76   Pulse 75   Ht 5\' 10"  (1.778 m)   Wt 190 lb (86.2 kg)   SpO2 97%   BMI 27.26 kg/m  , BMI Body mass index is 27.26 kg/m.  GENERAL:  Well appearing NECK: Positive jugular venous distention, waveform within normal limits, carotid upstroke brisk and symmetric, no bruits, no thyromegaly LUNGS:  Clear to auscultation bilaterally CHEST:  Unremarkable HEART:  PMI not displaced or sustained,S1 and S2 within normal limits, no S3, no clicks, no rubs, soft holosystolic murmur at the third left intercostal space murmurs, irregular  ABD:  Flat, positive bowel sounds normal in frequency in pitch, no bruits, no rebound, no guarding, no midline  pulsatile mass, no hepatomegaly, no splenomegaly EXT:  2 plus pulses throughout, mild bilateral lower extremity edema, no cyanosis no clubbing    EKG:  EKG is not  ordered today. 10/18/2020 atrial fibrillation, axis within normal limits, premature ventricular contractions, poor anterior R wave progression, nonspecific ST-T wave changes.  Recent Labs: 10/18/2020: ALT 26; B Natriuretic Peptide 334.8; BUN 15; Creatinine, Ser 0.81; Hemoglobin 11.7; Platelets 152; Potassium 3.9; Sodium 133    Lipid Panel No results found for: CHOL, TRIG, HDL, CHOLHDL, VLDL, LDLCALC, LDLDIRECT    Wt Readings from Last 3 Encounters:  11/18/20 190 lb (86.2 kg)  10/18/20 190 lb (86.2 kg)  08/12/20 193 lb 6.4 oz (87.7 kg)      Other studies Reviewed: Additional studies/ records that were reviewed today include: ED records Review of the above records demonstrates: See elsewhere   ASSESSMENT AND PLAN:  ATRIAL FIB:  The patient has chronic atrial fibrillation. [name] has a CHA2DS2 - VASc score of 3 .  He seems to have reasonable rate control and tolerates anticoagulation and does not want a switch to DOAC.  No change in therapy.   HTN: Blood pressure is controlled on the higher dose of beta-blocker and I will continue this.  I did say that if he has blood pressures like he did on the day of admission he could take an extra amlodipine on those mornings.  PULMONARY HTN:     We talked about this and we are going to manage it conservatively as its not causing severe symptoms.   EDEMA:   This is mild.  It actually did not get better with the lower dose of amlodipine so this could be increased in the future if necessary.   DIZZINESS: Dizziness was the acute problem that necessitated this add-on visit and is no longer happening.  This may have been related to a hypertensive event which seems to be better controlled.   Current medicines are reviewed at length with the patient today.  The patient does not  have concerns regarding medicines.  The following changes have been made: As above  Labs/ tests ordered today include: None  No orders of the defined types were placed in this encounter.   Disposition:   FU with March of next year.  Signed, Minus Breeding, MD  11/18/2020 2:48 PM    [facility]

## 2020-11-18 ENCOUNTER — Ambulatory Visit (INDEPENDENT_AMBULATORY_CARE_PROVIDER_SITE_OTHER): Payer: Medicare Other | Admitting: Cardiology

## 2020-11-18 ENCOUNTER — Other Ambulatory Visit: Payer: Self-pay

## 2020-11-18 ENCOUNTER — Encounter: Payer: Self-pay | Admitting: Cardiology

## 2020-11-18 VITALS — BP 134/76 | HR 75 | Ht 70.0 in | Wt 190.0 lb

## 2020-11-18 DIAGNOSIS — I272 Pulmonary hypertension, unspecified: Secondary | ICD-10-CM | POA: Diagnosis not present

## 2020-11-18 DIAGNOSIS — I482 Chronic atrial fibrillation, unspecified: Secondary | ICD-10-CM | POA: Diagnosis not present

## 2020-11-18 DIAGNOSIS — M7989 Other specified soft tissue disorders: Secondary | ICD-10-CM

## 2020-11-18 MED ORDER — METOPROLOL TARTRATE 37.5 MG PO TABS: 37.5000 mg | ORAL_TABLET | Freq: Two times a day (BID) | ORAL | 3 refills | Status: DC

## 2020-11-18 NOTE — Patient Instructions (Signed)
  Follow-Up: At [facility], you and your health needs are our priority.  As part of our continuing mission to provide you with exceptional heart care, we have created designated Provider Care Teams.  These Care Teams include your primary Cardiologist (physician) and Advanced Practice Providers (APPs -  Physician Assistants and Nurse Practitioners) who all work together to provide you with the care you need, when you need it.  We recommend signing up for the patient portal called "MyChart".  Sign up information is provided on this After Visit Summary.  MyChart is used to connect with patients for Virtual Visits (Telemedicine).  Patients are able to view lab/test results, encounter notes, upcoming appointments, etc.  Non-urgent messages can be sent to your provider as well.   To learn more about what you can do with MyChart, go to [email or web address].    Your next appointment:    MARCH 2023

## 2020-11-28 DIAGNOSIS — N400 Enlarged prostate without lower urinary tract symptoms: Secondary | ICD-10-CM | POA: Diagnosis not present

## 2020-11-28 DIAGNOSIS — I4891 Unspecified atrial fibrillation: Secondary | ICD-10-CM | POA: Diagnosis not present

## 2020-11-28 DIAGNOSIS — E7800 Pure hypercholesterolemia, unspecified: Secondary | ICD-10-CM | POA: Diagnosis not present

## 2020-11-28 DIAGNOSIS — I10 Essential (primary) hypertension: Secondary | ICD-10-CM | POA: Diagnosis not present

## 2020-11-28 DIAGNOSIS — I270 Primary pulmonary hypertension: Secondary | ICD-10-CM | POA: Diagnosis not present

## 2020-12-16 DIAGNOSIS — Z7901 Long term (current) use of anticoagulants: Secondary | ICD-10-CM | POA: Diagnosis not present

## 2020-12-26 DIAGNOSIS — Z85828 Personal history of other malignant neoplasm of skin: Secondary | ICD-10-CM | POA: Diagnosis not present

## 2020-12-26 DIAGNOSIS — L814 Other melanin hyperpigmentation: Secondary | ICD-10-CM | POA: Diagnosis not present

## 2020-12-26 DIAGNOSIS — L570 Actinic keratosis: Secondary | ICD-10-CM | POA: Diagnosis not present

## 2020-12-26 DIAGNOSIS — D229 Melanocytic nevi, unspecified: Secondary | ICD-10-CM | POA: Diagnosis not present

## 2020-12-26 DIAGNOSIS — L905 Scar conditions and fibrosis of skin: Secondary | ICD-10-CM | POA: Diagnosis not present

## 2021-01-09 DIAGNOSIS — H00014 Hordeolum externum left upper eyelid: Secondary | ICD-10-CM | POA: Diagnosis not present

## 2021-01-09 DIAGNOSIS — H52203 Unspecified astigmatism, bilateral: Secondary | ICD-10-CM | POA: Diagnosis not present

## 2021-01-16 DIAGNOSIS — Z7901 Long term (current) use of anticoagulants: Secondary | ICD-10-CM | POA: Diagnosis not present

## 2021-01-16 DIAGNOSIS — H00014 Hordeolum externum left upper eyelid: Secondary | ICD-10-CM | POA: Diagnosis not present

## 2021-01-30 DIAGNOSIS — Z7901 Long term (current) use of anticoagulants: Secondary | ICD-10-CM | POA: Diagnosis not present

## 2021-02-25 DIAGNOSIS — I270 Primary pulmonary hypertension: Secondary | ICD-10-CM | POA: Diagnosis not present

## 2021-02-25 DIAGNOSIS — N400 Enlarged prostate without lower urinary tract symptoms: Secondary | ICD-10-CM | POA: Diagnosis not present

## 2021-02-25 DIAGNOSIS — Z7901 Long term (current) use of anticoagulants: Secondary | ICD-10-CM | POA: Diagnosis not present

## 2021-02-25 DIAGNOSIS — D6869 Other thrombophilia: Secondary | ICD-10-CM | POA: Diagnosis not present

## 2021-02-25 DIAGNOSIS — Z23 Encounter for immunization: Secondary | ICD-10-CM | POA: Diagnosis not present

## 2021-02-25 DIAGNOSIS — I700 Atherosclerosis of aorta: Secondary | ICD-10-CM | POA: Diagnosis not present

## 2021-02-25 DIAGNOSIS — E7800 Pure hypercholesterolemia, unspecified: Secondary | ICD-10-CM | POA: Diagnosis not present

## 2021-02-25 DIAGNOSIS — I10 Essential (primary) hypertension: Secondary | ICD-10-CM | POA: Diagnosis not present

## 2021-02-25 DIAGNOSIS — I4891 Unspecified atrial fibrillation: Secondary | ICD-10-CM | POA: Diagnosis not present

## 2021-03-26 DIAGNOSIS — Z7901 Long term (current) use of anticoagulants: Secondary | ICD-10-CM | POA: Diagnosis not present

## 2021-04-28 DIAGNOSIS — L989 Disorder of the skin and subcutaneous tissue, unspecified: Secondary | ICD-10-CM | POA: Diagnosis not present

## 2021-04-28 DIAGNOSIS — Z08 Encounter for follow-up examination after completed treatment for malignant neoplasm: Secondary | ICD-10-CM | POA: Diagnosis not present

## 2021-04-28 DIAGNOSIS — L814 Other melanin hyperpigmentation: Secondary | ICD-10-CM | POA: Diagnosis not present

## 2021-04-28 DIAGNOSIS — Z85828 Personal history of other malignant neoplasm of skin: Secondary | ICD-10-CM | POA: Diagnosis not present

## 2021-04-28 DIAGNOSIS — D225 Melanocytic nevi of trunk: Secondary | ICD-10-CM | POA: Diagnosis not present

## 2021-04-28 DIAGNOSIS — L570 Actinic keratosis: Secondary | ICD-10-CM | POA: Diagnosis not present

## 2021-04-28 DIAGNOSIS — D485 Neoplasm of uncertain behavior of skin: Secondary | ICD-10-CM | POA: Diagnosis not present

## 2021-04-28 DIAGNOSIS — L821 Other seborrheic keratosis: Secondary | ICD-10-CM | POA: Diagnosis not present

## 2021-04-30 DIAGNOSIS — E7800 Pure hypercholesterolemia, unspecified: Secondary | ICD-10-CM | POA: Diagnosis not present

## 2021-04-30 DIAGNOSIS — N400 Enlarged prostate without lower urinary tract symptoms: Secondary | ICD-10-CM | POA: Diagnosis not present

## 2021-04-30 DIAGNOSIS — I270 Primary pulmonary hypertension: Secondary | ICD-10-CM | POA: Diagnosis not present

## 2021-04-30 DIAGNOSIS — I4891 Unspecified atrial fibrillation: Secondary | ICD-10-CM | POA: Diagnosis not present

## 2021-04-30 DIAGNOSIS — I10 Essential (primary) hypertension: Secondary | ICD-10-CM | POA: Diagnosis not present

## 2021-05-21 DIAGNOSIS — Z7901 Long term (current) use of anticoagulants: Secondary | ICD-10-CM | POA: Diagnosis not present

## 2021-08-17 DIAGNOSIS — E785 Hyperlipidemia, unspecified: Secondary | ICD-10-CM | POA: Insufficient documentation

## 2021-08-17 NOTE — Progress Notes (Signed)
?  ?Cardiology Office Note ? ? ?Date:  08/18/2021  ? ?ID:  Adam Richards, DOB [birth year], MRN [id number] ? ?PCP:  Adam Low, MD  ?Cardiologist:   Minus Breeding, MD  ? ? ?Chief Complaint  ?Patient presents with  ? Atrial Fibrillation  ? ? ?  ?History of Present Illness: ?Adam Richards is a 88 y.o. male who presents for evaluation of atrial fibrillation. He moved here from [place]. He has a long-standing history of permanent atrial fibrillation. He's been on anticoagulation for years. He had a mildly decreased LV function and moderately increased pulmonary pressures.   ? ?I did repeat an echocardiogram which suggested that his EF was Richards normal 50 to 55%.  He had moderately severe tricuspid regurgitation with moderately elevated pulmonary pressures.    ? ?Since I last saw him he is actually done well.  He has had none of the dizziness that he had last year when medically with the emergency room.  He does rarely feel his fibrillation.  He has not really pay much attention to it.  He does not have any presyncope or syncope.  He had no chest pressure, neck or arm discomfort.  He has been doing some exercises and is actually going to do some balance work.  He has not had any new shortness of breath, PND or orthopnea. ? ? ? ?Past Medical History:  ?Diagnosis Date  ? A-fib ([place])   ? Hypertension   ? ? ?Past Surgical History:  ?Procedure Laterality Date  ? KNEE SURGERY    ? "removed crystals"  ? TONSILLECTOMY AND ADENOIDECTOMY    ? UMBILICAL HERNIA REPAIR    ? ? ? ?Current Outpatient Medications  ?Medication Sig Dispense Refill  ? amLODipine (NORVASC) 2.5 MG tablet Take 1 tablet by mouth daily.    ? docusate calcium (SURFAK) 240 MG capsule Take 240 mg by mouth daily.    ? dutasteride (AVODART) 0.5 MG capsule Take 1 capsule by mouth daily.  2  ? furosemide (LASIX) 20 MG tablet Take 20 mg by mouth 2 (two) times daily.    ? lovastatin (MEVACOR) 20 MG tablet Take 20 mg by mouth daily.  1  ? Metoprolol Tartrate 37.5 MG  TABS Take 37.5 mg by mouth 2 (two) times daily. 180 tablet 3  ? potassium chloride (KLOR-CON) 10 MEQ tablet Take 10 mEq by mouth daily.    ? tamsulosin (FLOMAX) 0.4 MG CAPS capsule Take 1 capsule by mouth daily.    ? warfarin (COUMADIN) 2.5 MG tablet SMARTSIG:1 Tablet(s) By Mouth Every Evening    ? ?No current facility-administered medications for this visit.  ? ? ?Allergies:   Patient has no known allergies.  ? ? ?ROS:  Please see the history of present illness.   Otherwise, review of systems are positive for none.   All other systems are reviewed and negative.  ? ? ?PHYSICAL EXAM: ?VS:  BP (!) 144/90   Pulse 72   Ht '5\' 10"'$  (1.778 m)   Wt 194 lb (88 kg)   SpO2 98%   BMI 27.84 kg/m?  , BMI Body mass index is 27.84 kg/m?.  ?GENERAL:  Well appearing ?NECK: Positive jugular venous distention, to the neck at 45 degrees with a CV wave, waveform within normal limits, carotid upstroke brisk and symmetric, no bruits, no thyromegaly ?LUNGS:  Clear to auscultation bilaterally ?CHEST:  Unremarkable ?HEART:  PMI not displaced or sustained,S1 and S2 within normal limits, no S3, no S4, no clicks, no rubs,  3 out of 6 systolic murmur holosystolic, no diastolic murmurs ?ABD:  Flat, positive bowel sounds normal in frequency in pitch, no bruits, no rebound, no guarding, no midline pulsatile mass, no hepatomegaly, no splenomegaly ?EXT:  2 plus pulses throughout, no edema, no cyanosis no clubbing ? ? ?EKG:  EKG is  ordered today. ?Atrial fibrillation, rate 72, axis within normal limits, intervals within normal limits, Richards voltage in the limb leads, poor anterior R wave progression, no acute ST-T wave changes. ? ?Recent Labs: ?10/18/2020: ALT 26; B Natriuretic Peptide 334.8; BUN 15; Creatinine, Ser 0.81; Hemoglobin 11.7; Platelets 152; Potassium 3.9; Sodium 133  ? ? ?Lipid Panel ?No results found for: CHOL, TRIG, HDL, CHOLHDL, VLDL, LDLCALC, LDLDIRECT ?  ? ?Wt Readings from Last 3 Encounters:  ?08/18/21 194 lb (88 kg)  ?11/18/20 190  lb (86.2 kg)  ?10/18/20 190 lb (86.2 kg)  ?  ? ? ?Other studies Reviewed: ?Additional studies/ records that were reviewed today include:  Labs ?Review of the above records demonstrates: See elsewhere ? ? ?ASSESSMENT AND PLAN: ? ?ATRIAL FIB:  The patient has chronic atrial fibrillation. [name] has a CHA2DS2 - VASc score of 3 .  No change in therapy.    ? ?HTN: Blood pressure is mildly elevated.  He will keep an eye on this and if it does creep up I probably would go up on his amlodipine.  ? ?PULMONARY HTN:     He has some mild edema related to this but no other real symptoms and I am going to manage this conservatively.  ? ?DIZZINESS:   He is no longer having dizzy episodes such as described last year.  No further work-up. ? ? ?Current medicines are reviewed at length with the patient today.  The patient does not have concerns regarding medicines. ? ?The following changes have been made:  None ? ?Labs/ tests ordered today include:  None ? ?Orders Placed This Encounter  ?Procedures  ? EKG 12-Lead  ? ? ? ?Disposition:   FU one year.   ? ? ?Signed, ?Minus Breeding, MD  ?08/18/2021 5:35 PM    ?[place] ?

## 2021-08-18 ENCOUNTER — Other Ambulatory Visit: Payer: Self-pay

## 2021-08-18 ENCOUNTER — Ambulatory Visit (INDEPENDENT_AMBULATORY_CARE_PROVIDER_SITE_OTHER): Payer: Medicare Other | Admitting: Cardiology

## 2021-08-18 ENCOUNTER — Encounter: Payer: Self-pay | Admitting: Cardiology

## 2021-08-18 VITALS — BP 144/90 | HR 72 | Ht 70.0 in | Wt 194.0 lb

## 2021-08-18 DIAGNOSIS — I10 Essential (primary) hypertension: Secondary | ICD-10-CM | POA: Diagnosis not present

## 2021-08-18 DIAGNOSIS — E785 Hyperlipidemia, unspecified: Secondary | ICD-10-CM

## 2021-08-18 DIAGNOSIS — I272 Pulmonary hypertension, unspecified: Secondary | ICD-10-CM | POA: Diagnosis not present

## 2021-08-18 DIAGNOSIS — I482 Chronic atrial fibrillation, unspecified: Secondary | ICD-10-CM

## 2021-08-18 DIAGNOSIS — Z7901 Long term (current) use of anticoagulants: Secondary | ICD-10-CM | POA: Diagnosis not present

## 2021-08-18 NOTE — Patient Instructions (Signed)
Medication Instructions:  ?No changes ?*If you need a refill on your cardiac medications before your next appointment, please call your pharmacy* ? ? ?Lab Work: ?None ordered ?If you have labs (blood work) drawn today and your tests are completely normal, you will receive your results only by: ?MyChart Message (if you have MyChart) OR ?A paper copy in the mail ?If you have any lab test that is abnormal or we need to change your treatment, we will call you to review the results. ? ? ?Testing/Procedures: ?None ordered ? ? ?Follow-Up: ?At [facility], you and your health needs are our priority.  As part of our continuing mission to provide you with exceptional heart care, we have created designated Provider Care Teams.  These Care Teams include your primary Cardiologist (physician) and Advanced Practice Providers (APPs -  Physician Assistants and Nurse Practitioners) who all work together to provide you with the care you need, when you need it. ? ?We recommend signing up for the patient portal called "MyChart".  Sign up information is provided on this After Visit Summary.  MyChart is used to connect with patients for Virtual Visits (Telemedicine).  Patients are able to view lab/test results, encounter notes, upcoming appointments, etc.  Non-urgent messages can be sent to your provider as well.   ?To learn more about what you can do with MyChart, go to [email or web address].   ? ?Your next appointment:   ?12 month(s) ? ?The format for your next appointment:   ?In Person ? ?Provider:   ?James Hochrein, MD { ? ? ?

## 2021-08-24 ENCOUNTER — Ambulatory Visit
Admission: RE | Admit: 2021-08-24 | Discharge: 2021-08-24 | Disposition: A | Discharge status: Home / Self Care | Payer: Medicare Other | Source: Ambulatory Visit | Attending: Internal Medicine | Admitting: Internal Medicine

## 2021-08-24 ENCOUNTER — Other Ambulatory Visit: Payer: Self-pay | Admitting: Internal Medicine

## 2021-08-24 DIAGNOSIS — M25551 Pain in right hip: Secondary | ICD-10-CM | POA: Diagnosis not present

## 2021-08-24 DIAGNOSIS — I270 Primary pulmonary hypertension: Secondary | ICD-10-CM | POA: Diagnosis not present

## 2021-08-24 DIAGNOSIS — M25559 Pain in unspecified hip: Secondary | ICD-10-CM | POA: Diagnosis not present

## 2021-08-24 DIAGNOSIS — D6869 Other thrombophilia: Secondary | ICD-10-CM | POA: Diagnosis not present

## 2021-08-24 DIAGNOSIS — I700 Atherosclerosis of aorta: Secondary | ICD-10-CM | POA: Diagnosis not present

## 2021-08-24 DIAGNOSIS — M1611 Unilateral primary osteoarthritis, right hip: Secondary | ICD-10-CM | POA: Diagnosis not present

## 2021-08-24 DIAGNOSIS — I10 Essential (primary) hypertension: Secondary | ICD-10-CM | POA: Diagnosis not present

## 2021-08-24 DIAGNOSIS — I4891 Unspecified atrial fibrillation: Secondary | ICD-10-CM | POA: Diagnosis not present

## 2021-08-24 IMAGING — DX DG HIP (WITH OR WITHOUT PELVIS) 2-3V*R*
2 series · 2 of 2 positions shown · non-contrast
Comparison: None.

CLINICAL DATA: Chronic right hip pain

EXAM:
DG HIP (WITH OR WITHOUT PELVIS) 2-3V RIGHT

[dg hip unilat w or w/o pelvis 2-3 views  (1 of 2)]
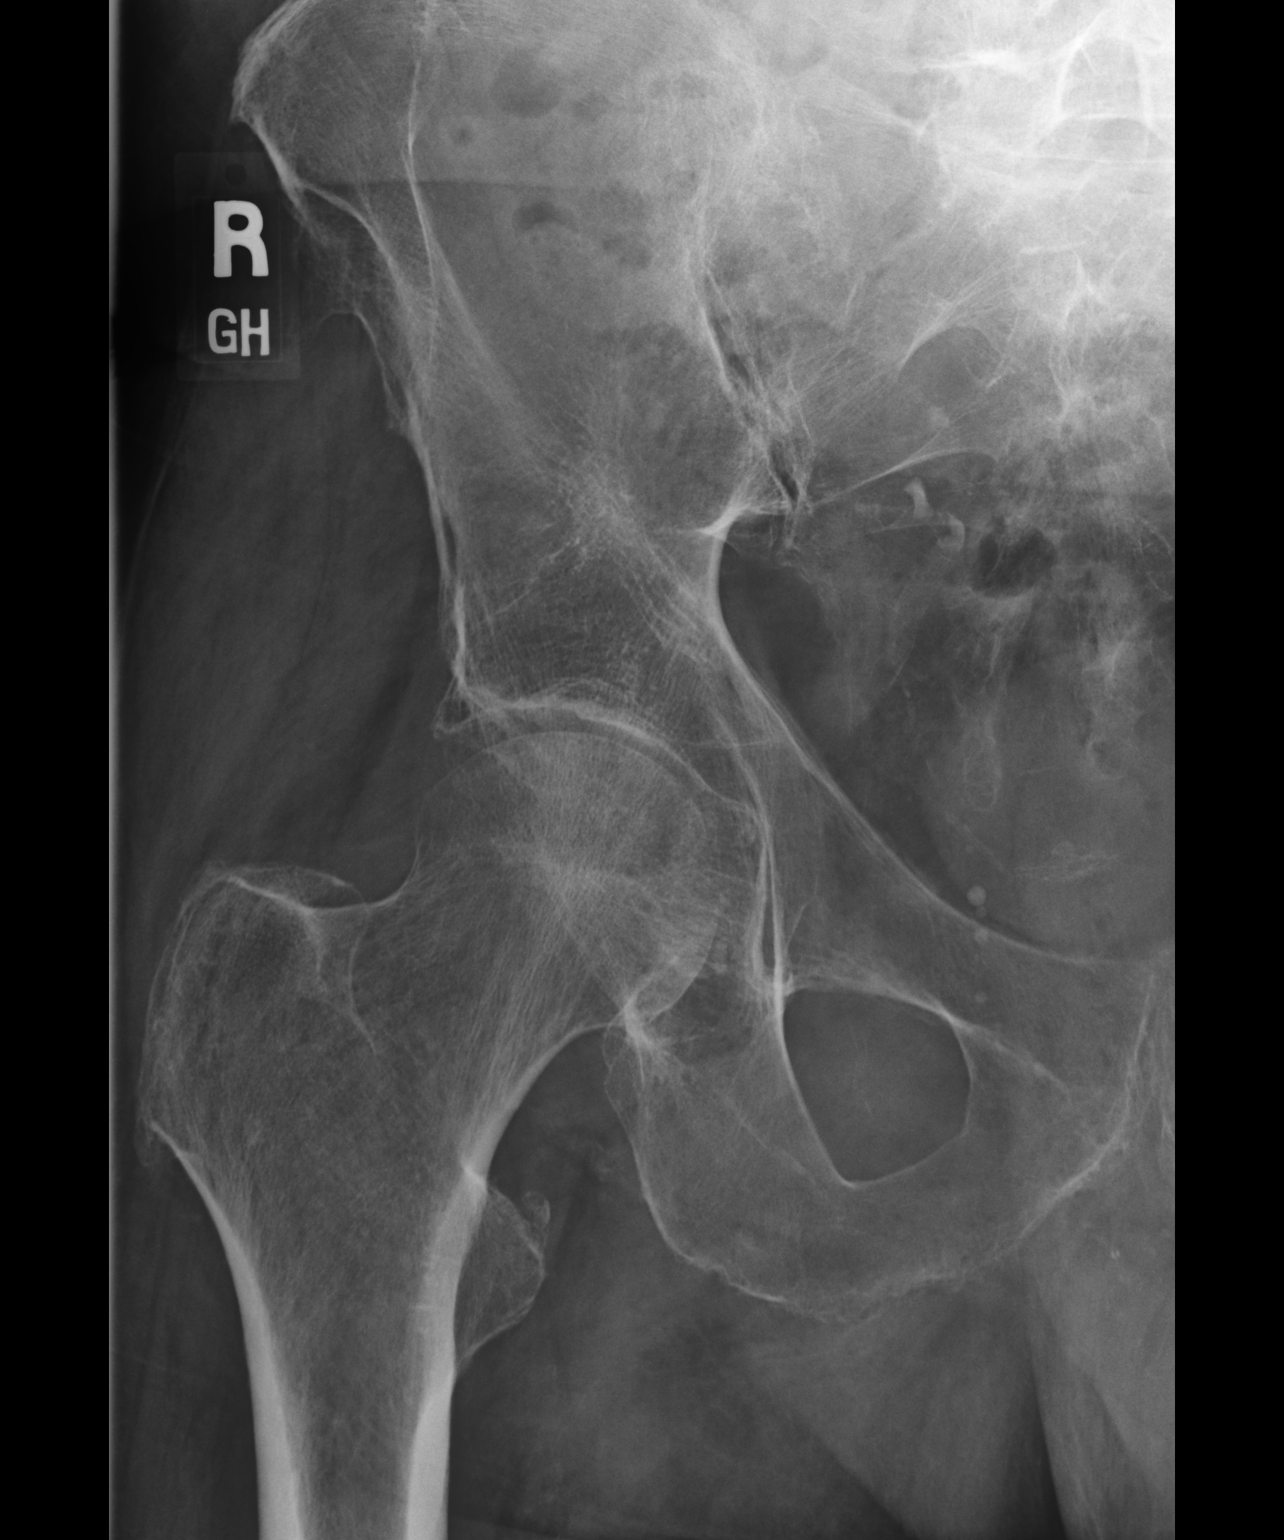

[dg hip unilat w or w/o pelvis 2-3 views  (2 of 2)]
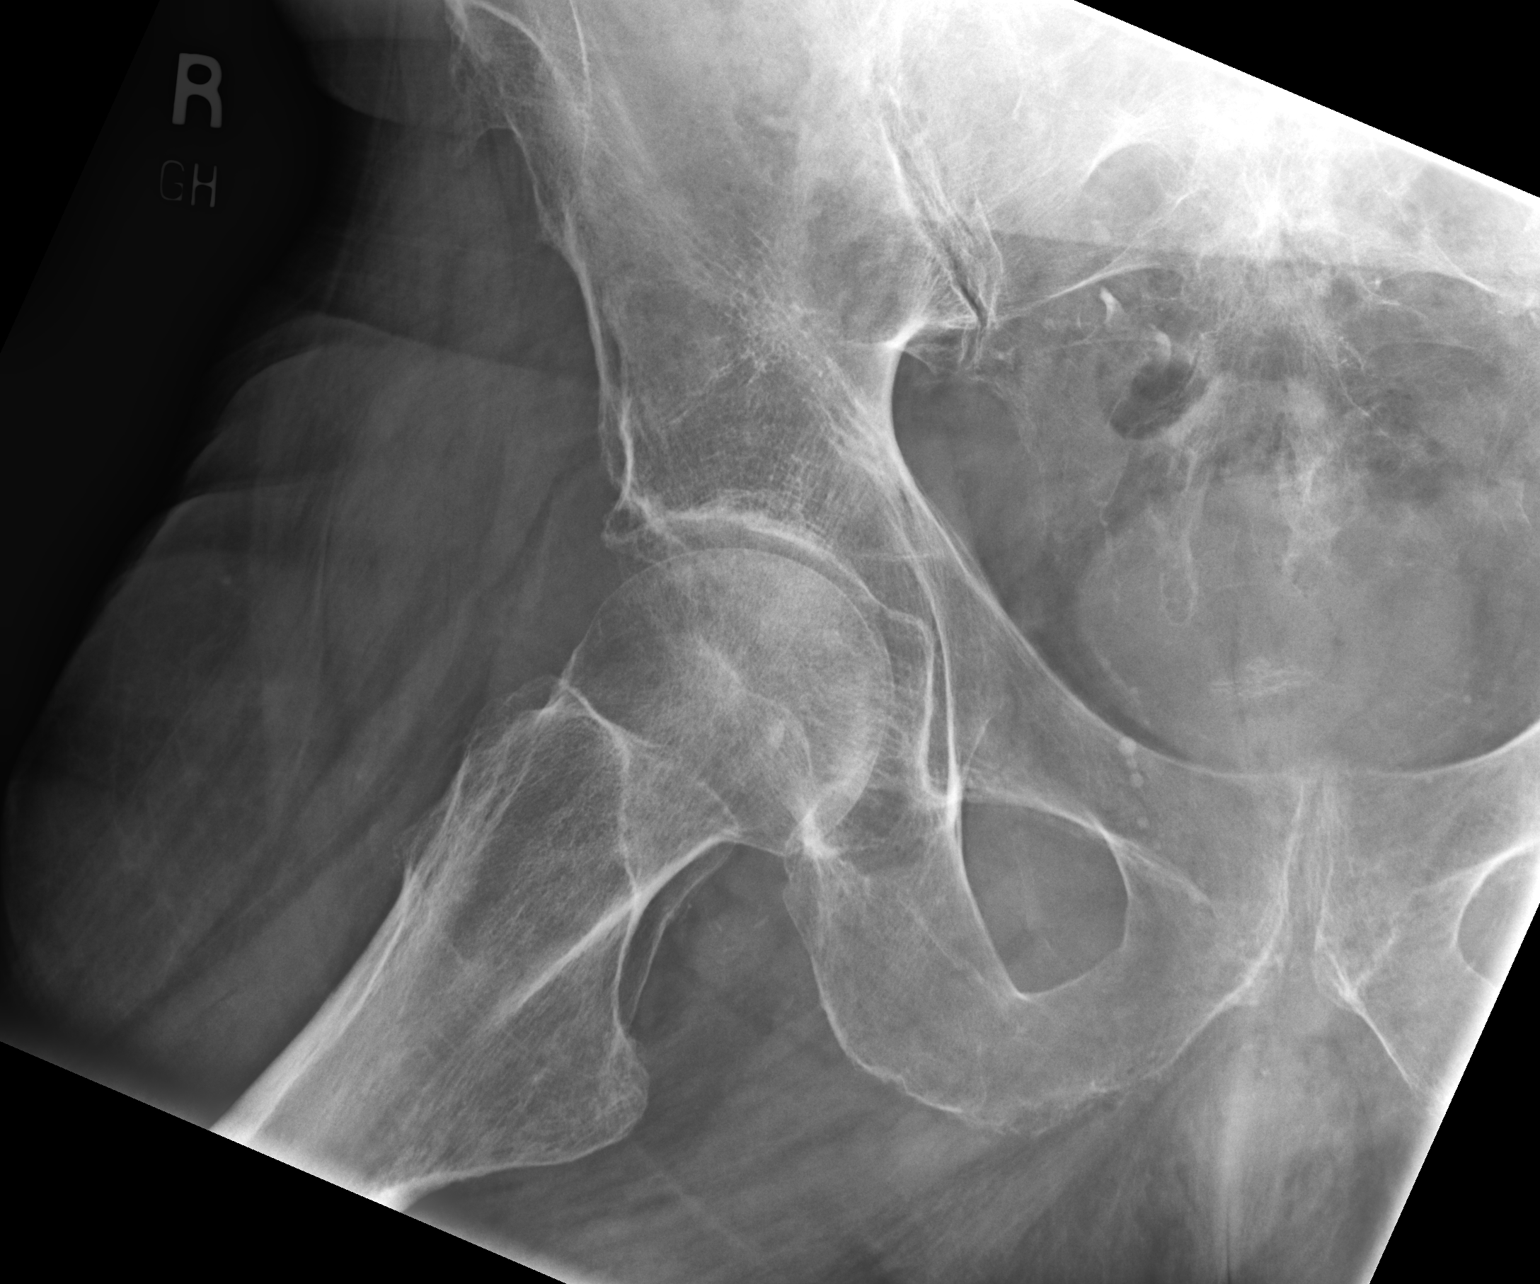

[2 of 2 positions shown; findings below may reference images not displayed]

FINDINGS: Frontal and frogleg lateral views of the right hip are obtained. No
fracture, subluxation, or dislocation. There is moderate joint space
narrowing and osteophyte formation. Visualized portions of the right
hemipelvis are unremarkable.
IMPRESSION: 1. Moderate right hip osteoarthritis.  No acute bony abnormality.

## 2021-08-25 ENCOUNTER — Emergency Department (HOSPITAL_BASED_OUTPATIENT_CLINIC_OR_DEPARTMENT_OTHER)
Admission: EM | Admit: 2021-08-25 | Discharge: 2021-08-25 | Disposition: A | Discharge status: Home / Self Care | Payer: Medicare Other | Attending: Emergency Medicine | Admitting: Emergency Medicine

## 2021-08-25 ENCOUNTER — Encounter (HOSPITAL_BASED_OUTPATIENT_CLINIC_OR_DEPARTMENT_OTHER): Payer: Self-pay | Admitting: Emergency Medicine

## 2021-08-25 ENCOUNTER — Other Ambulatory Visit: Payer: Self-pay

## 2021-08-25 DIAGNOSIS — Z23 Encounter for immunization: Secondary | ICD-10-CM | POA: Diagnosis not present

## 2021-08-25 DIAGNOSIS — S61213A Laceration without foreign body of left middle finger without damage to nail, initial encounter: Secondary | ICD-10-CM | POA: Diagnosis not present

## 2021-08-25 DIAGNOSIS — W260XXA Contact with knife, initial encounter: Secondary | ICD-10-CM | POA: Insufficient documentation

## 2021-08-25 DIAGNOSIS — Z7901 Long term (current) use of anticoagulants: Secondary | ICD-10-CM | POA: Insufficient documentation

## 2021-08-25 HISTORY — DX: Hyperlipidemia, unspecified: E78.5

## 2021-08-25 MED ORDER — LIDOCAINE HCL 2 % IJ SOLN
10.0000 mL | Freq: Once | INTRAMUSCULAR | Status: DC
  Filled 2021-08-25: qty 20

## 2021-08-25 MED ORDER — TETANUS-DIPHTH-ACELL PERTUSSIS 5-2.5-18.5 LF-MCG/0.5 IM SUSY
0.5000 mL | PREFILLED_SYRINGE | Freq: Once | INTRAMUSCULAR | Status: AC
  Administered 2021-08-25: 0.5 mL via INTRAMUSCULAR
  Filled 2021-08-25: qty 0.5

## 2021-08-25 NOTE — Discharge Instructions (Addendum)
Do not scrub your hands.  You may wash them with mild soap.  Your stitches must come out in 7 to 10 days.  The glue on top will likely for the next week however do not put Vaseline on Neosporin on top of the glue.  It may cause it to dissolve sooner.  Urgent care, primary care or another emergency department will be able to remove them. ? ?Information about laceration repairs is attached to these papers.  Please read them.  Return with any fevers, chills, loss of sensation in your hand or other worsening symptoms. ?

## 2021-08-25 NOTE — ED Provider Notes (Signed)
?[facility] EMERGENCY DEPT ?Provider Note ? ? ?CSN: [id number] ?Arrival date & time: 08/25/21  1510 ? ?  ? ?History ? ?Chief Complaint  ?Patient presents with  ? Laceration  ? ? ?Adam Richards is a 88 y.o. male presenting with a laceration to the dorsal left middle finger that he sustained while trying to cut something with his pocket knife earlier today.  Patient is on Coumadin for A-fib.  Denies any numbness or tingling.  Rates the pain at a 3 out of 10.  Unknown last tetanus ? ? ?Laceration ? ?  ? ?Home Medications ?Prior to Admission medications   ?Medication Sig Start Date End Date Taking? Authorizing Provider  ?amLODipine (NORVASC) 2.5 MG tablet Take 1 tablet by mouth daily.    [provider]  ?docusate calcium (SURFAK) 240 MG capsule Take 240 mg by mouth daily.    [provider]  ?dutasteride (AVODART) 0.5 MG capsule Take 1 capsule by mouth daily. 04/08/15   [provider]  ?furosemide (LASIX) 20 MG tablet Take 20 mg by mouth 2 (two) times daily. 10/11/19   [provider]  ?lovastatin (MEVACOR) 20 MG tablet Take 20 mg by mouth daily. 05/20/15   [provider]  ?Metoprolol Tartrate 37.5 MG TABS Take 37.5 mg by mouth 2 (two) times daily. 11/18/20 08/18/21  Minus Breeding, MD  ?potassium chloride (KLOR-CON) 10 MEQ tablet Take 10 mEq by mouth daily. 10/11/19   [provider]  ?tamsulosin (FLOMAX) 0.4 MG CAPS capsule Take 1 capsule by mouth daily. 05/12/15   [provider]  ?warfarin (COUMADIN) 2.5 MG tablet SMARTSIG:1 Tablet(s) By Mouth Every Evening 09/01/20   [provider]  ?   ? ?Allergies    ?Patient has no known allergies.   ? ?Review of Systems   ?Review of Systems ? ?Physical Exam ?Updated Vital Signs ?BP (!) 128/91 (BP Location: Right Arm)   Pulse 81   Temp 98.4 ?F (36.9 ?C)   Resp 16   Ht '5\' 10"'$  (1.778 m)   Wt 84.8 kg   SpO2 94%   BMI 26.83 kg/m?  ?Physical Exam ?Vitals and nursing note reviewed.   ?Constitutional:   ?   Appearance: Normal appearance.  ?HENT:  ?   Head: Normocephalic and atraumatic.  ?Eyes:  ?   General: No scleral icterus. ?   Conjunctiva/sclera: Conjunctivae normal.  ?Pulmonary:  ?   Effort: Pulmonary effort is normal. No respiratory distress.  ?Skin: ?   Findings: No rash.  ?   Comments: 2 cm laceration to the dorsal left middle finger.  Crosses the PIP.  Bleeding controlled with gauze dressing.  No exposed bone, nerves or vasculature  ?Neurological:  ?   Mental Status: He is alert.  ?Psychiatric:     ?   Mood and Affect: Mood normal.  ? ? ?ED Results / Procedures / Treatments   ?Labs ?(all labs ordered are listed, but only abnormal results are displayed) ?Labs Reviewed - No data to display ? ?EKG ?None ? ?Radiology ? ? ?Procedures ?Marland Kitchen.Laceration Repair ? ?Date/Time: 08/25/2021 4:55 PM ?Performed by: Rhae Hammock, PA-C ?Authorized by: Rhae Hammock, PA-C  ? ?Consent:  ?  Consent obtained:  Verbal ?  Consent given by:  Patient ?  Risks discussed:  Infection, need for additional repair, pain and poor wound healing ?  Alternatives discussed:  No treatment ?Universal protocol:  ?  Procedure explained and questions answered to patient or proxy's satisfaction: yes   ?  Patient identity confirmed:  Verbally with patient ?Anesthesia:  ?  Anesthesia method:  Nerve block ?  Block needle gauge:  25 G ?  Block anesthetic:  Lidocaine 2% w/o epi ?  Block technique:  Traditional ?  Block injection procedure:  Introduced needle and anatomic landmarks palpated ?  Block outcome:  Anesthesia achieved ?Laceration details:  ?  Location:  Finger ?  Finger location:  L long finger ?  Length (cm):  2 ?Exploration:  ?  Hemostasis achieved with:  Direct pressure ?  Imaging outcome: foreign body not noted   ?  Wound exploration: wound explored through full range of motion and entire depth of wound visualized   ?  Contaminated: no   ?Treatment:  ?  Area cleansed with:  Chlorhexidine and saline ?  Amount of  cleaning:  Standard ?  Irrigation solution:  Sterile saline ?  Irrigation volume:  200cc ?  Irrigation method:  Syringe ?  Visualized foreign bodies/material removed: no   ?  Debridement:  None ?  Undermining:  None ?  Scar revision: no   ?Skin repair:  ?  Repair method:  Sutures ?  Suture size:  6-0 ?  Suture material:  Prolene ?  Suture technique:  Simple interrupted ?  Number of sutures:  3 ?Approximation:  ?  Approximation:  Close ?Repair type:  ?  Repair type:  Simple ?Post-procedure details:  ?  Procedure completion:  Tolerated well, no immediate complications  ? ? ?Medications Ordered in ED ?Medications  ?lidocaine (XYLOCAINE) 2 % (with pres) injection 200 mg (has no administration in time range)  ?Tdap (BOOSTRIX) injection 0.5 mL (0.5 mLs Intramuscular Given 08/25/21 1613)  ? ? ?ED Course/ Medical Decision Making/ A&P ?  ?                        ?Medical Decision Making ?Risk ?Prescription drug management. ? ? ?Traditional digital block was performed successfully. 3 Prolene sutures placed.  Dermabond on top of the sutures as recommended by Dr. Gilford Raid.  Laceration repair without concerns. ? ?Patient is aware that the sutures need to come out in 7 to 10 days.  We discussed that the Dermabond will likely dissolve over the next 7 to 10 days.  He will not scrub the area and will return with any signs of infection.  Tetanus was updated.  Patient agreeable to discharge and thankful for his care. ? ?Final Clinical Impression(s) / ED Diagnoses ?Final diagnoses:  ?Laceration of left middle finger without foreign body without damage to nail, initial encounter  ? ? ?Rx / DC Orders ? ? ?Results and diagnoses were explained to the patient. Return precautions discussed in full. Patient had no additional questions and expressed complete understanding. ? ? ?This chart was dictated using voice recognition software.  Despite best efforts to proofread,  errors can occur which can change the documentation meaning.  ?  ?Rhae Hammock, PA-C ?08/25/21 1659 ? ?  ?Isla Pence, MD ?08/26/21 [phone] ? ?

## 2021-08-25 NOTE — ED Triage Notes (Signed)
Patient arrives ambulatory by POV states he was attempting to cut something with his pocket knife and cut his left middle finger. Patient on coumadin and last dose was last night.  ?

## 2021-08-27 DIAGNOSIS — L821 Other seborrheic keratosis: Secondary | ICD-10-CM | POA: Diagnosis not present

## 2021-08-27 DIAGNOSIS — D225 Melanocytic nevi of trunk: Secondary | ICD-10-CM | POA: Diagnosis not present

## 2021-08-27 DIAGNOSIS — L814 Other melanin hyperpigmentation: Secondary | ICD-10-CM | POA: Diagnosis not present

## 2021-08-27 DIAGNOSIS — L570 Actinic keratosis: Secondary | ICD-10-CM | POA: Diagnosis not present

## 2021-08-27 DIAGNOSIS — Z85828 Personal history of other malignant neoplasm of skin: Secondary | ICD-10-CM | POA: Diagnosis not present

## 2021-08-27 DIAGNOSIS — Z08 Encounter for follow-up examination after completed treatment for malignant neoplasm: Secondary | ICD-10-CM | POA: Diagnosis not present

## 2021-08-27 DIAGNOSIS — L708 Other acne: Secondary | ICD-10-CM | POA: Diagnosis not present

## 2021-09-03 DIAGNOSIS — Z4802 Encounter for removal of sutures: Secondary | ICD-10-CM | POA: Diagnosis not present

## 2021-09-18 DIAGNOSIS — N400 Enlarged prostate without lower urinary tract symptoms: Secondary | ICD-10-CM | POA: Diagnosis not present

## 2021-09-18 DIAGNOSIS — I10 Essential (primary) hypertension: Secondary | ICD-10-CM | POA: Diagnosis not present

## 2021-09-18 DIAGNOSIS — E7800 Pure hypercholesterolemia, unspecified: Secondary | ICD-10-CM | POA: Diagnosis not present

## 2021-09-18 DIAGNOSIS — I270 Primary pulmonary hypertension: Secondary | ICD-10-CM | POA: Diagnosis not present

## 2021-10-16 DIAGNOSIS — Z7901 Long term (current) use of anticoagulants: Secondary | ICD-10-CM | POA: Diagnosis not present

## 2021-11-12 DIAGNOSIS — Z7901 Long term (current) use of anticoagulants: Secondary | ICD-10-CM | POA: Diagnosis not present

## 2021-12-10 DIAGNOSIS — Z7901 Long term (current) use of anticoagulants: Secondary | ICD-10-CM | POA: Diagnosis not present

## 2021-12-13 ENCOUNTER — Other Ambulatory Visit: Payer: Self-pay

## 2021-12-13 ENCOUNTER — Inpatient Hospital Stay (HOSPITAL_COMMUNITY)
Admission: EM | Admit: 2021-12-13 | Discharge: 2021-12-18 | DRG: 522 | Disposition: A | Discharge status: Skilled Nursing Facility | Payer: Medicare Other | Attending: Internal Medicine | Admitting: Internal Medicine

## 2021-12-13 ENCOUNTER — Emergency Department (HOSPITAL_COMMUNITY): Payer: Medicare Other

## 2021-12-13 DIAGNOSIS — S82142A Displaced bicondylar fracture of left tibia, initial encounter for closed fracture: Secondary | ICD-10-CM

## 2021-12-13 DIAGNOSIS — M47816 Spondylosis without myelopathy or radiculopathy, lumbar region: Secondary | ICD-10-CM | POA: Diagnosis not present

## 2021-12-13 DIAGNOSIS — D7589 Other specified diseases of blood and blood-forming organs: Secondary | ICD-10-CM | POA: Diagnosis not present

## 2021-12-13 DIAGNOSIS — Z7901 Long term (current) use of anticoagulants: Secondary | ICD-10-CM

## 2021-12-13 DIAGNOSIS — S82145A Nondisplaced bicondylar fracture of left tibia, initial encounter for closed fracture: Secondary | ICD-10-CM | POA: Diagnosis present

## 2021-12-13 DIAGNOSIS — S82192A Other fracture of upper end of left tibia, initial encounter for closed fracture: Secondary | ICD-10-CM | POA: Diagnosis not present

## 2021-12-13 DIAGNOSIS — Z8042 Family history of malignant neoplasm of prostate: Secondary | ICD-10-CM | POA: Diagnosis not present

## 2021-12-13 DIAGNOSIS — K5900 Constipation, unspecified: Secondary | ICD-10-CM | POA: Diagnosis not present

## 2021-12-13 DIAGNOSIS — Z8781 Personal history of (healed) traumatic fracture: Secondary | ICD-10-CM

## 2021-12-13 DIAGNOSIS — M25062 Hemarthrosis, left knee: Secondary | ICD-10-CM | POA: Diagnosis not present

## 2021-12-13 DIAGNOSIS — W109XXA Fall (on) (from) unspecified stairs and steps, initial encounter: Secondary | ICD-10-CM | POA: Diagnosis present

## 2021-12-13 DIAGNOSIS — E785 Hyperlipidemia, unspecified: Secondary | ICD-10-CM | POA: Diagnosis not present

## 2021-12-13 DIAGNOSIS — D72829 Elevated white blood cell count, unspecified: Secondary | ICD-10-CM | POA: Diagnosis not present

## 2021-12-13 DIAGNOSIS — I10 Essential (primary) hypertension: Secondary | ICD-10-CM | POA: Diagnosis present

## 2021-12-13 DIAGNOSIS — M1611 Unilateral primary osteoarthritis, right hip: Secondary | ICD-10-CM | POA: Diagnosis not present

## 2021-12-13 DIAGNOSIS — R531 Weakness: Secondary | ICD-10-CM | POA: Diagnosis not present

## 2021-12-13 DIAGNOSIS — Z4789 Encounter for other orthopedic aftercare: Secondary | ICD-10-CM | POA: Diagnosis not present

## 2021-12-13 DIAGNOSIS — S72002D Fracture of unspecified part of neck of left femur, subsequent encounter for closed fracture with routine healing: Secondary | ICD-10-CM | POA: Diagnosis not present

## 2021-12-13 DIAGNOSIS — Z7401 Bed confinement status: Secondary | ICD-10-CM | POA: Diagnosis not present

## 2021-12-13 DIAGNOSIS — D638 Anemia in other chronic diseases classified elsewhere: Secondary | ICD-10-CM | POA: Diagnosis not present

## 2021-12-13 DIAGNOSIS — I482 Chronic atrial fibrillation, unspecified: Secondary | ICD-10-CM | POA: Diagnosis not present

## 2021-12-13 DIAGNOSIS — R791 Abnormal coagulation profile: Secondary | ICD-10-CM | POA: Diagnosis not present

## 2021-12-13 DIAGNOSIS — D696 Thrombocytopenia, unspecified: Secondary | ICD-10-CM | POA: Diagnosis not present

## 2021-12-13 DIAGNOSIS — N2889 Other specified disorders of kidney and ureter: Secondary | ICD-10-CM | POA: Diagnosis not present

## 2021-12-13 DIAGNOSIS — Z8249 Family history of ischemic heart disease and other diseases of the circulatory system: Secondary | ICD-10-CM

## 2021-12-13 DIAGNOSIS — S72002A Fracture of unspecified part of neck of left femur, initial encounter for closed fracture: Principal | ICD-10-CM

## 2021-12-13 DIAGNOSIS — W19XXXA Unspecified fall, initial encounter: Secondary | ICD-10-CM | POA: Diagnosis not present

## 2021-12-13 DIAGNOSIS — Z79899 Other long term (current) drug therapy: Secondary | ICD-10-CM | POA: Diagnosis not present

## 2021-12-13 DIAGNOSIS — Z96649 Presence of unspecified artificial hip joint: Secondary | ICD-10-CM

## 2021-12-13 DIAGNOSIS — Z4889 Encounter for other specified surgical aftercare: Secondary | ICD-10-CM | POA: Diagnosis not present

## 2021-12-13 DIAGNOSIS — N400 Enlarged prostate without lower urinary tract symptoms: Secondary | ICD-10-CM | POA: Diagnosis present

## 2021-12-13 DIAGNOSIS — M62838 Other muscle spasm: Secondary | ICD-10-CM | POA: Diagnosis not present

## 2021-12-13 DIAGNOSIS — E871 Hypo-osmolality and hyponatremia: Secondary | ICD-10-CM | POA: Diagnosis not present

## 2021-12-13 DIAGNOSIS — I4891 Unspecified atrial fibrillation: Secondary | ICD-10-CM | POA: Diagnosis not present

## 2021-12-13 DIAGNOSIS — Z823 Family history of stroke: Secondary | ICD-10-CM | POA: Diagnosis not present

## 2021-12-13 DIAGNOSIS — S72032A Displaced midcervical fracture of left femur, initial encounter for closed fracture: Principal | ICD-10-CM | POA: Diagnosis present

## 2021-12-13 DIAGNOSIS — Z96642 Presence of left artificial hip joint: Secondary | ICD-10-CM | POA: Diagnosis not present

## 2021-12-13 DIAGNOSIS — Z471 Aftercare following joint replacement surgery: Secondary | ICD-10-CM | POA: Diagnosis not present

## 2021-12-13 DIAGNOSIS — S82102A Unspecified fracture of upper end of left tibia, initial encounter for closed fracture: Secondary | ICD-10-CM | POA: Diagnosis not present

## 2021-12-13 DIAGNOSIS — M25552 Pain in left hip: Secondary | ICD-10-CM | POA: Diagnosis not present

## 2021-12-13 DIAGNOSIS — F17210 Nicotine dependence, cigarettes, uncomplicated: Secondary | ICD-10-CM | POA: Diagnosis not present

## 2021-12-13 DIAGNOSIS — S80919A Unspecified superficial injury of unspecified knee, initial encounter: Secondary | ICD-10-CM | POA: Diagnosis not present

## 2021-12-13 DIAGNOSIS — M25562 Pain in left knee: Secondary | ICD-10-CM | POA: Diagnosis not present

## 2021-12-13 DIAGNOSIS — S82142D Displaced bicondylar fracture of left tibia, subsequent encounter for closed fracture with routine healing: Secondary | ICD-10-CM | POA: Diagnosis not present

## 2021-12-13 LAB — CBC
HCT: 36.1 % — ABNORMAL LOW (ref 39.0–52.0)
Hemoglobin: 12 g/dL — ABNORMAL LOW (ref 13.0–17.0)
MCH: 33.9 pg (ref 26.0–34.0)
MCHC: 33.2 g/dL (ref 30.0–36.0)
MCV: 102 fL — ABNORMAL HIGH (ref 80.0–100.0)
Platelets: 156 10*3/uL (ref 150–400)
RBC: 3.54 MIL/uL — ABNORMAL LOW (ref 4.22–5.81)
RDW: 13.7 % (ref 11.5–15.5)
WBC: 10.2 10*3/uL (ref 4.0–10.5)
nRBC: 0 % (ref 0.0–0.2)

## 2021-12-13 LAB — BASIC METABOLIC PANEL
Anion gap: 8 (ref 5–15)
BUN: 20 mg/dL (ref 8–23)
CO2: 27 mmol/L (ref 22–32)
Calcium: 9 mg/dL (ref 8.9–10.3)
Chloride: 100 mmol/L (ref 98–111)
Creatinine, Ser: 0.81 mg/dL (ref 0.61–1.24)
GFR, Estimated: >60 mL/min (ref >60)
Glucose, Bld: 106 mg/dL — ABNORMAL HIGH (ref 70–99)
Potassium: 3.7 mmol/L (ref 3.5–5.1)
Sodium: 135 mmol/L (ref 135–145)

## 2021-12-13 LAB — PROTIME-INR
INR: 2.7 — ABNORMAL HIGH (ref 0.8–1.2)
Prothrombin Time: 28.1 seconds — ABNORMAL HIGH (ref 11.4–15.2)

## 2021-12-13 MED ORDER — BISACODYL 10 MG RE SUPP
10.0000 mg | Freq: Every day | RECTAL | Status: DC | PRN
  Administered 2021-12-15: 10 mg via RECTAL
  Filled 2021-12-13: qty 1

## 2021-12-13 MED ORDER — HYDROCODONE-ACETAMINOPHEN 5-325 MG PO TABS
1.0000 | ORAL_TABLET | Freq: Once | ORAL | Status: AC
  Administered 2021-12-13: 1 via ORAL
  Filled 2021-12-13: qty 1

## 2021-12-13 MED ORDER — METOPROLOL TARTRATE 25 MG PO TABS
25.0000 mg | ORAL_TABLET | Freq: Two times a day (BID) | ORAL | Status: DC
  Administered 2021-12-13 – 2021-12-16 (×6): 25 mg via ORAL
  Filled 2021-12-13 (×6): qty 1

## 2021-12-13 MED ORDER — TAMSULOSIN HCL 0.4 MG PO CAPS
0.4000 mg | ORAL_CAPSULE | Freq: Every day | ORAL | Status: DC
  Administered 2021-12-13 – 2021-12-18 (×6): 0.4 mg via ORAL
  Filled 2021-12-13 (×6): qty 1

## 2021-12-13 MED ORDER — WARFARIN SODIUM 2.5 MG PO TABS
2.5000 mg | ORAL_TABLET | Freq: Once | ORAL | Status: AC
  Administered 2021-12-13: 2.5 mg via ORAL
  Filled 2021-12-13: qty 1

## 2021-12-13 MED ORDER — POLYETHYLENE GLYCOL 3350 17 G PO PACK: 17.0000 g | PACK | Freq: Every day | ORAL | Status: DC | PRN

## 2021-12-13 MED ORDER — MORPHINE SULFATE (PF) 2 MG/ML IV SOLN
0.5000 mg | INTRAVENOUS | Status: DC | PRN
  Administered 2021-12-14 (×2): 0.5 mg via INTRAVENOUS
  Filled 2021-12-13 (×2): qty 1

## 2021-12-13 MED ORDER — DUTASTERIDE 0.5 MG PO CAPS
0.5000 mg | ORAL_CAPSULE | Freq: Every day | ORAL | Status: DC
  Administered 2021-12-13 – 2021-12-18 (×6): 0.5 mg via ORAL
  Filled 2021-12-13 (×6): qty 1

## 2021-12-13 MED ORDER — METHOCARBAMOL 500 MG PO TABS
500.0000 mg | ORAL_TABLET | Freq: Four times a day (QID) | ORAL | Status: DC | PRN
  Administered 2021-12-15: 500 mg via ORAL
  Filled 2021-12-13 (×2): qty 1

## 2021-12-13 MED ORDER — HYDROCODONE-ACETAMINOPHEN 5-325 MG PO TABS
1.0000 | ORAL_TABLET | Freq: Four times a day (QID) | ORAL | Status: DC | PRN
  Administered 2021-12-14 – 2021-12-15 (×3): 1 via ORAL
  Filled 2021-12-13 (×4): qty 1

## 2021-12-13 MED ORDER — SENNA 8.6 MG PO TABS
1.0000 | ORAL_TABLET | Freq: Two times a day (BID) | ORAL | Status: DC
Start: 2021-12-13 — End: 2021-12-18
  Administered 2021-12-13 – 2021-12-18 (×10): 8.6 mg via ORAL
  Filled 2021-12-13 (×10): qty 1

## 2021-12-13 MED ORDER — WARFARIN - PHYSICIAN DOSING INPATIENT: Freq: Every day | Status: DC

## 2021-12-13 MED ORDER — METHOCARBAMOL 1000 MG/10ML IJ SOLN: 500.0000 mg | Freq: Four times a day (QID) | INTRAVENOUS | Status: DC | PRN

## 2021-12-13 NOTE — ED Provider Notes (Signed)
Patient received an handout from [place].  See his note for full work-up.  In short patient is a 88 year old male with a hip fracture.  Preop labs ordered.  Ortho has already excepted the patient for fracture repair.  Will need to admit to hospitalist after labs.  Physical Exam  BP 121/84 (BP Location: Right Arm)   Pulse 62   Temp (!) 97.5 F (36.4 C) (Oral)   Resp 18   SpO2 97%   Physical Exam Vitals and nursing note reviewed.  Constitutional:      Appearance: Normal appearance.  HENT:     Head: Normocephalic and atraumatic.  Eyes:     General: No scleral icterus.    Conjunctiva/sclera: Conjunctivae normal.  Pulmonary:     Effort: Pulmonary effort is normal. No respiratory distress.  Skin:    Findings: No rash.  Neurological:     Mental Status: He is alert.  Psychiatric:        Mood and Affect: Mood normal.     Procedures  Procedures  ED Course / MDM    Medical Decision Making Amount and/or Complexity of Data Reviewed Labs: ordered. Radiology: ordered.  Risk Prescription drug management. Decision regarding hospitalization.   Lab work viewed and interpreted by me.  Pertinent findings include  INR 2.7 Macrocytic anemia with a hemoglobin of 12  Admitted to [place], [place], PA-C 12/13/21 1639    Luna Fuse, MD 12/13/21 2040

## 2021-12-13 NOTE — ED Provider Notes (Signed)
[facility] DEPT Provider Note   CSN: [id number] Arrival date & time: 12/13/21  1149     History  Chief Complaint  Patient presents with   Adam Richards is a 88 y.o. male with past medical history significant warfarin use due to chronic A-fib who presents with fall and injury to left knee with pain of left hip.  Patient reports that he did not hit his head, did not lose consciousness.  He has been taking his warfarin as directed.  He reports a throbbing pain of the left knee, as well as some pain radiating into the left hip.  He has not been able to bear weight since the injury.   Fall       Home Medications Prior to Admission medications   Medication Sig Start Date End Date Taking? Authorizing Provider  amLODipine (NORVASC) 2.5 MG tablet Take 1 tablet by mouth daily.   Yes [provider]  docusate calcium (SURFAK) 240 MG capsule Take 240 mg by mouth daily.   Yes [provider]  dutasteride (AVODART) 0.5 MG capsule Take 1 capsule by mouth daily. 04/08/15  Yes [provider]  furosemide (LASIX) 20 MG tablet Take 20 mg by mouth 2 (two) times daily. 10/11/19  Yes [provider]  lovastatin (MEVACOR) 20 MG tablet Take 20 mg by mouth daily. 05/20/15  Yes [provider]  metoprolol tartrate (LOPRESSOR) 25 MG tablet Take 25 mg by mouth 2 (two) times daily. 11/11/21  Yes [provider]  potassium chloride (KLOR-CON) 10 MEQ tablet Take 10 mEq by mouth daily. 10/11/19  Yes [provider]  tamsulosin (FLOMAX) 0.4 MG CAPS capsule Take 1 capsule by mouth daily. 05/12/15  Yes [provider]  warfarin (COUMADIN) 2.5 MG tablet SMARTSIG:1 Tablet(s) By Mouth Every Evening 09/01/20  Yes [provider]  Metoprolol Tartrate 37.5 MG TABS Take 37.5 mg by mouth 2 (two) times daily. Patient not taking: Reported on 12/13/2021 11/18/20 08/18/21  Minus Breeding, MD      Allergies     Patient has no known allergies.    Review of Systems   Review of Systems  Musculoskeletal:  Positive for arthralgias and gait problem.  All other systems reviewed and are negative.   Physical Exam Updated Vital Signs BP (!) 163/88   Pulse 99   Temp 97.8 F (36.6 C)   Resp (!) 21   SpO2 95%  Physical Exam Vitals and nursing note reviewed.  Constitutional:      General: He is not in acute distress.    Appearance: Normal appearance.  HENT:     Head: Normocephalic and atraumatic.  Eyes:     General:        Right eye: No discharge.        Left eye: No discharge.  Cardiovascular:     Rate and Rhythm: Normal rate and regular rhythm.     Pulses: Normal pulses.     Heart sounds: No murmur heard.    No friction rub. No gallop.  Pulmonary:     Effort: Pulmonary effort is normal.     Breath sounds: Normal breath sounds.  Abdominal:     General: Bowel sounds are normal.     Palpations: Abdomen is soft.  Musculoskeletal:     Comments: Patient with some tenderness palpation distal to right patella on lateral aspect of knee.  No significant joint effusion noted.  Decreased range of motion and strength actively  at the right knee and right hip, but intact range of motion passively.  Minimal tenderness palpation of the left hip on my exam.  There is a subtle shortened and externally rotated appearance of left hip.  Skin:    General: Skin is warm and dry.     Capillary Refill: Capillary refill takes less than 2 seconds.  Neurological:     Mental Status: He is alert and oriented to person, place, and time.  Psychiatric:        Mood and Affect: Mood normal.        Behavior: Behavior normal.     ED Results / Procedures / Treatments   Labs (all labs ordered are listed, but only abnormal results are displayed) Labs Reviewed  CBC - Abnormal; Notable for the following components:      Result Value   RBC 3.54 (*)    Hemoglobin 12.0 (*)    HCT 36.1 (*)    MCV 102.0 (*)    All  other components within normal limits  BASIC METABOLIC PANEL - Abnormal; Notable for the following components:   Glucose, Bld 106 (*)    All other components within normal limits  PROTIME-INR - Abnormal; Notable for the following components:   Prothrombin Time 28.1 (*)    INR 2.7 (*)    All other components within normal limits  CBC  BASIC METABOLIC PANEL  PROTIME-INR    EKG EKG Interpretation  Date/Time:  Sunday December 13 2021 17:24:15 EDT Ventricular Rate:  107 PR Interval:    QRS Duration: 104 QT Interval:  362 QTC Calculation: 481 R Axis:   58 Text Interpretation: Atrial fibrillation Ventricular premature complex Probable anterior infarct, age indeterminate Nonspecific ST and T wave abnormality No significant change was found Confirmed by Ezequiel Essex [phone]) on 12/13/2021 5:30:02 PM  Radiology CT PELVIS WO CONTRAST  Result Date: 12/13/2021 CLINICAL DATA:  Fall.  Clinical suspicion for fracture. EXAM: CT PELVIS WITHOUT CONTRAST TECHNIQUE: Multidetector CT imaging of the pelvis was performed following the standard protocol without intravenous contrast. RADIATION DOSE REDUCTION: This exam was performed according to the departmental dose-optimization program which includes automated exposure control, adjustment of the mA and/or kV according to patient size and/or use of iterative reconstruction technique. COMPARISON:  Current hip radiographs. Prior CT a chest, abdomen and pelvis from 05/14/2019. FINDINGS: Musculoskeletal: Acute fracture of the left femoral neck. Fracture extends obliquely across the neck, predominantly mid cervical, without significant displacement and without comminution. Mild apex anterior angulation. There is no significant varus or valgus angulation. No other fractures. Skeletal structures are diffusely demineralized. No bone lesion. Mild concentric right hip joint space narrowing. Left hip joint is normally spaced and aligned. Mild degenerative narrowing of the SI  joints. Pubic symphysis normally spaced and aligned. There are degenerative changes of the visualized lumbar spine. Urinary Tract: Lobulated, low-attenuation left renal mass with associated septa and wall calcifications, 5.5 cm in size, without change from the CT from 2020. No acute renal abnormalities. Ureters are unremarkable. Bladder mostly decompressed. Bowel:  No bowel dilation or inflammation. Vascular/Lymphatic: Aorto iliac atherosclerosis. No aneurysm. No enlarged lymph nodes. Reproductive:  Unremarkable. Other:  None. IMPRESSION: 1. Left femoral neck fracture, nondisplaced and non comminuted, with mild apex anterior angulation but no significant varus or valgus angulation. Electronically Signed   By: Lajean Manes M.D.   On: 12/13/2021 14:13   DG Hip Unilat W or Wo Pelvis 2-3 Views Left  Result Date: 12/13/2021 CLINICAL DATA:  Fall with  left hip pain. EXAM: DG HIP (WITH OR WITHOUT PELVIS) 2-3V LEFT COMPARISON:  None Available. FINDINGS: Bones are diffusely demineralized. SI joints and symphysis pubis unremarkable. No evidence for pubic ramus fracture. Although fracture line is not well demonstrated, there does appear to be a subcapsular transcervical left femoral neck fracture with mild varus angulation. IMPRESSION: Imaging features compatible with sub capsule or transcervical femoral neck fracture although assessment limited by bony demineralization. CT imaging could be used to further characterize as clinically warranted. Electronically Signed   By: Misty Stanley M.D.   On: 12/13/2021 13:31   DG Knee Complete 4 Views Left  Result Date: 12/13/2021 CLINICAL DATA:  Injury from a fall. Anterior left knee pain. Fell onto left knee walking upstairs. EXAM: LEFT KNEE - COMPLETE 4+ VIEW COMPARISON:  None Available. FINDINGS: There is diffuse decreased bone mineralization. Mild medial compartment joint space narrowing. Mild mediolateral compartment chondrocalcinosis. Mild peripheral medial compartment  degenerative osteophytosis. Small inferior greater than superior patellar degenerative osteophytes. No joint effusion. Apparent well corticated likely chronic ossicle at the anterior aspect of the proximal tibia on lateral view. No definite acute fracture is seen. No dislocation. Moderate vascular calcifications. IMPRESSION: Within the limitations of diffuse decreased bone mineralization, no acute fracture is seen. Electronically Signed   By: Yvonne Kendall M.D.   On: 12/13/2021 13:06    Procedures Procedures    Medications Ordered in ED Medications  HYDROcodone-acetaminophen (NORCO/VICODIN) 5-325 MG per tablet 1-2 tablet (has no administration in time range)  morphine (PF) 2 MG/ML injection 0.5 mg (has no administration in time range)  methocarbamol (ROBAXIN) tablet 500 mg (has no administration in time range)    Or  methocarbamol (ROBAXIN) 500 mg in dextrose 5 % 50 mL IVPB (has no administration in time range)  polyethylene glycol (MIRALAX / GLYCOLAX) packet 17 g (has no administration in time range)  senna (SENOKOT) tablet 8.6 mg (has no administration in time range)  bisacodyl (DULCOLAX) suppository 10 mg (has no administration in time range)  Metoprolol Tartrate TABS 37.5 mg (has no administration in time range)  dutasteride (AVODART) capsule 0.5 mg (has no administration in time range)  tamsulosin (FLOMAX) capsule 0.4 mg (has no administration in time range)  warfarin (COUMADIN) tablet 2.5 mg (has no administration in time range)  HYDROcodone-acetaminophen (NORCO/VICODIN) 5-325 MG per tablet 1 tablet (1 tablet Oral Given 12/13/21 1329)  HYDROcodone-acetaminophen (NORCO/VICODIN) 5-325 MG per tablet 1 tablet (1 tablet Oral Given 12/13/21 1639)    ED Course/ Medical Decision Making/ A&P                           Medical Decision Making Amount and/or Complexity of Data Reviewed Labs: ordered. Radiology: ordered.  Risk Prescription drug management. Decision regarding  hospitalization.   This is an overall well appearing 88 year old male who is anticoagulated on warfarin due to chronic afib. He presents with injury to left knee and reported radiation of hip pain. He is non weight-bearing. Social determinants of health: advanced age and anticoagulation. His left leg is neurovascularly intact on my exam but with deformity of shortened and externally rotated appearance suggesting possible hip fracture. Swelling noted to knee but intact ROM, no evidence of traumatic hemarthrosis. Compartments soft.  I independently interpreted imaging including plainfilm xray of left knee, hip, ct of pelivs which shows femoral neck fracture with mild angulation without significant comminuted or displaced appearance. I agree with the radiologist interpretation.  I consulted  with Dr. Alvan Dame the on-call orthopedic doctor who after reviewing patient's case and imaging suggests surgical repair Monday or Tuesday after INR control.  Administered norco in ED to help manage patient's pain. Reassessment showed improvement of pain.  3:30p Care of Keaston Pile transferred to [place] and Dr. Almyra Free at the end of my shift as the patient will require reassessment once labs/imaging have resulted. Patient presentation, ED course, and plan of care discussed with review of all pertinent labs and imaging. Please see his/her note for further details regarding further ED course and disposition. Plan at time of handoff is patient pending BMP, CBC, PT/INR prior to ability to consult for hospital admission. This may be altered or completely changed at the discretion of the oncoming team pending results of further workup.   Final Clinical Impression(s) / ED Diagnoses Final diagnoses:  Closed fracture of left hip, initial encounter [facility])    Rx / DC Orders ED Discharge Orders     None         Dorien Chihuahua 12/13/21 1837    Daleen Bo, MD 12/14/21 [phone]

## 2021-12-13 NOTE — Assessment & Plan Note (Signed)
The patient will be continued on Avodart and Flomax as he takes them at home.

## 2021-12-13 NOTE — ED Triage Notes (Signed)
Patient BIBA d/t witnessed fall while walking up stairs and falling on L knee. Pt unable to wear weight on extremity. Pt on Warfarin for afib. Pt Aox4.  BP 130/80 HR 68 RR 16 CBG 132

## 2021-12-13 NOTE — Assessment & Plan Note (Addendum)
The patient takes coumadin 2.5 mg daily for stroke prophylaxis. His INR today is 3.1. Plan is for surgery tomorrow. Coumadin is being held. The patient was given 2 mg vitamin K this morning. Will recheck INR at 1800.

## 2021-12-13 NOTE — Assessment & Plan Note (Addendum)
Blood pressures have normalized today. Will continue the patient on metoprolol and adjust as necessary.

## 2021-12-13 NOTE — Assessment & Plan Note (Addendum)
Rate controlled on metoprolol 37.5 bid. Monitor on telemetry. Coumadin is now held and Vitamin K has been given to reduce INR.

## 2021-12-13 NOTE — Assessment & Plan Note (Signed)
Continue statin as at home.

## 2021-12-13 NOTE — Assessment & Plan Note (Signed)
Orthopedic surgery has been consulted. They do not know when they plan to take patient to surgery (tomorrow or Tuesday). For this reason the patient has been continued on coumadin for today.

## 2021-12-13 NOTE — H&P (Signed)
History and Physical    Patient: Adam Richards [id number]:[id number] DOB: 12/11/1926 DOA: 12/13/2021 DOS: the patient was seen and examined on 12/13/2021 PCP: Wenda Low, MD  Patient coming from: Home  Chief Complaint:  Chief Complaint  Patient presents with   Fall   HPI: Adam Richards is a 88 y.o. male with medical history significant of atrial fibrillation, chronic anticoagulation, hypertension, hyperlipidemia, and benign prostatic hypertrophy. He presented to [name] ED by ambulance from home after a fall while walking up stairs. He fell on his left knee. He was unable to bear weight on the extremity following the fall.  In the ED the patient was found to have a left hip fracture. Vital signs are within normal limits with the exception of hypertension likely complicated by pain. Review of Systems: As mentioned in the history of present illness. All other systems reviewed and are negative. Past Medical History:  Diagnosis Date   A-fib ([place])    Hyperlipidemia    Hypertension    Past Surgical History:  Procedure Laterality Date   KNEE SURGERY     "removed crystals"   TONSILLECTOMY AND ADENOIDECTOMY     UMBILICAL HERNIA REPAIR     Social History:  reports that he has been smoking cigarettes and cigars. He has never used smokeless tobacco. He reports that he does not drink alcohol and does not use drugs.  No Known Allergies  Family History  Problem Relation Age of Onset   Heart failure Mother    Cancer Father    Prostate cancer Father    Cancer Brother    Stroke Maternal Grandmother    Tuberculosis Maternal Grandfather     Prior to Admission medications   Medication Sig Start Date End Date Taking? Authorizing Provider  amLODipine (NORVASC) 2.5 MG tablet Take 1 tablet by mouth daily.    [provider]  docusate calcium (SURFAK) 240 MG capsule Take 240 mg by mouth daily.    [provider]  dutasteride (AVODART) 0.5 MG capsule Take 1 capsule by mouth daily. 04/08/15    [provider]  furosemide (LASIX) 20 MG tablet Take 20 mg by mouth 2 (two) times daily. 10/11/19   [provider]  lovastatin (MEVACOR) 20 MG tablet Take 20 mg by mouth daily. 05/20/15   [provider]  Metoprolol Tartrate 37.5 MG TABS Take 37.5 mg by mouth 2 (two) times daily. 11/18/20 08/18/21  Minus Breeding, MD  potassium chloride (KLOR-CON) 10 MEQ tablet Take 10 mEq by mouth daily. 10/11/19   [provider]  tamsulosin (FLOMAX) 0.4 MG CAPS capsule Take 1 capsule by mouth daily. 05/12/15   [provider]  warfarin (COUMADIN) 2.5 MG tablet SMARTSIG:1 Tablet(s) By Mouth Every Evening 09/01/20   [provider]    Physical Exam: Vitals:   12/13/21 1159 12/13/21 1520 12/13/21 1600  BP: 121/84 136/85 (!) 170/95  Pulse: 62 63 99  Resp: '18 19 19  '$ Temp: (!) 97.5 F (36.4 C) 97.8 F (36.6 C)   TempSrc: Oral    SpO2: 97% 99% 94%   Exam:  Constitutional:  The patient is awake, alert, and oriented x 3. No acute distress. Eyes:  pupils and irises appear normal Normal lids and conjunctivae ENMT:  grossly normal hearing  Lips appear normal external ears, nose appear normal Oropharynx: mucosa, tongue,posterior pharynx appear normal Neck:  neck appears normal, no masses, normal ROM, supple no thyromegaly Respiratory:  No increased work of breathing. No wheezes, rales, or rhonchi No tactile  fremitus Cardiovascular:  Regular rate and rhythm No murmurs, ectopy, or gallups. No lateral PMI. No thrills. Abdomen:  Abdomen is soft, non-tender, non-distended No hernias, masses, or organomegaly Normoactive bowel sounds.  Musculoskeletal:  No cyanosis, clubbing, or edema Skin:  No rashes, lesions, ulcers palpation of skin: no induration or nodules Neurologic:  CN 2-12 intact Sensation all 4 extremities intact Psychiatric:  Mental status Mood, affect appropriate Orientation to person, place, time  judgment and insight appear  intact  Data Reviewed:  X-ray pelvis X-ray knee CBC BMP INR  Assessment and Plan: Assessment and Plan: * S/p left hip fracture Orthopedic surgery has been consulted. They do not know when they plan to take patient to surgery (tomorrow or Tuesday). For this reason the patient has been continued on coumadin for today.  Chronic anticoagulation The patient takes coumadin 2.5 mg daily for stroke prophylaxis. His INR today is 2.9. As it is not clear when surgery will take place, I will continue his coumadin today.  BPH (benign prostatic hyperplasia) The patient will be continued on Avodart and Flomax as he takes them at home.   Dyslipidemia Continue statin as at home.  Essential hypertension Blood pressures are high in the ED, but this is likely due to pain. Will continue the patient on metoprolol and adjust as necessary.  Atrial fibrillation, chronic (HCC) Rate controlled on metoprolol 37.5 bid. Monitor on telemetry.    I have seen and examined this patient myself. I have spent 68 minutes in his evaluation and treatment.   Advance Care Planning:   Code Status: Full Code   Consults: Orthopedic surgery  Family Communication: None available  Severity of Illness: The appropriate patient status for this patient is INPATIENT. Inpatient status is judged to be reasonable and necessary in order to provide the required intensity of service to ensure the patient's safety. The patient's presenting symptoms, physical exam findings, and initial radiographic and laboratory data in the context of their chronic comorbidities is felt to place them at high risk for further clinical deterioration. Furthermore, it is not anticipated that the patient will be medically stable for discharge from the hospital within 2 midnights of admission.   * I certify that at the point of admission it is my clinical judgment that the patient will require inpatient hospital care spanning beyond 2 midnights from the  point of admission due to high intensity of service, high risk for further deterioration and high frequency of surveillance required.*  Author: Filemon Breton, DO 12/13/2021 5:12 PM  For on call review [email or web address].

## 2021-12-14 ENCOUNTER — Encounter (HOSPITAL_COMMUNITY): Payer: Self-pay | Admitting: Internal Medicine

## 2021-12-14 ENCOUNTER — Inpatient Hospital Stay (HOSPITAL_COMMUNITY): Payer: Medicare Other

## 2021-12-14 DIAGNOSIS — S82142A Displaced bicondylar fracture of left tibia, initial encounter for closed fracture: Secondary | ICD-10-CM

## 2021-12-14 LAB — CBC
HCT: 31.8 % — ABNORMAL LOW (ref 39.0–52.0)
Hemoglobin: 10.8 g/dL — ABNORMAL LOW (ref 13.0–17.0)
MCH: 34.5 pg — ABNORMAL HIGH (ref 26.0–34.0)
MCHC: 34 g/dL (ref 30.0–36.0)
MCV: 101.6 fL — ABNORMAL HIGH (ref 80.0–100.0)
Platelets: 128 10*3/uL — ABNORMAL LOW (ref 150–400)
RBC: 3.13 MIL/uL — ABNORMAL LOW (ref 4.22–5.81)
RDW: 14 % (ref 11.5–15.5)
WBC: 7.8 10*3/uL (ref 4.0–10.5)
nRBC: 0 % (ref 0.0–0.2)

## 2021-12-14 LAB — BASIC METABOLIC PANEL
Anion gap: 6 (ref 5–15)
BUN: 21 mg/dL (ref 8–23)
CO2: 26 mmol/L (ref 22–32)
Calcium: 8.7 mg/dL — ABNORMAL LOW (ref 8.9–10.3)
Chloride: 103 mmol/L (ref 98–111)
Creatinine, Ser: 0.86 mg/dL (ref 0.61–1.24)
GFR, Estimated: >60 mL/min (ref >60)
Glucose, Bld: 122 mg/dL — ABNORMAL HIGH (ref 70–99)
Potassium: 4.3 mmol/L (ref 3.5–5.1)
Sodium: 135 mmol/L (ref 135–145)

## 2021-12-14 LAB — PROTIME-INR
INR: 3.1 — ABNORMAL HIGH (ref 0.8–1.2)
INR: 1.9 — ABNORMAL HIGH (ref 0.8–1.2)
Prothrombin Time: 31.5 seconds — ABNORMAL HIGH (ref 11.4–15.2)
Prothrombin Time: 22 s — ABNORMAL HIGH (ref 11.4–15.2)

## 2021-12-14 MED ORDER — ENSURE ENLIVE PO LIQD
237.0000 mL | Freq: Two times a day (BID) | ORAL | Status: DC
  Administered 2021-12-14 – 2021-12-18 (×5): 237 mL via ORAL

## 2021-12-14 MED ORDER — ADULT MULTIVITAMIN W/MINERALS CH
1.0000 | ORAL_TABLET | Freq: Every day | ORAL | Status: DC
  Administered 2021-12-14 – 2021-12-18 (×4): 1 via ORAL
  Filled 2021-12-14 (×4): qty 1

## 2021-12-14 MED ORDER — ORAL CARE MOUTH RINSE: 15.0000 mL | OROMUCOSAL | Status: DC | PRN

## 2021-12-14 MED ORDER — DEXTROSE 5 % IV SOLN
2.0000 mg | Freq: Once | INTRAVENOUS | Status: AC
  Administered 2021-12-14: 2 mg via INTRAVENOUS
  Filled 2021-12-14: qty 0.2

## 2021-12-14 NOTE — Progress Notes (Signed)
Patient ID: Adam Richards, male   DOB: [birth year], 88 y.o.   MRN: [id number]  Based on the mechanism of injury and persistent knee pain a CT scan was ordered.  CLINICAL DATA:  Status post fall yesterday, left knee pain   EXAM: CT OF THE LEFT KNEE WITHOUT CONTRAST   TECHNIQUE: Multidetector CT imaging of the left knee was performed according to the standard protocol. Multiplanar CT image reconstructions were also generated.   RADIATION DOSE REDUCTION: This exam was performed according to the departmental dose-optimization program which includes automated exposure control, adjustment of the mA and/or kV according to patient size and/or use of iterative reconstruction technique.   COMPARISON:  12/13/2021   FINDINGS: Bones/Joint/Cartilage   Severe osteopenia.   Nondisplaced fracture of the medial tibial plateau involving the articular surface with 1 mm of depression of the central articular surface. No other fracture or dislocation. Large lipohemarthrosis.   Mild osteoarthritis of the patellofemoral compartment. 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 Small loose bodies in the posterior joint space and along the anterior margin of the medial tibia.   Ligaments   Ligaments are suboptimally evaluated by CT.   Muscles and Tendons Muscles are normal. No muscle atrophy. No intramuscular fluid collection or hematoma. Quadriceps tendon and patellar tendon are intact.   Soft tissue No fluid collection or hematoma. No soft tissue mass. Peripheral vascular atherosclerotic disease.   IMPRESSION: 1. Acute nondisplaced fracture of the medial tibial plateau involving the articular surface with 1 mm of depression of the central articular surface. 2. Large lipohemarthrosis.     Electronically Signed   By: Kathreen Devoid M.D.  After addressing his left hip with hemiarthroplasty we will aspirate his knee to try and decompress the  lipohemarthrosis He will need to be limited weight bearing on the LLE as a result of the findings above.

## 2021-12-14 NOTE — Progress Notes (Signed)
PROGRESS NOTE  Adam Richards [id number] DOB: Sep 29, 1926 DOA: 12/13/2021 PCP: Wenda Low, MD  Brief History    Adam Richards is a 88 y.o. male with medical history significant of atrial fibrillation, chronic anticoagulation, hypertension, hyperlipidemia, and benign prostatic hypertrophy. He presented to [facility] ED by ambulance from home after a fall while walking up stairs. He fell on his left knee. He was unable to bear weight on the extremity following the fall.   In the ED the patient was found to have a left hip fracture. Vital signs are within normal limits with the exception of hypertension likely complicated by pain.  The patient has been admitted to a telemetry bed. CT of the left knee this morning demonstrates a fracture of the tibial plateau, lipohemarthrosis, and a 1 mm depression at the site of the fracture.   Orthopedic surgery will take the patient to the OR tomorrow. The patient's coumadin has been held, and he has been given 2 mg of IV vitamin K. Will recheck INR at 1800.  Consultants  Orthopedic surgery  Procedures  None  Antibiotics   Anti-infectives (From admission, onward)    None      Subjective  The patient is resting comfortably. No new complaints.  Objective   Vitals:  Vitals:   12/14/21 0803 12/14/21 1336  BP: 125/78 121/75  Pulse: 83 69  Resp: 16 18  Temp: 97.8 F (36.6 C) 97.9 F (36.6 C)  SpO2: 96% 94%    Exam:  Constitutional:  The patient is awake, alert, and oriented x 3. No acute distress. Respiratory:  No increased work of breathing. No wheezes, rales, or rhonchi No tactile fremitus Cardiovascular:  Regular rate and rhythm No murmurs, ectopy, or gallups. No lateral PMI. No thrills. Abdomen:  Abdomen is soft, non-tender, non-distended No hernias, masses, or organomegaly Normoactive bowel sounds.  Musculoskeletal:  No cyanosis, clubbing, or edema Skin:  No rashes, lesions, ulcers palpation of skin: no induration or  nodules Neurologic:  CN 2-12 intact Sensation all 4 extremities intact Psychiatric:  Mental status Mood, affect appropriate Orientation to person, place, time  judgment and insight appear intact   I have personally reviewed the following:   Today's Data  Vitals  Lab Data  CBC BMP  Micro Data    Imaging  X-ray pelvis X-ray left knee CT left knee  Cardiology Data  EKG  Other Data    Scheduled Meds:  dutasteride  0.5 mg Oral Daily   feeding supplement  237 mL Oral BID BM   metoprolol tartrate  25 mg Oral BID   multivitamin with minerals  1 tablet Oral Daily   senna  1 tablet Oral BID   tamsulosin  0.4 mg Oral Daily   Warfarin - Physician Dosing Inpatient   Does not apply q1600   Continuous Infusions:  methocarbamol (ROBAXIN) IV      Principal Problem:   S/p left hip fracture Active Problems:   Atrial fibrillation, chronic (HCC)   Essential hypertension   Dyslipidemia   BPH (benign prostatic hyperplasia)   Chronic anticoagulation   LOS: 1 day   A & P  Assessment and Plan: * S/p left hip fracture Orthopedic surgery has been consulted. They do not know when they plan to take patient to surgery (tomorrow or Tuesday). For this reason the patient has been continued on coumadin for today.  Tibial plateau fracture, left CT of the left knee has demonstrated : 1. Acute nondisplaced fracture of the medial tibial plateau involving  the articular surface with 1 mm of depression of the central articular surface. 2. Large lipohemarthrosis.  Chronic anticoagulation The patient takes coumadin 2.5 mg daily for stroke prophylaxis. His INR today is 3.1. Plan is for surgery tomorrow. Coumadin is being held. The patient was given 2 mg vitamin K this morning. Will recheck INR at 1800.  BPH (benign prostatic hyperplasia) The patient will be continued on Avodart and Flomax as he takes them at home.   Dyslipidemia Continue statin as at home.  Essential  hypertension Blood pressures have normalized today. Will continue the patient on metoprolol and adjust as necessary.  Atrial fibrillation, chronic (HCC) Rate controlled on metoprolol 37.5 bid. Monitor on telemetry. Coumadin is now held and Vitamin K has been given to reduce INR.  I have seen and examined this patient myself. I have spent 34 minutes in his evaluation and care.  DVT prophylaxis: Coumadin/held today. INR 3.1. Code Status: Full code Family Communication: None available Disposition Plan: SNF    Adan Beal, DO [facility] Direct contact: see [email or web address]  7PM-7AM contact night coverage as above 12/14/2021, 4:21 PM  LOS: 1 day

## 2021-12-14 NOTE — TOC Initial Note (Signed)
Transition of Care [facility]) - Initial/Assessment Note    Patient Details  Name: Adam Richards MRN: [id number] Date of Birth: 15-Oct-1926  Transition of [facility]) CM/SW Contact:    Dessa Phi, RN Phone Number: 12/14/2021, 3:32 PM  Clinical Narrative: From [facility]-for ortho sx in am. Await PT eval & recc. Patient agree to SNF if recc.                  Expected Discharge Plan: Skilled Nursing Facility Barriers to Discharge: Continued Medical Work up   Patient Goals and CMS Choice Patient states their goals for this hospitalization and ongoing recovery are:: Rehab CMS Medicare.gov Compare Post Acute Care list provided to:: Patient (Christina dtr) Choice offered to / list presented to : Adult Children  Expected Discharge Plan and Services Expected Discharge Plan: [place]   Discharge Planning Services: CM Consult   Living arrangements for the past 2 months: [place]                                      Prior Living Arrangements/Services Living arrangements for the past 2 months: [place] Lives with:: Self Patient language and need for interpreter reviewed:: Yes Do you feel safe going back to the place where you live?: Yes      Need for Family Participation in Patient Care: Yes (Comment) Care giver support system in place?: Yes (comment) Current home services: DME (rw) Criminal Activity/Legal Involvement Pertinent to Current Situation/Hospitalization: No - Comment as needed  Activities of Daily Living Home Assistive Devices/Equipment: Walker (specify type) ADL Screening (condition at time of admission) Patient's cognitive ability adequate to safely complete daily activities?: Yes Is the patient deaf or have difficulty hearing?: Yes Does the patient have difficulty seeing, even when wearing glasses/contacts?: No Does the patient have difficulty concentrating, remembering, or making decisions?:  No Patient able to express need for assistance with ADLs?: Yes Does the patient have difficulty dressing or bathing?: No Independently performs ADLs?: Yes (appropriate for developmental age) Does the patient have difficulty walking or climbing stairs?: Yes Weakness of Legs: Both Weakness of Arms/Hands: None  Permission Sought/Granted Permission sought to share information with : Case Manager Permission granted to share information with : Yes, Verbal Permission Granted  Share Information with NAME: Case manager           Emotional Assessment Appearance:: Appears stated age Attitude/Demeanor/Rapport: Gracious Affect (typically observed): Accepting Orientation: : Oriented to Self, Oriented to Place, Oriented to  Time, Oriented to Situation Alcohol / Substance Use: Not Applicable Psych Involvement: No (comment)  Admission diagnosis:  Closed fracture of left hip, initial encounter ([place]) [S72.002A] S/p left hip fracture [Z87.81] Patient Active Problem List   Diagnosis Date Noted   S/p left hip fracture 12/13/2021   BPH (benign prostatic hyperplasia) 12/13/2021   Chronic anticoagulation 12/13/2021   Dyslipidemia 08/17/2021   Leg swelling 02/06/2020   Pulmonary HTN ([place]) 02/06/2020   Essential hypertension 06/06/2019   Educated about COVID-19 virus infection 06/06/2019   Subtherapeutic international normalized ratio (INR) 05/15/2019   Hyponatremia 05/15/2019   Lumbar pain 05/15/2019   Back pain 05/14/2019   Atrial fibrillation, chronic ([place]) 06/17/2015   PCP:  Wenda Low, MD Pharmacy:   [facility], [place] - [place] [address] AT [place] [address] Phone:  [phone] Fax: [phone]     Social Determinants of Health (SDOH) Interventions    Readmission Risk Interventions     No data to display

## 2021-12-14 NOTE — Assessment & Plan Note (Signed)
CT of the left knee has demonstrated : 1. Acute nondisplaced fracture of the medial tibial plateau involving the articular surface with 1 mm of depression of the central articular surface. 2. Large lipohemarthrosis.

## 2021-12-14 NOTE — H&P (View-Only) (Signed)
Reason for Consult:left hip fracture Referring Physician: Benny Lennert, MD  Adam Richards is an 88 y.o. male.  HPI: Adam Richards is a 88 y.o. male with medical history significant of atrial fibrillation, chronic anticoagulation, hypertension, hyperlipidemia, and benign prostatic hypertrophy. He presented to [facility] ED by ambulance from home after a fall while at church. He fell on his left knee. He was unable to bear weight on the extremity following the fall.  At first he noted more knee pain but as some time passed he recognized more hip pain.   In the ED the patient was found to have a left hip fracture. Vital signs are within normal limits with the exception of hypertension likely complicated by pain.  Past Medical History:  Diagnosis Date   A-fib ([place])    Hyperlipidemia    Hypertension     Past Surgical History:  Procedure Laterality Date   KNEE SURGERY     "removed crystals"   TONSILLECTOMY AND ADENOIDECTOMY     UMBILICAL HERNIA REPAIR      Family History  Problem Relation Age of Onset   Heart failure Mother    Cancer Father    Prostate cancer Father    Cancer Brother    Stroke Maternal Grandmother    Tuberculosis Maternal Grandfather     Social History:  reports that he has been smoking cigarettes and cigars. He has never used smokeless tobacco. He reports that he does not drink alcohol and does not use drugs.  Allergies: No Known Allergies  Medications: I have reviewed the patient's current medications. Scheduled:  dutasteride  0.5 mg Oral Daily   metoprolol tartrate  25 mg Oral BID   senna  1 tablet Oral BID   tamsulosin  0.4 mg Oral Daily   Warfarin - Physician Dosing Inpatient   Does not apply q1600    Results for orders placed or performed during the hospital encounter of 12/13/21 (from the past 24 hour(s))  CBC     Status: Abnormal   Collection Time: 12/13/21  3:50 PM  Result Value Ref Range   WBC 10.2 4.0 - 10.5 K/uL   RBC 3.54 (L) 4.22 - 5.81 MIL/uL   Hemoglobin  12.0 (L) 13.0 - 17.0 g/dL   HCT 36.1 (L) 39.0 - 52.0 %   MCV 102.0 (H) 80.0 - 100.0 fL   MCH 33.9 26.0 - 34.0 pg   MCHC 33.2 30.0 - 36.0 g/dL   RDW 13.7 11.5 - 15.5 %   Platelets 156 150 - 400 K/uL   nRBC 0.0 0.0 - 0.2 %  Basic metabolic panel     Status: Abnormal   Collection Time: 12/13/21  3:50 PM  Result Value Ref Range   Sodium 135 135 - 145 mmol/L   Potassium 3.7 3.5 - 5.1 mmol/L   Chloride 100 98 - 111 mmol/L   CO2 27 22 - 32 mmol/L   Glucose, Bld 106 (H) 70 - 99 mg/dL   BUN 20 8 - 23 mg/dL   Creatinine, Ser 0.81 0.61 - 1.24 mg/dL   Calcium 9.0 8.9 - 10.3 mg/dL   GFR, Estimated >60 >60 mL/min   Anion gap 8 5 - 15  Protime-INR     Status: Abnormal   Collection Time: 12/13/21  3:50 PM  Result Value Ref Range   Prothrombin Time 28.1 (H) 11.4 - 15.2 seconds   INR 2.7 (H) 0.8 - 1.2  CBC     Status: Abnormal   Collection Time: 12/14/21  4:54  AM  Result Value Ref Range   WBC 7.8 4.0 - 10.5 K/uL   RBC 3.13 (L) 4.22 - 5.81 MIL/uL   Hemoglobin 10.8 (L) 13.0 - 17.0 g/dL   HCT 31.8 (L) 39.0 - 52.0 %   MCV 101.6 (H) 80.0 - 100.0 fL   MCH 34.5 (H) 26.0 - 34.0 pg   MCHC 34.0 30.0 - 36.0 g/dL   RDW 14.0 11.5 - 15.5 %   Platelets 128 (L) 150 - 400 K/uL   nRBC 0.0 0.0 - 0.2 %  Basic metabolic panel     Status: Abnormal   Collection Time: 12/14/21  4:54 AM  Result Value Ref Range   Sodium 135 135 - 145 mmol/L   Potassium 4.3 3.5 - 5.1 mmol/L   Chloride 103 98 - 111 mmol/L   CO2 26 22 - 32 mmol/L   Glucose, Bld 122 (H) 70 - 99 mg/dL   BUN 21 8 - 23 mg/dL   Creatinine, Ser 0.86 0.61 - 1.24 mg/dL   Calcium 8.7 (L) 8.9 - 10.3 mg/dL   GFR, Estimated >60 >60 mL/min   Anion gap 6 5 - 15  Protime-INR     Status: Abnormal   Collection Time: 12/14/21  4:54 AM  Result Value Ref Range   Prothrombin Time 31.5 (H) 11.4 - 15.2 seconds   INR 3.1 (H) 0.8 - 1.2    X-ray: CLINICAL DATA:  Injury from a fall. Anterior left knee pain. Fell onto left knee walking upstairs.   EXAM: LEFT  KNEE - COMPLETE 4+ VIEW   COMPARISON:  None Available.   FINDINGS: There is diffuse decreased bone mineralization. Mild medial compartment joint space narrowing. Mild mediolateral compartment chondrocalcinosis. Mild peripheral medial compartment degenerative osteophytosis. Small inferior greater than superior patellar degenerative osteophytes. No joint effusion. Apparent well corticated likely chronic ossicle at the anterior aspect of the proximal tibia on lateral view. No definite acute fracture is seen. No dislocation. Moderate vascular calcifications.   IMPRESSION: Within the limitations of diffuse decreased bone mineralization, no acute fracture is seen.     Electronically Signed   By: Yvonne Kendall M.D.  CLINICAL DATA:  Fall with left hip pain.   EXAM: DG HIP (WITH OR WITHOUT PELVIS) 2-3V LEFT   COMPARISON:  None Available.   FINDINGS: Bones are diffusely demineralized. SI joints and symphysis pubis unremarkable. No evidence for pubic ramus fracture. Although fracture line is not well demonstrated, there does appear to be a subcapsular transcervical left femoral neck fracture with mild varus angulation.   IMPRESSION: Imaging features compatible with sub capsule or transcervical femoral neck fracture although assessment limited by bony demineralization. CT imaging could be used to further characterize as clinically warranted.     Electronically Signed   By: Misty Stanley M.D.  ROS: As per HPI Afib on chronic coumadin  Blood pressure (!) 141/82, pulse 85, temperature (!) 97.3 F (36.3 C), temperature source Oral, resp. rate 16, SpO2 92 %.  Physical Exam:  Constitutional:  The patient is awake, alert, and oriented x 3. No acute distress. Eyes:  pupils and irises appear normal Normal lids and conjunctivae ENMT:  grossly normal hearing  Lips appear normal external ears, nose appear normal Oropharynx: mucosa, tongue,posterior pharynx appear normal Neck:   neck appears normal, no masses, normal ROM, supple no thyromegaly Respiratory:  No increased work of breathing. No wheezes, rales, or rhonchi No tactile fremitus Cardiovascular:  Regular rate and rhythm No murmurs, ectopy, or gallups. No lateral  PMI. No thrills. Abdomen:  Abdomen is soft, non-tender, non-distended No hernias, masses, or organomegaly Normoactive bowel sounds.  Musculoskeletal:  Left LE propped on pillow Mild abrasion left knee without erythema Painful range of motion Mild edema Skin:  No rashes, lesions, ulcers palpation of skin: no induration or nodules Neurologic:  CN 2-12 intact Sensation all 4 extremities intact Psychiatric:  Mental status Mood, affect appropriate Orientation to person, place, time  judgment and insight appear intact   Assessment/Plan: Fall on to left knee with left femoral neck fracture Rule out knee injury  Plan: On coumadin - current INR 3.1 Need to get this down a little Would give some Vitamin K and recheck INR tonight and tomorrow I would like to get him to the OR tomorrow if possible to address the hip Spoke with patient and daughter about my recommendations for a left hip hemiarthroplasty  Will order CT scan of left knee to rule our fracture in setting of osteoporosis  Orders to follow for surgery  Mauri Pole 12/14/2021, 7:36 AM

## 2021-12-14 NOTE — Consult Note (Signed)
Reason for Consult:left hip fracture Referring Physician: Benny Lennert, MD  Adam Richards is an 88 y.o. male.  HPI: Adam Richards is a 88 y.o. male with medical history significant of atrial fibrillation, chronic anticoagulation, hypertension, hyperlipidemia, and benign prostatic hypertrophy. He presented to [facility] ED by ambulance from home after a fall while at church. He fell on his left knee. He was unable to bear weight on the extremity following the fall.  At first he noted more knee pain but as some time passed he recognized more hip pain.   In the ED the patient was found to have a left hip fracture. Vital signs are within normal limits with the exception of hypertension likely complicated by pain.  Past Medical History:  Diagnosis Date   A-fib ([place])    Hyperlipidemia    Hypertension     Past Surgical History:  Procedure Laterality Date   KNEE SURGERY     "removed crystals"   TONSILLECTOMY AND ADENOIDECTOMY     UMBILICAL HERNIA REPAIR      Family History  Problem Relation Age of Onset   Heart failure Mother    Cancer Father    Prostate cancer Father    Cancer Brother    Stroke Maternal Grandmother    Tuberculosis Maternal Grandfather     Social History:  reports that he has been smoking cigarettes and cigars. He has never used smokeless tobacco. He reports that he does not drink alcohol and does not use drugs.  Allergies: No Known Allergies  Medications: I have reviewed the patient's current medications. Scheduled:  dutasteride  0.5 mg Oral Daily   metoprolol tartrate  25 mg Oral BID   senna  1 tablet Oral BID   tamsulosin  0.4 mg Oral Daily   Warfarin - Physician Dosing Inpatient   Does not apply q1600    Results for orders placed or performed during the hospital encounter of 12/13/21 (from the past 24 hour(s))  CBC     Status: Abnormal   Collection Time: 12/13/21  3:50 PM  Result Value Ref Range   WBC 10.2 4.0 - 10.5 K/uL   RBC 3.54 (L) 4.22 - 5.81 MIL/uL   Hemoglobin  12.0 (L) 13.0 - 17.0 g/dL   HCT 36.1 (L) 39.0 - 52.0 %   MCV 102.0 (H) 80.0 - 100.0 fL   MCH 33.9 26.0 - 34.0 pg   MCHC 33.2 30.0 - 36.0 g/dL   RDW 13.7 11.5 - 15.5 %   Platelets 156 150 - 400 K/uL   nRBC 0.0 0.0 - 0.2 %  Basic metabolic panel     Status: Abnormal   Collection Time: 12/13/21  3:50 PM  Result Value Ref Range   Sodium 135 135 - 145 mmol/L   Potassium 3.7 3.5 - 5.1 mmol/L   Chloride 100 98 - 111 mmol/L   CO2 27 22 - 32 mmol/L   Glucose, Bld 106 (H) 70 - 99 mg/dL   BUN 20 8 - 23 mg/dL   Creatinine, Ser 0.81 0.61 - 1.24 mg/dL   Calcium 9.0 8.9 - 10.3 mg/dL   GFR, Estimated >60 >60 mL/min   Anion gap 8 5 - 15  Protime-INR     Status: Abnormal   Collection Time: 12/13/21  3:50 PM  Result Value Ref Range   Prothrombin Time 28.1 (H) 11.4 - 15.2 seconds   INR 2.7 (H) 0.8 - 1.2  CBC     Status: Abnormal   Collection Time: 12/14/21  4:54  AM  Result Value Ref Range   WBC 7.8 4.0 - 10.5 K/uL   RBC 3.13 (L) 4.22 - 5.81 MIL/uL   Hemoglobin 10.8 (L) 13.0 - 17.0 g/dL   HCT 31.8 (L) 39.0 - 52.0 %   MCV 101.6 (H) 80.0 - 100.0 fL   MCH 34.5 (H) 26.0 - 34.0 pg   MCHC 34.0 30.0 - 36.0 g/dL   RDW 14.0 11.5 - 15.5 %   Platelets 128 (L) 150 - 400 K/uL   nRBC 0.0 0.0 - 0.2 %  Basic metabolic panel     Status: Abnormal   Collection Time: 12/14/21  4:54 AM  Result Value Ref Range   Sodium 135 135 - 145 mmol/L   Potassium 4.3 3.5 - 5.1 mmol/L   Chloride 103 98 - 111 mmol/L   CO2 26 22 - 32 mmol/L   Glucose, Bld 122 (H) 70 - 99 mg/dL   BUN 21 8 - 23 mg/dL   Creatinine, Ser 0.86 0.61 - 1.24 mg/dL   Calcium 8.7 (L) 8.9 - 10.3 mg/dL   GFR, Estimated >60 >60 mL/min   Anion gap 6 5 - 15  Protime-INR     Status: Abnormal   Collection Time: 12/14/21  4:54 AM  Result Value Ref Range   Prothrombin Time 31.5 (H) 11.4 - 15.2 seconds   INR 3.1 (H) 0.8 - 1.2    X-ray: CLINICAL DATA:  Injury from a fall. Anterior left knee pain. Fell onto left knee walking upstairs.   EXAM: LEFT  KNEE - COMPLETE 4+ VIEW   COMPARISON:  None Available.   FINDINGS: There is diffuse decreased bone mineralization. Mild medial compartment joint space narrowing. Mild mediolateral compartment chondrocalcinosis. Mild peripheral medial compartment degenerative osteophytosis. Small inferior greater than superior patellar degenerative osteophytes. No joint effusion. Apparent well corticated likely chronic ossicle at the anterior aspect of the proximal tibia on lateral view. No definite acute fracture is seen. No dislocation. Moderate vascular calcifications.   IMPRESSION: Within the limitations of diffuse decreased bone mineralization, no acute fracture is seen.     Electronically Signed   By: Yvonne Kendall M.D.  CLINICAL DATA:  Fall with left hip pain.   EXAM: DG HIP (WITH OR WITHOUT PELVIS) 2-3V LEFT   COMPARISON:  None Available.   FINDINGS: Bones are diffusely demineralized. SI joints and symphysis pubis unremarkable. No evidence for pubic ramus fracture. Although fracture line is not well demonstrated, there does appear to be a subcapsular transcervical left femoral neck fracture with mild varus angulation.   IMPRESSION: Imaging features compatible with sub capsule or transcervical femoral neck fracture although assessment limited by bony demineralization. CT imaging could be used to further characterize as clinically warranted.     Electronically Signed   By: Misty Stanley M.D.  ROS: As per HPI Afib on chronic coumadin  Blood pressure (!) 141/82, pulse 85, temperature (!) 97.3 F (36.3 C), temperature source Oral, resp. rate 16, SpO2 92 %.  Physical Exam:  Constitutional:  The patient is awake, alert, and oriented x 3. No acute distress. Eyes:  pupils and irises appear normal Normal lids and conjunctivae ENMT:  grossly normal hearing  Lips appear normal external ears, nose appear normal Oropharynx: mucosa, tongue,posterior pharynx appear normal Neck:   neck appears normal, no masses, normal ROM, supple no thyromegaly Respiratory:  No increased work of breathing. No wheezes, rales, or rhonchi No tactile fremitus Cardiovascular:  Regular rate and rhythm No murmurs, ectopy, or gallups. No lateral  PMI. No thrills. Abdomen:  Abdomen is soft, non-tender, non-distended No hernias, masses, or organomegaly Normoactive bowel sounds.  Musculoskeletal:  Left LE propped on pillow Mild abrasion left knee without erythema Painful range of motion Mild edema Skin:  No rashes, lesions, ulcers palpation of skin: no induration or nodules Neurologic:  CN 2-12 intact Sensation all 4 extremities intact Psychiatric:  Mental status Mood, affect appropriate Orientation to person, place, time  judgment and insight appear intact   Assessment/Plan: Fall on to left knee with left femoral neck fracture Rule out knee injury  Plan: On coumadin - current INR 3.1 Need to get this down a little Would give some Vitamin K and recheck INR tonight and tomorrow I would like to get him to the OR tomorrow if possible to address the hip Spoke with patient and daughter about my recommendations for a left hip hemiarthroplasty  Will order CT scan of left knee to rule our fracture in setting of osteoporosis  Orders to follow for surgery  Mauri Pole 12/14/2021, 7:36 AM

## 2021-12-14 NOTE — Progress Notes (Signed)
Initial Nutrition Assessment  INTERVENTION:   -Ensure Plus High Protein po BID, each supplement provides 350 kcal and 20 grams of protein.   -Multivitamin with minerals daily  -Liberalized diet from heart healthy to regular given advanced age   NUTRITION DIAGNOSIS:   Increased nutrient needs related to hip fracture as evidenced by estimated needs.  GOAL:   Patient will meet greater than or equal to 90% of their needs  MONITOR:   PO intake, Supplement acceptance, Labs, Weight trends, I & O's  REASON FOR ASSESSMENT:   Consult Hip fracture protocol  ASSESSMENT:   88 y.o. male with medical history significant of atrial fibrillation, chronic anticoagulation, hypertension, hyperlipidemia, and benign prostatic hypertrophy. He presented to [facility] ED by ambulance from home after a fall while at church. Admitted for left hip fracture.  Patient not in room at time of visit. No family either at bedside. Per chart review, pt with left hip fracture, awaiting plan for surgery. Possibly tomorrow 7/25.  PO intakes: 100% of meals so far. Will liberalize diet to regular to maximize menu options.  Will order Ensure supplements for additional protein. Have added daily MVI as well.   Per weight records, pt weighed 194 lbs on 3/28. Current weight: 186 lbs. Insignificant weight loss given time frame.  Medications: Senokot  Labs reviewed.  NUTRITION - FOCUSED PHYSICAL EXAM:  Unable to complete a this time.  Diet Order:   Diet Order             Diet Heart Room service appropriate? Yes; Fluid consistency: Thin  Diet effective now                   EDUCATION NEEDS:   Not appropriate for education at this time  Skin:  Skin Assessment: Reviewed RN Assessment  Last BM:  7/24  Height:   Ht Readings from Last 1 Encounters:  12/14/21 '5\' 10"'$  (1.778 m)    Weight:   Wt Readings from Last 1 Encounters:  12/14/21 84.5 kg    BMI:  Body mass index is 26.73 kg/m.  Estimated  Nutritional Needs:   Kcal:  1850-2050  Protein:  75-90g  Fluid:  2L/day   Clayton Bibles, MS, RD, LDN Inpatient Clinical Dietitian Contact information available via Amion

## 2021-12-15 ENCOUNTER — Encounter (HOSPITAL_COMMUNITY): Payer: Self-pay | Admitting: Internal Medicine

## 2021-12-15 ENCOUNTER — Encounter (HOSPITAL_COMMUNITY)
Admission: EM | Disposition: A | Discharge status: Skilled Nursing Facility | Payer: Self-pay | Source: Home / Self Care | Attending: Internal Medicine

## 2021-12-15 ENCOUNTER — Other Ambulatory Visit: Payer: Self-pay

## 2021-12-15 ENCOUNTER — Inpatient Hospital Stay (HOSPITAL_COMMUNITY): Payer: Medicare Other | Admitting: Anesthesiology

## 2021-12-15 ENCOUNTER — Inpatient Hospital Stay (HOSPITAL_COMMUNITY): Payer: Medicare Other

## 2021-12-15 DIAGNOSIS — I4891 Unspecified atrial fibrillation: Secondary | ICD-10-CM

## 2021-12-15 DIAGNOSIS — S72002A Fracture of unspecified part of neck of left femur, initial encounter for closed fracture: Secondary | ICD-10-CM

## 2021-12-15 DIAGNOSIS — I10 Essential (primary) hypertension: Secondary | ICD-10-CM

## 2021-12-15 DIAGNOSIS — F17210 Nicotine dependence, cigarettes, uncomplicated: Secondary | ICD-10-CM

## 2021-12-15 DIAGNOSIS — Z96649 Presence of unspecified artificial hip joint: Secondary | ICD-10-CM

## 2021-12-15 DIAGNOSIS — Z8781 Personal history of (healed) traumatic fracture: Secondary | ICD-10-CM | POA: Diagnosis not present

## 2021-12-15 HISTORY — PX: HIP ARTHROPLASTY: SHX981

## 2021-12-15 LAB — PROTIME-INR
INR: 1.5 — ABNORMAL HIGH (ref 0.8–1.2)
Prothrombin Time: 18.1 seconds — ABNORMAL HIGH (ref 11.4–15.2)

## 2021-12-15 LAB — TYPE AND SCREEN
ABO/RH(D): O POS
Antibody Screen: NEGATIVE

## 2021-12-15 LAB — MRSA NEXT GEN BY PCR, NASAL: MRSA by PCR Next Gen: NOT DETECTED

## 2021-12-15 LAB — ABO/RH: ABO/RH(D): O POS

## 2021-12-15 SURGERY — HEMIARTHROPLASTY, HIP, DIRECT ANTERIOR APPROACH, FOR FRACTURE
Anesthesia: General | Site: Knee | Laterality: Left

## 2021-12-15 MED ORDER — LACTATED RINGERS IV SOLN: INTRAVENOUS | Status: DC | PRN

## 2021-12-15 MED ORDER — ORAL CARE MOUTH RINSE
15.0000 mL | Freq: Once | OROMUCOSAL | Status: AC
Start: 2021-12-15 — End: 2021-12-15

## 2021-12-15 MED ORDER — ONDANSETRON HCL 4 MG PO TABS: 4.0000 mg | ORAL_TABLET | Freq: Four times a day (QID) | ORAL | Status: DC | PRN

## 2021-12-15 MED ORDER — FENTANYL CITRATE (PF) 100 MCG/2ML IJ SOLN
INTRAMUSCULAR | Status: AC
  Filled 2021-12-15: qty 2

## 2021-12-15 MED ORDER — PHENOL 1.4 % MT LIQD: 1.0000 | OROMUCOSAL | Status: DC | PRN

## 2021-12-15 MED ORDER — FENTANYL CITRATE PF 50 MCG/ML IJ SOSY: 25.0000 ug | PREFILLED_SYRINGE | INTRAMUSCULAR | Status: DC | PRN

## 2021-12-15 MED ORDER — SIMETHICONE 80 MG PO CHEW
80.0000 mg | CHEWABLE_TABLET | Freq: Four times a day (QID) | ORAL | Status: DC | PRN
Start: 2021-12-15 — End: 2021-12-18
  Administered 2021-12-15 – 2021-12-18 (×4): 80 mg via ORAL
  Filled 2021-12-15 (×4): qty 1

## 2021-12-15 MED ORDER — ROCURONIUM BROMIDE 10 MG/ML (PF) SYRINGE
PREFILLED_SYRINGE | INTRAVENOUS | Status: DC | PRN
  Administered 2021-12-15: 50 mg via INTRAVENOUS

## 2021-12-15 MED ORDER — MORPHINE SULFATE (PF) 2 MG/ML IV SOLN: 0.5000 mg | INTRAVENOUS | Status: DC | PRN

## 2021-12-15 MED ORDER — FENTANYL CITRATE (PF) 100 MCG/2ML IJ SOLN
INTRAMUSCULAR | Status: DC | PRN
  Administered 2021-12-15: 75 ug via INTRAVENOUS
  Administered 2021-12-15 (×3): 25 ug via INTRAVENOUS

## 2021-12-15 MED ORDER — HYDROCODONE-ACETAMINOPHEN 5-325 MG PO TABS
1.0000 | ORAL_TABLET | ORAL | Status: DC | PRN
  Administered 2021-12-15 – 2021-12-17 (×5): 1 via ORAL
  Filled 2021-12-15 (×5): qty 1

## 2021-12-15 MED ORDER — OXYCODONE HCL 5 MG PO TABS: 5.0000 mg | ORAL_TABLET | Freq: Once | ORAL | Status: AC | PRN

## 2021-12-15 MED ORDER — TRANEXAMIC ACID-NACL 1000-0.7 MG/100ML-% IV SOLN
1000.0000 mg | INTRAVENOUS | Status: AC
  Administered 2021-12-15: 1000 mg via INTRAVENOUS
  Filled 2021-12-15: qty 100

## 2021-12-15 MED ORDER — ONDANSETRON HCL 4 MG/2ML IJ SOLN
INTRAMUSCULAR | Status: AC
  Filled 2021-12-15: qty 2

## 2021-12-15 MED ORDER — SODIUM CHLORIDE 0.9 % IV SOLN: INTRAVENOUS | Status: DC

## 2021-12-15 MED ORDER — METOCLOPRAMIDE HCL 5 MG/ML IJ SOLN: 5.0000 mg | Freq: Three times a day (TID) | INTRAMUSCULAR | Status: DC | PRN

## 2021-12-15 MED ORDER — MENTHOL 3 MG MT LOZG: 1.0000 | LOZENGE | OROMUCOSAL | Status: DC | PRN

## 2021-12-15 MED ORDER — ONDANSETRON HCL 4 MG/2ML IJ SOLN
INTRAMUSCULAR | Status: DC | PRN
  Administered 2021-12-15: 4 mg via INTRAVENOUS

## 2021-12-15 MED ORDER — OXYCODONE HCL 5 MG/5ML PO SOLN: 5.0000 mg | Freq: Once | ORAL | Status: AC | PRN

## 2021-12-15 MED ORDER — ONDANSETRON HCL 4 MG/2ML IJ SOLN: 4.0000 mg | Freq: Four times a day (QID) | INTRAMUSCULAR | Status: DC | PRN

## 2021-12-15 MED ORDER — METOCLOPRAMIDE HCL 5 MG PO TABS: 5.0000 mg | ORAL_TABLET | Freq: Three times a day (TID) | ORAL | Status: DC | PRN

## 2021-12-15 MED ORDER — BISACODYL 10 MG RE SUPP: 10.0000 mg | Freq: Every day | RECTAL | Status: DC | PRN

## 2021-12-15 MED ORDER — 0.9 % SODIUM CHLORIDE (POUR BTL) OPTIME
TOPICAL | Status: DC | PRN
  Administered 2021-12-15: 1000 mL

## 2021-12-15 MED ORDER — POLYETHYLENE GLYCOL 3350 17 G PO PACK: 17.0000 g | PACK | Freq: Every day | ORAL | Status: DC | PRN

## 2021-12-15 MED ORDER — STERILE WATER FOR IRRIGATION IR SOLN
Status: DC | PRN
  Administered 2021-12-15: 2000 mL

## 2021-12-15 MED ORDER — LIDOCAINE 2% (20 MG/ML) 5 ML SYRINGE
INTRAMUSCULAR | Status: DC | PRN
  Administered 2021-12-15: 60 mg via INTRAVENOUS

## 2021-12-15 MED ORDER — TRANEXAMIC ACID-NACL 1000-0.7 MG/100ML-% IV SOLN
1000.0000 mg | Freq: Once | INTRAVENOUS | Status: AC
  Administered 2021-12-15: 1000 mg via INTRAVENOUS
  Filled 2021-12-15: qty 100

## 2021-12-15 MED ORDER — PROPOFOL 10 MG/ML IV BOLUS
INTRAVENOUS | Status: DC | PRN
  Administered 2021-12-15: 100 mg via INTRAVENOUS

## 2021-12-15 MED ORDER — CEFAZOLIN SODIUM-DEXTROSE 2-4 GM/100ML-% IV SOLN
2.0000 g | INTRAVENOUS | Status: AC
  Administered 2021-12-15: 2 g via INTRAVENOUS
  Filled 2021-12-15: qty 100

## 2021-12-15 MED ORDER — DEXAMETHASONE SODIUM PHOSPHATE 10 MG/ML IJ SOLN
10.0000 mg | Freq: Once | INTRAMUSCULAR | Status: AC
  Administered 2021-12-16: 10 mg via INTRAVENOUS
  Filled 2021-12-15: qty 1

## 2021-12-15 MED ORDER — EPHEDRINE SULFATE-NACL 50-0.9 MG/10ML-% IV SOSY
PREFILLED_SYRINGE | INTRAVENOUS | Status: DC | PRN
  Administered 2021-12-15: 10 mg via INTRAVENOUS

## 2021-12-15 MED ORDER — POVIDONE-IODINE 10 % EX SWAB: 2.0000 | Freq: Once | CUTANEOUS | Status: DC

## 2021-12-15 MED ORDER — METHOCARBAMOL 500 MG PO TABS
500.0000 mg | ORAL_TABLET | Freq: Four times a day (QID) | ORAL | Status: DC | PRN
  Administered 2021-12-16 – 2021-12-17 (×2): 500 mg via ORAL
  Filled 2021-12-15 (×2): qty 1

## 2021-12-15 MED ORDER — METHOCARBAMOL 500 MG IVPB - SIMPLE MED
INTRAVENOUS | Status: AC
  Administered 2021-12-15: 500 mg via INTRAVENOUS
  Filled 2021-12-15: qty 55

## 2021-12-15 MED ORDER — OXYCODONE HCL 5 MG PO TABS
ORAL_TABLET | ORAL | Status: AC
  Administered 2021-12-15: 5 mg via ORAL
  Filled 2021-12-15: qty 1

## 2021-12-15 MED ORDER — DIPHENHYDRAMINE HCL 12.5 MG/5ML PO ELIX: 12.5000 mg | ORAL_SOLUTION | ORAL | Status: DC | PRN

## 2021-12-15 MED ORDER — EPHEDRINE 5 MG/ML INJ
INTRAVENOUS | Status: AC
  Filled 2021-12-15: qty 5

## 2021-12-15 MED ORDER — DOCUSATE SODIUM 100 MG PO CAPS
100.0000 mg | ORAL_CAPSULE | Freq: Two times a day (BID) | ORAL | Status: DC
  Administered 2021-12-15 – 2021-12-18 (×6): 100 mg via ORAL
  Filled 2021-12-15 (×6): qty 1

## 2021-12-15 MED ORDER — CEFAZOLIN SODIUM-DEXTROSE 2-4 GM/100ML-% IV SOLN
2.0000 g | Freq: Four times a day (QID) | INTRAVENOUS | Status: AC
  Administered 2021-12-16 (×2): 2 g via INTRAVENOUS
  Filled 2021-12-15 (×2): qty 100

## 2021-12-15 MED ORDER — LACTATED RINGERS IV SOLN: INTRAVENOUS | Status: DC

## 2021-12-15 MED ORDER — PHENYLEPHRINE HCL-NACL 20-0.9 MG/250ML-% IV SOLN
INTRAVENOUS | Status: DC | PRN
  Administered 2021-12-15: 25 ug/min via INTRAVENOUS

## 2021-12-15 MED ORDER — FERROUS SULFATE 325 (65 FE) MG PO TABS
325.0000 mg | ORAL_TABLET | Freq: Three times a day (TID) | ORAL | Status: DC
  Administered 2021-12-16 – 2021-12-18 (×7): 325 mg via ORAL
  Filled 2021-12-15 (×7): qty 1

## 2021-12-15 MED ORDER — DEXAMETHASONE SODIUM PHOSPHATE 10 MG/ML IJ SOLN
INTRAMUSCULAR | Status: DC | PRN
  Administered 2021-12-15: 10 mg via INTRAVENOUS

## 2021-12-15 MED ORDER — METHOCARBAMOL 500 MG IVPB - SIMPLE MED: 500.0000 mg | Freq: Four times a day (QID) | INTRAVENOUS | Status: DC | PRN

## 2021-12-15 MED ORDER — CHLORHEXIDINE GLUCONATE 4 % EX LIQD
60.0000 mL | Freq: Once | CUTANEOUS | Status: AC
Start: 2021-12-15 — End: 2021-12-15
  Administered 2021-12-15: 4 via TOPICAL
  Filled 2021-12-15 (×2): qty 60

## 2021-12-15 MED ORDER — CHLORHEXIDINE GLUCONATE 0.12 % MT SOLN
15.0000 mL | Freq: Once | OROMUCOSAL | Status: AC
Start: 2021-12-15 — End: 2021-12-15
  Administered 2021-12-15: 15 mL via OROMUCOSAL

## 2021-12-15 MED ORDER — PHENYLEPHRINE 80 MCG/ML (10ML) SYRINGE FOR IV PUSH (FOR BLOOD PRESSURE SUPPORT)
PREFILLED_SYRINGE | INTRAVENOUS | Status: AC
  Filled 2021-12-15: qty 10

## 2021-12-15 MED ORDER — ACETAMINOPHEN 325 MG PO TABS: 325.0000 mg | ORAL_TABLET | Freq: Four times a day (QID) | ORAL | Status: DC | PRN

## 2021-12-15 MED ORDER — SUGAMMADEX SODIUM 200 MG/2ML IV SOLN
INTRAVENOUS | Status: DC | PRN
  Administered 2021-12-15: 200 mg via INTRAVENOUS

## 2021-12-15 MED ORDER — DEXAMETHASONE SODIUM PHOSPHATE 10 MG/ML IJ SOLN
INTRAMUSCULAR | Status: AC
  Filled 2021-12-15: qty 1

## 2021-12-15 MED ORDER — FENTANYL CITRATE PF 50 MCG/ML IJ SOSY
PREFILLED_SYRINGE | INTRAMUSCULAR | Status: AC
  Administered 2021-12-15: 25 ug via INTRAVENOUS
  Filled 2021-12-15: qty 1

## 2021-12-15 SURGICAL SUPPLY — 44 items
BAG COUNTER SPONGE SURGICOUNT (BAG) IMPLANT
BAG ZIPLOCK 12X15 (MISCELLANEOUS) ×3 IMPLANT
BALL HIP DEPUY 53 (Hips) IMPLANT
BLADE SAW SGTL 18X1.27X75 (BLADE) ×3 IMPLANT
BNDG ELASTIC 6X5.8 VLCR STR LF (GAUZE/BANDAGES/DRESSINGS) ×1 IMPLANT
COVER SURGICAL LIGHT HANDLE (MISCELLANEOUS) ×3 IMPLANT
DERMABOND ADVANCED (GAUZE/BANDAGES/DRESSINGS) ×1
DERMABOND ADVANCED .7 DNX12 (GAUZE/BANDAGES/DRESSINGS) ×2 IMPLANT
DRAPE ORTHO SPLIT 77X108 STRL (DRAPES) ×2
DRAPE POUCH INSTRU U-SHP 10X18 (DRAPES) ×3 IMPLANT
DRAPE SURG 17X11 SM STRL (DRAPES) ×3 IMPLANT
DRAPE SURG ORHT 6 SPLT 77X108 (DRAPES) ×4 IMPLANT
DRAPE U-SHAPE 47X51 STRL (DRAPES) ×3 IMPLANT
DRSG AQUACEL AG ADV 3.5X10 (GAUZE/BANDAGES/DRESSINGS) ×3 IMPLANT
DRSG TEGADERM 4X4.75 (GAUZE/BANDAGES/DRESSINGS) ×3 IMPLANT
DURAPREP 26ML APPLICATOR (WOUND CARE) ×3 IMPLANT
ELECT BLADE TIP CTD 4 INCH (ELECTRODE) ×3 IMPLANT
ELECT REM PT RETURN 15FT ADLT (MISCELLANEOUS) ×3 IMPLANT
GLOVE BIO SURGEON STRL SZ 6 (GLOVE) ×3 IMPLANT
GLOVE BIO SURGEON STRL SZ7 (GLOVE) ×3 IMPLANT
GLOVE BIOGEL PI IND STRL 7.5 (GLOVE) ×4 IMPLANT
GLOVE BIOGEL PI IND STRL 8.5 (GLOVE) ×2 IMPLANT
GLOVE BIOGEL PI INDICATOR 7.5 (GLOVE) ×2
GLOVE BIOGEL PI INDICATOR 8.5 (GLOVE) ×1
GLOVE ECLIPSE 8.0 STRL XLNG CF (GLOVE) ×6 IMPLANT
GLOVE INDICATOR 6.5 STRL GRN (GLOVE) ×3 IMPLANT
GOWN STRL REUS W/ TWL LRG LVL3 (GOWN DISPOSABLE) ×4 IMPLANT
GOWN STRL REUS W/TWL LRG LVL3 (GOWN DISPOSABLE) ×2
HIP BALL DEPUY 53 (Hips) ×3 IMPLANT
KIT BASIN OR (CUSTOM PROCEDURE TRAY) ×3 IMPLANT
KIT TURNOVER KIT A (KITS) IMPLANT
MANIFOLD NEPTUNE II (INSTRUMENTS) ×3 IMPLANT
PACK TOTAL JOINT (CUSTOM PROCEDURE TRAY) ×3 IMPLANT
PROTECTOR NERVE ULNAR (MISCELLANEOUS) ×3 IMPLANT
SPACER FEM TAPERED +5 12/14 (Hips) ×1 IMPLANT
STEM FEMORAL SZ9 HIGH ACTIS (Stem) ×1 IMPLANT
SUT MNCRL AB 4-0 PS2 18 (SUTURE) ×3 IMPLANT
SUT STRATAFIX 0 PDS 27 VIOLET (SUTURE) ×3
SUT VIC AB 1 CT1 36 (SUTURE) ×3 IMPLANT
SUT VIC AB 2-0 CT1 27 (SUTURE) ×2
SUT VIC AB 2-0 CT1 TAPERPNT 27 (SUTURE) ×4 IMPLANT
SUTURE STRATFX 0 PDS 27 VIOLET (SUTURE) ×2 IMPLANT
TOWEL OR 17X26 10 PK STRL BLUE (TOWEL DISPOSABLE) ×6 IMPLANT
TRAY FOLEY MTR SLVR 16FR STAT (SET/KITS/TRAYS/PACK) ×3 IMPLANT

## 2021-12-15 NOTE — Progress Notes (Addendum)
PROGRESS NOTE  Adam Richards HWE:[id number] DOB: 1926-10-07 DOA: 12/13/2021 PCP: Wenda Low, MD  HPI/Recap of past 24 hours: Adam Richards is a 88 y.o. male with medical history significant of atrial fibrillation on coumadin, hypertension, hyperlipidemia, and benign prostatic hypertrophy who presented to [facility] ED via EMS from home after a fall while walking up stairs. He fell on his left knee. He was unable to bear weight on the extremity following the fall.  Found to have a left hip fracture.  CT of the left knee this morning demonstrates a fracture of the tibial plateau, lipohemarthrosis, and a 1 mm depression at the site of the fracture.  Plan for orthopedic surgery repair today.   12/15/21:  Seen and examined with his daughter at his bedside.  Pain level is up trending  prior to dose of analgesics.  Assessment/Plan: Principal Problem:   S/p left hip fracture Active Problems:   Atrial fibrillation, chronic (HCC)   Essential hypertension   Dyslipidemia   BPH (benign prostatic hyperplasia)   Chronic anticoagulation   Tibial plateau fracture, left  * S/p left hip fracture post mechanical fall Home coumadin on hold, post IV vitamin K with repeat INR 1.5 from 3.1 Plan for surgery on 12/15/21 Analgesics and bowel regimen PRN PT/OT possibly tomorrow with ortho's guidance TOC consulted to assist with SNF placement. Fall precautions   Tibial plateau fracture, left Left knee joint effusion. CT of the left knee has demonstrated : 1. Acute nondisplaced fracture of the medial tibial plateau involving the articular surface with 1 mm of depression of the central articular surface. 2. Large lipohemarthrosis.   Supratherapeutic INR on Coumadin The patient takes coumadin 2.5 mg daily for stroke prophylaxis. His INR today was 3.1. Coumadin held. Received 2 mg IV vitamin K 12/14/21.    BPH (benign prostatic hyperplasia) The patient will be continued on Avodart and Flomax as he takes them at home.   Monitor urine output.   Dyslipidemia Continue statin as at home.   Essential hypertension Blood pressures have normalized today. Will continue the patient on metoprolol and adjust as necessary.   Atrial fibrillation, chronic (HCC) Rate controlled on metoprolol 37.5 bid. Monitor on telemetry. Coumadin is now held and Vitamin K has been given to reduce INR.    DVT prophylaxis: SCDs Code Status: Full code Family Communication: His daughter at bedside. Disposition Plan: SNF         Status is: Inpatient The patient requires at least 2 midnights for further evaluation and treatment of present condition.    Objective: Vitals:   12/14/21 1927 12/15/21 0414 12/15/21 1154 12/15/21 1154  BP: 131/82 (!) 142/85 115/78 115/78  Pulse: 93 74 84 85  Resp: '18 14 14 14  '$ Temp: 98.2 F (36.8 C) 98.9 F (37.2 C) 98.5 F (36.9 C) 98.5 F (36.9 C)  TempSrc: Oral Oral Oral Oral  SpO2: 94% 92% 93% 93%  Weight:      Height:        Intake/Output Summary (Last 24 hours) at 12/15/2021 1609 Last data filed at 12/15/2021 1445 Gross per 24 hour  Intake 360 ml  Output 775 ml  Net -415 ml   Filed Weights   12/14/21 1000  Weight: 84.5 kg    Exam:  General: 89 y.o. year-old male well developed well nourished in no acute distress.  Alert and oriented x3. Cardiovascular: Regular rate and rhythm with no rubs or gallops.  No thyromegaly or JVD noted.   Respiratory: Clear to auscultation  with no wheezes or rales. Good inspiratory effort. Abdomen: Soft nontender nondistended with normal bowel sounds x4 quadrants. Musculoskeletal: No lower extremity edema. 2/4 pulses in all 4 extremities. Skin: Left knee effusion. Psychiatry: Mood is appropriate for condition and setting   Data Reviewed: CBC: Recent Labs  Lab 12/13/21 1550 12/14/21 0454  WBC 10.2 7.8  HGB 12.0* 10.8*  HCT 36.1* 31.8*  MCV 102.0* 101.6*  PLT 156 300*   Basic Metabolic Panel: Recent Labs  Lab 12/13/21 1550  12/14/21 0454  NA 135 135  K 3.7 4.3  CL 100 103  CO2 27 26  GLUCOSE 106* 122*  BUN 20 21  CREATININE 0.81 0.86  CALCIUM 9.0 8.7*   GFR: Estimated Creatinine Clearance: 56.6 mL/min (by C-G formula based on SCr of 0.86 mg/dL). Liver Function Tests: No results for input(s): "AST", "ALT", "ALKPHOS", "BILITOT", "PROT", "ALBUMIN" in the last 168 hours. No results for input(s): "LIPASE", "AMYLASE" in the last 168 hours. No results for input(s): "AMMONIA" in the last 168 hours. Coagulation Profile: Recent Labs  Lab 12/13/21 1550 12/14/21 0454 12/14/21 1804 12/15/21 0436  INR 2.7* 3.1* 1.9* 1.5*   Cardiac Enzymes: No results for input(s): "CKTOTAL", "CKMB", "CKMBINDEX", "TROPONINI" in the last 168 hours. BNP (last 3 results) No results for input(s): "PROBNP" in the last 8760 hours. HbA1C: No results for input(s): "HGBA1C" in the last 72 hours. CBG: No results for input(s): "GLUCAP" in the last 168 hours. Lipid Profile: No results for input(s): "CHOL", "HDL", "LDLCALC", "TRIG", "CHOLHDL", "LDLDIRECT" in the last 72 hours. Thyroid Function Tests: No results for input(s): "TSH", "T4TOTAL", "FREET4", "T3FREE", "THYROIDAB" in the last 72 hours. Anemia Panel: No results for input(s): "VITAMINB12", "FOLATE", "FERRITIN", "TIBC", "IRON", "RETICCTPCT" in the last 72 hours. Urine analysis:    Component Value Date/Time   COLORURINE YELLOW 10/18/2020 1003   APPEARANCEUR CLEAR 10/18/2020 1003   LABSPEC 1.013 10/18/2020 1003   PHURINE 7.0 10/18/2020 1003   GLUCOSEU NEGATIVE 10/18/2020 1003   HGBUR NEGATIVE 10/18/2020 1003   [place] 10/18/2020 1003   [place] 10/18/2020 1003   PROTEINUR NEGATIVE 10/18/2020 1003   NITRITE NEGATIVE 10/18/2020 1003   [place] 10/18/2020 1003   Sepsis Labs: '@LABRCNTIP'$ (procalcitonin:4,lacticidven:4)  ) Recent Results (from the past 240 hour(s))  MRSA Next Gen by PCR, Nasal     Status: None   Collection Time:  12/15/21  9:03 AM   Specimen: Nasal Mucosa; Nasal Swab  Result Value Ref Range Status   MRSA by PCR Next Gen NOT DETECTED NOT DETECTED Final    Comment: (NOTE) The GeneXpert MRSA Assay (FDA approved for NASAL specimens only), is one component of a comprehensive MRSA colonization surveillance program. It is not intended to diagnose MRSA infection nor to guide or monitor treatment for MRSA infections. Test performance is not FDA approved in patients less than 74 years old. Performed at [facility], [place] [address], [place], [place] [address]       Studies: No results found.  Scheduled Meds:  dutasteride  0.5 mg Oral Daily   feeding supplement  237 mL Oral BID BM   metoprolol tartrate  25 mg Oral BID   multivitamin with minerals  1 tablet Oral Daily   povidone-iodine  2 Application Topical Once   senna  1 tablet Oral BID   tamsulosin  0.4 mg Oral Daily   Warfarin - Physician Dosing Inpatient   Does not apply q1600    Continuous Infusions:  sodium chloride 75 mL/hr at  12/15/21 0901    ceFAZolin (ANCEF) IV     methocarbamol (ROBAXIN) IV     tranexamic acid       LOS: 2 days     Kayleen Memos, MD [facility] Pager [phone]  If 7PM-7AM, please contact night-coverage [email or web address] Password [facility] 12/15/2021, 4:09 PM

## 2021-12-15 NOTE — Anesthesia Procedure Notes (Signed)
Date/Time: 12/15/2021 8:45 PM  Performed by: Cynda Familia, CRNAOxygen Delivery Method: Simple face mask Placement Confirmation: positive ETCO2 and breath sounds checked- equal and bilateral Dental Injury: Teeth and Oropharynx as per pre-operative assessment

## 2021-12-15 NOTE — Progress Notes (Signed)
[place] for warfarin Indication: hx atrial fibrillation  No Known Allergies  Patient Measurements: Height: '5\' 10"'$  (177.8 cm) Weight: 84.5 kg (186 lb 4.6 oz) IBW/kg (Calculated) : 73 Heparin Dosing Weight:   Vital Signs: Temp: 97.4 F (36.3 C) (07/25 1652) Temp Source: Oral (07/25 1652) BP: 155/91 (07/25 1652) Pulse Rate: 101 (07/25 1652)  Labs: Recent Labs    12/13/21 1550 12/14/21 0454 12/14/21 1804 12/15/21 0436  HGB 12.0* 10.8*  --   --   HCT 36.1* 31.8*  --   --   PLT 156 128*  --   --   LABPROT 28.1* 31.5* 22.0* 18.1*  INR 2.7* 3.1* 1.9* 1.5*  CREATININE 0.81 0.86  --   --     Estimated Creatinine Clearance: 56.6 mL/min (by C-G formula based on SCr of 0.86 mg/dL).   Medications:  - PTA warfarin regimen: 2.5 mg daily   Assessment: Patient is a 88 y.o M with hx afib on warfarin PTA who presented to the ED on 12/13/21 for evaluation s/p fall.  He was found to have left femoral neck fracture.  Warfarin resumed on admission but then held for repair of fractured hip and left knee joint aspiration on 12/15/21.  Vitamin K '2mg'$  IV x1 given on 12/14/21 for reversal of INR.  Pharmacy has been consulted on 12/15/21 to resume warfarin post-op. Spoke to [employer] (PA-C) from Ortho team, she said to start warfarin on 7/26 in the AM.   Today, 12/15/2021: - INR 1.5 at 4:36a this morning - s/p left hip hemiarthroplasty and Left knee joint aspiration this evening from 7:53p to 8:34p  Goal of Therapy:  INR 2-3 Monitor platelets by anticoagulation protocol: Yes   Plan:  - will f/u with INR and clinical status on 12/16/21 and order warfarin dose  Wasyl Dornfeld P 12/15/2021,7:34 PM

## 2021-12-15 NOTE — Anesthesia Procedure Notes (Signed)
Procedure Name: Intubation Date/Time: 12/15/2021 7:26 PM  Performed by: Cynda Familia, CRNAPre-anesthesia Checklist: Patient identified, Emergency Drugs available, Suction available and Patient being monitored Patient Re-evaluated:Patient Re-evaluated prior to induction Oxygen Delivery Method: Circle System Utilized Preoxygenation: Pre-oxygenation with 100% oxygen Induction Type: IV induction and Cricoid Pressure applied Ventilation: Mask ventilation without difficulty Laryngoscope Size: Miller and 2 Grade View: Grade II Tube type: Oral Tube size: 7.5 mm Number of attempts: 1 Airway Equipment and Method: Stylet Placement Confirmation: ETT inserted through vocal cords under direct vision, positive ETCO2 and breath sounds checked- equal and bilateral Secured at: 23 cm Tube secured with: Tape Dental Injury: Teeth and Oropharynx as per pre-operative assessment  Difficulty Due To: Difficulty was anticipated, Difficult Airway- due to limited oral opening and Difficult Airway- due to reduced neck mobility Future Recommendations: Recommend- induction with short-acting agent, and alternative techniques readily available Comments: Smooth induction [place]--- intubation AM CRNA atraumatic-- teeth and mouth as preop-- bilat BS

## 2021-12-15 NOTE — Op Note (Signed)
NAME:  Adam Richards                ACCOUNT NO.:  [id number]   MEDICAL RECORD NO.: [id number]   LOCATION:  7846                         FACILITY:  [facility]   DATE OF BIRTH:  1927-02-05  PHYSICIAN:  Pietro Cassis. Alvan Dame, M.D.     DATE OF PROCEDURE:  12/15/2021                               OPERATIVE REPORT     PREOPERATIVE DIAGNOSIS:  1. Left displaced femoral neck fracture, 2. Left proximal tibia fracture with lipohemarthrosis   POSTOPERATIVE DIAGNOSIS:  1. Left displaced femoral neck fracture, 2. Left proximal tibia fracture with lipohemarthrosis   PROCEDURE:  1. left hip hemiarthroplasty utilizing DePuy component, size 9 Hi Actis stem with a 89m unipolar ball with a +5 adapter, 2. Left knee joint aspiration   SURGEON:  MPietro Cassis OAlvan Dame MD   ASSISTANT:  ACostella Hatcher PA-C.   ANESTHESIA:  General.   SPECIMENS:  None.   DRAINS:  None.   BLOOD LOSS:  About 150 cc.   COMPLICATIONS:  None.   INDICATION OF PROCEDURE:  Mr LDoyonis a pleasant 88year old male who lives independently.  He unfortunately had a fall [place] landing directly on his knee.  He was admitted to the hospital after radiographs revealed a femoral neck fracture.  Additionally, based on his mechanism of injury by landing on his knee and given normal appearing X-rays, I ordered a CT scan which revealed a non-displaced proximal tibia fracture with lipohemarthrosis,  He was seen and evaluated and was scheduled for surgery for management of his hip fracture by means of hemiarthroplasty as well as to aspirate his knee to help with pain associated with the swelling.  The necessity of surgical repair was discussed with him and his daughter.  Consent was obtained after reviewing risks of infection, DVT, component failure, and need for revision surgery.   PROCEDURE IN DETAIL:  The patient was brought to the operative theater. Once adequate anesthesia, preoperative antibiotics, 2 g of Ancef administered, the patient was  initially positioned supine on his hospital bed.  A timeout was performed in to find the patient planned procedure in the left knee extremity for aspiration.  I then cleaned the lateral aspect of his proximal knee with Betadine.  I then used an 18-gauge needle and aspirated 90 cc of hemarthrosis from the knee.  Once this was completed we worked to transition him to the operative bed.    He was then positioned into the right lateral decubitus position with the left side up.  The left lower extremity was then prepped and draped in sterile fashion.  A time-out was performed identifying the patient, planned procedure, and extremity.   A lateral incision was made off the proximal trochanter. Sharp dissection was carried down to the iliotibial band and gluteal fascia. The gluteal fascia was then incised for posterior approach.  The short external rotators were taken down separate from the posterior capsule. An L capsulotomy was made preserving the posterior leaflet for later anatomic repair. Fracture site was identified and after removing comminuted segments of the posterior femoral neck, the femoral head was removed without difficulty and measured on the back table  using the sizing rings and  determined to be 53 mm in diameter.   The proximal femur was then exposed.  Retractors placed.  I then drilled, opened the proximal femur.  Then I hand reamed once and  Irrigated the canal to try to prevent fat emboli.  I began broaching the femur with a starter broach up to a size 9 broach with good medial and lateral metaphyseal fit without evidence of any torsion or movement.  A trial reduction was carried out with a high offset neck and a +5 adapter with a 53 mm ball.  The hip reduced nicely.  The leg lengths appeared to be equal compared to the down leg.   The hip went through a range of motion without evidence of any subluxation or impingement.   Given these findings, the trial components removed.   The final 9 Hi Actis stem was opened.  After irrigating the canal, the final stem was impacted and sat at the level where the broach was. Based on this and the trial reduction, a +5 adapter was opened and impacted in the 53 mm unipolar ball onto a clean and dry trunnion.  The hip had been irrigated throughout the case and again at this point.  I re- Approximated the posterior capsule to the superior leaflet using a  #1 Vicryl.  The remainder of the wound was closed with #1 Vicryl in the iliotibial band and gluteal fascia, a  2-0 Vicryl in the sub-Q tissue and a running 4-0 Monocryl in the skin.  The hip was cleaned, dried, and dressed sterilely using Dermabond and Aquacel dressing.  He was then brought to recovery room, extubated in stable condition, tolerating the procedure well. At the end of the case we wrapped his knee in an Ace wrap for some compression.  Postoperatively I will probably allow him to be touchdown weightbearing left lower extremity.  We will have him work with physical therapy determine his needs for disposition standpoint.  Costella Hatcher, PA-C was present and utilized as Environmental consultant for the entire case from  Preoperative positioning to management of the contralateral extremity and retractors to  General facilitation of the procedure.  He was also involved with primary wound closure.         Pietro Cassis Alvan Dame, M.D.

## 2021-12-15 NOTE — Discharge Instructions (Addendum)
INSTRUCTIONS AFTER JOINT REPLACEMENT   Remove items at home which could result in a fall. This includes throw rugs or furniture in walking pathways ICE to the affected joint every three hours while awake for 30 minutes at a time, for at least the first 3-5 days, and then as needed for pain and swelling.  Continue to use ice for pain and swelling. You may notice swelling that will progress down to the foot and ankle.  This is normal after surgery.  Elevate your leg when you are not up walking on it.   Continue to use the breathing machine you got in the hospital (incentive spirometer) which will help keep your temperature down.  It is common for your temperature to cycle up and down following surgery, especially at night when you are not up moving around and exerting yourself.  The breathing machine keeps your lungs expanded and your temperature down.   DIET:  As you were doing prior to hospitalization, we recommend a well-balanced diet.  DRESSING / WOUND CARE / SHOWERING  Keep the surgical dressing until follow up.  The dressing is water proof, so you can shower without any extra covering.  IF THE DRESSING FALLS OFF or the wound gets wet inside, change the dressing with sterile gauze.  Please use good hand washing techniques before changing the dressing.  Do not use any lotions or creams on the incision until instructed by your surgeon.    ACTIVITY  Increase activity slowly as tolerated, but follow the weight bearing instructions below.   No driving for 6 weeks or until further direction given by your physician.  You cannot drive while taking narcotics.  No lifting or carrying greater than 10 lbs. until further directed by your surgeon. Avoid periods of inactivity such as sitting longer than an hour when not asleep. This helps prevent blood clots.  You may return to work once you are authorized by your doctor.     WEIGHT BEARING   Other:  Touch down weight bearing of the left lower  extremity.     CONSTIPATION  Constipation is defined medically as fewer than three stools per week and severe constipation as less than one stool per week.  Even if you have a regular bowel pattern at home, your normal regimen is likely to be disrupted due to multiple reasons following surgery.  Combination of anesthesia, postoperative narcotics, change in appetite and fluid intake all can affect your bowels.   YOU MUST use at least one of the following options; they are listed in order of increasing strength to get the job done.  They are all available over the counter, and you may need to use some, POSSIBLY even all of these options:    Drink plenty of fluids (prune juice may be helpful) and high fiber foods Colace 100 mg by mouth twice a day  Senokot for constipation as directed and as needed Dulcolax (bisacodyl), take with full glass of water  Miralax (polyethylene glycol) once or twice a day as needed.  If you have tried all these things and are unable to have a bowel movement in the first 3-4 days after surgery call either your surgeon or your primary doctor.    If you experience loose stools or diarrhea, hold the medications until you stool forms back up.  If your symptoms do not get better within 1 week or if they get worse, check with your doctor.  If you experience "the worst abdominal pain ever" or  develop nausea or vomiting, please contact the office immediately for further recommendations for treatment.   ITCHING:  If you experience itching with your medications, try taking only a single pain pill, or even half a pain pill at a time.  You can also use Benadryl over the counter for itching or also to help with sleep.   TED HOSE STOCKINGS:  Use stockings on both legs until for at least 2 weeks or as directed by physician office. They may be removed at night for sleeping.  MEDICATIONS:  See your medication summary on the "After Visit Summary" that nursing will review with you.  You  may have some home medications which will be placed on hold until you complete the course of blood thinner medication.  It is important for you to complete the blood thinner medication as prescribed.  PRECAUTIONS:  If you experience chest pain or shortness of breath - call 911 immediately for transfer to the hospital emergency department.   If you develop a fever greater that 101 F, purulent drainage from wound, increased redness or drainage from wound, foul odor from the wound/dressing, or calf pain - CONTACT YOUR SURGEON.                                                   FOLLOW-UP APPOINTMENTS:  If you do not already have a post-op appointment, please call the office for an appointment to be seen by your surgeon.  Guidelines for how soon to be seen are listed in your "After Visit Summary", but are typically between 1-4 weeks after surgery.  OTHER INSTRUCTIONS:   Knee Replacement:  Do not place pillow under knee, focus on keeping the knee straight while resting. CPM instructions: 0-90 degrees, 2 hours in the morning, 2 hours in the afternoon, and 2 hours in the evening. Place foam block, curve side up under heel at all times except when in CPM or when walking.  DO NOT modify, tear, cut, or change the foam block in any way.  POST-OPERATIVE OPIOID TAPER INSTRUCTIONS: It is important to wean off of your opioid medication as soon as possible. If you do not need pain medication after your surgery it is ok to stop day one. Opioids include: Codeine, Hydrocodone(Norco, Vicodin), Oxycodone(Percocet, oxycontin) and hydromorphone amongst others.  Long term and even short term use of opiods can cause: Increased pain response Dependence Constipation Depression Respiratory depression And more.  Withdrawal symptoms can include Flu like symptoms Nausea, vomiting And more Techniques to manage these symptoms Hydrate well Eat regular healthy meals Stay active Use relaxation techniques(deep breathing,  meditating, yoga) Do Not substitute Alcohol to help with tapering If you have been on opioids for less than two weeks and do not have pain than it is ok to stop all together.  Plan to wean off of opioids This plan should start within one week post op of your joint replacement. Maintain the same interval or time between taking each dose and first decrease the dose.  Cut the total daily intake of opioids by one tablet each day Next start to increase the time between doses. The last dose that should be eliminated is the evening dose.   MAKE SURE YOU:  Understand these instructions.  Get help right away if you are not doing well or get worse.    Thank you  for letting us be a part of your medical care team.  It is a privilege we respect greatly.  We hope these instructions will help you stay on track for a fast and full recovery!

## 2021-12-15 NOTE — Interval H&P Note (Signed)
History and Physical Interval Note:  12/15/2021 6:54 PM  Adam Richards  has presented today for surgery, with the diagnosis of LEFT FEMORAL NECK FRACTURE.  The various methods of treatment have been discussed with the patient and family. After consideration of risks, benefits and other options for treatment, the patient has consented to  Procedure(s): ARTHROPLASTY BIPOLAR HIP (HEMIARTHROPLASTY) (Left) as a surgical intervention.  The patient's history has been reviewed, patient examined, no change in status, stable for surgery.  I have reviewed the patient's chart and labs.  Questions were answered to the patient's satisfaction.     Mauri Pole

## 2021-12-15 NOTE — Progress Notes (Signed)
Patient ID: Adam Richards, male   DOB: 1927/02/02, 88 y.o.   MRN: [id number]  No events overnight Ready for OR INR 1.5  Plan is to go to OR this afternoon for left hip hemiarthroplasty Given the findings on the CT scan of his knee I will also plan to aspirate his knee  Post op orders will follow for PT incorporating findings on knee Will resume Coumadin per pharmacy post op  Diagnosis: Left femoral neck fracture Closed non-displaced proximal tibia fracture (non-op management)

## 2021-12-15 NOTE — Plan of Care (Signed)
  Problem: Clinical Measurements: Goal: Postoperative complications will be avoided or minimized Outcome: Progressing   Problem: Self-Concept: Goal: Ability to maintain and perform role responsibilities to the fullest extent possible will improve Outcome: Progressing   Problem: Pain Management: Goal: Pain level will decrease Outcome: Progressing

## 2021-12-15 NOTE — Anesthesia Preprocedure Evaluation (Signed)
Anesthesia Evaluation  Patient identified by MRN, date of birth, ID band Patient awake    Reviewed: Allergy & Precautions, H&P , NPO status , Patient's Chart, lab work & pertinent test results  Airway Mallampati: II   Neck ROM: full    Dental   Pulmonary Current Smoker,    breath sounds clear to auscultation       Cardiovascular hypertension, + dysrhythmias Atrial Fibrillation  Rhythm:regular Rate:Normal  INR 1.5 today   Neuro/Psych    GI/Hepatic   Endo/Other    Renal/GU      Musculoskeletal   Abdominal   Peds  Hematology   Anesthesia Other Findings   Reproductive/Obstetrics                             Anesthesia Physical Anesthesia Plan  ASA: 3  Anesthesia Plan: General   Post-op Pain Management:    Induction: Intravenous  PONV Risk Score and Plan: 1 and Ondansetron, Dexamethasone and Treatment may vary due to age or medical condition  Airway Management Planned: Oral ETT  Additional Equipment:   Intra-op Plan:   Post-operative Plan: Extubation in OR  Informed Consent: I have reviewed the patients History and Physical, chart, labs and discussed the procedure including the risks, benefits and alternatives for the proposed anesthesia with the patient or authorized representative who has indicated his/her understanding and acceptance.     Dental advisory given  Plan Discussed with: CRNA, Anesthesiologist and Surgeon  Anesthesia Plan Comments:         Anesthesia Quick Evaluation

## 2021-12-15 NOTE — Transfer of Care (Signed)
Immediate Anesthesia Transfer of Care Note  Patient: Adam Richards  Procedure(s) Performed: ARTHROPLASTY BIPOLAR HIP (HEMIARTHROPLASTY) (Left: Hip) LEFT KNEE ASPIRATION FOR LIPOHEMARTHROSIS (Left: Knee)  Patient Location: PACU  Anesthesia Type:General  Level of Consciousness: sedated  Airway & Oxygen Therapy: Patient Spontanous Breathing and Patient connected to face mask oxygen  Post-op Assessment: Report given to RN and Post -op Vital signs reviewed and stable  Post vital signs: Reviewed and stable  Last Vitals:  Vitals Value Taken Time  BP    Temp    Pulse    Resp    SpO2      Last Pain:  Vitals:   12/15/21 1652  TempSrc: Oral  PainSc: 0-No pain      Patients Stated Pain Goal: 2 (62/26/33 3545)  Complications:  Encounter Notable Events  Notable Event Outcome Phase Comment  Difficult to intubate - expected  Intraprocedure Filed from anesthesia note documentation.

## 2021-12-16 ENCOUNTER — Encounter (HOSPITAL_COMMUNITY): Payer: Self-pay | Admitting: Orthopedic Surgery

## 2021-12-16 DIAGNOSIS — Z8781 Personal history of (healed) traumatic fracture: Secondary | ICD-10-CM | POA: Diagnosis not present

## 2021-12-16 DIAGNOSIS — N400 Enlarged prostate without lower urinary tract symptoms: Secondary | ICD-10-CM

## 2021-12-16 DIAGNOSIS — I482 Chronic atrial fibrillation, unspecified: Secondary | ICD-10-CM | POA: Diagnosis not present

## 2021-12-16 DIAGNOSIS — Z96649 Presence of unspecified artificial hip joint: Secondary | ICD-10-CM

## 2021-12-16 DIAGNOSIS — D638 Anemia in other chronic diseases classified elsewhere: Secondary | ICD-10-CM

## 2021-12-16 DIAGNOSIS — I10 Essential (primary) hypertension: Secondary | ICD-10-CM

## 2021-12-16 DIAGNOSIS — D7589 Other specified diseases of blood and blood-forming organs: Secondary | ICD-10-CM

## 2021-12-16 DIAGNOSIS — D696 Thrombocytopenia, unspecified: Secondary | ICD-10-CM

## 2021-12-16 DIAGNOSIS — S82142A Displaced bicondylar fracture of left tibia, initial encounter for closed fracture: Secondary | ICD-10-CM

## 2021-12-16 LAB — CBC WITH DIFFERENTIAL/PLATELET
Abs Immature Granulocytes: 0.15 10*3/uL — ABNORMAL HIGH (ref 0.00–0.07)
Basophils Absolute: 0 10*3/uL (ref 0.0–0.1)
Basophils Relative: 0 %
Eosinophils Absolute: 0 10*3/uL (ref 0.0–0.5)
Eosinophils Relative: 0 %
HCT: 30.9 % — ABNORMAL LOW (ref 39.0–52.0)
Hemoglobin: 10.3 g/dL — ABNORMAL LOW (ref 13.0–17.0)
Immature Granulocytes: 2 %
Lymphocytes Relative: 18 %
Lymphs Abs: 1.7 10*3/uL (ref 0.7–4.0)
MCH: 34.1 pg — ABNORMAL HIGH (ref 26.0–34.0)
MCHC: 33.3 g/dL (ref 30.0–36.0)
MCV: 102.3 fL — ABNORMAL HIGH (ref 80.0–100.0)
Monocytes Absolute: 0.4 10*3/uL (ref 0.1–1.0)
Monocytes Relative: 5 %
Neutro Abs: 7.2 10*3/uL (ref 1.7–7.7)
Neutrophils Relative %: 75 %
Platelets: 107 10*3/uL — ABNORMAL LOW (ref 150–400)
RBC: 3.02 MIL/uL — ABNORMAL LOW (ref 4.22–5.81)
RDW: 14 % (ref 11.5–15.5)
WBC: 9.4 10*3/uL (ref 4.0–10.5)
nRBC: 0 % (ref 0.0–0.2)

## 2021-12-16 LAB — COMPREHENSIVE METABOLIC PANEL
ALT: 14 U/L (ref 0–44)
AST: 18 U/L (ref 15–41)
Albumin: 3 g/dL — ABNORMAL LOW (ref 3.5–5.0)
Alkaline Phosphatase: 55 U/L (ref 38–126)
Anion gap: 8 (ref 5–15)
BUN: 19 mg/dL (ref 8–23)
CO2: 25 mmol/L (ref 22–32)
Calcium: 8.6 mg/dL — ABNORMAL LOW (ref 8.9–10.3)
Chloride: 103 mmol/L (ref 98–111)
Creatinine, Ser: 0.79 mg/dL (ref 0.61–1.24)
GFR, Estimated: >60 mL/min (ref >60)
Glucose, Bld: 170 mg/dL — ABNORMAL HIGH (ref 70–99)
Potassium: 4.4 mmol/L (ref 3.5–5.1)
Sodium: 136 mmol/L (ref 135–145)
Total Bilirubin: 0.5 mg/dL (ref 0.3–1.2)
Total Protein: 5.9 g/dL — ABNORMAL LOW (ref 6.5–8.1)

## 2021-12-16 LAB — PROTIME-INR
INR: 1.4 — ABNORMAL HIGH (ref 0.8–1.2)
Prothrombin Time: 16.7 seconds — ABNORMAL HIGH (ref 11.4–15.2)

## 2021-12-16 LAB — MAGNESIUM: Magnesium: 2.1 mg/dL (ref 1.7–2.4)

## 2021-12-16 LAB — PHOSPHORUS: Phosphorus: 3.9 mg/dL (ref 2.5–4.6)

## 2021-12-16 MED ORDER — METOPROLOL TARTRATE 25 MG PO TABS
37.5000 mg | ORAL_TABLET | Freq: Two times a day (BID) | ORAL | Status: DC
  Administered 2021-12-16 – 2021-12-18 (×4): 37.5 mg via ORAL
  Filled 2021-12-16 (×4): qty 2

## 2021-12-16 MED ORDER — WARFARIN SODIUM 4 MG PO TABS
4.0000 mg | ORAL_TABLET | Freq: Once | ORAL | Status: AC
  Administered 2021-12-16: 4 mg via ORAL
  Filled 2021-12-16: qty 1

## 2021-12-16 MED ORDER — WARFARIN - PHARMACIST DOSING INPATIENT: Freq: Every day | Status: DC

## 2021-12-16 NOTE — Evaluation (Signed)
Physical Therapy Evaluation Patient Details Name: Kaiyan Luczak MRN: [id number] DOB: Mar 06, 1927 Today's Date: 12/16/2021  History of Present Illness  88 y.o. male presenting to [facility] ED after a witnessed fall while ascending steps. Landed on L knee. Imaging (+) for L displaced femoral neck fx, acute nondisplaced L proximal tibial plateau fx s/p L hemiarthroplasty and L knee aspiration for lipohemarthrosis on 12/15/21. PWB LLE 25-50%.  Clinical Impression  Patient is s/p above surgery resulting in functional limitations due to the deficits listed below (see PT Problem List).  Patient will benefit from skilled PT to increase their independence and safety with mobility to allow discharge to the venue listed below.  Pt very pleasant and motivated.  Pt assisted back to bed and able to perform standing and maintain precaution with elevated bed surface.  Pt agrees he cannot return to ILF in current condition and agreeable to SNF upon d/c for post acute rehab.        Recommendations for follow up therapy are one component of a multi-disciplinary discharge planning process, led by the attending physician.  Recommendations may be updated based on patient status, additional functional criteria and insurance authorization.  Follow Up Recommendations Skilled nursing-short term rehab (<3 hours/day) Can patient physically be transported by private vehicle: No    Assistance Recommended at Discharge Frequent or constant Supervision/Assistance  Patient can return home with the following  Two people to help with walking and/or transfers;Two people to help with bathing/dressing/bathroom    Equipment Recommendations None recommended by PT  Recommendations for Other Services       Functional Status Assessment Patient has had a recent decline in their functional status and demonstrates the ability to make significant improvements in function in a reasonable and predictable amount of time.     Precautions /  Restrictions Precautions Precautions: Fall Precaution Comments: no hip precautions (per Caryl Pina, [place]) Restrictions Weight Bearing Restrictions: Yes LLE Weight Bearing: Touchdown weight bearing LLE Partial Weight Bearing Percentage or Pounds: 25-50% per ortho note Other Position/Activity Restrictions: No noted ROM restrictions at L knee per ortho note      Mobility  Bed Mobility Overal bed mobility: Needs Assistance Bed Mobility: Sit to Supine       Sit to supine: Mod assist, +2 for safety/equipment   General bed mobility comments: assist for LEs onto bed    Transfers Overall transfer level: Needs assistance Equipment used: Rolling walker (2 wheels) Transfers: Sit to/from Stand, Bed to chair/wheelchair/BSC Sit to Stand: Mod assist, +2 physical assistance, From elevated surface          Lateral/Scoot Transfers: Mod assist, +2 physical assistance General transfer comment: pt in recliner on arrival and recliner too low for pt to maintain TDWB precautions and rise so assist pt with scooting over into bed and then performed 2 sit to stands from elevated bed with cues for UE and LE positioning and pt able to maintain TDWB with elevated bed surface    Ambulation/Gait                  Stairs            Wheelchair Mobility    Modified Rankin (Stroke Patients Only)       Balance Overall balance assessment: Needs assistance         Standing balance support: Bilateral upper extremity supported, Reliant on assistive device for balance Standing balance-Leahy Scale: Poor  Pertinent Vitals/Pain Pain Assessment Pain Assessment: Faces Faces Pain Scale: Hurts even more Pain Location: L hip and knee Pain Descriptors / Indicators: Grimacing, Discomfort, Sore Pain Intervention(s): Repositioned, Monitored during session, Ice applied    Home Living Family/patient expects to be discharged to:: [place]: Conservation officer, nature (2 wheels);Cane - single point;Rollator (4 wheels);BSC/3in1;Grab bars - toilet;Grab bars - tub/shower Additional Comments: [facility]    Prior Function Prior Level of Function : Independent/Modified Independent;Driving             Mobility Comments: Household and community mobility without AD ADLs Comments: Independent to Mod I     Hand Dominance   Dominant Hand: Right    Extremity/Trunk Assessment        Lower Extremity Assessment Lower Extremity Assessment: Generalized weakness;LLE deficits/detail LLE Deficits / Details: able to perform ankle pumps, pt self assists L LE due to pain    Cervical / Trunk Assessment Cervical / Trunk Assessment: Kyphotic  Communication   Communication: HOH ((B) hearing aids)  Cognition Arousal/Alertness: Awake/alert Behavior During Therapy: WFL for tasks assessed/performed Overall Cognitive Status: Within Functional Limits for tasks assessed                                          General Comments      Exercises     Assessment/Plan    PT Assessment Patient needs continued PT services  PT Problem List Decreased strength;Decreased activity tolerance;Decreased mobility;Decreased balance;Pain;Decreased knowledge of precautions;Decreased knowledge of use of DME       PT Treatment Interventions Gait training;DME instruction;Therapeutic exercise;Functional mobility training;Therapeutic activities;Patient/family education    PT Goals (Current goals can be found in the Care Plan section)  Acute Rehab PT Goals PT Goal Formulation: With patient Time For Goal Achievement: 12/31/21 Potential to Achieve Goals: Good    Frequency Min 3X/week     Co-evaluation               AM-PAC PT "6 Clicks" Mobility  Outcome Measure Help needed turning from your back to your side while in a flat bed without using bedrails?: A Lot Help needed moving from lying on your back  to sitting on the side of a flat bed without using bedrails?: A Lot Help needed moving to and from a bed to a chair (including a wheelchair)?: Total Help needed standing up from a chair using your arms (e.g., wheelchair or bedside chair)?: Total Help needed to walk in hospital room?: Total Help needed climbing 3-5 steps with a railing? : Total 6 Click Score: 8    End of Session Equipment Utilized During Treatment: Gait belt Activity Tolerance: Patient tolerated treatment well Patient left: in bed;with call bell/phone within reach;with nursing/sitter in room   PT Visit Diagnosis: Difficulty in walking, not elsewhere classified (R26.2)    Time: 6440-3474 PT Time Calculation (min) (ACUTE ONLY): 20 min   Charges:   PT Evaluation $PT Eval Low Complexity: 1 Low        Kati PT, DPT Physical Therapist Acute Rehabilitation Services Preferred contact method: Secure Chat Weekend Pager Only: [phone] Office: [place] 12/16/2021, 3:19 PM

## 2021-12-16 NOTE — Progress Notes (Signed)
Patient ID: Adam Richards, male   DOB: March 05, 1927, 88 y.o.   MRN: [id number] Subjective: 1 Day Post-Op Procedure(s) (LRB): ARTHROPLASTY BIPOLAR HIP (HEMIARTHROPLASTY) (Left) LEFT KNEE ASPIRATION FOR LIPOHEMARTHROSIS (Left)    Patient reports pain as mild at this point this morning. No events reported or noted  Objective:   VITALS:   Vitals:   12/16/21 0400 12/16/21 0600  BP: 129/67 (!) 118/92  Pulse: 67 (!) 43  Resp: 14 14  Temp:    SpO2: 100% 99%    Neurovascular intact Incision: dressing C/D/I - left knee ACE wrap for compression Left hip dressing dry  LABS Recent Labs    12/13/21 1550 12/14/21 0454 12/16/21 0448  HGB 12.0* 10.8* 10.3*  HCT 36.1* 31.8* 30.9*  WBC 10.2 7.8 9.4  PLT 156 128* 107*    Recent Labs    12/13/21 1550 12/14/21 0454 12/16/21 0448  NA 135 135 136  K 3.7 4.3 4.4  BUN '20 21 19  '$ CREATININE 0.81 0.86 0.79  GLUCOSE 106* 122* 170*    Recent Labs    12/15/21 0436 12/16/21 0448  INR 1.5* 1.4*     Assessment/Plan: 1 Day Post-Op Procedure(s) (LRB): ARTHROPLASTY BIPOLAR HIP (HEMIARTHROPLASTY) (Left) LEFT KNEE ASPIRATION FOR LIPOHEMARTHROSIS (Left)   Advance diet Up with therapy - TDWB (25-50%) to protect knee ACE wrap to knee for compression and comfort Ice to knee and hip as needed Resume coumadin per pharmacy for a-fib and DVT prophylaxis Norco for pain

## 2021-12-16 NOTE — TOC Progression Note (Signed)
Transition of Care [facility]) - Progression Note    Patient Details  Name: Haji Delaine MRN: [id number] Date of Birth: 1926-11-30  Transition of Care [facility]) CM/SW Contact  Ariyan Brisendine, Juliann Pulse, RN Phone Number: 12/16/2021, 3:05 PM  Clinical Narrative:  PT recc ST SNF-patient dtr agree. Faxed out await bed offers,then chioice.     Expected Discharge Plan: [place] Barriers to Discharge: Continued Medical Work up  Expected Discharge Plan and Services Expected Discharge Plan: [place]   Discharge Planning Services: CM Consult   Living arrangements for the past 2 months: [place]                                       Social Determinants of Health (SDOH) Interventions    Readmission Risk Interventions     No data to display

## 2021-12-16 NOTE — Progress Notes (Signed)
PROGRESS NOTE    Adam Richards  UXN:[id number] DOB: 11-Sep-1926 DOA: 12/13/2021 PCP: Wenda Low, MD   Brief Narrative:  88 y.o. male with medical history significant of atrial fibrillation on coumadin, hypertension, hyperlipidemia, and benign prostatic hypertrophy presented with a mechanical fall and was found to have left hip fracture and left tibia-fibula fracture.  He underwent surgical intervention of the left hip on 12/15/2021.  Assessment & Plan:   Left hip fracture status post mechanical fall -Status post surgical intervention of the left hip on 12/15/2021.  Wound care/activity/DVT prophylaxis/pain management as per orthopedics recommendations.  Orthopedics recommending to resume Coumadin. -Fall precautions -PT eval  Left tibial plateau fracture with lipohemarthrosis -Status post left knee aspiration by orthopedics on 12/15/2021.  Orthopedics following.  Currently has Ace wrap to knee  Supratherapeutic INR -Received vitamin K on 12/14/2021.  INR 1.4 today.  Repeat a.m. INR.  Chronic A-fib -Coumadin to be resumed today as per orthopedics recommendations.  Dose of Coumadin as per pharmacy -Continue metoprolol.  Intermittently tachycardic.  Thrombocytopenia -Questionable cause.  No signs of bleeding.  Monitor  Anemia of chronic disease Macrocytosis -Hemoglobin stable.  Monitor intermittently  Dyslipidemia -continue statin on discharge  Essential hypertension -blood pressure stable.  Continue metoprolol  BPH -Continue tamsulosin and Avodart   DVT prophylaxis: Coumadin Code Status: Full Family Communication: None at bedside Disposition Plan: Status is: Inpatient Remains inpatient appropriate because: Of severity of illness.  Need for PT eval.  Might need SNF placement.    Consultants: Orthopedics  Procedures: Left hip hemiarthroplasty and left knee aspiration on 12/15/2021 by orthopedics  Antimicrobials: Perioperative   Subjective: Patient seen and examined  at bedside.  Denies worsening knee or hip pain.  No overnight fever, vomiting, chest pain reported.  Objective: Vitals:   12/16/21 0300 12/16/21 0400 12/16/21 0600 12/16/21 0828  BP: 131/81 129/67 (!) 118/92 130/74  Pulse: 74 67 (!) 43 84  Resp: '12 14 14 14  '$ Temp:    98.1 F (36.7 C)  TempSrc:    Oral  SpO2: 98% 100% 99% 96%  Weight:      Height:        Intake/Output Summary (Last 24 hours) at 12/16/2021 0959 Last data filed at 12/16/2021 0610 Gross per 24 hour  Intake 2290.6 ml  Output 625 ml  Net 1665.6 ml   Filed Weights   12/14/21 1000  Weight: 84.5 kg    Examination:  General exam: Appears calm and comfortable.  Currently on room air.  Elderly male lying in bed. Respiratory system: Bilateral decreased breath sounds at bases Cardiovascular system: S1 & S2 heard, intermittently bradycardic and tachycardic  gastrointestinal system: Abdomen is nondistended, soft and nontender. Normal bowel sounds heard. Extremities: No cyanosis, clubbing.  Left knee Ace wrap present.  Left hip dressing present Central nervous system: Alert and oriented. No focal neurological deficits. Moving extremities Skin: No rashes, lesions or ulcers Psychiatry: Judgement and insight appear normal. Mood & affect appropriate.     Data Reviewed: I have personally reviewed following labs and imaging studies  CBC: Recent Labs  Lab 12/13/21 1550 12/14/21 0454 12/16/21 0448  WBC 10.2 7.8 9.4  NEUTROABS  --   --  7.2  HGB 12.0* 10.8* 10.3*  HCT 36.1* 31.8* 30.9*  MCV 102.0* 101.6* 102.3*  PLT 156 128* 254*   Basic Metabolic Panel: Recent Labs  Lab 12/13/21 1550 12/14/21 0454 12/16/21 0448  NA 135 135 136  K 3.7 4.3 4.4  CL 100 103 103  CO2 '27 26 25  '$ GLUCOSE 106* 122* 170*  BUN '20 21 19  '$ CREATININE 0.81 0.86 0.79  CALCIUM 9.0 8.7* 8.6*  MG  --   --  2.1  PHOS  --   --  3.9   GFR: Estimated Creatinine Clearance: 60.8 mL/min (by C-G formula based on SCr of 0.79 mg/dL). Liver  Function Tests: Recent Labs  Lab 12/16/21 0448  AST 18  ALT 14  ALKPHOS 55  BILITOT 0.5  PROT 5.9*  ALBUMIN 3.0*   No results for input(s): "LIPASE", "AMYLASE" in the last 168 hours. No results for input(s): "AMMONIA" in the last 168 hours. Coagulation Profile: Recent Labs  Lab 12/13/21 1550 12/14/21 0454 12/14/21 1804 12/15/21 0436 12/16/21 0448  INR 2.7* 3.1* 1.9* 1.5* 1.4*   Cardiac Enzymes: No results for input(s): "CKTOTAL", "CKMB", "CKMBINDEX", "TROPONINI" in the last 168 hours. BNP (last 3 results) No results for input(s): "PROBNP" in the last 8760 hours. HbA1C: No results for input(s): "HGBA1C" in the last 72 hours. CBG: No results for input(s): "GLUCAP" in the last 168 hours. Lipid Profile: No results for input(s): "CHOL", "HDL", "LDLCALC", "TRIG", "CHOLHDL", "LDLDIRECT" in the last 72 hours. Thyroid Function Tests: No results for input(s): "TSH", "T4TOTAL", "FREET4", "T3FREE", "THYROIDAB" in the last 72 hours. Anemia Panel: No results for input(s): "VITAMINB12", "FOLATE", "FERRITIN", "TIBC", "IRON", "RETICCTPCT" in the last 72 hours. Sepsis Labs: No results for input(s): "PROCALCITON", "LATICACIDVEN" in the last 168 hours.  Recent Results (from the past 240 hour(s))  MRSA Next Gen by PCR, Nasal     Status: None   Collection Time: 12/15/21  9:03 AM   Specimen: Nasal Mucosa; Nasal Swab  Result Value Ref Range Status   MRSA by PCR Next Gen NOT DETECTED NOT DETECTED Final    Comment: (NOTE) The GeneXpert MRSA Assay (FDA approved for NASAL specimens only), is one component of a comprehensive MRSA colonization surveillance program. It is not intended to diagnose MRSA infection nor to guide or monitor treatment for MRSA infections. Test performance is not FDA approved in patients less than 7 years old. Performed at [facility], [place] [address], [place], [place] [address]          Radiology Studies: DG Pelvis Portable  Result Date:  12/15/2021 CLINICAL DATA:  Postop total left hip arthroplasty. EXAM: PORTABLE PELVIS 1-2 VIEWS COMPARISON:  Left hip radiographs and CT pelvis 12/13/2021. FINDINGS: 2134 hours. The upper pelvis is excluded. Patient has undergone interval left hip unipolar hemiarthroplasty. The hardware is well positioned, and there is no evidence of acute fracture or dislocation. Mild right hip degenerative changes are noted. There is gas in the left hip joint and surrounding soft tissues. IMPRESSION: No demonstrated complication following left hip hemiarthroplasty. Electronically Signed   By: Richardean Sale M.D.   On: 12/15/2021 21:58   CT KNEE LEFT WO CONTRAST  Result Date: 12/14/2021 CLINICAL DATA:  Status post fall yesterday, left knee pain EXAM: CT OF THE LEFT KNEE WITHOUT CONTRAST TECHNIQUE: Multidetector CT imaging of the left knee was performed according to the standard protocol. Multiplanar CT image reconstructions were also generated. RADIATION DOSE REDUCTION: This exam was performed according to the departmental dose-optimization program which includes automated exposure control, adjustment of the mA and/or kV according to patient size and/or use of iterative reconstruction technique. COMPARISON:  12/13/2021 FINDINGS: Bones/Joint/Cartilage Severe osteopenia. Nondisplaced fracture of the medial tibial plateau involving the articular surface with 1 mm of depression of the central articular surface. No other  fracture or dislocation. Large lipohemarthrosis. Mild osteoarthritis of the patellofemoral compartment. 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 Small loose bodies in the posterior joint space and along the anterior margin of the medial tibia. Ligaments Ligaments are suboptimally evaluated by CT. Muscles and Tendons Muscles are normal. No muscle atrophy. No intramuscular fluid collection or hematoma. Quadriceps tendon and patellar tendon are intact. Soft tissue No fluid  collection or hematoma. No soft tissue mass. Peripheral vascular atherosclerotic disease. IMPRESSION: 1. Acute nondisplaced fracture of the medial tibial plateau involving the articular surface with 1 mm of depression of the central articular surface. 2. Large lipohemarthrosis. Electronically Signed   By: Kathreen Devoid M.D.   On: 12/14/2021 12:43        Scheduled Meds:  docusate sodium  100 mg Oral BID   dutasteride  0.5 mg Oral Daily   feeding supplement  237 mL Oral BID BM   ferrous sulfate  325 mg Oral TID PC   metoprolol tartrate  25 mg Oral BID   multivitamin with minerals  1 tablet Oral Daily   senna  1 tablet Oral BID   tamsulosin  0.4 mg Oral Daily   warfarin  4 mg Oral Once   Warfarin - Pharmacist Dosing Inpatient   Does not apply q1600   Continuous Infusions:  methocarbamol (ROBAXIN) IV Stopped (12/15/21 2155)          Aline August, MD [facility] 12/16/2021, 9:59 AM

## 2021-12-16 NOTE — Plan of Care (Signed)
  Problem: Health Behavior/Discharge Planning: Goal: Ability to manage health-related needs will improve Outcome: Progressing   Problem: Clinical Measurements: Goal: Ability to maintain clinical measurements within normal limits will improve Outcome: Progressing Goal: Diagnostic test results will improve Outcome: Progressing Goal: Cardiovascular complication will be avoided Outcome: Completed/Met   Problem: Activity: Goal: Risk for activity intolerance will decrease Outcome: Progressing   Problem: Nutrition: Goal: Adequate nutrition will be maintained Outcome: Progressing   Problem: Coping: Goal: Level of anxiety will decrease Outcome: Completed/Met

## 2021-12-16 NOTE — Care Management Important Message (Signed)
Important Message  Patient Details IM Letter placed in Patients room. Name: Adam Richards MRN: [id number] Date of Birth: Jul 02, 1926   Medicare Important Message Given:  Yes     Kerin Salen 12/16/2021, 12:07 PM

## 2021-12-16 NOTE — Progress Notes (Signed)
ANTICOAGULATION CONSULT NOTE   Pharmacy Consult for Warfarin Indication: history of atrial fibrillation, DVT prophylaxis post-operatively  No Known Allergies  Patient Measurements: Height: '5\' 10"'$  (177.8 cm) Weight: 84.5 kg (186 lb 4.6 oz) IBW/kg (Calculated) : 73  Vital Signs: Temp: 98.1 F (36.7 C) (07/26 0828) Temp Source: Oral (07/26 0828) BP: 130/74 (07/26 0828) Pulse Rate: 84 (07/26 0828)  Labs: Recent Labs    12/13/21 1550 12/14/21 0454 12/14/21 1804 12/15/21 0436 12/16/21 0448  HGB 12.0* 10.8*  --   --  10.3*  HCT 36.1* 31.8*  --   --  30.9*  PLT 156 128*  --   --  107*  LABPROT 28.1* 31.5* 22.0* 18.1* 16.7*  INR 2.7* 3.1* 1.9* 1.5* 1.4*  CREATININE 0.81 0.86  --   --  0.79     Estimated Creatinine Clearance: 60.8 mL/min (by C-G formula based on SCr of 0.79 mg/dL).   Medications:  PTA warfarin regimen: 2.'5mg'$  daily   Assessment: Patient is a 88 y.o M with history of afib on warfarin PTA who presented to the ED on 12/13/21 for evaluation s/p fall.  He was found to have left femoral neck fracture. Warfarin resumed on admission (given one dose of 2.'5mg'$  per MD on 12/13/21), but then held for repair of fractured hip and left knee joint aspiration on 12/15/21. Vitamin K '2mg'$  IV x1 given on 12/14/21 for reversal of INR. Pharmacy consulted on 12/15/21 to resume warfarin post-op. Pharmacist spoke to Costella Hatcher, PA-C from Ortho team, who said to start warfarin on 7/26 in the AM.  Today, 12/16/2021: INR 1.4  CBC: Hgb decreased to 10.3, Pltc decreased to 107K post-op No major drug interactions noted Regular diet ordered  Goal of Therapy:  INR 2-3 Monitor platelets by anticoagulation protocol: Yes   Plan:  Warfarin '4mg'$  PO x 1 today Daily PT/INR Monitor CBC and for s/sx of bleeding   Lindell Spar, PharmD, BCPS Clinical Pharmacist  12/16/2021,8:55 AM

## 2021-12-16 NOTE — Progress Notes (Signed)
Central monitoring informed RN that patient has been in and out of Afib with RVR since 0709 this AM, HR as high as 140s. Prior to that, pt was Afib in 70-80s. Currently, pt HR in  110s-120s.  MD notified.  Patient denies pain, lightheadedness, sitting comfortably in chair talking with son.  Angie Fava, RN

## 2021-12-16 NOTE — Evaluation (Signed)
Occupational Therapy Evaluation Patient Details Name: Adam Richards MRN: [id number] DOB: April 10, 1927 Today's Date: 12/16/2021   History of Present Illness 88 y.o. male presenting to [facility] ED after a witnessed fall while ascending steps. Landed on L knee. Imaging (+) for L displaced femoral neck fx, acute nondisplaced L proximal tibial plateau fx s/p L hemiarthroplasty and L knee aspiration for lipohemarthrosis on 12/15/21. PWB LLE 25-50%.   Clinical Impression   PTA patient was living at [place] in a 2nd floor apartment with elevator access and was grossly independent with ADLs/IADLs including driving. Reports household/community ambulation without AD at baseline. Patient currently functioning below baseline requiring Max-TD with LB ADLs. Unable to stand with +1 assist demonstrating decreased adherence to LLE PWB precautions. Completes lateral scoot transfers with Max A +2. Patient also limited by deficits listed below including LLE pain at hip/knee and generalized weakness and would benefit from continued acute OT services in prep for safe d/c to next level of care with recommendation for SNF rehab. Patient does not wish to d/c to SNF level rehabs at [facility] with preference for another facility. OT will continue to follow acutely.        Recommendations for follow up therapy are one component of a multi-disciplinary discharge planning process, led by the attending physician.  Recommendations may be updated based on patient status, additional functional criteria and insurance authorization.   Follow Up Recommendations  Skilled nursing-short term rehab (<3 hours/day)    Assistance Recommended at Discharge Frequent or constant Supervision/Assistance  Patient can return home with the following Two people to help with walking and/or transfers;Two people to help with bathing/dressing/bathroom    Functional Status Assessment  Patient has had a recent decline in their functional status and demonstrates  the ability to make significant improvements in function in a reasonable and predictable amount of time.  Equipment Recommendations  Other (comment) (Defer to next level of care)    Recommendations for Other Services       Precautions / Restrictions Precautions Precautions: Fall Required Braces or Orthoses: Splint/Cast Splint/Cast: Ace wrap to L knee Splint/Cast - Date Prophylactic Dressing Applied (if applicable): 24/58/09 Restrictions Weight Bearing Restrictions: Yes LLE Weight Bearing: Partial weight bearing LLE Partial Weight Bearing Percentage or Pounds: 25-50% per ortho note Other Position/Activity Restrictions: No noted ROM restrictions at L kne per ortho note      Mobility Bed Mobility Overal bed mobility: Needs Assistance Bed Mobility: Supine to Sit     Supine to sit: Max assist     General bed mobility comments: Max A at trunk and LLE with HOB elevated and use of grab bar.    Transfers Overall transfer level: Needs assistance Equipment used: 2 person hand held assist Transfers: Bed to chair/wheelchair/BSC            Lateral/Scoot Transfers: Max assist, +2 physical assistance, +2 safety/equipment General transfer comment: Max A +2 for lateral scoot to drop arm recliner on R. Cues for hand placement and technique. LLE on this writers foot to ensure adherence to LLE PWB precautions.      Balance Overall balance assessment: Needs assistance Sitting-balance support: Single extremity supported, Feet supported (RLE supported) Sitting balance-Leahy Scale: Good       Standing balance-Leahy Scale: Zero Standing balance comment: Unable with +1 assist                           ADL either performed or assessed with clinical judgement  ADL Overall ADL's : Needs assistance/impaired Eating/Feeding: Set up;Sitting   Grooming: Set up;Sitting               Lower Body Dressing: Minimal assistance;Sit to/from stand   Toilet Transfer: Total  assistance Toilet Transfer Details (indicate cue type and reason): TD to don footwear in supine           General ADL Comments: Grossly Max-TD LB ADLs at bedlevel; +2 for safety     Vision Baseline Vision/History: 1 Wears glasses Ability to See in Adequate Light: 0 Adequate Patient Visual Report: No change from baseline Vision Assessment?: No apparent visual deficits     Perception     Praxis      Pertinent Vitals/Pain Pain Assessment Pain Assessment: 0-10 Pain Score: 3  Pain Location: L hip and knee Pain Descriptors / Indicators: Aching, Constant Pain Intervention(s): Limited activity within patient's tolerance, Monitored during session, Premedicated before session, Repositioned     Hand Dominance Right   Extremity/Trunk Assessment Upper Extremity Assessment Upper Extremity Assessment: Generalized weakness   Lower Extremity Assessment Lower Extremity Assessment: Defer to PT evaluation   Cervical / Trunk Assessment Cervical / Trunk Assessment: Kyphotic   Communication Communication Communication: HOH ((B) hearing aids)   Cognition Arousal/Alertness: Awake/alert Behavior During Therapy: WFL for tasks assessed/performed Overall Cognitive Status: Within Functional Limits for tasks assessed                                       General Comments  Ace wrap and ice on L knee upon entry. Applied ice at conclusion of session. LLE elevated for edema management.    Exercises     Shoulder Instructions      Home Living Family/patient expects to be discharged to:: Assisted living                             Home Equipment: Rolling Walker (2 wheels);Cane - single point;Rollator (4 wheels);BSC/3in1;Grab bars - toilet;Grab bars - tub/shower   Additional Comments: [facility]      Prior Functioning/Environment Prior Level of Function : Independent/Modified Independent;Driving             Mobility Comments: Household and community  mobility without AD ADLs Comments: Independent to Mod I        OT Problem List: Decreased strength      OT Treatment/Interventions: Self-care/ADL training;Therapeutic exercise;Energy conservation;DME and/or AE instruction;Therapeutic activities;Patient/family education;Balance training    OT Goals(Current goals can be found in the care plan section) Acute Rehab OT Goals Patient Stated Goal: To go to SNF rehab (but not at [facility]) OT Goal Formulation: With patient Time For Goal Achievement: 12/30/21 Potential to Achieve Goals: Good ADL Goals Pt Will Perform Upper Body Dressing: with set-up;sitting Pt Will Perform Lower Body Dressing: with mod assist;with adaptive equipment;sit to/from stand Pt Will Transfer to Toilet: with mod assist;bedside commode Pt Will Perform Toileting - Clothing Manipulation and hygiene: with mod assist;sitting/lateral leans Additional ADL Goal #1: Patient will complete sit to stand transfers with Mod A and use of RW with good adherence to PWB on LLE.  OT Frequency: Min 2X/week    Co-evaluation              AM-PAC OT "6 Clicks" Daily Activity     Outcome Measure Help from another person eating meals?: None Help from another person  taking care of personal grooming?: A Little Help from another person toileting, which includes using toliet, bedpan, or urinal?: Total Help from another person bathing (including washing, rinsing, drying)?: Total Help from another person to put on and taking off regular upper body clothing?: A Little Help from another person to put on and taking off regular lower body clothing?: Total 6 Click Score: 13   End of Session Equipment Utilized During Treatment: Gait belt Nurse Communication: Mobility status (Bedpan and +2 assist for lateral scoot transfers)  Activity Tolerance: Patient tolerated treatment well Patient left: in chair;with call bell/phone within reach;with chair alarm set;with nursing/sitter in room  OT Visit  Diagnosis: Unsteadiness on feet (R26.81);Muscle weakness (generalized) (M62.81);History of falling (Z91.81);Pain Pain - Right/Left: Left Pain - part of body: Hip (knee)                Time: 8921-1941 OT Time Calculation (min): 37 min Charges:  OT General Charges $OT Visit: 1 Visit OT Evaluation $OT Eval Moderate Complexity: 1 Mod OT Treatments $Therapeutic Activity: 8-22 mins  Krissi Willaims H. OTR/L Supplemental OT, Department of rehab services [phone]  Valbona Slabach R H. 12/16/2021, 9:29 AM

## 2021-12-16 NOTE — NC FL2 (Signed)
[place] LEVEL OF CARE SCREENING TOOL     IDENTIFICATION  Patient Name: Adam Richards Birthdate: 10-13-1926 Sex: male Admission Date (Current Location): 12/13/2021  [facility] and [place] Number:      Facility and Address:  [facility],  [place] [address], [place]      Provider Number:    Attending Physician Name and Address:  Aline August, MD  Relative Name and Phone Number:   Margreta Journey [phone]    Current Level of Care: Hospital Recommended Level of Care: [place] Prior Approval Number:    Date Approved/Denied:   PASRR Number:  [id number])  Discharge Plan: SNF    Current Diagnoses: Patient Active Problem List   Diagnosis Date Noted   Thrombocytopenia ([place]) 12/16/2021   Anemia of chronic disease 12/16/2021   Macrocytosis 12/16/2021   Status post left hip hemiarthroplasty 12/15/2021   Tibial plateau fracture, left 12/14/2021   S/p left hip fracture 12/13/2021   BPH (benign prostatic hyperplasia) 12/13/2021   Chronic anticoagulation 12/13/2021   Dyslipidemia 08/17/2021   Leg swelling 02/06/2020   Pulmonary HTN ([place]) 02/06/2020   Essential hypertension 06/06/2019   Educated about COVID-19 virus infection 06/06/2019   Subtherapeutic international normalized ratio (INR) 05/15/2019   Hyponatremia 05/15/2019   Lumbar pain 05/15/2019   Back pain 05/14/2019   Atrial fibrillation, chronic ([place]) 06/17/2015    Orientation RESPIRATION BLADDER Height & Weight     Self, Time, Situation, Place  Normal Continent Weight: 84.5 kg Height:  '5\' 10"'$  (177.8 cm)  BEHAVIORAL SYMPTOMS/MOOD NEUROLOGICAL BOWEL NUTRITION STATUS      Continent Diet (Regular)  AMBULATORY STATUS COMMUNICATION OF NEEDS Skin   Limited Assist Verbally Normal                       Personal Care Assistance Level of Assistance  Bathing, Feeding, Dressing Bathing Assistance: Limited assistance Feeding assistance: Limited  assistance Dressing Assistance: Limited assistance     Functional Limitations Info  Sight, Hearing, Speech Sight Info: Impaired (eyeglasses) Hearing Info: Impaired (Bilateral hearing aids) Speech Info: Adequate    SPECIAL CARE FACTORS FREQUENCY                       Contractures Contractures Info: Not present    Additional Factors Info                  Current Medications (12/16/2021):  This is the current hospital active medication list Current Facility-Administered Medications  Medication Dose Route Frequency Provider Last Rate Last Admin   acetaminophen (TYLENOL) tablet 325-650 mg  325-650 mg Oral Q6H PRN Irving Copas, PA-C       bisacodyl (DULCOLAX) suppository 10 mg  10 mg Rectal Daily PRN Irving Copas, PA-C       diphenhydrAMINE (BENADRYL) 12.5 MG/5ML elixir 12.5-25 mg  12.5-25 mg Oral Q4H PRN Irving Copas, PA-C       docusate sodium (COLACE) capsule 100 mg  100 mg Oral BID Irving Copas, PA-C   100 mg at 12/16/21 0626   dutasteride (AVODART) capsule 0.5 mg  0.5 mg Oral Daily Irving Copas, PA-C   0.5 mg at 12/16/21 0915   feeding supplement (ENSURE ENLIVE / ENSURE PLUS) liquid 237 mL  237 mL Oral BID BM Irving Copas, PA-C   237 mL at 12/16/21 1410   ferrous sulfate tablet 325 mg  325 mg Oral TID PC Donovan,  Nicola Girt, PA-C   325 mg at 12/16/21 1408   HYDROcodone-acetaminophen (NORCO/VICODIN) 5-325 MG per tablet 1-2 tablet  1-2 tablet Oral Q4H PRN Irving Copas, PA-C   1 tablet at 12/16/21 1937   menthol-cetylpyridinium (CEPACOL) lozenge 3 mg  1 lozenge Oral PRN Irving Copas, PA-C       Or   phenol (CHLORASEPTIC) mouth spray 1 spray  1 spray Mouth/Throat PRN Irving Copas, PA-C       methocarbamol (ROBAXIN) tablet 500 mg  500 mg Oral Q6H PRN Irving Copas, PA-C       Or   methocarbamol (ROBAXIN) 500 mg in dextrose 5 % 50 mL IVPB  500 mg Intravenous Q6H PRN Irving Copas, PA-C   Stopped at 12/15/21 2155    metoCLOPramide (REGLAN) tablet 5-10 mg  5-10 mg Oral Q8H PRN Irving Copas, PA-C       Or   metoCLOPramide (REGLAN) injection 5-10 mg  5-10 mg Intravenous Q8H PRN Irving Copas, PA-C       metoprolol tartrate (LOPRESSOR) tablet 37.5 mg  37.5 mg Oral BID Aline August, MD       morphine (PF) 2 MG/ML injection 0.5-1 mg  0.5-1 mg Intravenous Q2H PRN Irving Copas, PA-C       multivitamin with minerals tablet 1 tablet  1 tablet Oral Daily Irving Copas, PA-C   1 tablet at 12/16/21 0916   ondansetron (ZOFRAN) tablet 4 mg  4 mg Oral Q6H PRN Irving Copas, PA-C       Or   ondansetron [facility]) injection 4 mg  4 mg Intravenous Q6H PRN Irving Copas, PA-C       Oral care mouth rinse  15 mL Mouth Rinse PRN Costella Hatcher R, PA-C       polyethylene glycol (MIRALAX / GLYCOLAX) packet 17 g  17 g Oral Daily PRN Irving Copas, PA-C       senna (SENOKOT) tablet 8.6 mg  1 tablet Oral BID Irving Copas, PA-C   8.6 mg at 12/16/21 0916   simethicone (MYLICON) chewable tablet 80 mg  80 mg Oral QID PRN Irving Copas, PA-C   80 mg at 12/15/21 1448   tamsulosin (FLOMAX) capsule 0.4 mg  0.4 mg Oral Daily Irving Copas, PA-C   0.4 mg at 12/16/21 9024   Warfarin - Pharmacist Dosing Inpatient   Does not apply q1600 Luiz Ochoa, [facility]         Discharge Medications: Please see discharge summary for a list of discharge medications.  Relevant Imaging Results:  Relevant Lab Results:   Additional Information  (ss#[phone];COVID vaccinated x2;Booster x2)  Dessa Phi, RN

## 2021-12-17 DIAGNOSIS — E871 Hypo-osmolality and hyponatremia: Secondary | ICD-10-CM

## 2021-12-17 DIAGNOSIS — D696 Thrombocytopenia, unspecified: Secondary | ICD-10-CM

## 2021-12-17 DIAGNOSIS — D638 Anemia in other chronic diseases classified elsewhere: Secondary | ICD-10-CM | POA: Diagnosis not present

## 2021-12-17 DIAGNOSIS — N400 Enlarged prostate without lower urinary tract symptoms: Secondary | ICD-10-CM | POA: Diagnosis not present

## 2021-12-17 DIAGNOSIS — I482 Chronic atrial fibrillation, unspecified: Secondary | ICD-10-CM | POA: Diagnosis not present

## 2021-12-17 DIAGNOSIS — Z8781 Personal history of (healed) traumatic fracture: Secondary | ICD-10-CM | POA: Diagnosis not present

## 2021-12-17 LAB — PROTIME-INR
INR: 2.1 — ABNORMAL HIGH (ref 0.8–1.2)
Prothrombin Time: 23.8 seconds — ABNORMAL HIGH (ref 11.4–15.2)

## 2021-12-17 LAB — CBC
HCT: 30.2 % — ABNORMAL LOW (ref 39.0–52.0)
Hemoglobin: 10.1 g/dL — ABNORMAL LOW (ref 13.0–17.0)
MCH: 33.9 pg (ref 26.0–34.0)
MCHC: 33.4 g/dL (ref 30.0–36.0)
MCV: 101.3 fL — ABNORMAL HIGH (ref 80.0–100.0)
Platelets: 117 K/uL — ABNORMAL LOW (ref 150–400)
RBC: 2.98 MIL/uL — ABNORMAL LOW (ref 4.22–5.81)
RDW: 13.9 % (ref 11.5–15.5)
WBC: 13.9 K/uL — ABNORMAL HIGH (ref 4.0–10.5)
nRBC: 0 % (ref 0.0–0.2)

## 2021-12-17 LAB — BASIC METABOLIC PANEL WITH GFR
Anion gap: 6 (ref 5–15)
BUN: 25 mg/dL — ABNORMAL HIGH (ref 8–23)
CO2: 25 mmol/L (ref 22–32)
Calcium: 8.9 mg/dL (ref 8.9–10.3)
Chloride: 101 mmol/L (ref 98–111)
Creatinine, Ser: 0.78 mg/dL (ref 0.61–1.24)
GFR, Estimated: >60 mL/min
Glucose, Bld: 124 mg/dL — ABNORMAL HIGH (ref 70–99)
Potassium: 4.5 mmol/L (ref 3.5–5.1)
Sodium: 132 mmol/L — ABNORMAL LOW (ref 135–145)

## 2021-12-17 LAB — MAGNESIUM: Magnesium: 2 mg/dL (ref 1.7–2.4)

## 2021-12-17 MED ORDER — POLYETHYLENE GLYCOL 3350 17 G PO PACK
17.0000 g | PACK | Freq: Every day | ORAL | 0 refills | Status: AC | PRN
Start: 2021-12-17 — End: ?

## 2021-12-17 MED ORDER — WARFARIN SODIUM 1 MG PO TABS
1.0000 mg | ORAL_TABLET | Freq: Once | ORAL | Status: AC
  Administered 2021-12-17: 1 mg via ORAL
  Filled 2021-12-17: qty 1

## 2021-12-17 MED ORDER — FERROUS SULFATE 325 (65 FE) MG PO TABS: 325.0000 mg | ORAL_TABLET | Freq: Three times a day (TID) | ORAL | 0 refills | Status: AC

## 2021-12-17 MED ORDER — METHOCARBAMOL 500 MG PO TABS: 500.0000 mg | ORAL_TABLET | Freq: Four times a day (QID) | ORAL | 0 refills | Status: AC | PRN

## 2021-12-17 MED ORDER — HYDROCODONE-ACETAMINOPHEN 5-325 MG PO TABS: 1.0000 | ORAL_TABLET | Freq: Four times a day (QID) | ORAL | 0 refills | Status: AC | PRN

## 2021-12-17 NOTE — Progress Notes (Signed)
ANTICOAGULATION CONSULT NOTE   Pharmacy Consult for Warfarin Indication: history of atrial fibrillation, DVT prophylaxis post-operatively  No Known Allergies  Patient Measurements: Height: '5\' 10"'$  (177.8 cm) Weight: 84.5 kg (186 lb 4.6 oz) IBW/kg (Calculated) : 73  Vital Signs: Temp: 97.5 F (36.4 C) (07/27 0605) Temp Source: Oral (07/27 0605) BP: 142/89 (07/27 0605) Pulse Rate: 81 (07/27 0605)  Labs: Recent Labs    12/15/21 0436 12/16/21 0448 12/17/21 0515  HGB  --  10.3* 10.1*  HCT  --  30.9* 30.2*  PLT  --  107* 117*  LABPROT 18.1* 16.7* 23.8*  INR 1.5* 1.4* 2.1*  CREATININE  --  0.79 0.78     Estimated Creatinine Clearance: 60.8 mL/min (by C-G formula based on SCr of 0.78 mg/dL).   Medications:  PTA warfarin regimen: 2.'5mg'$  daily   Assessment: Patient is a 88 y.o M with history of afib on warfarin PTA who presented to the ED on 12/13/21 for evaluation s/p fall.  He was found to have left femoral neck fracture. Warfarin resumed on admission (given one dose of 2.'5mg'$  per MD on 12/13/21), but then held for repair of fractured hip and left knee joint aspiration on 12/15/21. Vitamin K '2mg'$  IV x1 given on 12/14/21 for reversal of INR. Pharmacy consulted on 12/15/21 to resume warfarin post-op. Pharmacist spoke to Costella Hatcher, PA-C from Ortho team, who said to start warfarin on 7/26 in the AM.  Today, 12/17/2021: INR 2.1 now therapeutic and only 1 dose of warfarin has been given 7/26 '4mg'$  CBC: Hgb stable, Pltc 117 up slightly No major drug interactions noted Regular diet ordered No reported bleeding  Goal of Therapy:  INR 2-3 Monitor platelets by anticoagulation protocol: Yes   Plan:  Warfarin '1mg'$  today Daily PT/INR Monitor CBC and for s/sx of bleeding   Adrian Saran, PharmD, BCPS Secure Chat if ?s 12/17/2021 8:30 AM

## 2021-12-17 NOTE — Progress Notes (Signed)
Patients daughter is at bedside and requests that the pt be seen by PT prior to discharge. PT office contacted to assist.

## 2021-12-17 NOTE — Progress Notes (Signed)
PROGRESS NOTE    Adam Richards  HMC:[id number] DOB: [birth year] DOA: 12/13/2021 PCP: Wenda Low, MD   Brief Narrative:  88 y.o. male with medical history significant of atrial fibrillation on coumadin, hypertension, hyperlipidemia, and benign prostatic hypertrophy presented with a mechanical fall and was found to have left hip fracture and left tibia-fibula fracture.  He underwent surgical intervention of the left hip on 12/15/2021.  Assessment & Plan:   Left hip fracture status post mechanical fall -Status post surgical intervention of the left hip on 12/15/2021.  Wound care/activity/DVT prophylaxis/pain management as per orthopedics recommendations.  Coumadin resumed on 12/16/2021 per orthopedics recommendations. -Fall precautions -PT recommended SNF placement.  TOC consulted.  Left tibial plateau fracture with lipohemarthrosis -Status post left knee aspiration by orthopedics on 12/15/2021.  Orthopedics following.  Currently has Ace wrap to knee  Supratherapeutic INR -Received vitamin K on 12/14/2021.  INR 2.1 today.  Repeat a.m. INR.  Chronic A-fib -Coumadin to be resumed today as per orthopedics recommendations.  Dose of Coumadin as per pharmacy -Continue metoprolol.  Currently rate controlled mostly  Hyponatremia -Mild.  Encourage oral intake.  Monitor.  Leukocytosis -Mild.  Possibly reactive.  Monitor.  Thrombocytopenia -Questionable cause.  No signs of bleeding.  Monitor  Anemia of chronic disease Macrocytosis -Hemoglobin stable.  Monitor intermittently  Dyslipidemia -continue statin on discharge  Essential hypertension -blood pressure stable.  Continue metoprolol  BPH -Continue tamsulosin and Avodart   DVT prophylaxis: Coumadin Code Status: Full Family Communication: None at bedside Disposition Plan: Status is: Inpatient Remains inpatient appropriate because: Of severity of illness.  Need for SNF placement.    Consultants: Orthopedics  Procedures: Left  hip hemiarthroplasty and left knee aspiration on 12/15/2021 by orthopedics  Antimicrobials: Perioperative   Subjective: Patient seen and examined at bedside.  No chest pain, shortness of breath, vomiting or fever reported.  Complains of intermittent left hip and knee pain  Objective: Vitals:   12/16/21 2028 12/16/21 2108 12/16/21 2200 12/17/21 0605  BP: 121/74   (!) 142/89  Pulse: 88   81  Resp: '18 16 15 20  '$ Temp: 97.6 F (36.4 C)   (!) 97.5 F (36.4 C)  TempSrc: Oral   Oral  SpO2: 97%   97%  Weight:      Height:        Intake/Output Summary (Last 24 hours) at 12/17/2021 0742 Last data filed at 12/16/2021 1845 Gross per 24 hour  Intake 840 ml  Output 375 ml  Net 465 ml    Filed Weights   12/14/21 1000  Weight: 84.5 kg    Examination:  General: On room air.  No distress.  Elderly male lying in bed. respiratory: Decreased breath sounds at bases bilaterally with some crackles CVS: Currently rate controlled; S1-S2 heard  abdominal: Soft, nontender, slightly distended, no organomegaly; normal bowel sounds are heard  extremities: Trace lower extremity edema; no clubbing.  Left knee Ace wrap present.  Left hip dressing present.    Data Reviewed: I have personally reviewed following labs and imaging studies  CBC: Recent Labs  Lab 12/13/21 1550 12/14/21 0454 12/16/21 0448 12/17/21 0515  WBC 10.2 7.8 9.4 13.9*  NEUTROABS  --   --  7.2  --   HGB 12.0* 10.8* 10.3* 10.1*  HCT 36.1* 31.8* 30.9* 30.2*  MCV 102.0* 101.6* 102.3* 101.3*  PLT 156 128* 107* 117*    Basic Metabolic Panel: Recent Labs  Lab 12/13/21 1550 12/14/21 0454 12/16/21 0448 12/17/21 0515  NA 135 135  136 132*  K 3.7 4.3 4.4 4.5  CL 100 103 103 101  CO2 '27 26 25 25  '$ GLUCOSE 106* 122* 170* 124*  BUN '20 21 19 '$ 25*  CREATININE 0.81 0.86 0.79 0.78  CALCIUM 9.0 8.7* 8.6* 8.9  MG  --   --  2.1 2.0  PHOS  --   --  3.9  --     GFR: Estimated Creatinine Clearance: 60.8 mL/min (by C-G formula based  on SCr of 0.78 mg/dL). Liver Function Tests: Recent Labs  Lab 12/16/21 0448  AST 18  ALT 14  ALKPHOS 55  BILITOT 0.5  PROT 5.9*  ALBUMIN 3.0*    No results for input(s): "LIPASE", "AMYLASE" in the last 168 hours. No results for input(s): "AMMONIA" in the last 168 hours. Coagulation Profile: Recent Labs  Lab 12/14/21 0454 12/14/21 1804 12/15/21 0436 12/16/21 0448 12/17/21 0515  INR 3.1* 1.9* 1.5* 1.4* 2.1*    Cardiac Enzymes: No results for input(s): "CKTOTAL", "CKMB", "CKMBINDEX", "TROPONINI" in the last 168 hours. BNP (last 3 results) No results for input(s): "PROBNP" in the last 8760 hours. HbA1C: No results for input(s): "HGBA1C" in the last 72 hours. CBG: No results for input(s): "GLUCAP" in the last 168 hours. Lipid Profile: No results for input(s): "CHOL", "HDL", "LDLCALC", "TRIG", "CHOLHDL", "LDLDIRECT" in the last 72 hours. Thyroid Function Tests: No results for input(s): "TSH", "T4TOTAL", "FREET4", "T3FREE", "THYROIDAB" in the last 72 hours. Anemia Panel: No results for input(s): "VITAMINB12", "FOLATE", "FERRITIN", "TIBC", "IRON", "RETICCTPCT" in the last 72 hours. Sepsis Labs: No results for input(s): "PROCALCITON", "LATICACIDVEN" in the last 168 hours.  Recent Results (from the past 240 hour(s))  MRSA Next Gen by PCR, Nasal     Status: None   Collection Time: 12/15/21  9:03 AM   Specimen: Nasal Mucosa; Nasal Swab  Result Value Ref Range Status   MRSA by PCR Next Gen NOT DETECTED NOT DETECTED Final    Comment: (NOTE) The GeneXpert MRSA Assay (FDA approved for NASAL specimens only), is one component of a comprehensive MRSA colonization surveillance program. It is not intended to diagnose MRSA infection nor to guide or monitor treatment for MRSA infections. Test performance is not FDA approved in patients less than 31 years old. Performed at [facility], [place] [address], [place],  [address]          Radiology  Studies: DG Pelvis Portable  Result Date: 12/15/2021 CLINICAL DATA:  Postop total left hip arthroplasty. EXAM: PORTABLE PELVIS 1-2 VIEWS COMPARISON:  Left hip radiographs and CT pelvis 12/13/2021. FINDINGS: 2134 hours. The upper pelvis is excluded. Patient has undergone interval left hip unipolar hemiarthroplasty. The hardware is well positioned, and there is no evidence of acute fracture or dislocation. Mild right hip degenerative changes are noted. There is gas in the left hip joint and surrounding soft tissues. IMPRESSION: No demonstrated complication following left hip hemiarthroplasty. Electronically Signed   By: Richardean Sale M.D.   On: 12/15/2021 21:58        Scheduled Meds:  docusate sodium  100 mg Oral BID   dutasteride  0.5 mg Oral Daily   feeding supplement  237 mL Oral BID BM   ferrous sulfate  325 mg Oral TID PC   metoprolol tartrate  37.5 mg Oral BID   multivitamin with minerals  1 tablet Oral Daily   senna  1 tablet Oral BID   tamsulosin  0.4 mg Oral Daily   Warfarin - Pharmacist Dosing Inpatient  Does not apply q1600   Continuous Infusions:  methocarbamol (ROBAXIN) IV Stopped (12/15/21 2155)          Aline August, MD [facility] 12/17/2021, 7:42 AM

## 2021-12-17 NOTE — Progress Notes (Signed)
Physical Therapy Treatment Patient Details Name: Adam Richards MRN: [id number] DOB: April 15, 1927 Today's Date: 12/17/2021   History of Present Illness 88 y.o. male presenting to [facility] ED after a witnessed fall while ascending steps. Landed on L knee. Imaging (+) for L displaced femoral neck fx, acute nondisplaced L proximal tibial plateau fx s/p L hemiarthroplasty and L knee aspiration for lipohemarthrosis on 12/15/21. TDWB-PWB LLE 25-50%.    PT Comments    Pt assisted with performing sit to stands and requiring +2 assist for weakness and stability at this time.  Pt also requires elevated sitting surface to assist with transfers.  Pt pleasant and cooperative.  Pt educated on maintaining neutral positioning of L LE while resting in bed (found in increased internal rotation) and repositioned with pillows.  Continue to recommend SNF upon d/c.    Recommendations for follow up therapy are one component of a multi-disciplinary discharge planning process, led by the attending physician.  Recommendations may be updated based on patient status, additional functional criteria and insurance authorization.  Follow Up Recommendations  Skilled nursing-short term rehab (<3 hours/day) Can patient physically be transported by private vehicle: No   Assistance Recommended at Discharge Frequent or constant Supervision/Assistance  Patient can return home with the following Two people to help with walking and/or transfers;Two people to help with bathing/dressing/bathroom   Equipment Recommendations  None recommended by PT    Recommendations for Other Services       Precautions / Restrictions Precautions Precautions: Fall Precaution Comments: no hip precautions (per Caryl Pina, [place]) Restrictions Weight Bearing Restrictions: Yes LLE Weight Bearing: Touchdown weight bearing LLE Partial Weight Bearing Percentage or Pounds: 25-50% per ortho note     Mobility  Bed Mobility Overal bed mobility: Needs Assistance Bed  Mobility: Sit to Supine     Supine to sit: Max assist, +2 for physical assistance Sit to supine: +2 for physical assistance, Mod assist   General bed mobility comments: pt attempting to self assist upper body with rails, requiring assist for lower body    Transfers Overall transfer level: Needs assistance Equipment used: Rolling walker (2 wheels) Transfers: Sit to/from Stand Sit to Stand: Mod assist, +2 physical assistance, From elevated surface Stand pivot transfers: Mod assist, +2 physical assistance         General transfer comment: verbal cues for UE and LE positioning to maintain TDWB (pt recalls "eggshell"), utilized elevated bed surface to assist pt, pt performed x2 for strengthening and technique; also performed scooting R foot over to go up [facility] requiring step by step cues and use of upper body to maintain precautions; pt also attempted "hop" forward with R LE however this was not smooth and worrisome for fatigue and keeping weight bearing precaution so assisted with safely returning to bed    Ambulation/Gait                   Stairs             Wheelchair Mobility    Modified Rankin (Stroke Patients Only)       Balance                                            Cognition Arousal/Alertness: Awake/alert Behavior During Therapy: WFL for tasks assessed/performed Overall Cognitive Status: Within Functional Limits for tasks assessed  Exercises      General Comments        Pertinent Vitals/Pain Pain Assessment Pain Assessment: Faces Faces Pain Scale: Hurts little more Pain Location: L knee Pain Descriptors / Indicators: Grimacing, Discomfort, Sore Pain Intervention(s): Monitored during session, Repositioned, Patient requesting pain meds-RN notified    Home Living                          Prior Function            PT Goals (current goals can now be  found in the care plan section) Progress towards PT goals: Progressing toward goals    Frequency    Min 3X/week      PT Plan Current plan remains appropriate    Co-evaluation              AM-PAC PT "6 Clicks" Mobility   Outcome Measure  Help needed turning from your back to your side while in a flat bed without using bedrails?: A Lot Help needed moving from lying on your back to sitting on the side of a flat bed without using bedrails?: A Lot Help needed moving to and from a bed to a chair (including a wheelchair)?: Total Help needed standing up from a chair using your arms (e.g., wheelchair or bedside chair)?: Total Help needed to walk in hospital room?: Total Help needed climbing 3-5 steps with a railing? : Total 6 Click Score: 8    End of Session Equipment Utilized During Treatment: Gait belt Activity Tolerance: Patient tolerated treatment well;Patient limited by fatigue Patient left: in bed;with call bell/phone within reach Nurse Communication: Patient requests pain meds;Mobility status PT Visit Diagnosis: Difficulty in walking, not elsewhere classified (R26.2)     Time: 2952-8413 PT Time Calculation (min) (ACUTE ONLY): 30 min  Charges:  $Therapeutic Activity: 23-37 mins                     Jannette Spanner PT, DPT Physical Therapist Acute Rehabilitation Services Preferred contact method: Secure Chat Weekend Pager Only: [phone] Office: [place] 12/17/2021, 4:25 PM

## 2021-12-17 NOTE — Anesthesia Postprocedure Evaluation (Signed)
Anesthesia Post Note  Patient: Adam Richards  Procedure(s) Performed: ARTHROPLASTY BIPOLAR HIP (HEMIARTHROPLASTY) (Left: Hip) LEFT KNEE ASPIRATION FOR LIPOHEMARTHROSIS (Left: Knee)     Patient location during evaluation: PACU Anesthesia Type: General Level of consciousness: awake and alert Pain management: pain level controlled Vital Signs Assessment: post-procedure vital signs reviewed and stable Respiratory status: spontaneous breathing, nonlabored ventilation, respiratory function stable and patient connected to nasal cannula oxygen Cardiovascular status: blood pressure returned to baseline and stable Postop Assessment: no apparent nausea or vomiting Anesthetic complications: yes   Encounter Notable Events  Notable Event Outcome Phase Comment  Difficult to intubate - expected  Intraprocedure Filed from anesthesia note documentation.    Last Vitals:  Vitals:   12/16/21 2200 12/17/21 0605  BP:  (!) 142/89  Pulse:  81  Resp: 15 20  Temp:  (!) 36.4 C  SpO2:  97%    Last Pain:  Vitals:   12/17/21 0605  TempSrc: Oral  PainSc:                  [place] S

## 2021-12-17 NOTE — TOC Progression Note (Addendum)
Transition of Care [facility]) - Progression Note    Patient Details  Name: Adam Richards MRN: [id number] Date of Birth: Mar 11, 1927  Transition of Care [facility]) CM/SW Contact  Layan Zalenski, Juliann Pulse, RN Phone Number: 12/17/2021, 10:15 AM  Clinical Narrative: Bed offers.given-await choice.left vm w/dtr Margreta Journey. 3p-spoke to dtr Christina in rm w/patient-dtr has list will choose facility & get back to me on choice.IfClapps PG chosen contact Willette Parker main tel#[phone].    1. 1.2 mi [facility] [address] [place], [place] [address] [phone] Overall rating Much below average 2. 1.5 mi [place] at the [place], [place] [address] [phone] Overall rating Above average 3. [place], [place] [address] [phone] Overall rating Above average 4. 2.3 mi [place], [place] [address] [phone] Overall rating Much below average 5. 2.7 [place] [address] [place], [place] [address] [phone] Overall rating Much below average 6. 3.1 mi [place] [facility] [place], [place] [address] [phone] Overall rating Above average 7. 3.1 mi [facility] and [place], [place] [address] [phone] Overall rating Much below average 8. 3.7 mi [facility] [address] [place], [place] [address] [phone] Overall rating Much below average 9. 3.8 mi [facility] [place], [place] [address] [phone] Overall rating Much below average 10. 5.4 [place], [place] [address] [phone] Overall  rating Average 11. 5.7 mi [facility] at [place], [place] [address] [phone] Overall rating Much above average 12. 7.2 mi [place], [place] [address] [phone] Overall rating Above average 13. 7.3 mi [facility] and [place]ina [place], [place] [address] [phone] Overall rating Above average 14. 8.2 [place], [place] [address] [phone] Overall rating Much above average 15. 10.6 mi The [place] [address] [place], [place] [address] [phone] Overall rating Above average 16. 10.9 mi [facility] [address] [place], [place] [address] [phone] Overall rating Much above average 17. 12.5 mi [place] at [facility] [address] [place], [place] [address] [phone] Overall rating Much above average 18. 14.1 mi [facility] [address] [place], [place] [address] [phone] Overall rating Much below average 19. 14.5 [facility] [address] [place], [place] [address] [phone] Overall rating Much below average 20. 15 mi [facility] [place], [place] [address] [phone] Overall rating Above average 21. 15.6 mi The [place] [address] [place], [place] [address] [phone] Overall rating Below average 22. 16.1 mi [facility] [place], [place] [address] [phone] Overall rating Much above average 23. 16.2 [place] and [facility] [place], [place] [address] [phone] Overall rating Much above average 24. 16.2 mi [place], [place] [address] [phone] Overall rating Average 25. 16.3 [place]  [place], [place] [address] [phone] Overall rating Much below average 26. 16.9 mi [place] [address] [place], [place] [address] [phone] Overall rating Average 27. 18.3 mi [address] at [employer] at [place], [place] [address] [phone] Overall rating Much above average 28. 19.7 mi [facility] [address] [place], [place] [address] [phone] Overall rating Much below average 29. 20.4 mi [facility] [address] [place], [place] [address] [phone] Overall rating Much below average 30. 20.6 [place], [place] [address] [phone] Overall rating Much above average 31. 20.9 mi [facility] [address], [place] [address] [phone] Overall rating Below average 32. 20.9 mi [place], [place] [address] [phone] Overall rating Much below average 33. 21.4 mi [facility] [address] [place], [place] [address] [phone] Overall rating Much below average 34. 21.7 mi [facility] [address] [place], [place] [address] [phone] Overall rating Above average 35. 21.8 [place], [place] [address] [phone] Overall rating Not available18 36. 23.2 mi [address] [place], [place] [address] [phone] Overall rating Below average 37. 23.3 mi [facility] [place], [place] [address] [phone] Overall rating Much below average 38. 23.6 [address] [place], [place] [address] [phone] Overall  rating Much above average 39. 24.3 [facility] Care[facility] [address] [place], [place] [address] [phone] Overall rating Much below average 40. 24.7 mi [place] [facility] [place],  [address] [phone] Overall rating Average To explore and download nursing home data,visit the data c  Expected Discharge Plan: [place] Barriers to Discharge: Continued Medical Work up  Expected Discharge Plan and Services Expected Discharge Plan: [place]   Discharge Planning Services: CM Consult   Living arrangements for the past 2 months: [place]                                       Social Determinants of Health (SDOH) Interventions    Readmission Risk Interventions     No data to display

## 2021-12-17 NOTE — Discharge Summary (Addendum)
Physician Discharge Summary  Adam Richards IHK:[id number] DOB: 1927-01-05 DOA: 12/13/2021  PCP: Wenda Low, MD  Admit date: 12/13/2021 Discharge date: 12/18/2021  Admitted From: Home Disposition: SNF  Recommendations for Outpatient Follow-up:  Follow up with PCP in 1 week with repeat CBC/BMP Outpatient follow-up with orthopedics.   Wound care/activity/DVT prophylaxis/pain management as per orthopedics recommendations. Follow up in ED if symptoms worsen or new appear  Addendum on 12/18/2021: Patient is waiting to be discharged to SNF.  Currently medically stable for discharge.    Home Health: No Equipment/Devices: None  Discharge Condition: Stable CODE STATUS: Full Diet recommendation: Heart healthy  Brief/Interim Summary: 88 y.o. male with medical history significant of atrial fibrillation on coumadin, hypertension, hyperlipidemia, and benign prostatic hypertrophy presented with a mechanical fall and was found to have left hip fracture and left tibia-fibula fracture.  He underwent surgical intervention of the left hip on 12/15/2021. PT recommended SNF placement.  He will be discharged to SNF once bed is available.  Discharge Diagnoses:     Left hip fracture status post mechanical fall -Status post surgical intervention of the left hip on 12/15/2021.  Wound care/activity/DVT prophylaxis/pain management as per orthopedics recommendations.  Coumadin resumed on 12/16/2021 per orthopedics recommendations. -Fall precautions -PT recommended SNF placement.  TOC consulted.   Left tibial plateau fracture with lipohemarthrosis -Status post left knee aspiration by orthopedics on 12/15/2021.  Orthopedics following.  Currently has Ace wrap to knee   Supratherapeutic INR -Received vitamin K on 12/14/2021.  INR 2.1 today.  Outpatient follow-up with  Chronic A-fib -Coumadin resumed on 12/16/2021 as per orthopedics recommendations.  Continue Coumadin on discharge.  -Continue metoprolol.  Currently  rate controlled mostly   Hyponatremia -Mild.  Encourage oral intake.  Outpatient follow-up.  Leukocytosis -Mild.  Possibly reactive.  Outpatient follow-up.  Thrombocytopenia -Questionable cause.  No signs of bleeding.  Monitor  Anemia of chronic disease Macrocytosis -Hemoglobin stable.  Monitor intermittently   Dyslipidemia -continue statin on discharge  Essential hypertension -blood pressure stable.  Continue metoprolol  BPH -Continue tamsulosin and Avodart     Discharge Instructions  Discharge Instructions     Diet - low sodium heart healthy   Complete by: As directed    Discharge wound care:   Complete by: As directed    As per orthopedics recommendations   Increase activity slowly   Complete by: As directed       Allergies as of 12/17/2021   No Known Allergies      Medication List     STOP taking these medications    amLODipine 2.5 MG tablet Commonly known as: NORVASC       TAKE these medications    docusate calcium 240 MG capsule Commonly known as: SURFAK Take 240 mg by mouth daily.   dutasteride 0.5 MG capsule Commonly known as: AVODART Take 1 capsule by mouth daily.   ferrous sulfate 325 (65 FE) MG tablet Take 1 tablet (325 mg total) by mouth 3 (three) times daily after meals.   furosemide 20 MG tablet Commonly known as: LASIX Take 20 mg by mouth 2 (two) times daily.   lovastatin 20 MG tablet Commonly known as: MEVACOR Take 20 mg by mouth daily.   methocarbamol 500 MG tablet Commonly known as: ROBAXIN Take 1 tablet (500 mg total) by mouth every 6 (six) hours as needed for muscle spasms.   metoprolol tartrate 25 MG tablet Commonly known as: LOPRESSOR Take 25 mg by mouth 2 (two) times daily. What changed: Another  medication with the same name was removed. Continue taking this medication, and follow the directions you see here.   polyethylene glycol 17 g packet Commonly known as: MIRALAX / GLYCOLAX Take 17 g by mouth daily as  needed for mild constipation.   potassium chloride 10 MEQ tablet Commonly known as: KLOR-CON Take 10 mEq by mouth daily.   tamsulosin 0.4 MG Caps capsule Commonly known as: FLOMAX Take 1 capsule by mouth daily.   warfarin 2.5 MG tablet Commonly known as: COUMADIN SMARTSIG:1 Tablet(s) By Mouth Every Evening               Discharge Care Instructions  (From admission, onward)           Start     Ordered   12/17/21 0000  Discharge wound care:       Comments: As per orthopedics recommendations   12/17/21 1339            Follow-up Information     Paralee Cancel, MD. Schedule an appointment as soon as possible for a visit in 2 week(s).   Specialty: Orthopedic Surgery Contact information: [address] [place] [address] [phone]                No Known Allergies  Consultations: Orthopedics   Procedures/Studies: DG Pelvis Portable  Result Date: 12/15/2021 CLINICAL DATA:  Postop total left hip arthroplasty. EXAM: PORTABLE PELVIS 1-2 VIEWS COMPARISON:  Left hip radiographs and CT pelvis 12/13/2021. FINDINGS: 2134 hours. The upper pelvis is excluded. Patient has undergone interval left hip unipolar hemiarthroplasty. The hardware is well positioned, and there is no evidence of acute fracture or dislocation. Mild right hip degenerative changes are noted. There is gas in the left hip joint and surrounding soft tissues. IMPRESSION: No demonstrated complication following left hip hemiarthroplasty. Electronically Signed   By: Richardean Sale M.D.   On: 12/15/2021 21:58   CT KNEE LEFT WO CONTRAST  Result Date: 12/14/2021 CLINICAL DATA:  Status post fall yesterday, left knee pain EXAM: CT OF THE LEFT KNEE WITHOUT CONTRAST TECHNIQUE: Multidetector CT imaging of the left knee was performed according to the standard protocol. Multiplanar CT image reconstructions were also generated. RADIATION DOSE REDUCTION: This exam was performed according to the  departmental dose-optimization program which includes automated exposure control, adjustment of the mA and/or kV according to patient size and/or use of iterative reconstruction technique. COMPARISON:  12/13/2021 FINDINGS: Bones/Joint/Cartilage Severe osteopenia. Nondisplaced fracture of the medial tibial plateau involving the articular surface with 1 mm of depression of the central articular surface. No other fracture or dislocation. Large lipohemarthrosis. Mild osteoarthritis of the patellofemoral compartment. 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 Small loose bodies in the posterior joint space and along the anterior margin of the medial tibia. Ligaments Ligaments are suboptimally evaluated by CT. Muscles and Tendons Muscles are normal. No muscle atrophy. No intramuscular fluid collection or hematoma. Quadriceps tendon and patellar tendon are intact. Soft tissue No fluid collection or hematoma. No soft tissue mass. Peripheral vascular atherosclerotic disease. IMPRESSION: 1. Acute nondisplaced fracture of the medial tibial plateau involving the articular surface with 1 mm of depression of the central articular surface. 2. Large lipohemarthrosis. Electronically Signed   By: Kathreen Devoid M.D.   On: 12/14/2021 12:43   CT PELVIS WO CONTRAST  Result Date: 12/13/2021 CLINICAL DATA:  Fall.  Clinical suspicion for fracture. EXAM: CT PELVIS WITHOUT CONTRAST TECHNIQUE: Multidetector CT imaging of the pelvis was  performed following the standard protocol without intravenous contrast. RADIATION DOSE REDUCTION: This exam was performed according to the departmental dose-optimization program which includes automated exposure control, adjustment of the mA and/or kV according to patient size and/or use of iterative reconstruction technique. COMPARISON:  Current hip radiographs. Prior CT a chest, abdomen and pelvis from 05/14/2019. FINDINGS: Musculoskeletal: Acute fracture of the left  femoral neck. Fracture extends obliquely across the neck, predominantly mid cervical, without significant displacement and without comminution. Mild apex anterior angulation. There is no significant varus or valgus angulation. No other fractures. Skeletal structures are diffusely demineralized. No bone lesion. Mild concentric right hip joint space narrowing. Left hip joint is normally spaced and aligned. Mild degenerative narrowing of the SI joints. Pubic symphysis normally spaced and aligned. There are degenerative changes of the visualized lumbar spine. Urinary Tract: Lobulated, low-attenuation left renal mass with associated septa and wall calcifications, 5.5 cm in size, without change from the CT from 2020. No acute renal abnormalities. Ureters are unremarkable. Bladder mostly decompressed. Bowel:  No bowel dilation or inflammation. Vascular/Lymphatic: Aorto iliac atherosclerosis. No aneurysm. No enlarged lymph nodes. Reproductive:  Unremarkable. Other:  None. IMPRESSION: 1. Left femoral neck fracture, nondisplaced and non comminuted, with mild apex anterior angulation but no significant varus or valgus angulation. Electronically Signed   By: Lajean Manes M.D.   On: 12/13/2021 14:13   DG Hip Unilat W or Wo Pelvis 2-3 Views Left  Result Date: 12/13/2021 CLINICAL DATA:  Fall with left hip pain. EXAM: DG HIP (WITH OR WITHOUT PELVIS) 2-3V LEFT COMPARISON:  None Available. FINDINGS: Bones are diffusely demineralized. SI joints and symphysis pubis unremarkable. No evidence for pubic ramus fracture. Although fracture line is not well demonstrated, there does appear to be a subcapsular transcervical left femoral neck fracture with mild varus angulation. IMPRESSION: Imaging features compatible with sub capsule or transcervical femoral neck fracture although assessment limited by bony demineralization. CT imaging could be used to further characterize as clinically warranted. Electronically Signed   By: Misty Stanley  M.D.   On: 12/13/2021 13:31   DG Knee Complete 4 Views Left  Result Date: 12/13/2021 CLINICAL DATA:  Injury from a fall. Anterior left knee pain. Fell onto left knee walking upstairs. EXAM: LEFT KNEE - COMPLETE 4+ VIEW COMPARISON:  None Available. FINDINGS: There is diffuse decreased bone mineralization. Mild medial compartment joint space narrowing. Mild mediolateral compartment chondrocalcinosis. Mild peripheral medial compartment degenerative osteophytosis. Small inferior greater than superior patellar degenerative osteophytes. No joint effusion. Apparent well corticated likely chronic ossicle at the anterior aspect of the proximal tibia on lateral view. No definite acute fracture is seen. No dislocation. Moderate vascular calcifications. IMPRESSION: Within the limitations of diffuse decreased bone mineralization, no acute fracture is seen. Electronically Signed   By: Yvonne Kendall M.D.   On: 12/13/2021 13:06      Subjective: Patient seen and examined at bedside.  No chest pain, shortness of breath, vomiting or fever reported.  Complains of intermittent left hip and knee pain   Discharge Exam: Vitals:   12/17/21 1006 12/17/21 1308  BP: (!) 144/81 (!) 137/91  Pulse: 78 92  Resp: 18 18  Temp:  97.8 F (36.6 C)  SpO2: 97% 98%    General: On room air.  No distress.  Elderly male lying in bed. respiratory: Decreased breath sounds at bases bilaterally with some crackles CVS: Currently rate controlled; S1-S2 heard  abdominal: Soft, nontender, slightly distended, no organomegaly; normal bowel sounds are heard  extremities:  Trace lower extremity edema; no clubbing.  Left knee Ace wrap present.  Left hip dressing present.    The results of significant diagnostics from this hospitalization (including imaging, microbiology, ancillary and laboratory) are listed below for reference.     Microbiology: Recent Results (from the past 240 hour(s))  MRSA Next Gen by PCR, Nasal     Status: None    Collection Time: 12/15/21  9:03 AM   Specimen: Nasal Mucosa; Nasal Swab  Result Value Ref Range Status   MRSA by PCR Next Gen NOT DETECTED NOT DETECTED Final    Comment: (NOTE) The GeneXpert MRSA Assay (FDA approved for NASAL specimens only), is one component of a comprehensive MRSA colonization surveillance program. It is not intended to diagnose MRSA infection nor to guide or monitor treatment for MRSA infections. Test performance is not FDA approved in patients less than 48 years old. Performed at [facility], [place] [address], [place], [place] [address]      Labs: BNP (last 3 results) No results for input(s): "BNP" in the last 8760 hours. Basic Metabolic Panel: Recent Labs  Lab 12/13/21 1550 12/14/21 0454 12/16/21 0448 12/17/21 0515  NA 135 135 136 132*  K 3.7 4.3 4.4 4.5  CL 100 103 103 101  CO2 '27 26 25 25  '$ GLUCOSE 106* 122* 170* 124*  BUN '20 21 19 '$ 25*  CREATININE 0.81 0.86 0.79 0.78  CALCIUM 9.0 8.7* 8.6* 8.9  MG  --   --  2.1 2.0  PHOS  --   --  3.9  --    Liver Function Tests: Recent Labs  Lab 12/16/21 0448  AST 18  ALT 14  ALKPHOS 55  BILITOT 0.5  PROT 5.9*  ALBUMIN 3.0*   No results for input(s): "LIPASE", "AMYLASE" in the last 168 hours. No results for input(s): "AMMONIA" in the last 168 hours. CBC: Recent Labs  Lab 12/13/21 1550 12/14/21 0454 12/16/21 0448 12/17/21 0515  WBC 10.2 7.8 9.4 13.9*  NEUTROABS  --   --  7.2  --   HGB 12.0* 10.8* 10.3* 10.1*  HCT 36.1* 31.8* 30.9* 30.2*  MCV 102.0* 101.6* 102.3* 101.3*  PLT 156 128* 107* 117*   Cardiac Enzymes: No results for input(s): "CKTOTAL", "CKMB", "CKMBINDEX", "TROPONINI" in the last 168 hours. BNP: Invalid input(s): "POCBNP" CBG: No results for input(s): "GLUCAP" in the last 168 hours. D-Dimer No results for input(s): "DDIMER" in the last 72 hours. Hgb A1c No results for input(s): "HGBA1C" in the last 72 hours. Lipid Profile No results for input(s): "CHOL",  "HDL", "LDLCALC", "TRIG", "CHOLHDL", "LDLDIRECT" in the last 72 hours. Thyroid function studies No results for input(s): "TSH", "T4TOTAL", "T3FREE", "THYROIDAB" in the last 72 hours.  Invalid input(s): "FREET3" Anemia work up No results for input(s): "VITAMINB12", "FOLATE", "FERRITIN", "TIBC", "IRON", "RETICCTPCT" in the last 72 hours. Urinalysis    Component Value Date/Time   COLORURINE YELLOW 10/18/2020 1003   APPEARANCEUR CLEAR 10/18/2020 1003   LABSPEC 1.013 10/18/2020 1003   PHURINE 7.0 10/18/2020 1003   GLUCOSEU NEGATIVE 10/18/2020 1003   HGBUR NEGATIVE 10/18/2020 1003   [place] 10/18/2020 1003   [place] 10/18/2020 1003   PROTEINUR NEGATIVE 10/18/2020 1003   NITRITE NEGATIVE 10/18/2020 1003   LEUKOCYTESUR NEGATIVE 10/18/2020 1003   Sepsis Labs Recent Labs  Lab 12/13/21 1550 12/14/21 0454 12/16/21 0448 12/17/21 0515  WBC 10.2 7.8 9.4 13.9*   Microbiology Recent Results (from the past 240 hour(s))  MRSA Next Gen by PCR,  Nasal     Status: None   Collection Time: 12/15/21  9:03 AM   Specimen: Nasal Mucosa; Nasal Swab  Result Value Ref Range Status   MRSA by PCR Next Gen NOT DETECTED NOT DETECTED Final    Comment: (NOTE) The GeneXpert MRSA Assay (FDA approved for NASAL specimens only), is one component of a comprehensive MRSA colonization surveillance program. It is not intended to diagnose MRSA infection nor to guide or monitor treatment for MRSA infections. Test performance is not FDA approved in patients less than 23 years old. Performed at [facility], [place] [address], [place], [place] [address]      Time coordinating discharge: 35 minutes  SIGNED:   Aline August, MD  [facility] 12/17/2021, 1:39 PM

## 2021-12-18 DIAGNOSIS — Z5189 Encounter for other specified aftercare: Secondary | ICD-10-CM | POA: Diagnosis not present

## 2021-12-18 DIAGNOSIS — R791 Abnormal coagulation profile: Secondary | ICD-10-CM | POA: Diagnosis not present

## 2021-12-18 DIAGNOSIS — S72002A Fracture of unspecified part of neck of left femur, initial encounter for closed fracture: Secondary | ICD-10-CM | POA: Diagnosis not present

## 2021-12-18 DIAGNOSIS — M25562 Pain in left knee: Secondary | ICD-10-CM | POA: Diagnosis not present

## 2021-12-18 DIAGNOSIS — M62838 Other muscle spasm: Secondary | ICD-10-CM | POA: Diagnosis not present

## 2021-12-18 DIAGNOSIS — I4891 Unspecified atrial fibrillation: Secondary | ICD-10-CM | POA: Diagnosis not present

## 2021-12-18 DIAGNOSIS — Z7901 Long term (current) use of anticoagulants: Secondary | ICD-10-CM | POA: Diagnosis not present

## 2021-12-18 DIAGNOSIS — E785 Hyperlipidemia, unspecified: Secondary | ICD-10-CM | POA: Diagnosis not present

## 2021-12-18 DIAGNOSIS — S82102A Unspecified fracture of upper end of left tibia, initial encounter for closed fracture: Secondary | ICD-10-CM | POA: Diagnosis not present

## 2021-12-18 DIAGNOSIS — I482 Chronic atrial fibrillation, unspecified: Secondary | ICD-10-CM | POA: Diagnosis not present

## 2021-12-18 DIAGNOSIS — I10 Essential (primary) hypertension: Secondary | ICD-10-CM | POA: Diagnosis not present

## 2021-12-18 DIAGNOSIS — I509 Heart failure, unspecified: Secondary | ICD-10-CM | POA: Diagnosis not present

## 2021-12-18 DIAGNOSIS — M25552 Pain in left hip: Secondary | ICD-10-CM | POA: Diagnosis not present

## 2021-12-18 DIAGNOSIS — S82202A Unspecified fracture of shaft of left tibia, initial encounter for closed fracture: Secondary | ICD-10-CM | POA: Diagnosis not present

## 2021-12-18 DIAGNOSIS — D6869 Other thrombophilia: Secondary | ICD-10-CM | POA: Diagnosis not present

## 2021-12-18 DIAGNOSIS — E871 Hypo-osmolality and hyponatremia: Secondary | ICD-10-CM | POA: Diagnosis not present

## 2021-12-18 DIAGNOSIS — Z4889 Encounter for other specified surgical aftercare: Secondary | ICD-10-CM | POA: Diagnosis not present

## 2021-12-18 DIAGNOSIS — Z96642 Presence of left artificial hip joint: Secondary | ICD-10-CM | POA: Diagnosis not present

## 2021-12-18 DIAGNOSIS — D649 Anemia, unspecified: Secondary | ICD-10-CM | POA: Diagnosis not present

## 2021-12-18 DIAGNOSIS — N400 Enlarged prostate without lower urinary tract symptoms: Secondary | ICD-10-CM | POA: Diagnosis not present

## 2021-12-18 DIAGNOSIS — D696 Thrombocytopenia, unspecified: Secondary | ICD-10-CM | POA: Diagnosis not present

## 2021-12-18 DIAGNOSIS — Z4789 Encounter for other orthopedic aftercare: Secondary | ICD-10-CM | POA: Diagnosis not present

## 2021-12-18 DIAGNOSIS — D638 Anemia in other chronic diseases classified elsewhere: Secondary | ICD-10-CM | POA: Diagnosis not present

## 2021-12-18 DIAGNOSIS — Z7401 Bed confinement status: Secondary | ICD-10-CM | POA: Diagnosis not present

## 2021-12-18 DIAGNOSIS — K5900 Constipation, unspecified: Secondary | ICD-10-CM | POA: Diagnosis not present

## 2021-12-18 DIAGNOSIS — S72002D Fracture of unspecified part of neck of left femur, subsequent encounter for closed fracture with routine healing: Secondary | ICD-10-CM | POA: Diagnosis not present

## 2021-12-18 DIAGNOSIS — S82142D Displaced bicondylar fracture of left tibia, subsequent encounter for closed fracture with routine healing: Secondary | ICD-10-CM | POA: Diagnosis not present

## 2021-12-18 DIAGNOSIS — R531 Weakness: Secondary | ICD-10-CM | POA: Diagnosis not present

## 2021-12-18 LAB — BASIC METABOLIC PANEL
Anion gap: 5 (ref 5–15)
BUN: 26 mg/dL — ABNORMAL HIGH (ref 8–23)
CO2: 28 mmol/L (ref 22–32)
Calcium: 8.6 mg/dL — ABNORMAL LOW (ref 8.9–10.3)
Chloride: 99 mmol/L (ref 98–111)
Creatinine, Ser: 0.81 mg/dL (ref 0.61–1.24)
GFR, Estimated: >60 mL/min (ref >60)
Glucose, Bld: 94 mg/dL (ref 70–99)
Potassium: 4.2 mmol/L (ref 3.5–5.1)
Sodium: 132 mmol/L — ABNORMAL LOW (ref 135–145)

## 2021-12-18 LAB — PROTIME-INR
INR: 2.2 — ABNORMAL HIGH (ref 0.8–1.2)
Prothrombin Time: 24.2 seconds — ABNORMAL HIGH (ref 11.4–15.2)

## 2021-12-18 LAB — MAGNESIUM: Magnesium: 2 mg/dL (ref 1.7–2.4)

## 2021-12-18 NOTE — Progress Notes (Addendum)
[facility] arrived to transport the patient. No change from am assessment. Pt is alert oriented x4, oob 2 assist. The pt left with belongings, bags x 2, cell phone and hearing aids.[name] (daughter) contacted with update regarding transport.

## 2021-12-18 NOTE — Progress Notes (Signed)
Report called to [facility] [place] [facility]. Questions, concerns were denied at this time.

## 2021-12-18 NOTE — Progress Notes (Signed)
Occupational Therapy Treatment Patient Details Name: Adam Richards MRN: [id number] DOB: [birth year] Today's Date: 12/18/2021   History of present illness 88 y.o. male presenting to [facility] ED after a witnessed fall while ascending steps. Landed on L knee. Imaging (+) for L displaced femoral neck fx, acute nondisplaced L proximal tibial plateau fx s/p L hemiarthroplasty and L knee aspiration for lipohemarthrosis on 12/15/21. PWB LLE 25-50%.   OT comments  OT treatment session with focus on bed mobility, sit to stand tranfers and stand-pivot transfers in prep for ADLs. Patient making steady progress toward goals. Remains highly motivated with great participation in therapy sessions. Continues to require set-up to Min A grossly for UB ADLs, +2 assist for LB ADLs, Min-Mod A +2 for sit to stand transfers from elevated surfaces and Min-Mod A +2 for stand-pivot transfers with use of RW. Requires Mod cues for technique, walker management and safe hand placement prior to sit to stand transfers. Patient would benefit from continued acute OT treatment services in prep for safe d/c to next level of care. D/c disposition remains appropriate.    Recommendations for follow up therapy are one component of a multi-disciplinary discharge planning process, led by the attending physician.  Recommendations may be updated based on patient status, additional functional criteria and insurance authorization.    Follow Up Recommendations  Skilled nursing-short term rehab (<3 hours/day)    Assistance Recommended at Discharge Frequent or constant Supervision/Assistance  Patient can return home with the following  Two people to help with walking and/or transfers;Two people to help with bathing/dressing/bathroom   Equipment Recommendations  Other (comment) (Defer to next level of care)    Recommendations for Other Services      Precautions / Restrictions Precautions Precautions: Fall Required Braces or Orthoses:  Splint/Cast Splint/Cast: Ace wrap to L knee Splint/Cast - Date Prophylactic Dressing Applied (if applicable): 23/76/28 Restrictions Weight Bearing Restrictions: Yes LLE Weight Bearing: Partial weight bearing LLE Partial Weight Bearing Percentage or Pounds: 25-50% per ortho note Other Position/Activity Restrictions: No noted ROM restrictions at L knee per ortho note       Mobility Bed Mobility Overal bed mobility: Needs Assistance Bed Mobility: Supine to Sit     Supine to sit: Mod assist     General bed mobility comments: Light Mod A at LLE and trunk with HOB elevated and use of bed features.    Transfers Overall transfer level: Needs assistance Equipment used: Rolling walker (2 wheels) Transfers: Sit to/from Stand, Bed to chair/wheelchair/BSC Sit to Stand: Max assist Stand pivot transfers: Mod assist         General transfer comment: Max A for sit to stand from elevated EOB to RW. Max cues for technique for stand-pivot to recliner. Able to maintain LLE PWB precautions with cues to offload LLE with BUE on RW.     Balance Overall balance assessment: Needs assistance Sitting-balance support: Single extremity supported, Feet supported Sitting balance-Leahy Scale: Good     Standing balance support: Bilateral upper extremity supported, Reliant on assistive device for balance Standing balance-Leahy Scale: Poor                             ADL either performed or assessed with clinical judgement   ADL Overall ADL's : Needs assistance/impaired Eating/Feeding: Set up;Sitting   Grooming: Set up;Sitting Grooming Details (indicate cue type and reason): 2/3 grooming tasks seated EOB.  Lower Body Dressing: Set up;Sit to/from stand   Toilet Transfer: Total assistance Toilet Transfer Details (indicate cue type and reason): TD to don footwear in supine           General ADL Comments: Grossly Max A to Mod A +2 for LB ADLs and sit to stand level  with RW.    Extremity/Trunk Assessment              Vision   Vision Assessment?: No apparent visual deficits   Perception     Praxis      Cognition Arousal/Alertness: Awake/alert Behavior During Therapy: WFL for tasks assessed/performed Overall Cognitive Status: Within Functional Limits for tasks assessed                                          Exercises      Shoulder Instructions       General Comments Motivated to participate.    Pertinent Vitals/ Pain       Pain Assessment Pain Assessment: 0-10 Pain Score: 3  Pain Location: L hip and knee Pain Descriptors / Indicators: Aching, Constant Pain Intervention(s): Limited activity within patient's tolerance, Monitored during session, Repositioned, Ice applied  Home Living                                          Prior Functioning/Environment              Frequency  Min 2X/week        Progress Toward Goals  OT Goals(current goals can now be found in the care plan section)  Progress towards OT goals: Progressing toward goals  Acute Rehab OT Goals Patient Stated Goal: To go to SNF rehab. OT Goal Formulation: With patient Time For Goal Achievement: 12/30/21 Potential to Achieve Goals: Good ADL Goals Pt Will Perform Upper Body Dressing: with set-up;sitting Pt Will Perform Lower Body Dressing: with mod assist;with adaptive equipment;sit to/from stand Pt Will Transfer to Toilet: with mod assist;bedside commode Pt Will Perform Toileting - Clothing Manipulation and hygiene: with mod assist;sitting/lateral leans Additional ADL Goal #1: Patient will complete sit to stand transfers with Mod A and use of RW with good adherence to PWB on LLE.  Plan Discharge plan remains appropriate;Frequency remains appropriate    Co-evaluation                 AM-PAC OT "6 Clicks" Daily Activity     Outcome Measure   Help from another person eating meals?: None Help from  another person taking care of personal grooming?: A Little Help from another person toileting, which includes using toliet, bedpan, or urinal?: Total Help from another person bathing (including washing, rinsing, drying)?: Total Help from another person to put on and taking off regular upper body clothing?: A Little Help from another person to put on and taking off regular lower body clothing?: Total 6 Click Score: 13    End of Session Equipment Utilized During Treatment: Gait belt  OT Visit Diagnosis: Unsteadiness on feet (R26.81);Muscle weakness (generalized) (M62.81);History of falling (Z91.81);Pain Pain - Right/Left: Left Pain - part of body: Hip (knee)   Activity Tolerance Patient tolerated treatment well   Patient Left in chair;with call bell/phone within reach;with chair alarm set   Nurse Communication Mobility status  Time: 0720-0740 OT Time Calculation (min): 20 min  Charges: OT General Charges $OT Visit: 1 Visit OT Treatments $Therapeutic Activity: 8-22 mins  Quincie Haroon H. OTR/L Supplemental OT, Department of rehab services [phone]  Earlie Schank R H. 12/18/2021, 7:53 AM

## 2021-12-18 NOTE — Progress Notes (Signed)
Patient is waiting to be discharged to SNF.  Currently medically stable for discharge.  Patient seen and examined at bedside.  Please refer to the full discharge summary done by me on 12/17/2021 for full details.

## 2021-12-18 NOTE — TOC Transition Note (Signed)
Transition of Care [facility]) - CM/SW Discharge Note   Patient Details  Name: Adam Richards MRN: [id number] Date of Birth: 04-03-1927  [facility] of Care [facility] CM/SW Contact:  Dessa Phi, RN Phone Number: 12/18/2021, 9:46 AM   Clinical Narrative: d/c today to [facility] rep [facility] accepted,& has a bed.d/c summary sent-going to rm#[id number],report tel#[phone]. PTAR called.      Final next level of care: Skilled Nursing Facility Barriers to Discharge: No Barriers Identified   Patient Goals and CMS Choice Patient states their goals for this hospitalization and ongoing recovery are:: Rehab CMS Medicare.gov Compare Post Acute Care list provided to:: Patient (Christina dtr) Choice offered to / list presented to : Adult Children  Discharge Placement PASRR number recieved: 12/15/21            Patient chooses bed at: [place], [place] Patient to be transferred to facility by:  Corey Harold) Name of family member notified:  (Christina Fisher(dtr)) Patient and family notified of of transfer: 12/18/21  Discharge Plan and Services   Discharge Planning Services: CM Consult                                 Social Determinants of Health ([place]) Interventions     Readmission Risk Interventions     No data to display

## 2021-12-19 DIAGNOSIS — I10 Essential (primary) hypertension: Secondary | ICD-10-CM | POA: Diagnosis not present

## 2021-12-19 DIAGNOSIS — D6869 Other thrombophilia: Secondary | ICD-10-CM | POA: Diagnosis not present

## 2021-12-19 DIAGNOSIS — N400 Enlarged prostate without lower urinary tract symptoms: Secondary | ICD-10-CM | POA: Diagnosis not present

## 2021-12-19 DIAGNOSIS — I4891 Unspecified atrial fibrillation: Secondary | ICD-10-CM | POA: Diagnosis not present

## 2021-12-19 DIAGNOSIS — S72002A Fracture of unspecified part of neck of left femur, initial encounter for closed fracture: Secondary | ICD-10-CM | POA: Diagnosis not present

## 2021-12-19 DIAGNOSIS — E871 Hypo-osmolality and hyponatremia: Secondary | ICD-10-CM | POA: Diagnosis not present

## 2021-12-19 DIAGNOSIS — E785 Hyperlipidemia, unspecified: Secondary | ICD-10-CM | POA: Diagnosis not present

## 2021-12-19 DIAGNOSIS — S82202A Unspecified fracture of shaft of left tibia, initial encounter for closed fracture: Secondary | ICD-10-CM | POA: Diagnosis not present

## 2021-12-22 ENCOUNTER — Other Ambulatory Visit: Payer: Self-pay | Admitting: *Deleted

## 2021-12-22 NOTE — Patient Outreach (Signed)
Per [place] eligible member currently resides in [facility] SNF.  Screening for potential  care coordination/care management needs as a benefit of Adam Richards insurance plan and PCP.  Member's PCP at [facility] at [place] has [facility] care management services available.   Adam Richards admitted to SNF on 12/18/21 after hospitalization.  Update received from Margot Chimes [facility]  SNF SW indicating Adam Richards is from [place] prior. Family has indicated Adam Richards may transition to [facility] ALF pending progress while in SNF.   Will continue to follow.    Marthenia Rolling, MSN, RN,BSN [place] Acute Care Coordinator [phone] [facility]) [phone]  (Toll free office)

## 2021-12-30 DIAGNOSIS — Z5189 Encounter for other specified aftercare: Secondary | ICD-10-CM | POA: Diagnosis not present

## 2021-12-30 DIAGNOSIS — M25562 Pain in left knee: Secondary | ICD-10-CM | POA: Diagnosis not present

## 2021-12-30 DIAGNOSIS — S82102A Unspecified fracture of upper end of left tibia, initial encounter for closed fracture: Secondary | ICD-10-CM | POA: Diagnosis not present

## 2021-12-31 ENCOUNTER — Other Ambulatory Visit: Payer: Self-pay | Admitting: *Deleted

## 2021-12-31 NOTE — Patient Outreach (Addendum)
THN Post- Acute Care Coordinator follow up. Mr. Herrman resides in [facility] SN[facility]. Screening for care coordination/care management services as a benefit of insurance plan and PCP.   PCP office [place] [facility] has [facility] care management services.  Update received from [place], [place] indicating Mr. Tomerlin is having blood pressure issues that nursing has been addressing.  Will continue to follow.   Marthenia Rolling, MSN, RN,BSN [place] Acute Care Coordinator [phone] [facility]) [phone]  (Toll free office)

## 2022-01-08 ENCOUNTER — Other Ambulatory Visit: Payer: Self-pay | Admitting: *Deleted

## 2022-01-08 NOTE — Patient Outreach (Signed)
THN Post- Acute Care Coordinator follow up.   Mr. Kerlin resides in [facility] SNF. Update received from Margot Chimes [facility] SNF SW indicating Mr. Wyszynski will transition to [facility] ALF on 01/12/22.  No care coordination/care management needs identified.    Marthenia Rolling, MSN, RN,BSN [place] Acute Care Coordinator [phone] [facility]) [phone]  (Toll free office)

## 2022-01-11 ENCOUNTER — Other Ambulatory Visit: Payer: Self-pay | Admitting: *Deleted

## 2022-01-11 NOTE — Patient Outreach (Signed)
THN  Post- Acute Care Coordinator follow up.   Facility site visit to [facility]. Met with Bryson Ha, SNF SW who indicates Mr. Barish has changed his mind and wants to return to ILF instead of ALF at [facility].   Met with Mr. Whidby at beside. States he plans to return to ILF. States " I don't know how much I will gain being in ALF than independent living." Writer mentioned he would have more oversight for his medical conditions. Mr. Kyllo states he wants to have his pt/inr followed closely when he returns to ILF. States he has already contacted his PCP office regarding this.   Discussed writer will notify [facility] care management of his pending discharge of tomorrow 01/12/22. His PCP office has [facility] care management services.   Provided writer's contact information.   Went back to speak with SNF SW to discuss conversation with Mr. Stankowski. However, she was off the unit. Sent update to SNF SW regarding need for home health RN for pt/inr lab draws.   Will continue to follow.   Marthenia Rolling, MSN, RN,BSN [place] Acute Care Coordinator [phone] [facility]) [phone]  (Toll free office)

## 2022-01-13 DIAGNOSIS — E785 Hyperlipidemia, unspecified: Secondary | ICD-10-CM | POA: Diagnosis not present

## 2022-01-13 DIAGNOSIS — Z9181 History of falling: Secondary | ICD-10-CM | POA: Diagnosis not present

## 2022-01-13 DIAGNOSIS — I10 Essential (primary) hypertension: Secondary | ICD-10-CM | POA: Diagnosis not present

## 2022-01-13 DIAGNOSIS — D696 Thrombocytopenia, unspecified: Secondary | ICD-10-CM | POA: Diagnosis not present

## 2022-01-13 DIAGNOSIS — D6859 Other primary thrombophilia: Secondary | ICD-10-CM | POA: Diagnosis not present

## 2022-01-13 DIAGNOSIS — N400 Enlarged prostate without lower urinary tract symptoms: Secondary | ICD-10-CM | POA: Diagnosis not present

## 2022-01-13 DIAGNOSIS — Z96642 Presence of left artificial hip joint: Secondary | ICD-10-CM | POA: Diagnosis not present

## 2022-01-13 DIAGNOSIS — I482 Chronic atrial fibrillation, unspecified: Secondary | ICD-10-CM | POA: Diagnosis not present

## 2022-01-13 DIAGNOSIS — S82145D Nondisplaced bicondylar fracture of left tibia, subsequent encounter for closed fracture with routine healing: Secondary | ICD-10-CM | POA: Diagnosis not present

## 2022-01-13 DIAGNOSIS — S82402D Unspecified fracture of shaft of left fibula, subsequent encounter for closed fracture with routine healing: Secondary | ICD-10-CM | POA: Diagnosis not present

## 2022-01-13 DIAGNOSIS — S72002D Fracture of unspecified part of neck of left femur, subsequent encounter for closed fracture with routine healing: Secondary | ICD-10-CM | POA: Diagnosis not present

## 2022-01-13 DIAGNOSIS — D649 Anemia, unspecified: Secondary | ICD-10-CM | POA: Diagnosis not present

## 2022-01-13 DIAGNOSIS — Z5181 Encounter for therapeutic drug level monitoring: Secondary | ICD-10-CM | POA: Diagnosis not present

## 2022-01-13 DIAGNOSIS — Z7901 Long term (current) use of anticoagulants: Secondary | ICD-10-CM | POA: Diagnosis not present

## 2022-01-14 ENCOUNTER — Other Ambulatory Visit: Payer: Self-pay | Admitting: *Deleted

## 2022-01-14 DIAGNOSIS — S82402D Unspecified fracture of shaft of left fibula, subsequent encounter for closed fracture with routine healing: Secondary | ICD-10-CM | POA: Diagnosis not present

## 2022-01-14 DIAGNOSIS — I482 Chronic atrial fibrillation, unspecified: Secondary | ICD-10-CM | POA: Diagnosis not present

## 2022-01-14 DIAGNOSIS — Z96642 Presence of left artificial hip joint: Secondary | ICD-10-CM | POA: Diagnosis not present

## 2022-01-14 DIAGNOSIS — I10 Essential (primary) hypertension: Secondary | ICD-10-CM | POA: Diagnosis not present

## 2022-01-14 DIAGNOSIS — S82145D Nondisplaced bicondylar fracture of left tibia, subsequent encounter for closed fracture with routine healing: Secondary | ICD-10-CM | POA: Diagnosis not present

## 2022-01-14 DIAGNOSIS — S72002D Fracture of unspecified part of neck of left femur, subsequent encounter for closed fracture with routine healing: Secondary | ICD-10-CM | POA: Diagnosis not present

## 2022-01-14 NOTE — Patient Outreach (Signed)
[place] Coordinator follow up.    Verified in [facility] Adam Richards discharged from [facility] on 01/12/22 as planned. Verified with [facility] facility that he returned to IL at [facility] not ALF.   Will send secure notification to [facility] care management.    Marthenia Rolling, MSN, RN,BSN [place] Acute Care Coordinator [phone] [facility]) [phone]  (Toll free office)

## 2022-01-15 DIAGNOSIS — I482 Chronic atrial fibrillation, unspecified: Secondary | ICD-10-CM | POA: Diagnosis not present

## 2022-01-15 DIAGNOSIS — S82145D Nondisplaced bicondylar fracture of left tibia, subsequent encounter for closed fracture with routine healing: Secondary | ICD-10-CM | POA: Diagnosis not present

## 2022-01-15 DIAGNOSIS — S72002D Fracture of unspecified part of neck of left femur, subsequent encounter for closed fracture with routine healing: Secondary | ICD-10-CM | POA: Diagnosis not present

## 2022-01-15 DIAGNOSIS — I10 Essential (primary) hypertension: Secondary | ICD-10-CM | POA: Diagnosis not present

## 2022-01-15 DIAGNOSIS — Z96642 Presence of left artificial hip joint: Secondary | ICD-10-CM | POA: Diagnosis not present

## 2022-01-15 DIAGNOSIS — S82402D Unspecified fracture of shaft of left fibula, subsequent encounter for closed fracture with routine healing: Secondary | ICD-10-CM | POA: Diagnosis not present

## 2022-01-20 DIAGNOSIS — S72002D Fracture of unspecified part of neck of left femur, subsequent encounter for closed fracture with routine healing: Secondary | ICD-10-CM | POA: Diagnosis not present

## 2022-01-20 DIAGNOSIS — I482 Chronic atrial fibrillation, unspecified: Secondary | ICD-10-CM | POA: Diagnosis not present

## 2022-01-20 DIAGNOSIS — Z96642 Presence of left artificial hip joint: Secondary | ICD-10-CM | POA: Diagnosis not present

## 2022-01-20 DIAGNOSIS — S82402D Unspecified fracture of shaft of left fibula, subsequent encounter for closed fracture with routine healing: Secondary | ICD-10-CM | POA: Diagnosis not present

## 2022-01-20 DIAGNOSIS — I10 Essential (primary) hypertension: Secondary | ICD-10-CM | POA: Diagnosis not present

## 2022-01-20 DIAGNOSIS — S82145D Nondisplaced bicondylar fracture of left tibia, subsequent encounter for closed fracture with routine healing: Secondary | ICD-10-CM | POA: Diagnosis not present

## 2022-01-21 DIAGNOSIS — I4891 Unspecified atrial fibrillation: Secondary | ICD-10-CM | POA: Diagnosis not present

## 2022-01-21 DIAGNOSIS — I10 Essential (primary) hypertension: Secondary | ICD-10-CM | POA: Diagnosis not present

## 2022-01-21 DIAGNOSIS — Z8781 Personal history of (healed) traumatic fracture: Secondary | ICD-10-CM | POA: Diagnosis not present

## 2022-01-21 DIAGNOSIS — Z7901 Long term (current) use of anticoagulants: Secondary | ICD-10-CM | POA: Diagnosis not present

## 2022-01-21 DIAGNOSIS — D649 Anemia, unspecified: Secondary | ICD-10-CM | POA: Diagnosis not present

## 2022-01-22 DIAGNOSIS — S72002D Fracture of unspecified part of neck of left femur, subsequent encounter for closed fracture with routine healing: Secondary | ICD-10-CM | POA: Diagnosis not present

## 2022-01-22 DIAGNOSIS — S82402D Unspecified fracture of shaft of left fibula, subsequent encounter for closed fracture with routine healing: Secondary | ICD-10-CM | POA: Diagnosis not present

## 2022-01-22 DIAGNOSIS — I482 Chronic atrial fibrillation, unspecified: Secondary | ICD-10-CM | POA: Diagnosis not present

## 2022-01-22 DIAGNOSIS — Z96642 Presence of left artificial hip joint: Secondary | ICD-10-CM | POA: Diagnosis not present

## 2022-01-22 DIAGNOSIS — S82145D Nondisplaced bicondylar fracture of left tibia, subsequent encounter for closed fracture with routine healing: Secondary | ICD-10-CM | POA: Diagnosis not present

## 2022-01-22 DIAGNOSIS — I10 Essential (primary) hypertension: Secondary | ICD-10-CM | POA: Diagnosis not present

## 2022-01-28 DIAGNOSIS — S82402D Unspecified fracture of shaft of left fibula, subsequent encounter for closed fracture with routine healing: Secondary | ICD-10-CM | POA: Diagnosis not present

## 2022-01-28 DIAGNOSIS — I482 Chronic atrial fibrillation, unspecified: Secondary | ICD-10-CM | POA: Diagnosis not present

## 2022-01-28 DIAGNOSIS — I10 Essential (primary) hypertension: Secondary | ICD-10-CM | POA: Diagnosis not present

## 2022-01-28 DIAGNOSIS — S82145D Nondisplaced bicondylar fracture of left tibia, subsequent encounter for closed fracture with routine healing: Secondary | ICD-10-CM | POA: Diagnosis not present

## 2022-01-28 DIAGNOSIS — S72002D Fracture of unspecified part of neck of left femur, subsequent encounter for closed fracture with routine healing: Secondary | ICD-10-CM | POA: Diagnosis not present

## 2022-01-28 DIAGNOSIS — Z96642 Presence of left artificial hip joint: Secondary | ICD-10-CM | POA: Diagnosis not present

## 2022-02-03 DIAGNOSIS — Z5189 Encounter for other specified aftercare: Secondary | ICD-10-CM | POA: Diagnosis not present

## 2022-02-03 DIAGNOSIS — S82402D Unspecified fracture of shaft of left fibula, subsequent encounter for closed fracture with routine healing: Secondary | ICD-10-CM | POA: Diagnosis not present

## 2022-02-03 DIAGNOSIS — S82102A Unspecified fracture of upper end of left tibia, initial encounter for closed fracture: Secondary | ICD-10-CM | POA: Diagnosis not present

## 2022-02-03 DIAGNOSIS — I482 Chronic atrial fibrillation, unspecified: Secondary | ICD-10-CM | POA: Diagnosis not present

## 2022-02-03 DIAGNOSIS — I10 Essential (primary) hypertension: Secondary | ICD-10-CM | POA: Diagnosis not present

## 2022-02-03 DIAGNOSIS — S82145D Nondisplaced bicondylar fracture of left tibia, subsequent encounter for closed fracture with routine healing: Secondary | ICD-10-CM | POA: Diagnosis not present

## 2022-02-03 DIAGNOSIS — M25562 Pain in left knee: Secondary | ICD-10-CM | POA: Diagnosis not present

## 2022-02-03 DIAGNOSIS — S72002D Fracture of unspecified part of neck of left femur, subsequent encounter for closed fracture with routine healing: Secondary | ICD-10-CM | POA: Diagnosis not present

## 2022-02-03 DIAGNOSIS — Z96642 Presence of left artificial hip joint: Secondary | ICD-10-CM | POA: Diagnosis not present

## 2022-02-04 DIAGNOSIS — Z96642 Presence of left artificial hip joint: Secondary | ICD-10-CM | POA: Diagnosis not present

## 2022-02-04 DIAGNOSIS — S82402D Unspecified fracture of shaft of left fibula, subsequent encounter for closed fracture with routine healing: Secondary | ICD-10-CM | POA: Diagnosis not present

## 2022-02-04 DIAGNOSIS — S82145D Nondisplaced bicondylar fracture of left tibia, subsequent encounter for closed fracture with routine healing: Secondary | ICD-10-CM | POA: Diagnosis not present

## 2022-02-04 DIAGNOSIS — I482 Chronic atrial fibrillation, unspecified: Secondary | ICD-10-CM | POA: Diagnosis not present

## 2022-02-04 DIAGNOSIS — I10 Essential (primary) hypertension: Secondary | ICD-10-CM | POA: Diagnosis not present

## 2022-02-04 DIAGNOSIS — S72002D Fracture of unspecified part of neck of left femur, subsequent encounter for closed fracture with routine healing: Secondary | ICD-10-CM | POA: Diagnosis not present

## 2022-02-09 DIAGNOSIS — S82402D Unspecified fracture of shaft of left fibula, subsequent encounter for closed fracture with routine healing: Secondary | ICD-10-CM | POA: Diagnosis not present

## 2022-02-09 DIAGNOSIS — Z96642 Presence of left artificial hip joint: Secondary | ICD-10-CM | POA: Diagnosis not present

## 2022-02-09 DIAGNOSIS — S72002D Fracture of unspecified part of neck of left femur, subsequent encounter for closed fracture with routine healing: Secondary | ICD-10-CM | POA: Diagnosis not present

## 2022-02-09 DIAGNOSIS — S82145D Nondisplaced bicondylar fracture of left tibia, subsequent encounter for closed fracture with routine healing: Secondary | ICD-10-CM | POA: Diagnosis not present

## 2022-02-09 DIAGNOSIS — I10 Essential (primary) hypertension: Secondary | ICD-10-CM | POA: Diagnosis not present

## 2022-02-09 DIAGNOSIS — I482 Chronic atrial fibrillation, unspecified: Secondary | ICD-10-CM | POA: Diagnosis not present

## 2022-02-11 DIAGNOSIS — S82402D Unspecified fracture of shaft of left fibula, subsequent encounter for closed fracture with routine healing: Secondary | ICD-10-CM | POA: Diagnosis not present

## 2022-02-11 DIAGNOSIS — Z96642 Presence of left artificial hip joint: Secondary | ICD-10-CM | POA: Diagnosis not present

## 2022-02-11 DIAGNOSIS — I10 Essential (primary) hypertension: Secondary | ICD-10-CM | POA: Diagnosis not present

## 2022-02-11 DIAGNOSIS — S72002D Fracture of unspecified part of neck of left femur, subsequent encounter for closed fracture with routine healing: Secondary | ICD-10-CM | POA: Diagnosis not present

## 2022-02-11 DIAGNOSIS — S82145D Nondisplaced bicondylar fracture of left tibia, subsequent encounter for closed fracture with routine healing: Secondary | ICD-10-CM | POA: Diagnosis not present

## 2022-02-11 DIAGNOSIS — I482 Chronic atrial fibrillation, unspecified: Secondary | ICD-10-CM | POA: Diagnosis not present

## 2022-02-19 DIAGNOSIS — Z7901 Long term (current) use of anticoagulants: Secondary | ICD-10-CM | POA: Diagnosis not present

## 2022-02-25 DIAGNOSIS — R2689 Other abnormalities of gait and mobility: Secondary | ICD-10-CM | POA: Diagnosis not present

## 2022-02-25 DIAGNOSIS — Z4789 Encounter for other orthopedic aftercare: Secondary | ICD-10-CM | POA: Diagnosis not present

## 2022-02-25 DIAGNOSIS — R296 Repeated falls: Secondary | ICD-10-CM | POA: Diagnosis not present

## 2022-02-26 DIAGNOSIS — R296 Repeated falls: Secondary | ICD-10-CM | POA: Diagnosis not present

## 2022-02-26 DIAGNOSIS — R2689 Other abnormalities of gait and mobility: Secondary | ICD-10-CM | POA: Diagnosis not present

## 2022-02-26 DIAGNOSIS — Z4789 Encounter for other orthopedic aftercare: Secondary | ICD-10-CM | POA: Diagnosis not present

## 2022-03-02 DIAGNOSIS — Z4789 Encounter for other orthopedic aftercare: Secondary | ICD-10-CM | POA: Diagnosis not present

## 2022-03-02 DIAGNOSIS — R296 Repeated falls: Secondary | ICD-10-CM | POA: Diagnosis not present

## 2022-03-02 DIAGNOSIS — R2689 Other abnormalities of gait and mobility: Secondary | ICD-10-CM | POA: Diagnosis not present

## 2022-03-04 DIAGNOSIS — D6869 Other thrombophilia: Secondary | ICD-10-CM | POA: Diagnosis not present

## 2022-03-04 DIAGNOSIS — R2689 Other abnormalities of gait and mobility: Secondary | ICD-10-CM | POA: Diagnosis not present

## 2022-03-04 DIAGNOSIS — I4891 Unspecified atrial fibrillation: Secondary | ICD-10-CM | POA: Diagnosis not present

## 2022-03-04 DIAGNOSIS — R296 Repeated falls: Secondary | ICD-10-CM | POA: Diagnosis not present

## 2022-03-04 DIAGNOSIS — I270 Primary pulmonary hypertension: Secondary | ICD-10-CM | POA: Diagnosis not present

## 2022-03-04 DIAGNOSIS — Z4789 Encounter for other orthopedic aftercare: Secondary | ICD-10-CM | POA: Diagnosis not present

## 2022-03-04 DIAGNOSIS — I10 Essential (primary) hypertension: Secondary | ICD-10-CM | POA: Diagnosis not present

## 2022-03-04 DIAGNOSIS — E7800 Pure hypercholesterolemia, unspecified: Secondary | ICD-10-CM | POA: Diagnosis not present

## 2022-03-04 DIAGNOSIS — I700 Atherosclerosis of aorta: Secondary | ICD-10-CM | POA: Diagnosis not present

## 2022-03-04 DIAGNOSIS — N400 Enlarged prostate without lower urinary tract symptoms: Secondary | ICD-10-CM | POA: Diagnosis not present

## 2022-03-04 DIAGNOSIS — Z7901 Long term (current) use of anticoagulants: Secondary | ICD-10-CM | POA: Diagnosis not present

## 2022-03-05 DIAGNOSIS — R2689 Other abnormalities of gait and mobility: Secondary | ICD-10-CM | POA: Diagnosis not present

## 2022-03-05 DIAGNOSIS — R296 Repeated falls: Secondary | ICD-10-CM | POA: Diagnosis not present

## 2022-03-05 DIAGNOSIS — Z4789 Encounter for other orthopedic aftercare: Secondary | ICD-10-CM | POA: Diagnosis not present

## 2022-03-08 DIAGNOSIS — R2689 Other abnormalities of gait and mobility: Secondary | ICD-10-CM | POA: Diagnosis not present

## 2022-03-08 DIAGNOSIS — Z4789 Encounter for other orthopedic aftercare: Secondary | ICD-10-CM | POA: Diagnosis not present

## 2022-03-08 DIAGNOSIS — R296 Repeated falls: Secondary | ICD-10-CM | POA: Diagnosis not present

## 2022-03-10 DIAGNOSIS — Z4789 Encounter for other orthopedic aftercare: Secondary | ICD-10-CM | POA: Diagnosis not present

## 2022-03-10 DIAGNOSIS — R2689 Other abnormalities of gait and mobility: Secondary | ICD-10-CM | POA: Diagnosis not present

## 2022-03-10 DIAGNOSIS — R296 Repeated falls: Secondary | ICD-10-CM | POA: Diagnosis not present

## 2022-03-15 DIAGNOSIS — Z4789 Encounter for other orthopedic aftercare: Secondary | ICD-10-CM | POA: Diagnosis not present

## 2022-03-15 DIAGNOSIS — R2689 Other abnormalities of gait and mobility: Secondary | ICD-10-CM | POA: Diagnosis not present

## 2022-03-15 DIAGNOSIS — R296 Repeated falls: Secondary | ICD-10-CM | POA: Diagnosis not present

## 2022-03-17 DIAGNOSIS — R2689 Other abnormalities of gait and mobility: Secondary | ICD-10-CM | POA: Diagnosis not present

## 2022-03-17 DIAGNOSIS — Z4789 Encounter for other orthopedic aftercare: Secondary | ICD-10-CM | POA: Diagnosis not present

## 2022-03-17 DIAGNOSIS — R296 Repeated falls: Secondary | ICD-10-CM | POA: Diagnosis not present

## 2022-03-19 DIAGNOSIS — Z4789 Encounter for other orthopedic aftercare: Secondary | ICD-10-CM | POA: Diagnosis not present

## 2022-03-19 DIAGNOSIS — R296 Repeated falls: Secondary | ICD-10-CM | POA: Diagnosis not present

## 2022-03-19 DIAGNOSIS — R2689 Other abnormalities of gait and mobility: Secondary | ICD-10-CM | POA: Diagnosis not present

## 2022-03-24 DIAGNOSIS — Z4789 Encounter for other orthopedic aftercare: Secondary | ICD-10-CM | POA: Diagnosis not present

## 2022-03-24 DIAGNOSIS — R2689 Other abnormalities of gait and mobility: Secondary | ICD-10-CM | POA: Diagnosis not present

## 2022-03-24 DIAGNOSIS — R296 Repeated falls: Secondary | ICD-10-CM | POA: Diagnosis not present

## 2022-03-26 DIAGNOSIS — Z4789 Encounter for other orthopedic aftercare: Secondary | ICD-10-CM | POA: Diagnosis not present

## 2022-03-26 DIAGNOSIS — R2689 Other abnormalities of gait and mobility: Secondary | ICD-10-CM | POA: Diagnosis not present

## 2022-03-26 DIAGNOSIS — R296 Repeated falls: Secondary | ICD-10-CM | POA: Diagnosis not present

## 2022-03-29 DIAGNOSIS — R2689 Other abnormalities of gait and mobility: Secondary | ICD-10-CM | POA: Diagnosis not present

## 2022-03-29 DIAGNOSIS — Z4789 Encounter for other orthopedic aftercare: Secondary | ICD-10-CM | POA: Diagnosis not present

## 2022-03-29 DIAGNOSIS — R296 Repeated falls: Secondary | ICD-10-CM | POA: Diagnosis not present

## 2022-03-31 DIAGNOSIS — R296 Repeated falls: Secondary | ICD-10-CM | POA: Diagnosis not present

## 2022-03-31 DIAGNOSIS — R2689 Other abnormalities of gait and mobility: Secondary | ICD-10-CM | POA: Diagnosis not present

## 2022-03-31 DIAGNOSIS — Z4789 Encounter for other orthopedic aftercare: Secondary | ICD-10-CM | POA: Diagnosis not present

## 2022-04-02 DIAGNOSIS — R296 Repeated falls: Secondary | ICD-10-CM | POA: Diagnosis not present

## 2022-04-02 DIAGNOSIS — R2689 Other abnormalities of gait and mobility: Secondary | ICD-10-CM | POA: Diagnosis not present

## 2022-04-02 DIAGNOSIS — Z4789 Encounter for other orthopedic aftercare: Secondary | ICD-10-CM | POA: Diagnosis not present

## 2022-04-05 DIAGNOSIS — R296 Repeated falls: Secondary | ICD-10-CM | POA: Diagnosis not present

## 2022-04-05 DIAGNOSIS — Z4789 Encounter for other orthopedic aftercare: Secondary | ICD-10-CM | POA: Diagnosis not present

## 2022-04-05 DIAGNOSIS — R2689 Other abnormalities of gait and mobility: Secondary | ICD-10-CM | POA: Diagnosis not present

## 2022-04-07 DIAGNOSIS — R296 Repeated falls: Secondary | ICD-10-CM | POA: Diagnosis not present

## 2022-04-07 DIAGNOSIS — Z4789 Encounter for other orthopedic aftercare: Secondary | ICD-10-CM | POA: Diagnosis not present

## 2022-04-07 DIAGNOSIS — R2689 Other abnormalities of gait and mobility: Secondary | ICD-10-CM | POA: Diagnosis not present

## 2022-04-09 DIAGNOSIS — R2689 Other abnormalities of gait and mobility: Secondary | ICD-10-CM | POA: Diagnosis not present

## 2022-04-09 DIAGNOSIS — R296 Repeated falls: Secondary | ICD-10-CM | POA: Diagnosis not present

## 2022-04-09 DIAGNOSIS — Z4789 Encounter for other orthopedic aftercare: Secondary | ICD-10-CM | POA: Diagnosis not present

## 2022-04-12 DIAGNOSIS — R2689 Other abnormalities of gait and mobility: Secondary | ICD-10-CM | POA: Diagnosis not present

## 2022-04-12 DIAGNOSIS — R296 Repeated falls: Secondary | ICD-10-CM | POA: Diagnosis not present

## 2022-04-12 DIAGNOSIS — Z4789 Encounter for other orthopedic aftercare: Secondary | ICD-10-CM | POA: Diagnosis not present

## 2022-04-14 DIAGNOSIS — R2689 Other abnormalities of gait and mobility: Secondary | ICD-10-CM | POA: Diagnosis not present

## 2022-04-14 DIAGNOSIS — R296 Repeated falls: Secondary | ICD-10-CM | POA: Diagnosis not present

## 2022-04-14 DIAGNOSIS — Z4789 Encounter for other orthopedic aftercare: Secondary | ICD-10-CM | POA: Diagnosis not present

## 2022-04-19 DIAGNOSIS — R296 Repeated falls: Secondary | ICD-10-CM | POA: Diagnosis not present

## 2022-04-19 DIAGNOSIS — Z4789 Encounter for other orthopedic aftercare: Secondary | ICD-10-CM | POA: Diagnosis not present

## 2022-04-19 DIAGNOSIS — R2689 Other abnormalities of gait and mobility: Secondary | ICD-10-CM | POA: Diagnosis not present

## 2022-04-20 DIAGNOSIS — Z7901 Long term (current) use of anticoagulants: Secondary | ICD-10-CM | POA: Diagnosis not present

## 2022-04-21 DIAGNOSIS — L570 Actinic keratosis: Secondary | ICD-10-CM | POA: Diagnosis not present

## 2022-04-21 DIAGNOSIS — Z85828 Personal history of other malignant neoplasm of skin: Secondary | ICD-10-CM | POA: Diagnosis not present

## 2022-04-21 DIAGNOSIS — R2689 Other abnormalities of gait and mobility: Secondary | ICD-10-CM | POA: Diagnosis not present

## 2022-04-21 DIAGNOSIS — D1801 Hemangioma of skin and subcutaneous tissue: Secondary | ICD-10-CM | POA: Diagnosis not present

## 2022-04-21 DIAGNOSIS — D485 Neoplasm of uncertain behavior of skin: Secondary | ICD-10-CM | POA: Diagnosis not present

## 2022-04-21 DIAGNOSIS — D170 Benign lipomatous neoplasm of skin and subcutaneous tissue of head, face and neck: Secondary | ICD-10-CM | POA: Diagnosis not present

## 2022-04-21 DIAGNOSIS — Z4789 Encounter for other orthopedic aftercare: Secondary | ICD-10-CM | POA: Diagnosis not present

## 2022-04-21 DIAGNOSIS — I4891 Unspecified atrial fibrillation: Secondary | ICD-10-CM | POA: Diagnosis not present

## 2022-04-21 DIAGNOSIS — I270 Primary pulmonary hypertension: Secondary | ICD-10-CM | POA: Diagnosis not present

## 2022-04-21 DIAGNOSIS — E7800 Pure hypercholesterolemia, unspecified: Secondary | ICD-10-CM | POA: Diagnosis not present

## 2022-04-21 DIAGNOSIS — Z872 Personal history of diseases of the skin and subcutaneous tissue: Secondary | ICD-10-CM | POA: Diagnosis not present

## 2022-04-21 DIAGNOSIS — R296 Repeated falls: Secondary | ICD-10-CM | POA: Diagnosis not present

## 2022-04-21 DIAGNOSIS — L821 Other seborrheic keratosis: Secondary | ICD-10-CM | POA: Diagnosis not present

## 2022-04-21 DIAGNOSIS — Z08 Encounter for follow-up examination after completed treatment for malignant neoplasm: Secondary | ICD-10-CM | POA: Diagnosis not present

## 2022-04-21 DIAGNOSIS — L814 Other melanin hyperpigmentation: Secondary | ICD-10-CM | POA: Diagnosis not present

## 2022-04-21 DIAGNOSIS — I10 Essential (primary) hypertension: Secondary | ICD-10-CM | POA: Diagnosis not present

## 2022-04-21 DIAGNOSIS — N400 Enlarged prostate without lower urinary tract symptoms: Secondary | ICD-10-CM | POA: Diagnosis not present

## 2022-04-23 DIAGNOSIS — R296 Repeated falls: Secondary | ICD-10-CM | POA: Diagnosis not present

## 2022-04-23 DIAGNOSIS — Z4789 Encounter for other orthopedic aftercare: Secondary | ICD-10-CM | POA: Diagnosis not present

## 2022-04-23 DIAGNOSIS — R2689 Other abnormalities of gait and mobility: Secondary | ICD-10-CM | POA: Diagnosis not present

## 2022-04-26 DIAGNOSIS — Z4789 Encounter for other orthopedic aftercare: Secondary | ICD-10-CM | POA: Diagnosis not present

## 2022-04-26 DIAGNOSIS — R2689 Other abnormalities of gait and mobility: Secondary | ICD-10-CM | POA: Diagnosis not present

## 2022-04-26 DIAGNOSIS — R296 Repeated falls: Secondary | ICD-10-CM | POA: Diagnosis not present

## 2022-04-28 DIAGNOSIS — R2689 Other abnormalities of gait and mobility: Secondary | ICD-10-CM | POA: Diagnosis not present

## 2022-04-28 DIAGNOSIS — Z4789 Encounter for other orthopedic aftercare: Secondary | ICD-10-CM | POA: Diagnosis not present

## 2022-04-28 DIAGNOSIS — R296 Repeated falls: Secondary | ICD-10-CM | POA: Diagnosis not present

## 2022-04-30 DIAGNOSIS — R296 Repeated falls: Secondary | ICD-10-CM | POA: Diagnosis not present

## 2022-04-30 DIAGNOSIS — R2689 Other abnormalities of gait and mobility: Secondary | ICD-10-CM | POA: Diagnosis not present

## 2022-04-30 DIAGNOSIS — Z4789 Encounter for other orthopedic aftercare: Secondary | ICD-10-CM | POA: Diagnosis not present

## 2022-05-04 DIAGNOSIS — Z7901 Long term (current) use of anticoagulants: Secondary | ICD-10-CM | POA: Diagnosis not present

## 2022-05-05 DIAGNOSIS — R2689 Other abnormalities of gait and mobility: Secondary | ICD-10-CM | POA: Diagnosis not present

## 2022-05-05 DIAGNOSIS — R296 Repeated falls: Secondary | ICD-10-CM | POA: Diagnosis not present

## 2022-05-05 DIAGNOSIS — Z4789 Encounter for other orthopedic aftercare: Secondary | ICD-10-CM | POA: Diagnosis not present

## 2022-05-07 DIAGNOSIS — R296 Repeated falls: Secondary | ICD-10-CM | POA: Diagnosis not present

## 2022-05-07 DIAGNOSIS — R2689 Other abnormalities of gait and mobility: Secondary | ICD-10-CM | POA: Diagnosis not present

## 2022-05-07 DIAGNOSIS — Z4789 Encounter for other orthopedic aftercare: Secondary | ICD-10-CM | POA: Diagnosis not present

## 2022-05-09 DIAGNOSIS — I270 Primary pulmonary hypertension: Secondary | ICD-10-CM | POA: Diagnosis not present

## 2022-05-09 DIAGNOSIS — I10 Essential (primary) hypertension: Secondary | ICD-10-CM | POA: Diagnosis not present

## 2022-05-09 DIAGNOSIS — E7800 Pure hypercholesterolemia, unspecified: Secondary | ICD-10-CM | POA: Diagnosis not present

## 2022-05-09 DIAGNOSIS — N400 Enlarged prostate without lower urinary tract symptoms: Secondary | ICD-10-CM | POA: Diagnosis not present

## 2022-05-10 DIAGNOSIS — R2689 Other abnormalities of gait and mobility: Secondary | ICD-10-CM | POA: Diagnosis not present

## 2022-05-10 DIAGNOSIS — R296 Repeated falls: Secondary | ICD-10-CM | POA: Diagnosis not present

## 2022-05-10 DIAGNOSIS — Z4789 Encounter for other orthopedic aftercare: Secondary | ICD-10-CM | POA: Diagnosis not present

## 2022-05-12 DIAGNOSIS — R296 Repeated falls: Secondary | ICD-10-CM | POA: Diagnosis not present

## 2022-05-12 DIAGNOSIS — Z4789 Encounter for other orthopedic aftercare: Secondary | ICD-10-CM | POA: Diagnosis not present

## 2022-05-12 DIAGNOSIS — R2689 Other abnormalities of gait and mobility: Secondary | ICD-10-CM | POA: Diagnosis not present

## 2022-05-19 DIAGNOSIS — Z4789 Encounter for other orthopedic aftercare: Secondary | ICD-10-CM | POA: Diagnosis not present

## 2022-05-19 DIAGNOSIS — R2689 Other abnormalities of gait and mobility: Secondary | ICD-10-CM | POA: Diagnosis not present

## 2022-05-19 DIAGNOSIS — R296 Repeated falls: Secondary | ICD-10-CM | POA: Diagnosis not present

## 2022-05-21 DIAGNOSIS — Z4789 Encounter for other orthopedic aftercare: Secondary | ICD-10-CM | POA: Diagnosis not present

## 2022-05-21 DIAGNOSIS — R2689 Other abnormalities of gait and mobility: Secondary | ICD-10-CM | POA: Diagnosis not present

## 2022-05-21 DIAGNOSIS — R296 Repeated falls: Secondary | ICD-10-CM | POA: Diagnosis not present

## 2022-05-26 DIAGNOSIS — Z4789 Encounter for other orthopedic aftercare: Secondary | ICD-10-CM | POA: Diagnosis not present

## 2022-05-26 DIAGNOSIS — R2689 Other abnormalities of gait and mobility: Secondary | ICD-10-CM | POA: Diagnosis not present

## 2022-05-26 DIAGNOSIS — R296 Repeated falls: Secondary | ICD-10-CM | POA: Diagnosis not present

## 2022-05-28 DIAGNOSIS — R2689 Other abnormalities of gait and mobility: Secondary | ICD-10-CM | POA: Diagnosis not present

## 2022-05-28 DIAGNOSIS — Z4789 Encounter for other orthopedic aftercare: Secondary | ICD-10-CM | POA: Diagnosis not present

## 2022-05-28 DIAGNOSIS — R296 Repeated falls: Secondary | ICD-10-CM | POA: Diagnosis not present

## 2022-05-31 DIAGNOSIS — R2689 Other abnormalities of gait and mobility: Secondary | ICD-10-CM | POA: Diagnosis not present

## 2022-05-31 DIAGNOSIS — R296 Repeated falls: Secondary | ICD-10-CM | POA: Diagnosis not present

## 2022-05-31 DIAGNOSIS — Z4789 Encounter for other orthopedic aftercare: Secondary | ICD-10-CM | POA: Diagnosis not present

## 2022-06-02 DIAGNOSIS — Z4789 Encounter for other orthopedic aftercare: Secondary | ICD-10-CM | POA: Diagnosis not present

## 2022-06-02 DIAGNOSIS — R296 Repeated falls: Secondary | ICD-10-CM | POA: Diagnosis not present

## 2022-06-02 DIAGNOSIS — R2689 Other abnormalities of gait and mobility: Secondary | ICD-10-CM | POA: Diagnosis not present

## 2022-06-03 DIAGNOSIS — I270 Primary pulmonary hypertension: Secondary | ICD-10-CM | POA: Diagnosis not present

## 2022-06-03 DIAGNOSIS — I10 Essential (primary) hypertension: Secondary | ICD-10-CM | POA: Diagnosis not present

## 2022-06-03 DIAGNOSIS — Q6100 Congenital renal cyst, unspecified: Secondary | ICD-10-CM | POA: Diagnosis not present

## 2022-06-03 DIAGNOSIS — E7800 Pure hypercholesterolemia, unspecified: Secondary | ICD-10-CM | POA: Diagnosis not present

## 2022-06-03 DIAGNOSIS — I700 Atherosclerosis of aorta: Secondary | ICD-10-CM | POA: Diagnosis not present

## 2022-06-03 DIAGNOSIS — R32 Unspecified urinary incontinence: Secondary | ICD-10-CM | POA: Diagnosis not present

## 2022-06-03 DIAGNOSIS — I4891 Unspecified atrial fibrillation: Secondary | ICD-10-CM | POA: Diagnosis not present

## 2022-06-03 DIAGNOSIS — N400 Enlarged prostate without lower urinary tract symptoms: Secondary | ICD-10-CM | POA: Diagnosis not present

## 2022-06-03 DIAGNOSIS — Z7901 Long term (current) use of anticoagulants: Secondary | ICD-10-CM | POA: Diagnosis not present

## 2022-06-03 DIAGNOSIS — D6869 Other thrombophilia: Secondary | ICD-10-CM | POA: Diagnosis not present

## 2022-06-04 DIAGNOSIS — R296 Repeated falls: Secondary | ICD-10-CM | POA: Diagnosis not present

## 2022-06-04 DIAGNOSIS — R2689 Other abnormalities of gait and mobility: Secondary | ICD-10-CM | POA: Diagnosis not present

## 2022-06-04 DIAGNOSIS — Z4789 Encounter for other orthopedic aftercare: Secondary | ICD-10-CM | POA: Diagnosis not present

## 2022-06-07 DIAGNOSIS — Z4789 Encounter for other orthopedic aftercare: Secondary | ICD-10-CM | POA: Diagnosis not present

## 2022-06-07 DIAGNOSIS — R296 Repeated falls: Secondary | ICD-10-CM | POA: Diagnosis not present

## 2022-06-07 DIAGNOSIS — R2689 Other abnormalities of gait and mobility: Secondary | ICD-10-CM | POA: Diagnosis not present

## 2022-06-09 DIAGNOSIS — R296 Repeated falls: Secondary | ICD-10-CM | POA: Diagnosis not present

## 2022-06-09 DIAGNOSIS — Z4789 Encounter for other orthopedic aftercare: Secondary | ICD-10-CM | POA: Diagnosis not present

## 2022-06-09 DIAGNOSIS — R2689 Other abnormalities of gait and mobility: Secondary | ICD-10-CM | POA: Diagnosis not present

## 2022-06-11 DIAGNOSIS — Z4789 Encounter for other orthopedic aftercare: Secondary | ICD-10-CM | POA: Diagnosis not present

## 2022-06-11 DIAGNOSIS — R296 Repeated falls: Secondary | ICD-10-CM | POA: Diagnosis not present

## 2022-06-11 DIAGNOSIS — R2689 Other abnormalities of gait and mobility: Secondary | ICD-10-CM | POA: Diagnosis not present

## 2022-06-15 DIAGNOSIS — E7800 Pure hypercholesterolemia, unspecified: Secondary | ICD-10-CM | POA: Diagnosis not present

## 2022-06-15 DIAGNOSIS — N400 Enlarged prostate without lower urinary tract symptoms: Secondary | ICD-10-CM | POA: Diagnosis not present

## 2022-06-15 DIAGNOSIS — I4891 Unspecified atrial fibrillation: Secondary | ICD-10-CM | POA: Diagnosis not present

## 2022-06-15 DIAGNOSIS — I270 Primary pulmonary hypertension: Secondary | ICD-10-CM | POA: Diagnosis not present

## 2022-06-15 DIAGNOSIS — I10 Essential (primary) hypertension: Secondary | ICD-10-CM | POA: Diagnosis not present

## 2022-06-16 DIAGNOSIS — R2689 Other abnormalities of gait and mobility: Secondary | ICD-10-CM | POA: Diagnosis not present

## 2022-06-16 DIAGNOSIS — R296 Repeated falls: Secondary | ICD-10-CM | POA: Diagnosis not present

## 2022-06-16 DIAGNOSIS — Z4789 Encounter for other orthopedic aftercare: Secondary | ICD-10-CM | POA: Diagnosis not present

## 2022-06-18 DIAGNOSIS — Z4789 Encounter for other orthopedic aftercare: Secondary | ICD-10-CM | POA: Diagnosis not present

## 2022-06-18 DIAGNOSIS — R2689 Other abnormalities of gait and mobility: Secondary | ICD-10-CM | POA: Diagnosis not present

## 2022-06-18 DIAGNOSIS — R296 Repeated falls: Secondary | ICD-10-CM | POA: Diagnosis not present

## 2022-06-23 DIAGNOSIS — R296 Repeated falls: Secondary | ICD-10-CM | POA: Diagnosis not present

## 2022-06-23 DIAGNOSIS — Z4789 Encounter for other orthopedic aftercare: Secondary | ICD-10-CM | POA: Diagnosis not present

## 2022-06-23 DIAGNOSIS — R2689 Other abnormalities of gait and mobility: Secondary | ICD-10-CM | POA: Diagnosis not present

## 2022-06-25 DIAGNOSIS — Z4789 Encounter for other orthopedic aftercare: Secondary | ICD-10-CM | POA: Diagnosis not present

## 2022-06-25 DIAGNOSIS — R296 Repeated falls: Secondary | ICD-10-CM | POA: Diagnosis not present

## 2022-06-25 DIAGNOSIS — R2689 Other abnormalities of gait and mobility: Secondary | ICD-10-CM | POA: Diagnosis not present

## 2022-06-30 DIAGNOSIS — D6869 Other thrombophilia: Secondary | ICD-10-CM | POA: Diagnosis not present

## 2022-06-30 DIAGNOSIS — I4891 Unspecified atrial fibrillation: Secondary | ICD-10-CM | POA: Diagnosis not present

## 2022-06-30 DIAGNOSIS — I10 Essential (primary) hypertension: Secondary | ICD-10-CM | POA: Diagnosis not present

## 2022-06-30 DIAGNOSIS — I700 Atherosclerosis of aorta: Secondary | ICD-10-CM | POA: Diagnosis not present

## 2022-06-30 DIAGNOSIS — R2689 Other abnormalities of gait and mobility: Secondary | ICD-10-CM | POA: Diagnosis not present

## 2022-06-30 DIAGNOSIS — R296 Repeated falls: Secondary | ICD-10-CM | POA: Diagnosis not present

## 2022-06-30 DIAGNOSIS — Z4789 Encounter for other orthopedic aftercare: Secondary | ICD-10-CM | POA: Diagnosis not present

## 2022-06-30 DIAGNOSIS — R140 Abdominal distension (gaseous): Secondary | ICD-10-CM | POA: Diagnosis not present

## 2022-07-02 DIAGNOSIS — Z4789 Encounter for other orthopedic aftercare: Secondary | ICD-10-CM | POA: Diagnosis not present

## 2022-07-02 DIAGNOSIS — R296 Repeated falls: Secondary | ICD-10-CM | POA: Diagnosis not present

## 2022-07-02 DIAGNOSIS — R2689 Other abnormalities of gait and mobility: Secondary | ICD-10-CM | POA: Diagnosis not present

## 2022-07-06 DIAGNOSIS — Z7901 Long term (current) use of anticoagulants: Secondary | ICD-10-CM | POA: Diagnosis not present

## 2022-07-07 DIAGNOSIS — Z4789 Encounter for other orthopedic aftercare: Secondary | ICD-10-CM | POA: Diagnosis not present

## 2022-07-07 DIAGNOSIS — R2689 Other abnormalities of gait and mobility: Secondary | ICD-10-CM | POA: Diagnosis not present

## 2022-07-07 DIAGNOSIS — R296 Repeated falls: Secondary | ICD-10-CM | POA: Diagnosis not present

## 2022-07-08 DIAGNOSIS — N401 Enlarged prostate with lower urinary tract symptoms: Secondary | ICD-10-CM | POA: Diagnosis not present

## 2022-07-08 DIAGNOSIS — R350 Frequency of micturition: Secondary | ICD-10-CM | POA: Diagnosis not present

## 2022-07-09 DIAGNOSIS — R2689 Other abnormalities of gait and mobility: Secondary | ICD-10-CM | POA: Diagnosis not present

## 2022-07-09 DIAGNOSIS — R296 Repeated falls: Secondary | ICD-10-CM | POA: Diagnosis not present

## 2022-07-09 DIAGNOSIS — Z4789 Encounter for other orthopedic aftercare: Secondary | ICD-10-CM | POA: Diagnosis not present

## 2022-07-14 DIAGNOSIS — R296 Repeated falls: Secondary | ICD-10-CM | POA: Diagnosis not present

## 2022-07-14 DIAGNOSIS — Z4789 Encounter for other orthopedic aftercare: Secondary | ICD-10-CM | POA: Diagnosis not present

## 2022-07-14 DIAGNOSIS — R2689 Other abnormalities of gait and mobility: Secondary | ICD-10-CM | POA: Diagnosis not present

## 2022-07-16 DIAGNOSIS — R2689 Other abnormalities of gait and mobility: Secondary | ICD-10-CM | POA: Diagnosis not present

## 2022-07-16 DIAGNOSIS — Z4789 Encounter for other orthopedic aftercare: Secondary | ICD-10-CM | POA: Diagnosis not present

## 2022-07-16 DIAGNOSIS — R296 Repeated falls: Secondary | ICD-10-CM | POA: Diagnosis not present

## 2022-07-27 DIAGNOSIS — D6869 Other thrombophilia: Secondary | ICD-10-CM | POA: Diagnosis not present

## 2022-07-27 DIAGNOSIS — I4891 Unspecified atrial fibrillation: Secondary | ICD-10-CM | POA: Diagnosis not present

## 2022-07-27 DIAGNOSIS — N400 Enlarged prostate without lower urinary tract symptoms: Secondary | ICD-10-CM | POA: Diagnosis not present

## 2022-07-27 DIAGNOSIS — I10 Essential (primary) hypertension: Secondary | ICD-10-CM | POA: Diagnosis not present

## 2022-07-28 DIAGNOSIS — R296 Repeated falls: Secondary | ICD-10-CM | POA: Diagnosis not present

## 2022-07-28 DIAGNOSIS — Z4789 Encounter for other orthopedic aftercare: Secondary | ICD-10-CM | POA: Diagnosis not present

## 2022-07-28 DIAGNOSIS — R2689 Other abnormalities of gait and mobility: Secondary | ICD-10-CM | POA: Diagnosis not present

## 2022-08-05 DIAGNOSIS — Z4789 Encounter for other orthopedic aftercare: Secondary | ICD-10-CM | POA: Diagnosis not present

## 2022-08-05 DIAGNOSIS — R296 Repeated falls: Secondary | ICD-10-CM | POA: Diagnosis not present

## 2022-08-05 DIAGNOSIS — R2689 Other abnormalities of gait and mobility: Secondary | ICD-10-CM | POA: Diagnosis not present

## 2022-08-06 DIAGNOSIS — N400 Enlarged prostate without lower urinary tract symptoms: Secondary | ICD-10-CM | POA: Diagnosis not present

## 2022-08-06 DIAGNOSIS — I10 Essential (primary) hypertension: Secondary | ICD-10-CM | POA: Diagnosis not present

## 2022-08-06 DIAGNOSIS — E7800 Pure hypercholesterolemia, unspecified: Secondary | ICD-10-CM | POA: Diagnosis not present

## 2022-08-18 DIAGNOSIS — R296 Repeated falls: Secondary | ICD-10-CM | POA: Diagnosis not present

## 2022-08-18 DIAGNOSIS — Z4789 Encounter for other orthopedic aftercare: Secondary | ICD-10-CM | POA: Diagnosis not present

## 2022-08-18 DIAGNOSIS — R2689 Other abnormalities of gait and mobility: Secondary | ICD-10-CM | POA: Diagnosis not present

## 2022-08-23 ENCOUNTER — Ambulatory Visit: Payer: Medicare Other | Admitting: Cardiology

## 2022-08-24 DIAGNOSIS — Z7901 Long term (current) use of anticoagulants: Secondary | ICD-10-CM | POA: Diagnosis not present

## 2022-08-25 DIAGNOSIS — R2689 Other abnormalities of gait and mobility: Secondary | ICD-10-CM | POA: Diagnosis not present

## 2022-08-25 DIAGNOSIS — Z4789 Encounter for other orthopedic aftercare: Secondary | ICD-10-CM | POA: Diagnosis not present

## 2022-08-25 DIAGNOSIS — R296 Repeated falls: Secondary | ICD-10-CM | POA: Diagnosis not present

## 2022-09-01 DIAGNOSIS — R296 Repeated falls: Secondary | ICD-10-CM | POA: Diagnosis not present

## 2022-09-01 DIAGNOSIS — Z4789 Encounter for other orthopedic aftercare: Secondary | ICD-10-CM | POA: Diagnosis not present

## 2022-09-01 DIAGNOSIS — R2689 Other abnormalities of gait and mobility: Secondary | ICD-10-CM | POA: Diagnosis not present

## 2022-09-02 DIAGNOSIS — Z7901 Long term (current) use of anticoagulants: Secondary | ICD-10-CM | POA: Diagnosis not present

## 2022-09-06 DIAGNOSIS — R2689 Other abnormalities of gait and mobility: Secondary | ICD-10-CM | POA: Diagnosis not present

## 2022-09-06 DIAGNOSIS — Z4789 Encounter for other orthopedic aftercare: Secondary | ICD-10-CM | POA: Diagnosis not present

## 2022-09-06 DIAGNOSIS — R296 Repeated falls: Secondary | ICD-10-CM | POA: Diagnosis not present

## 2022-09-08 DIAGNOSIS — R2689 Other abnormalities of gait and mobility: Secondary | ICD-10-CM | POA: Diagnosis not present

## 2022-09-08 DIAGNOSIS — Z4789 Encounter for other orthopedic aftercare: Secondary | ICD-10-CM | POA: Diagnosis not present

## 2022-09-08 DIAGNOSIS — R296 Repeated falls: Secondary | ICD-10-CM | POA: Diagnosis not present

## 2022-09-13 DIAGNOSIS — Z4789 Encounter for other orthopedic aftercare: Secondary | ICD-10-CM | POA: Diagnosis not present

## 2022-09-13 DIAGNOSIS — R296 Repeated falls: Secondary | ICD-10-CM | POA: Diagnosis not present

## 2022-09-13 DIAGNOSIS — R2689 Other abnormalities of gait and mobility: Secondary | ICD-10-CM | POA: Diagnosis not present

## 2022-09-15 DIAGNOSIS — R2689 Other abnormalities of gait and mobility: Secondary | ICD-10-CM | POA: Diagnosis not present

## 2022-09-15 DIAGNOSIS — Z4789 Encounter for other orthopedic aftercare: Secondary | ICD-10-CM | POA: Diagnosis not present

## 2022-09-15 DIAGNOSIS — R296 Repeated falls: Secondary | ICD-10-CM | POA: Diagnosis not present

## 2022-09-15 DIAGNOSIS — D6869 Other thrombophilia: Secondary | ICD-10-CM | POA: Insufficient documentation

## 2022-09-15 NOTE — Progress Notes (Unsigned)
  Cardiology Office Note:   Date:  09/16/2022  ID:  Adam Richards, DOB 07-20-1926, MRN [id number]  History of Present Illness:   Adam Richards is a 89 y.o. male  who presents for evaluation of atrial fibrillation. He moved here from [place]. He has a long-standing history of permanent atrial fibrillation. He's been on anticoagulation for years. He had a mildly decreased LV function and moderately increased pulmonary pressures.  I did repeat an echocardiogram in 2022 which suggested that his EF was low normal 50 to 55%.  He had moderately severe tricuspid regurgitation with moderately elevated pulmonary pressures.      Since I last saw him  He has had some increased shortness of breath walking at times from the car into his daughter's house.  He feels like sometimes he cannot get a deep breath.  He is not describing PND or orthopnea.  He had no new palpitations though he notices his heart rate to be in the 90s sometimes.  He is not having any presyncope or syncope.  He walks with a walker.  He lives at [place] in independent living.  He has not had any new chest pressure, neck or arm discomfort.  ROS: As stated in the HPI and negative for all other systems.  Studies Reviewed:    EKG: Atrial fibrillation, rate 90s, low voltage in the limb leads, no acute ST-T wave changes.  No significant change from previous.   Risk Assessment/Calculations:    CHA2DS2-VASc Score = 3   This indicates a 3.2% annual risk of stroke. The patient's score is based upon: CHF History: 0 HTN History: 1 Diabetes History: 0 Stroke History: 0 Vascular Disease History: 0 Age Score: 2 Gender Score: 0   Physical Exam:   VS:  BP 128/72 (BP Location: Left Arm, Patient Position: Sitting, Cuff Size: Normal)   Pulse 99   Ht  (1.778 m)   Wt 188 lb 9.6 oz (85.5 kg)   SpO2 98%   BMI 27.06 kg/m    Wt Readings from Last 3 Encounters:  09/16/22 188 lb 9.6 oz (85.5 kg)  12/14/21 186 lb 4.6 oz (84.5 kg)  08/25/21  187 lb (84.8 kg)     GEN: Well nourished, well developed in no acute distress NECK: Positive JVD; No carotid bruits CARDIAC: Irregular RR, no murmurs, rubs, gallops RESPIRATORY:  Clear to auscultation without rales, wheezing or rhonchi  ABDOMEN: Soft, non-tender, non-distended EXTREMITIES: Moderate bilateral lower extremity edema; No deformity   ASSESSMENT AND PLAN:   ATRIAL FIB:  The patient has chronic atrial fibrillation. [name] has a CHA2DS2 - VASc score of 3 .  He would like to switch from warfarin to Eliquis and we will make this transition.   HTN: Blood pressure is at target.  No change in therapy.  PULMONARY HTN:     We are managing this conservatively.  I did walk him around the office because he was having some shortness of breath with exertion and walking very quickly around the office and his saturations stayed in the mid 90s.  His heart rate was okay.  They will keep an eye on this at home.  At this point he is not having any resting complaints.  No further workup.         Signed, Rollene Rotunda, MD

## 2022-09-16 ENCOUNTER — Encounter: Payer: Self-pay | Admitting: Cardiology

## 2022-09-16 ENCOUNTER — Telehealth: Payer: Self-pay | Admitting: Pharmacist

## 2022-09-16 ENCOUNTER — Ambulatory Visit: Payer: Medicare Other | Attending: Cardiology | Admitting: Cardiology

## 2022-09-16 VITALS — BP 128/72 | HR 99 | Ht 70.0 in | Wt 188.6 lb

## 2022-09-16 DIAGNOSIS — I10 Essential (primary) hypertension: Secondary | ICD-10-CM

## 2022-09-16 DIAGNOSIS — D6869 Other thrombophilia: Secondary | ICD-10-CM | POA: Diagnosis not present

## 2022-09-16 DIAGNOSIS — I482 Chronic atrial fibrillation, unspecified: Secondary | ICD-10-CM | POA: Diagnosis not present

## 2022-09-16 MED ORDER — APIXABAN 5 MG PO TABS: 5.0000 mg | ORAL_TABLET | Freq: Two times a day (BID) | ORAL | 1 refills | Status: AC

## 2022-09-16 NOTE — Telephone Encounter (Signed)
Per Dr Antoine Poche, patient interested in switching to Eliquis. Discussed pros and cons of Eliquis compared to warfarin. Last INR was 1.9. Patient has medications delivered from [facility]. Advised to continue warfarin until he receives his Eliquis delivery. Once he receives it, hold warfarin for 2 days then start Eliquis  BID. Advised to watch for signs/symptoms of bleeding. Advised of no need for INR checking and no dietary restrictions.

## 2022-09-16 NOTE — Patient Instructions (Signed)
Medication Instructions:  Your physician has recommended you make the following change in your medication:  STOP: Warfarin  START:: Eliquis 5 mg twice daily  *If you need a refill on your cardiac medications before your next appointment, please call your pharmacy*   Lab Work: NONE ordered at this time of appointment   If you have labs (blood work) drawn today and your tests are completely normal, you will receive your results only by: MyChart Message (if you have MyChart) OR A paper copy in the mail If you have any lab test that is abnormal or we need to change your treatment, we will call you to review the results.   Testing/Procedures: NONE ordered at this time of appointment     Follow-Up: At [facility], you and your health needs are our priority.  As part of our continuing mission to provide you with exceptional heart care, we have created designated Provider Care Teams.  These Care Teams include your primary Cardiologist (physician) and Advanced Practice Providers (APPs -  Physician Assistants and Nurse Practitioners) who all work together to provide you with the care you need, when you need it.  We recommend signing up for the patient portal called "MyChart".  Sign up information is provided on this After Visit Summary.  MyChart is used to connect with patients for Virtual Visits (Telemedicine).  Patients are able to view lab/test results, encounter notes, upcoming appointments, etc.  Non-urgent messages can be sent to your provider as well.   To learn more about what you can do with MyChart, go to [email or web address].    Your next appointment:   1 year(s)  Provider:   Rollene Rotunda, MD

## 2022-09-20 DIAGNOSIS — R296 Repeated falls: Secondary | ICD-10-CM | POA: Diagnosis not present

## 2022-09-20 DIAGNOSIS — Z4789 Encounter for other orthopedic aftercare: Secondary | ICD-10-CM | POA: Diagnosis not present

## 2022-09-20 DIAGNOSIS — R2689 Other abnormalities of gait and mobility: Secondary | ICD-10-CM | POA: Diagnosis not present

## 2022-09-21 DIAGNOSIS — Z0000 Encounter for general adult medical examination without abnormal findings: Secondary | ICD-10-CM | POA: Diagnosis not present

## 2022-09-21 DIAGNOSIS — I10 Essential (primary) hypertension: Secondary | ICD-10-CM | POA: Diagnosis not present

## 2022-09-21 DIAGNOSIS — N400 Enlarged prostate without lower urinary tract symptoms: Secondary | ICD-10-CM | POA: Diagnosis not present

## 2022-09-21 DIAGNOSIS — I4891 Unspecified atrial fibrillation: Secondary | ICD-10-CM | POA: Diagnosis not present

## 2022-09-21 DIAGNOSIS — I700 Atherosclerosis of aorta: Secondary | ICD-10-CM | POA: Diagnosis not present

## 2022-09-21 DIAGNOSIS — Z1331 Encounter for screening for depression: Secondary | ICD-10-CM | POA: Diagnosis not present

## 2022-09-21 DIAGNOSIS — R600 Localized edema: Secondary | ICD-10-CM | POA: Diagnosis not present

## 2022-09-21 DIAGNOSIS — N281 Cyst of kidney, acquired: Secondary | ICD-10-CM | POA: Diagnosis not present

## 2022-09-21 DIAGNOSIS — D6869 Other thrombophilia: Secondary | ICD-10-CM | POA: Diagnosis not present

## 2022-09-21 DIAGNOSIS — I270 Primary pulmonary hypertension: Secondary | ICD-10-CM | POA: Diagnosis not present

## 2022-09-21 DIAGNOSIS — E7800 Pure hypercholesterolemia, unspecified: Secondary | ICD-10-CM | POA: Diagnosis not present

## 2022-09-22 DIAGNOSIS — R2689 Other abnormalities of gait and mobility: Secondary | ICD-10-CM | POA: Diagnosis not present

## 2022-09-22 DIAGNOSIS — R296 Repeated falls: Secondary | ICD-10-CM | POA: Diagnosis not present

## 2022-09-22 DIAGNOSIS — Z4789 Encounter for other orthopedic aftercare: Secondary | ICD-10-CM | POA: Diagnosis not present

## 2022-09-23 DIAGNOSIS — Z872 Personal history of diseases of the skin and subcutaneous tissue: Secondary | ICD-10-CM | POA: Diagnosis not present

## 2022-09-23 DIAGNOSIS — L814 Other melanin hyperpigmentation: Secondary | ICD-10-CM | POA: Diagnosis not present

## 2022-09-23 DIAGNOSIS — D225 Melanocytic nevi of trunk: Secondary | ICD-10-CM | POA: Diagnosis not present

## 2022-09-23 DIAGNOSIS — L821 Other seborrheic keratosis: Secondary | ICD-10-CM | POA: Diagnosis not present

## 2022-09-23 DIAGNOSIS — L570 Actinic keratosis: Secondary | ICD-10-CM | POA: Diagnosis not present

## 2022-09-23 DIAGNOSIS — Z08 Encounter for follow-up examination after completed treatment for malignant neoplasm: Secondary | ICD-10-CM | POA: Diagnosis not present

## 2022-09-23 DIAGNOSIS — Z85828 Personal history of other malignant neoplasm of skin: Secondary | ICD-10-CM | POA: Diagnosis not present

## 2022-09-28 DIAGNOSIS — R2689 Other abnormalities of gait and mobility: Secondary | ICD-10-CM | POA: Diagnosis not present

## 2022-09-28 DIAGNOSIS — R296 Repeated falls: Secondary | ICD-10-CM | POA: Diagnosis not present

## 2022-09-28 DIAGNOSIS — Z4789 Encounter for other orthopedic aftercare: Secondary | ICD-10-CM | POA: Diagnosis not present

## 2022-10-05 DIAGNOSIS — Z4789 Encounter for other orthopedic aftercare: Secondary | ICD-10-CM | POA: Diagnosis not present

## 2022-10-05 DIAGNOSIS — R2689 Other abnormalities of gait and mobility: Secondary | ICD-10-CM | POA: Diagnosis not present

## 2022-10-05 DIAGNOSIS — R296 Repeated falls: Secondary | ICD-10-CM | POA: Diagnosis not present

## 2022-10-07 DIAGNOSIS — R296 Repeated falls: Secondary | ICD-10-CM | POA: Diagnosis not present

## 2022-10-07 DIAGNOSIS — Z4789 Encounter for other orthopedic aftercare: Secondary | ICD-10-CM | POA: Diagnosis not present

## 2022-10-07 DIAGNOSIS — R2689 Other abnormalities of gait and mobility: Secondary | ICD-10-CM | POA: Diagnosis not present

## 2022-10-12 DIAGNOSIS — R296 Repeated falls: Secondary | ICD-10-CM | POA: Diagnosis not present

## 2022-10-12 DIAGNOSIS — Z4789 Encounter for other orthopedic aftercare: Secondary | ICD-10-CM | POA: Diagnosis not present

## 2022-10-12 DIAGNOSIS — R2689 Other abnormalities of gait and mobility: Secondary | ICD-10-CM | POA: Diagnosis not present

## 2022-10-13 DIAGNOSIS — M2041 Other hammer toe(s) (acquired), right foot: Secondary | ICD-10-CM | POA: Diagnosis not present

## 2022-10-13 DIAGNOSIS — E7800 Pure hypercholesterolemia, unspecified: Secondary | ICD-10-CM | POA: Diagnosis not present

## 2022-10-13 DIAGNOSIS — M2042 Other hammer toe(s) (acquired), left foot: Secondary | ICD-10-CM | POA: Diagnosis not present

## 2022-10-13 DIAGNOSIS — B351 Tinea unguium: Secondary | ICD-10-CM | POA: Diagnosis not present

## 2022-10-13 DIAGNOSIS — M79675 Pain in left toe(s): Secondary | ICD-10-CM | POA: Diagnosis not present

## 2022-10-13 DIAGNOSIS — I10 Essential (primary) hypertension: Secondary | ICD-10-CM | POA: Diagnosis not present

## 2022-10-13 DIAGNOSIS — Z7901 Long term (current) use of anticoagulants: Secondary | ICD-10-CM | POA: Diagnosis not present

## 2022-10-13 DIAGNOSIS — N400 Enlarged prostate without lower urinary tract symptoms: Secondary | ICD-10-CM | POA: Diagnosis not present

## 2022-10-14 DIAGNOSIS — R296 Repeated falls: Secondary | ICD-10-CM | POA: Diagnosis not present

## 2022-10-14 DIAGNOSIS — Z4789 Encounter for other orthopedic aftercare: Secondary | ICD-10-CM | POA: Diagnosis not present

## 2022-10-14 DIAGNOSIS — R2689 Other abnormalities of gait and mobility: Secondary | ICD-10-CM | POA: Diagnosis not present

## 2022-10-19 DIAGNOSIS — Z4789 Encounter for other orthopedic aftercare: Secondary | ICD-10-CM | POA: Diagnosis not present

## 2022-10-19 DIAGNOSIS — R2689 Other abnormalities of gait and mobility: Secondary | ICD-10-CM | POA: Diagnosis not present

## 2022-10-19 DIAGNOSIS — R296 Repeated falls: Secondary | ICD-10-CM | POA: Diagnosis not present

## 2022-10-26 DIAGNOSIS — R2689 Other abnormalities of gait and mobility: Secondary | ICD-10-CM | POA: Diagnosis not present

## 2022-10-26 DIAGNOSIS — R296 Repeated falls: Secondary | ICD-10-CM | POA: Diagnosis not present

## 2022-10-26 DIAGNOSIS — Z4789 Encounter for other orthopedic aftercare: Secondary | ICD-10-CM | POA: Diagnosis not present

## 2022-11-02 DIAGNOSIS — R2689 Other abnormalities of gait and mobility: Secondary | ICD-10-CM | POA: Diagnosis not present

## 2022-11-02 DIAGNOSIS — R296 Repeated falls: Secondary | ICD-10-CM | POA: Diagnosis not present

## 2022-11-02 DIAGNOSIS — Z4789 Encounter for other orthopedic aftercare: Secondary | ICD-10-CM | POA: Diagnosis not present

## 2022-11-08 DIAGNOSIS — R296 Repeated falls: Secondary | ICD-10-CM | POA: Diagnosis not present

## 2022-11-08 DIAGNOSIS — R2689 Other abnormalities of gait and mobility: Secondary | ICD-10-CM | POA: Diagnosis not present

## 2022-11-08 DIAGNOSIS — Z4789 Encounter for other orthopedic aftercare: Secondary | ICD-10-CM | POA: Diagnosis not present

## 2022-11-11 DIAGNOSIS — I10 Essential (primary) hypertension: Secondary | ICD-10-CM | POA: Diagnosis not present

## 2022-11-11 DIAGNOSIS — N400 Enlarged prostate without lower urinary tract symptoms: Secondary | ICD-10-CM | POA: Diagnosis not present

## 2022-11-11 DIAGNOSIS — E7800 Pure hypercholesterolemia, unspecified: Secondary | ICD-10-CM | POA: Diagnosis not present

## 2022-11-11 DIAGNOSIS — I4891 Unspecified atrial fibrillation: Secondary | ICD-10-CM | POA: Diagnosis not present

## 2022-11-17 DIAGNOSIS — Z4789 Encounter for other orthopedic aftercare: Secondary | ICD-10-CM | POA: Diagnosis not present

## 2022-11-17 DIAGNOSIS — R2689 Other abnormalities of gait and mobility: Secondary | ICD-10-CM | POA: Diagnosis not present

## 2022-11-17 DIAGNOSIS — R296 Repeated falls: Secondary | ICD-10-CM | POA: Diagnosis not present

## 2022-11-23 DIAGNOSIS — R2689 Other abnormalities of gait and mobility: Secondary | ICD-10-CM | POA: Diagnosis not present

## 2022-11-23 DIAGNOSIS — R296 Repeated falls: Secondary | ICD-10-CM | POA: Diagnosis not present

## 2022-11-23 DIAGNOSIS — Z4789 Encounter for other orthopedic aftercare: Secondary | ICD-10-CM | POA: Diagnosis not present

## 2022-12-06 DIAGNOSIS — R296 Repeated falls: Secondary | ICD-10-CM | POA: Diagnosis not present

## 2022-12-06 DIAGNOSIS — R2689 Other abnormalities of gait and mobility: Secondary | ICD-10-CM | POA: Diagnosis not present

## 2022-12-06 DIAGNOSIS — Z4789 Encounter for other orthopedic aftercare: Secondary | ICD-10-CM | POA: Diagnosis not present

## 2022-12-08 DIAGNOSIS — H1789 Other corneal scars and opacities: Secondary | ICD-10-CM | POA: Diagnosis not present

## 2022-12-08 DIAGNOSIS — H5203 Hypermetropia, bilateral: Secondary | ICD-10-CM | POA: Diagnosis not present

## 2022-12-08 DIAGNOSIS — H18593 Other hereditary corneal dystrophies, bilateral: Secondary | ICD-10-CM | POA: Diagnosis not present

## 2022-12-09 DIAGNOSIS — I4891 Unspecified atrial fibrillation: Secondary | ICD-10-CM | POA: Diagnosis not present

## 2022-12-09 DIAGNOSIS — E7800 Pure hypercholesterolemia, unspecified: Secondary | ICD-10-CM | POA: Diagnosis not present

## 2022-12-09 DIAGNOSIS — N400 Enlarged prostate without lower urinary tract symptoms: Secondary | ICD-10-CM | POA: Diagnosis not present

## 2022-12-09 DIAGNOSIS — I10 Essential (primary) hypertension: Secondary | ICD-10-CM | POA: Diagnosis not present

## 2022-12-17 DIAGNOSIS — Z4789 Encounter for other orthopedic aftercare: Secondary | ICD-10-CM | POA: Diagnosis not present

## 2022-12-17 DIAGNOSIS — R2689 Other abnormalities of gait and mobility: Secondary | ICD-10-CM | POA: Diagnosis not present

## 2022-12-17 DIAGNOSIS — R296 Repeated falls: Secondary | ICD-10-CM | POA: Diagnosis not present

## 2022-12-20 DIAGNOSIS — R2689 Other abnormalities of gait and mobility: Secondary | ICD-10-CM | POA: Diagnosis not present

## 2022-12-20 DIAGNOSIS — R296 Repeated falls: Secondary | ICD-10-CM | POA: Diagnosis not present

## 2022-12-20 DIAGNOSIS — Z4789 Encounter for other orthopedic aftercare: Secondary | ICD-10-CM | POA: Diagnosis not present

## 2022-12-27 DIAGNOSIS — R2689 Other abnormalities of gait and mobility: Secondary | ICD-10-CM | POA: Diagnosis not present

## 2022-12-27 DIAGNOSIS — Z4789 Encounter for other orthopedic aftercare: Secondary | ICD-10-CM | POA: Diagnosis not present

## 2022-12-27 DIAGNOSIS — R296 Repeated falls: Secondary | ICD-10-CM | POA: Diagnosis not present

## 2023-01-03 DIAGNOSIS — R2689 Other abnormalities of gait and mobility: Secondary | ICD-10-CM | POA: Diagnosis not present

## 2023-01-03 DIAGNOSIS — R296 Repeated falls: Secondary | ICD-10-CM | POA: Diagnosis not present

## 2023-01-03 DIAGNOSIS — Z4789 Encounter for other orthopedic aftercare: Secondary | ICD-10-CM | POA: Diagnosis not present

## 2023-01-10 DIAGNOSIS — R2689 Other abnormalities of gait and mobility: Secondary | ICD-10-CM | POA: Diagnosis not present

## 2023-01-10 DIAGNOSIS — R296 Repeated falls: Secondary | ICD-10-CM | POA: Diagnosis not present

## 2023-01-10 DIAGNOSIS — Z4789 Encounter for other orthopedic aftercare: Secondary | ICD-10-CM | POA: Diagnosis not present

## 2023-01-18 DIAGNOSIS — R2689 Other abnormalities of gait and mobility: Secondary | ICD-10-CM | POA: Diagnosis not present

## 2023-01-18 DIAGNOSIS — R296 Repeated falls: Secondary | ICD-10-CM | POA: Diagnosis not present

## 2023-01-18 DIAGNOSIS — Z4789 Encounter for other orthopedic aftercare: Secondary | ICD-10-CM | POA: Diagnosis not present

## 2023-01-25 DIAGNOSIS — C4442 Squamous cell carcinoma of skin of scalp and neck: Secondary | ICD-10-CM | POA: Diagnosis not present

## 2023-01-25 DIAGNOSIS — C44229 Squamous cell carcinoma of skin of left ear and external auricular canal: Secondary | ICD-10-CM | POA: Diagnosis not present

## 2023-01-25 DIAGNOSIS — L570 Actinic keratosis: Secondary | ICD-10-CM | POA: Diagnosis not present

## 2023-01-25 DIAGNOSIS — Z08 Encounter for follow-up examination after completed treatment for malignant neoplasm: Secondary | ICD-10-CM | POA: Diagnosis not present

## 2023-01-25 DIAGNOSIS — D492 Neoplasm of unspecified behavior of bone, soft tissue, and skin: Secondary | ICD-10-CM | POA: Diagnosis not present

## 2023-01-25 DIAGNOSIS — Z872 Personal history of diseases of the skin and subcutaneous tissue: Secondary | ICD-10-CM | POA: Diagnosis not present

## 2023-01-25 DIAGNOSIS — C44622 Squamous cell carcinoma of skin of right upper limb, including shoulder: Secondary | ICD-10-CM | POA: Diagnosis not present

## 2023-01-25 DIAGNOSIS — Z85828 Personal history of other malignant neoplasm of skin: Secondary | ICD-10-CM | POA: Diagnosis not present

## 2023-01-28 DIAGNOSIS — Z4789 Encounter for other orthopedic aftercare: Secondary | ICD-10-CM | POA: Diagnosis not present

## 2023-01-28 DIAGNOSIS — R2689 Other abnormalities of gait and mobility: Secondary | ICD-10-CM | POA: Diagnosis not present

## 2023-01-28 DIAGNOSIS — R296 Repeated falls: Secondary | ICD-10-CM | POA: Diagnosis not present

## 2023-01-31 DIAGNOSIS — R296 Repeated falls: Secondary | ICD-10-CM | POA: Diagnosis not present

## 2023-01-31 DIAGNOSIS — Z4789 Encounter for other orthopedic aftercare: Secondary | ICD-10-CM | POA: Diagnosis not present

## 2023-01-31 DIAGNOSIS — R2689 Other abnormalities of gait and mobility: Secondary | ICD-10-CM | POA: Diagnosis not present

## 2023-02-08 DIAGNOSIS — R2689 Other abnormalities of gait and mobility: Secondary | ICD-10-CM | POA: Diagnosis not present

## 2023-02-08 DIAGNOSIS — R296 Repeated falls: Secondary | ICD-10-CM | POA: Diagnosis not present

## 2023-02-08 DIAGNOSIS — Z4789 Encounter for other orthopedic aftercare: Secondary | ICD-10-CM | POA: Diagnosis not present

## 2023-02-16 DIAGNOSIS — Z4789 Encounter for other orthopedic aftercare: Secondary | ICD-10-CM | POA: Diagnosis not present

## 2023-02-16 DIAGNOSIS — Z23 Encounter for immunization: Secondary | ICD-10-CM | POA: Diagnosis not present

## 2023-02-16 DIAGNOSIS — R296 Repeated falls: Secondary | ICD-10-CM | POA: Diagnosis not present

## 2023-02-16 DIAGNOSIS — R2689 Other abnormalities of gait and mobility: Secondary | ICD-10-CM | POA: Diagnosis not present

## 2023-02-23 DIAGNOSIS — R2689 Other abnormalities of gait and mobility: Secondary | ICD-10-CM | POA: Diagnosis not present

## 2023-02-23 DIAGNOSIS — Z4789 Encounter for other orthopedic aftercare: Secondary | ICD-10-CM | POA: Diagnosis not present

## 2023-02-23 DIAGNOSIS — R296 Repeated falls: Secondary | ICD-10-CM | POA: Diagnosis not present

## 2023-02-28 DIAGNOSIS — D0461 Carcinoma in situ of skin of right upper limb, including shoulder: Secondary | ICD-10-CM | POA: Diagnosis not present

## 2023-02-28 DIAGNOSIS — D044 Carcinoma in situ of skin of scalp and neck: Secondary | ICD-10-CM | POA: Diagnosis not present

## 2023-03-09 DIAGNOSIS — R296 Repeated falls: Secondary | ICD-10-CM | POA: Diagnosis not present

## 2023-03-09 DIAGNOSIS — R2689 Other abnormalities of gait and mobility: Secondary | ICD-10-CM | POA: Diagnosis not present

## 2023-03-09 DIAGNOSIS — Z4789 Encounter for other orthopedic aftercare: Secondary | ICD-10-CM | POA: Diagnosis not present

## 2023-03-16 DIAGNOSIS — R2689 Other abnormalities of gait and mobility: Secondary | ICD-10-CM | POA: Diagnosis not present

## 2023-03-16 DIAGNOSIS — R296 Repeated falls: Secondary | ICD-10-CM | POA: Diagnosis not present

## 2023-03-16 DIAGNOSIS — Z4789 Encounter for other orthopedic aftercare: Secondary | ICD-10-CM | POA: Diagnosis not present

## 2023-03-21 DIAGNOSIS — D0461 Carcinoma in situ of skin of right upper limb, including shoulder: Secondary | ICD-10-CM | POA: Diagnosis not present

## 2023-03-21 DIAGNOSIS — D044 Carcinoma in situ of skin of scalp and neck: Secondary | ICD-10-CM | POA: Diagnosis not present

## 2023-03-23 DIAGNOSIS — M79672 Pain in left foot: Secondary | ICD-10-CM | POA: Diagnosis not present

## 2023-03-23 DIAGNOSIS — I10 Essential (primary) hypertension: Secondary | ICD-10-CM | POA: Diagnosis not present

## 2023-03-23 DIAGNOSIS — R2689 Other abnormalities of gait and mobility: Secondary | ICD-10-CM | POA: Diagnosis not present

## 2023-03-23 DIAGNOSIS — Z4789 Encounter for other orthopedic aftercare: Secondary | ICD-10-CM | POA: Diagnosis not present

## 2023-03-23 DIAGNOSIS — D6869 Other thrombophilia: Secondary | ICD-10-CM | POA: Diagnosis not present

## 2023-03-23 DIAGNOSIS — I4891 Unspecified atrial fibrillation: Secondary | ICD-10-CM | POA: Diagnosis not present

## 2023-03-23 DIAGNOSIS — R296 Repeated falls: Secondary | ICD-10-CM | POA: Diagnosis not present

## 2023-03-23 DIAGNOSIS — I270 Primary pulmonary hypertension: Secondary | ICD-10-CM | POA: Diagnosis not present

## 2023-03-23 DIAGNOSIS — N281 Cyst of kidney, acquired: Secondary | ICD-10-CM | POA: Diagnosis not present

## 2023-03-23 DIAGNOSIS — E7800 Pure hypercholesterolemia, unspecified: Secondary | ICD-10-CM | POA: Diagnosis not present

## 2023-03-23 DIAGNOSIS — I700 Atherosclerosis of aorta: Secondary | ICD-10-CM | POA: Diagnosis not present

## 2023-03-23 DIAGNOSIS — N182 Chronic kidney disease, stage 2 (mild): Secondary | ICD-10-CM | POA: Diagnosis not present

## 2023-04-06 DIAGNOSIS — Z48817 Encounter for surgical aftercare following surgery on the skin and subcutaneous tissue: Secondary | ICD-10-CM | POA: Diagnosis not present

## 2023-04-07 ENCOUNTER — Encounter: Payer: Self-pay | Admitting: Podiatry

## 2023-04-07 ENCOUNTER — Ambulatory Visit (INDEPENDENT_AMBULATORY_CARE_PROVIDER_SITE_OTHER): Payer: Medicare Other

## 2023-04-07 ENCOUNTER — Ambulatory Visit: Payer: Medicare Other | Admitting: Podiatry

## 2023-04-07 DIAGNOSIS — M25872 Other specified joint disorders, left ankle and foot: Secondary | ICD-10-CM | POA: Diagnosis not present

## 2023-04-07 DIAGNOSIS — M778 Other enthesopathies, not elsewhere classified: Secondary | ICD-10-CM | POA: Diagnosis not present

## 2023-04-07 DIAGNOSIS — L600 Ingrowing nail: Secondary | ICD-10-CM | POA: Diagnosis not present

## 2023-04-07 NOTE — Progress Notes (Signed)
  Subjective:  Patient ID: Adam Richards, male    DOB: 14-Oct-1926,  MRN: [id number]  Chief Complaint  Patient presents with   Foot Pain    Left foot pain patient states may have deformity pain for about 6 months    Discussed the use of AI scribe software for clinical note transcription with the patient, who gave verbal consent to proceed.  History of Present Illness   The patient presents with foot pain, localized to the sole of the foot, specifically under the big toe joint. He denies recent injury but has a history of a broken kneecap and hip nearly two years ago. The pain is not severe enough to warrant medication. He also reports an ingrown toenail causing discomfort when touched.          Objective:    Physical Exam   MUSCULOSKELETAL: Left foot exhibits significant pes cavus deformity, hammertoe contractures, and hallux malleus deformity with thinning of the metatarsal fat pad bilaterally. Pain upon elevation to the fibular sesamoid.   SKIN: Dystrophic ingrown toenail with a medial border without signs of infection observed.       No images are attached to the encounter.    Results   Procedure: Debridement of ingrown toenail Description: The ingrown border was debrided in a slantback fashion to alleviate pain and pressure with a sharp nail nipper.  RADIOLOGY Foot X-ray: No acute fracture, no dislocation, diffuse osteopenia, scattered degenerative changes (04/07/2023)      Assessment:   1. Sesamoiditis of left foot   2. Ingrowing left great toenail      Plan:  Patient was evaluated and treated and all questions answered.  Assessment and Plan    Sesamoiditis   He presents with pain localized to the fibular sesamoid of the left foot, with an X-ray showing no acute fracture or dislocation. Notable findings include pes cavus deformity, hammertoe contractures, and hallux malleus deformity with bilateral thinning of the metatarsal fat pad. We placed a dancer's pad in his  insole to offload pressure and advised using extra strength Tylenol as needed for pain. We also discussed the potential benefit of custom molded foot orthoses for future relief.  Ingrown Toenail   He has a dystrophic ingrown toenail on the medial border without signs of infection. We trimmed and debrided the ingrown border in a slantback fashion to alleviate pain and pressure. Follow-up as needed.          Return if symptoms worsen or fail to improve.

## 2023-04-07 NOTE — Patient Instructions (Signed)
VISIT SUMMARY:  Today, we addressed your foot pain and ingrown toenail. We discussed the causes of your symptoms and provided treatments to help alleviate your discomfort.  YOUR PLAN:  -SESAMOIDITIS: Sesamoiditis is inflammation of the small bones (sesamoids) under the big toe joint. This can cause pain and discomfort, especially when walking. We placed a dancer's pad in your insole to reduce pressure on the affected area and recommended using extra strength Tylenol as needed for pain. We also discussed the potential benefit of custom molded foot orthoses for future relief.  -INGROWN TOENAIL: An ingrown toenail occurs when the edge of the toenail grows into the skin, causing pain and sometimes infection. We trimmed and debrided the ingrown border to alleviate pain and pressure. Please follow up as needed if the discomfort persists or worsens.  INSTRUCTIONS:  Please use the dancer's pad in your insole and take extra strength Tylenol as needed for pain relief. If your symptoms do not improve or if you experience increased pain, please schedule a follow-up appointment. Additionally, if the ingrown toenail continues to cause discomfort, follow up as needed.

## 2023-04-25 DIAGNOSIS — R296 Repeated falls: Secondary | ICD-10-CM | POA: Diagnosis not present

## 2023-04-25 DIAGNOSIS — R2689 Other abnormalities of gait and mobility: Secondary | ICD-10-CM | POA: Diagnosis not present

## 2023-04-25 DIAGNOSIS — Z4789 Encounter for other orthopedic aftercare: Secondary | ICD-10-CM | POA: Diagnosis not present

## 2023-05-05 DIAGNOSIS — R296 Repeated falls: Secondary | ICD-10-CM | POA: Diagnosis not present

## 2023-05-05 DIAGNOSIS — R2689 Other abnormalities of gait and mobility: Secondary | ICD-10-CM | POA: Diagnosis not present

## 2023-05-05 DIAGNOSIS — Z4789 Encounter for other orthopedic aftercare: Secondary | ICD-10-CM | POA: Diagnosis not present

## 2023-05-11 DIAGNOSIS — Z48817 Encounter for surgical aftercare following surgery on the skin and subcutaneous tissue: Secondary | ICD-10-CM | POA: Diagnosis not present

## 2023-05-12 DIAGNOSIS — R2689 Other abnormalities of gait and mobility: Secondary | ICD-10-CM | POA: Diagnosis not present

## 2023-05-12 DIAGNOSIS — Z4789 Encounter for other orthopedic aftercare: Secondary | ICD-10-CM | POA: Diagnosis not present

## 2023-05-12 DIAGNOSIS — R296 Repeated falls: Secondary | ICD-10-CM | POA: Diagnosis not present

## 2023-05-24 DIAGNOSIS — Z4789 Encounter for other orthopedic aftercare: Secondary | ICD-10-CM | POA: Diagnosis not present

## 2023-05-24 DIAGNOSIS — R296 Repeated falls: Secondary | ICD-10-CM | POA: Diagnosis not present

## 2023-05-24 DIAGNOSIS — R2689 Other abnormalities of gait and mobility: Secondary | ICD-10-CM | POA: Diagnosis not present

## 2023-05-31 DIAGNOSIS — R296 Repeated falls: Secondary | ICD-10-CM | POA: Diagnosis not present

## 2023-05-31 DIAGNOSIS — Z4789 Encounter for other orthopedic aftercare: Secondary | ICD-10-CM | POA: Diagnosis not present

## 2023-05-31 DIAGNOSIS — R2689 Other abnormalities of gait and mobility: Secondary | ICD-10-CM | POA: Diagnosis not present

## 2023-06-08 DIAGNOSIS — R296 Repeated falls: Secondary | ICD-10-CM | POA: Diagnosis not present

## 2023-06-08 DIAGNOSIS — R2689 Other abnormalities of gait and mobility: Secondary | ICD-10-CM | POA: Diagnosis not present

## 2023-06-08 DIAGNOSIS — Z4789 Encounter for other orthopedic aftercare: Secondary | ICD-10-CM | POA: Diagnosis not present

## 2023-07-07 DIAGNOSIS — B349 Viral infection, unspecified: Secondary | ICD-10-CM | POA: Diagnosis not present

## 2023-07-07 DIAGNOSIS — R6883 Chills (without fever): Secondary | ICD-10-CM | POA: Diagnosis not present

## 2023-07-07 DIAGNOSIS — R52 Pain, unspecified: Secondary | ICD-10-CM | POA: Diagnosis not present

## 2023-07-07 DIAGNOSIS — R051 Acute cough: Secondary | ICD-10-CM | POA: Diagnosis not present

## 2023-08-17 DIAGNOSIS — L821 Other seborrheic keratosis: Secondary | ICD-10-CM | POA: Diagnosis not present

## 2023-08-17 DIAGNOSIS — D229 Melanocytic nevi, unspecified: Secondary | ICD-10-CM | POA: Diagnosis not present

## 2023-08-17 DIAGNOSIS — Z08 Encounter for follow-up examination after completed treatment for malignant neoplasm: Secondary | ICD-10-CM | POA: Diagnosis not present

## 2023-08-17 DIAGNOSIS — Z7189 Other specified counseling: Secondary | ICD-10-CM | POA: Diagnosis not present

## 2023-08-17 DIAGNOSIS — Z85828 Personal history of other malignant neoplasm of skin: Secondary | ICD-10-CM | POA: Diagnosis not present

## 2023-08-17 DIAGNOSIS — D485 Neoplasm of uncertain behavior of skin: Secondary | ICD-10-CM | POA: Diagnosis not present

## 2023-08-17 DIAGNOSIS — C44329 Squamous cell carcinoma of skin of other parts of face: Secondary | ICD-10-CM | POA: Diagnosis not present

## 2023-08-17 DIAGNOSIS — L308 Other specified dermatitis: Secondary | ICD-10-CM | POA: Diagnosis not present

## 2023-08-17 DIAGNOSIS — L570 Actinic keratosis: Secondary | ICD-10-CM | POA: Diagnosis not present

## 2023-08-29 DIAGNOSIS — C44329 Squamous cell carcinoma of skin of other parts of face: Secondary | ICD-10-CM | POA: Diagnosis not present

## 2023-08-29 DIAGNOSIS — L570 Actinic keratosis: Secondary | ICD-10-CM | POA: Diagnosis not present

## 2023-09-14 DIAGNOSIS — R0602 Shortness of breath: Secondary | ICD-10-CM | POA: Diagnosis not present

## 2023-09-14 DIAGNOSIS — R600 Localized edema: Secondary | ICD-10-CM | POA: Diagnosis not present

## 2023-09-14 DIAGNOSIS — R059 Cough, unspecified: Secondary | ICD-10-CM | POA: Diagnosis not present

## 2023-09-20 NOTE — Progress Notes (Unsigned)
  Cardiology Office Note:   Date:  09/21/2023  ID:  Adam Richards, DOB December 05, 1926, MRN [id number] PCP: Jearldine Mina, MD  [facility] Providers Cardiologist:  Eilleen Grates, MD {  History of Present Illness:   Adam Richards is a 89 y.o. male  who presents for evaluation of atrial fibrillation. He moved here from [place] . He has a long-standing history of permanent atrial fibrillation. He's been on anticoagulation for years. He had a mildly decreased LV function and moderately increased pulmonary pressures.  I did repeat an echocardiogram in 2022 which suggested that his EF was low normal 50 to 55%.  He had moderately severe tricuspid regurgitation with moderately elevated pulmonary pressures.     He presents for follow up.  He has done well since I last saw him. The patient denies any new symptoms such as chest discomfort, neck or arm discomfort. There has been no new shortness of breath, PND or orthopnea. There have been no reported palpitations, presyncope or syncope.  He did have a recent upper respiratory infection and is recovering from this.  He did not need to be hospitalized.  ROS: As stated in the HPI and negative for all other systems.  Studies Reviewed:    EKG:   EKG Interpretation Date/Time:  Wednesday September 21 2023 11:52:17 EDT Ventricular Rate:  87 PR Interval:    QRS Duration:  96 QT Interval:  380 QTC Calculation: 457 R Axis:   20  Text Interpretation: Atrial fibrillation with premature ventricular or aberrantly conducted complexes Incomplete right bundle branch block Possible Anterior infarct , age undetermined When compared with ECG of 13-Dec-2021 17:24, No significant change since last tracing Confirmed by Eilleen Grates ([id number]) on 09/21/2023 11:58:31 AM     Risk Assessment/Calculations:    CHA2DS2-VASc Score = 3   This indicates a 3.2% annual risk of stroke. The patient's score is based upon: CHF History: 0 HTN History: 1 Diabetes History: 0 Stroke  History: 0 Vascular Disease History: 0 Age Score: 2 Gender Score: 0   Physical Exam:   VS:  BP (!) 156/86   Ht 5\' 10"  (1.778 m)   BMI 27.06 kg/m    Wt Readings from Last 3 Encounters:  09/16/22 188 lb 9.6 oz (85.5 kg)  12/14/21 186 lb 4.6 oz (84.5 kg)  08/25/21 187 lb (84.8 kg)     GEN: Well nourished, well developed in no acute distress NECK: Positive JVD; No carotid bruits CARDIAC: Irregular RR, 2 out of 6 apical brief systolic murmur, no diastolic murmurs, rubs, gallops RESPIRATORY:  Clear to auscultation without rales, wheezing or rhonchi  ABDOMEN: Soft, non-tender, non-distended EXTREMITIES:  No edema; No deformity   ASSESSMENT AND PLAN:   ATRIAL FIB: He tolerates this.  He is can send me his labs that are pending tomorrow.  I think he is on the appropriate dose of medication and has had no contraindications.    HTN: Blood pressure is at target at home although slightly elevated today.  He will keep a blood pressure diary.  I did review recent readings and they were all within acceptable limits.  No change in therapy at this time.   PULMONARY HTN:    We are managing this conservatively.  No change in therapy.        Follow up with me in 1 year  Signed, Eilleen Grates, MD

## 2023-09-21 ENCOUNTER — Ambulatory Visit: Payer: Medicare Other | Attending: Cardiology | Admitting: Cardiology

## 2023-09-21 ENCOUNTER — Encounter: Payer: Self-pay | Admitting: Cardiology

## 2023-09-21 VITALS — BP 156/86 | HR 93 | Ht 70.0 in | Wt 179.0 lb

## 2023-09-21 DIAGNOSIS — Z7901 Long term (current) use of anticoagulants: Secondary | ICD-10-CM | POA: Insufficient documentation

## 2023-09-21 DIAGNOSIS — E7800 Pure hypercholesterolemia, unspecified: Secondary | ICD-10-CM | POA: Diagnosis not present

## 2023-09-21 DIAGNOSIS — I482 Chronic atrial fibrillation, unspecified: Secondary | ICD-10-CM | POA: Diagnosis not present

## 2023-09-21 DIAGNOSIS — I4891 Unspecified atrial fibrillation: Secondary | ICD-10-CM | POA: Diagnosis not present

## 2023-09-21 DIAGNOSIS — I272 Pulmonary hypertension, unspecified: Secondary | ICD-10-CM | POA: Insufficient documentation

## 2023-09-21 DIAGNOSIS — I10 Essential (primary) hypertension: Secondary | ICD-10-CM | POA: Diagnosis not present

## 2023-09-21 DIAGNOSIS — N182 Chronic kidney disease, stage 2 (mild): Secondary | ICD-10-CM | POA: Diagnosis not present

## 2023-09-21 NOTE — Patient Instructions (Signed)
 Follow-Up: At [facility], you and your health needs are our priority.  As part of our continuing mission to provide you with exceptional heart care, our providers are all part of one team.  This team includes your primary Cardiologist (physician) and Advanced Practice Providers or APPs (Physician Assistants and Nurse Practitioners) who all work together to provide you with the care you need, when you need it.  Your next appointment:   1 year(s)  Provider:   Eilleen Grates, MD

## 2023-09-22 DIAGNOSIS — D6869 Other thrombophilia: Secondary | ICD-10-CM | POA: Diagnosis not present

## 2023-09-22 DIAGNOSIS — I4891 Unspecified atrial fibrillation: Secondary | ICD-10-CM | POA: Diagnosis not present

## 2023-09-22 DIAGNOSIS — I10 Essential (primary) hypertension: Secondary | ICD-10-CM | POA: Diagnosis not present

## 2023-09-22 DIAGNOSIS — N182 Chronic kidney disease, stage 2 (mild): Secondary | ICD-10-CM | POA: Diagnosis not present

## 2023-09-22 DIAGNOSIS — Q6100 Congenital renal cyst, unspecified: Secondary | ICD-10-CM | POA: Diagnosis not present

## 2023-09-22 DIAGNOSIS — I270 Primary pulmonary hypertension: Secondary | ICD-10-CM | POA: Diagnosis not present

## 2023-09-22 DIAGNOSIS — Z1331 Encounter for screening for depression: Secondary | ICD-10-CM | POA: Diagnosis not present

## 2023-09-22 DIAGNOSIS — N400 Enlarged prostate without lower urinary tract symptoms: Secondary | ICD-10-CM | POA: Diagnosis not present

## 2023-09-22 DIAGNOSIS — I700 Atherosclerosis of aorta: Secondary | ICD-10-CM | POA: Diagnosis not present

## 2023-09-22 DIAGNOSIS — Z0000 Encounter for general adult medical examination without abnormal findings: Secondary | ICD-10-CM | POA: Diagnosis not present

## 2023-09-22 DIAGNOSIS — E7800 Pure hypercholesterolemia, unspecified: Secondary | ICD-10-CM | POA: Diagnosis not present

## 2023-09-22 DIAGNOSIS — J189 Pneumonia, unspecified organism: Secondary | ICD-10-CM | POA: Diagnosis not present

## 2023-09-22 LAB — LAB REPORT - SCANNED: EGFR: 75

## 2023-09-26 ENCOUNTER — Encounter: Payer: Self-pay | Admitting: Cardiology

## 2023-09-26 DIAGNOSIS — M6281 Muscle weakness (generalized): Secondary | ICD-10-CM | POA: Diagnosis not present

## 2023-09-26 DIAGNOSIS — R2689 Other abnormalities of gait and mobility: Secondary | ICD-10-CM | POA: Diagnosis not present

## 2023-09-27 DIAGNOSIS — R2689 Other abnormalities of gait and mobility: Secondary | ICD-10-CM | POA: Diagnosis not present

## 2023-09-27 DIAGNOSIS — M6281 Muscle weakness (generalized): Secondary | ICD-10-CM | POA: Diagnosis not present

## 2023-10-04 DIAGNOSIS — R2689 Other abnormalities of gait and mobility: Secondary | ICD-10-CM | POA: Diagnosis not present

## 2023-10-04 DIAGNOSIS — M6281 Muscle weakness (generalized): Secondary | ICD-10-CM | POA: Diagnosis not present

## 2023-10-06 DIAGNOSIS — M6281 Muscle weakness (generalized): Secondary | ICD-10-CM | POA: Diagnosis not present

## 2023-10-06 DIAGNOSIS — R2689 Other abnormalities of gait and mobility: Secondary | ICD-10-CM | POA: Diagnosis not present

## 2023-10-07 DIAGNOSIS — J189 Pneumonia, unspecified organism: Secondary | ICD-10-CM | POA: Diagnosis not present

## 2023-10-09 DIAGNOSIS — R2689 Other abnormalities of gait and mobility: Secondary | ICD-10-CM | POA: Diagnosis not present

## 2023-10-09 DIAGNOSIS — M6281 Muscle weakness (generalized): Secondary | ICD-10-CM | POA: Diagnosis not present

## 2023-10-18 DIAGNOSIS — R2689 Other abnormalities of gait and mobility: Secondary | ICD-10-CM | POA: Diagnosis not present

## 2023-10-18 DIAGNOSIS — M6281 Muscle weakness (generalized): Secondary | ICD-10-CM | POA: Diagnosis not present

## 2023-10-21 DIAGNOSIS — R2689 Other abnormalities of gait and mobility: Secondary | ICD-10-CM | POA: Diagnosis not present

## 2023-10-21 DIAGNOSIS — M6281 Muscle weakness (generalized): Secondary | ICD-10-CM | POA: Diagnosis not present

## 2023-10-22 DIAGNOSIS — N182 Chronic kidney disease, stage 2 (mild): Secondary | ICD-10-CM | POA: Diagnosis not present

## 2023-10-22 DIAGNOSIS — I4891 Unspecified atrial fibrillation: Secondary | ICD-10-CM | POA: Diagnosis not present

## 2023-10-22 DIAGNOSIS — E7800 Pure hypercholesterolemia, unspecified: Secondary | ICD-10-CM | POA: Diagnosis not present

## 2023-10-22 DIAGNOSIS — I10 Essential (primary) hypertension: Secondary | ICD-10-CM | POA: Diagnosis not present

## 2023-10-25 DIAGNOSIS — M6281 Muscle weakness (generalized): Secondary | ICD-10-CM | POA: Diagnosis not present

## 2023-10-25 DIAGNOSIS — R2689 Other abnormalities of gait and mobility: Secondary | ICD-10-CM | POA: Diagnosis not present

## 2023-10-27 DIAGNOSIS — M6281 Muscle weakness (generalized): Secondary | ICD-10-CM | POA: Diagnosis not present

## 2023-10-27 DIAGNOSIS — R2689 Other abnormalities of gait and mobility: Secondary | ICD-10-CM | POA: Diagnosis not present

## 2023-11-03 DIAGNOSIS — R2689 Other abnormalities of gait and mobility: Secondary | ICD-10-CM | POA: Diagnosis not present

## 2023-11-03 DIAGNOSIS — M6281 Muscle weakness (generalized): Secondary | ICD-10-CM | POA: Diagnosis not present

## 2023-11-08 DIAGNOSIS — R2689 Other abnormalities of gait and mobility: Secondary | ICD-10-CM | POA: Diagnosis not present

## 2023-11-08 DIAGNOSIS — M6281 Muscle weakness (generalized): Secondary | ICD-10-CM | POA: Diagnosis not present

## 2023-11-10 DIAGNOSIS — M6281 Muscle weakness (generalized): Secondary | ICD-10-CM | POA: Diagnosis not present

## 2023-11-10 DIAGNOSIS — R2689 Other abnormalities of gait and mobility: Secondary | ICD-10-CM | POA: Diagnosis not present

## 2023-11-15 DIAGNOSIS — M6281 Muscle weakness (generalized): Secondary | ICD-10-CM | POA: Diagnosis not present

## 2023-11-15 DIAGNOSIS — R2689 Other abnormalities of gait and mobility: Secondary | ICD-10-CM | POA: Diagnosis not present

## 2023-11-17 DIAGNOSIS — M6281 Muscle weakness (generalized): Secondary | ICD-10-CM | POA: Diagnosis not present

## 2023-11-17 DIAGNOSIS — R2689 Other abnormalities of gait and mobility: Secondary | ICD-10-CM | POA: Diagnosis not present

## 2023-11-21 DIAGNOSIS — N182 Chronic kidney disease, stage 2 (mild): Secondary | ICD-10-CM | POA: Diagnosis not present

## 2023-11-21 DIAGNOSIS — I10 Essential (primary) hypertension: Secondary | ICD-10-CM | POA: Diagnosis not present

## 2023-11-21 DIAGNOSIS — E7800 Pure hypercholesterolemia, unspecified: Secondary | ICD-10-CM | POA: Diagnosis not present

## 2023-11-21 DIAGNOSIS — I4891 Unspecified atrial fibrillation: Secondary | ICD-10-CM | POA: Diagnosis not present

## 2023-11-22 DIAGNOSIS — M6281 Muscle weakness (generalized): Secondary | ICD-10-CM | POA: Diagnosis not present

## 2023-11-22 DIAGNOSIS — R2689 Other abnormalities of gait and mobility: Secondary | ICD-10-CM | POA: Diagnosis not present

## 2023-11-24 DIAGNOSIS — M6281 Muscle weakness (generalized): Secondary | ICD-10-CM | POA: Diagnosis not present

## 2023-11-24 DIAGNOSIS — R2689 Other abnormalities of gait and mobility: Secondary | ICD-10-CM | POA: Diagnosis not present

## 2023-11-29 DIAGNOSIS — M6281 Muscle weakness (generalized): Secondary | ICD-10-CM | POA: Diagnosis not present

## 2023-11-29 DIAGNOSIS — R2689 Other abnormalities of gait and mobility: Secondary | ICD-10-CM | POA: Diagnosis not present

## 2023-12-01 DIAGNOSIS — M6281 Muscle weakness (generalized): Secondary | ICD-10-CM | POA: Diagnosis not present

## 2023-12-01 DIAGNOSIS — R2689 Other abnormalities of gait and mobility: Secondary | ICD-10-CM | POA: Diagnosis not present

## 2023-12-06 DIAGNOSIS — R2689 Other abnormalities of gait and mobility: Secondary | ICD-10-CM | POA: Diagnosis not present

## 2023-12-06 DIAGNOSIS — M6281 Muscle weakness (generalized): Secondary | ICD-10-CM | POA: Diagnosis not present

## 2023-12-07 DIAGNOSIS — D044 Carcinoma in situ of skin of scalp and neck: Secondary | ICD-10-CM | POA: Diagnosis not present

## 2023-12-07 DIAGNOSIS — Z85828 Personal history of other malignant neoplasm of skin: Secondary | ICD-10-CM | POA: Diagnosis not present

## 2023-12-07 DIAGNOSIS — D485 Neoplasm of uncertain behavior of skin: Secondary | ICD-10-CM | POA: Diagnosis not present

## 2023-12-07 DIAGNOSIS — Z08 Encounter for follow-up examination after completed treatment for malignant neoplasm: Secondary | ICD-10-CM | POA: Diagnosis not present

## 2023-12-07 DIAGNOSIS — L570 Actinic keratosis: Secondary | ICD-10-CM | POA: Diagnosis not present

## 2023-12-07 DIAGNOSIS — Z86007 Personal history of in-situ neoplasm of skin: Secondary | ICD-10-CM | POA: Diagnosis not present

## 2023-12-07 DIAGNOSIS — D0422 Carcinoma in situ of skin of left ear and external auricular canal: Secondary | ICD-10-CM | POA: Diagnosis not present

## 2023-12-07 DIAGNOSIS — L821 Other seborrheic keratosis: Secondary | ICD-10-CM | POA: Diagnosis not present

## 2023-12-07 DIAGNOSIS — D229 Melanocytic nevi, unspecified: Secondary | ICD-10-CM | POA: Diagnosis not present

## 2023-12-07 DIAGNOSIS — Z7189 Other specified counseling: Secondary | ICD-10-CM | POA: Diagnosis not present

## 2023-12-08 DIAGNOSIS — R2689 Other abnormalities of gait and mobility: Secondary | ICD-10-CM | POA: Diagnosis not present

## 2023-12-08 DIAGNOSIS — M6281 Muscle weakness (generalized): Secondary | ICD-10-CM | POA: Diagnosis not present

## 2023-12-13 DIAGNOSIS — M6281 Muscle weakness (generalized): Secondary | ICD-10-CM | POA: Diagnosis not present

## 2023-12-13 DIAGNOSIS — R2689 Other abnormalities of gait and mobility: Secondary | ICD-10-CM | POA: Diagnosis not present

## 2023-12-20 DIAGNOSIS — R2689 Other abnormalities of gait and mobility: Secondary | ICD-10-CM | POA: Diagnosis not present

## 2023-12-20 DIAGNOSIS — M6281 Muscle weakness (generalized): Secondary | ICD-10-CM | POA: Diagnosis not present

## 2023-12-22 DIAGNOSIS — R2689 Other abnormalities of gait and mobility: Secondary | ICD-10-CM | POA: Diagnosis not present

## 2023-12-22 DIAGNOSIS — N182 Chronic kidney disease, stage 2 (mild): Secondary | ICD-10-CM | POA: Diagnosis not present

## 2023-12-22 DIAGNOSIS — I4891 Unspecified atrial fibrillation: Secondary | ICD-10-CM | POA: Diagnosis not present

## 2023-12-22 DIAGNOSIS — I10 Essential (primary) hypertension: Secondary | ICD-10-CM | POA: Diagnosis not present

## 2023-12-22 DIAGNOSIS — E7800 Pure hypercholesterolemia, unspecified: Secondary | ICD-10-CM | POA: Diagnosis not present

## 2023-12-22 DIAGNOSIS — M6281 Muscle weakness (generalized): Secondary | ICD-10-CM | POA: Diagnosis not present

## 2023-12-26 DIAGNOSIS — N182 Chronic kidney disease, stage 2 (mild): Secondary | ICD-10-CM | POA: Diagnosis not present

## 2023-12-26 DIAGNOSIS — I270 Primary pulmonary hypertension: Secondary | ICD-10-CM | POA: Diagnosis not present

## 2023-12-26 DIAGNOSIS — I4891 Unspecified atrial fibrillation: Secondary | ICD-10-CM | POA: Diagnosis not present

## 2023-12-26 DIAGNOSIS — E7800 Pure hypercholesterolemia, unspecified: Secondary | ICD-10-CM | POA: Diagnosis not present

## 2023-12-26 DIAGNOSIS — I10 Essential (primary) hypertension: Secondary | ICD-10-CM | POA: Diagnosis not present

## 2023-12-26 DIAGNOSIS — N400 Enlarged prostate without lower urinary tract symptoms: Secondary | ICD-10-CM | POA: Diagnosis not present

## 2023-12-28 DIAGNOSIS — M6281 Muscle weakness (generalized): Secondary | ICD-10-CM | POA: Diagnosis not present

## 2023-12-28 DIAGNOSIS — R2689 Other abnormalities of gait and mobility: Secondary | ICD-10-CM | POA: Diagnosis not present

## 2023-12-29 DIAGNOSIS — R2689 Other abnormalities of gait and mobility: Secondary | ICD-10-CM | POA: Diagnosis not present

## 2023-12-29 DIAGNOSIS — M6281 Muscle weakness (generalized): Secondary | ICD-10-CM | POA: Diagnosis not present

## 2024-01-03 DIAGNOSIS — M6281 Muscle weakness (generalized): Secondary | ICD-10-CM | POA: Diagnosis not present

## 2024-01-03 DIAGNOSIS — R2689 Other abnormalities of gait and mobility: Secondary | ICD-10-CM | POA: Diagnosis not present

## 2024-01-03 DIAGNOSIS — D044 Carcinoma in situ of skin of scalp and neck: Secondary | ICD-10-CM | POA: Diagnosis not present

## 2024-01-05 DIAGNOSIS — M6281 Muscle weakness (generalized): Secondary | ICD-10-CM | POA: Diagnosis not present

## 2024-01-05 DIAGNOSIS — R2689 Other abnormalities of gait and mobility: Secondary | ICD-10-CM | POA: Diagnosis not present

## 2024-01-10 DIAGNOSIS — M6281 Muscle weakness (generalized): Secondary | ICD-10-CM | POA: Diagnosis not present

## 2024-01-10 DIAGNOSIS — R2689 Other abnormalities of gait and mobility: Secondary | ICD-10-CM | POA: Diagnosis not present

## 2024-01-12 DIAGNOSIS — M6281 Muscle weakness (generalized): Secondary | ICD-10-CM | POA: Diagnosis not present

## 2024-01-12 DIAGNOSIS — R2689 Other abnormalities of gait and mobility: Secondary | ICD-10-CM | POA: Diagnosis not present

## 2024-01-17 DIAGNOSIS — R2689 Other abnormalities of gait and mobility: Secondary | ICD-10-CM | POA: Diagnosis not present

## 2024-01-17 DIAGNOSIS — M6281 Muscle weakness (generalized): Secondary | ICD-10-CM | POA: Diagnosis not present

## 2024-01-19 DIAGNOSIS — M6281 Muscle weakness (generalized): Secondary | ICD-10-CM | POA: Diagnosis not present

## 2024-01-19 DIAGNOSIS — R2689 Other abnormalities of gait and mobility: Secondary | ICD-10-CM | POA: Diagnosis not present

## 2024-01-22 DIAGNOSIS — I10 Essential (primary) hypertension: Secondary | ICD-10-CM | POA: Diagnosis not present

## 2024-01-22 DIAGNOSIS — N182 Chronic kidney disease, stage 2 (mild): Secondary | ICD-10-CM | POA: Diagnosis not present

## 2024-01-22 DIAGNOSIS — E7800 Pure hypercholesterolemia, unspecified: Secondary | ICD-10-CM | POA: Diagnosis not present

## 2024-01-22 DIAGNOSIS — I4891 Unspecified atrial fibrillation: Secondary | ICD-10-CM | POA: Diagnosis not present

## 2024-01-24 DIAGNOSIS — R2689 Other abnormalities of gait and mobility: Secondary | ICD-10-CM | POA: Diagnosis not present

## 2024-01-24 DIAGNOSIS — M6281 Muscle weakness (generalized): Secondary | ICD-10-CM | POA: Diagnosis not present

## 2024-01-26 DIAGNOSIS — R2689 Other abnormalities of gait and mobility: Secondary | ICD-10-CM | POA: Diagnosis not present

## 2024-01-26 DIAGNOSIS — M6281 Muscle weakness (generalized): Secondary | ICD-10-CM | POA: Diagnosis not present

## 2024-01-31 DIAGNOSIS — R2689 Other abnormalities of gait and mobility: Secondary | ICD-10-CM | POA: Diagnosis not present

## 2024-01-31 DIAGNOSIS — M6281 Muscle weakness (generalized): Secondary | ICD-10-CM | POA: Diagnosis not present

## 2024-02-01 DIAGNOSIS — D044 Carcinoma in situ of skin of scalp and neck: Secondary | ICD-10-CM | POA: Diagnosis not present

## 2024-02-01 DIAGNOSIS — D485 Neoplasm of uncertain behavior of skin: Secondary | ICD-10-CM | POA: Diagnosis not present

## 2024-02-02 DIAGNOSIS — M6281 Muscle weakness (generalized): Secondary | ICD-10-CM | POA: Diagnosis not present

## 2024-02-02 DIAGNOSIS — R2689 Other abnormalities of gait and mobility: Secondary | ICD-10-CM | POA: Diagnosis not present

## 2024-02-07 DIAGNOSIS — R2689 Other abnormalities of gait and mobility: Secondary | ICD-10-CM | POA: Diagnosis not present

## 2024-02-07 DIAGNOSIS — M6281 Muscle weakness (generalized): Secondary | ICD-10-CM | POA: Diagnosis not present

## 2024-02-09 DIAGNOSIS — M6281 Muscle weakness (generalized): Secondary | ICD-10-CM | POA: Diagnosis not present

## 2024-02-09 DIAGNOSIS — R2689 Other abnormalities of gait and mobility: Secondary | ICD-10-CM | POA: Diagnosis not present

## 2024-02-14 DIAGNOSIS — R2689 Other abnormalities of gait and mobility: Secondary | ICD-10-CM | POA: Diagnosis not present

## 2024-02-14 DIAGNOSIS — M6281 Muscle weakness (generalized): Secondary | ICD-10-CM | POA: Diagnosis not present

## 2024-02-16 DIAGNOSIS — R2689 Other abnormalities of gait and mobility: Secondary | ICD-10-CM | POA: Diagnosis not present

## 2024-02-16 DIAGNOSIS — M6281 Muscle weakness (generalized): Secondary | ICD-10-CM | POA: Diagnosis not present

## 2024-02-21 DIAGNOSIS — I10 Essential (primary) hypertension: Secondary | ICD-10-CM | POA: Diagnosis not present

## 2024-02-21 DIAGNOSIS — I4891 Unspecified atrial fibrillation: Secondary | ICD-10-CM | POA: Diagnosis not present

## 2024-02-21 DIAGNOSIS — M6281 Muscle weakness (generalized): Secondary | ICD-10-CM | POA: Diagnosis not present

## 2024-02-21 DIAGNOSIS — R2689 Other abnormalities of gait and mobility: Secondary | ICD-10-CM | POA: Diagnosis not present

## 2024-02-21 DIAGNOSIS — N182 Chronic kidney disease, stage 2 (mild): Secondary | ICD-10-CM | POA: Diagnosis not present

## 2024-02-21 DIAGNOSIS — E7800 Pure hypercholesterolemia, unspecified: Secondary | ICD-10-CM | POA: Diagnosis not present

## 2024-02-23 DIAGNOSIS — R2689 Other abnormalities of gait and mobility: Secondary | ICD-10-CM | POA: Diagnosis not present

## 2024-02-23 DIAGNOSIS — M6281 Muscle weakness (generalized): Secondary | ICD-10-CM | POA: Diagnosis not present

## 2024-02-27 DIAGNOSIS — D044 Carcinoma in situ of skin of scalp and neck: Secondary | ICD-10-CM | POA: Diagnosis not present

## 2024-02-28 DIAGNOSIS — R2689 Other abnormalities of gait and mobility: Secondary | ICD-10-CM | POA: Diagnosis not present

## 2024-02-28 DIAGNOSIS — M6281 Muscle weakness (generalized): Secondary | ICD-10-CM | POA: Diagnosis not present

## 2024-03-01 DIAGNOSIS — R2689 Other abnormalities of gait and mobility: Secondary | ICD-10-CM | POA: Diagnosis not present

## 2024-03-01 DIAGNOSIS — M6281 Muscle weakness (generalized): Secondary | ICD-10-CM | POA: Diagnosis not present

## 2024-03-06 DIAGNOSIS — R2689 Other abnormalities of gait and mobility: Secondary | ICD-10-CM | POA: Diagnosis not present

## 2024-03-06 DIAGNOSIS — M6281 Muscle weakness (generalized): Secondary | ICD-10-CM | POA: Diagnosis not present

## 2024-03-08 DIAGNOSIS — M6281 Muscle weakness (generalized): Secondary | ICD-10-CM | POA: Diagnosis not present

## 2024-03-08 DIAGNOSIS — R2689 Other abnormalities of gait and mobility: Secondary | ICD-10-CM | POA: Diagnosis not present

## 2024-03-13 DIAGNOSIS — R2689 Other abnormalities of gait and mobility: Secondary | ICD-10-CM | POA: Diagnosis not present

## 2024-03-13 DIAGNOSIS — M6281 Muscle weakness (generalized): Secondary | ICD-10-CM | POA: Diagnosis not present

## 2024-03-20 DIAGNOSIS — M6281 Muscle weakness (generalized): Secondary | ICD-10-CM | POA: Diagnosis not present

## 2024-03-20 DIAGNOSIS — R2689 Other abnormalities of gait and mobility: Secondary | ICD-10-CM | POA: Diagnosis not present

## 2024-03-22 DIAGNOSIS — M6281 Muscle weakness (generalized): Secondary | ICD-10-CM | POA: Diagnosis not present

## 2024-03-22 DIAGNOSIS — R2689 Other abnormalities of gait and mobility: Secondary | ICD-10-CM | POA: Diagnosis not present

## 2024-03-23 DIAGNOSIS — I10 Essential (primary) hypertension: Secondary | ICD-10-CM | POA: Diagnosis not present

## 2024-03-23 DIAGNOSIS — E7800 Pure hypercholesterolemia, unspecified: Secondary | ICD-10-CM | POA: Diagnosis not present

## 2024-03-23 DIAGNOSIS — N182 Chronic kidney disease, stage 2 (mild): Secondary | ICD-10-CM | POA: Diagnosis not present

## 2024-03-23 DIAGNOSIS — I4891 Unspecified atrial fibrillation: Secondary | ICD-10-CM | POA: Diagnosis not present

## 2024-03-26 DIAGNOSIS — Z7189 Other specified counseling: Secondary | ICD-10-CM | POA: Diagnosis not present

## 2024-03-26 DIAGNOSIS — L821 Other seborrheic keratosis: Secondary | ICD-10-CM | POA: Diagnosis not present

## 2024-03-26 DIAGNOSIS — L570 Actinic keratosis: Secondary | ICD-10-CM | POA: Diagnosis not present

## 2024-03-26 DIAGNOSIS — Z85828 Personal history of other malignant neoplasm of skin: Secondary | ICD-10-CM | POA: Diagnosis not present

## 2024-03-26 DIAGNOSIS — Z08 Encounter for follow-up examination after completed treatment for malignant neoplasm: Secondary | ICD-10-CM | POA: Diagnosis not present

## 2024-03-26 DIAGNOSIS — D1801 Hemangioma of skin and subcutaneous tissue: Secondary | ICD-10-CM | POA: Diagnosis not present

## 2024-03-27 DIAGNOSIS — M6281 Muscle weakness (generalized): Secondary | ICD-10-CM | POA: Diagnosis not present

## 2024-03-27 DIAGNOSIS — R2689 Other abnormalities of gait and mobility: Secondary | ICD-10-CM | POA: Diagnosis not present

## 2024-03-29 DIAGNOSIS — M6281 Muscle weakness (generalized): Secondary | ICD-10-CM | POA: Diagnosis not present

## 2024-03-29 DIAGNOSIS — R2689 Other abnormalities of gait and mobility: Secondary | ICD-10-CM | POA: Diagnosis not present

## 2024-04-03 DIAGNOSIS — M6281 Muscle weakness (generalized): Secondary | ICD-10-CM | POA: Diagnosis not present

## 2024-04-03 DIAGNOSIS — R2689 Other abnormalities of gait and mobility: Secondary | ICD-10-CM | POA: Diagnosis not present

## 2024-04-10 DIAGNOSIS — M6281 Muscle weakness (generalized): Secondary | ICD-10-CM | POA: Diagnosis not present

## 2024-04-10 DIAGNOSIS — R2689 Other abnormalities of gait and mobility: Secondary | ICD-10-CM | POA: Diagnosis not present

## 2024-04-12 DIAGNOSIS — R2689 Other abnormalities of gait and mobility: Secondary | ICD-10-CM | POA: Diagnosis not present

## 2024-04-12 DIAGNOSIS — M6281 Muscle weakness (generalized): Secondary | ICD-10-CM | POA: Diagnosis not present

## 2024-04-17 DIAGNOSIS — R2689 Other abnormalities of gait and mobility: Secondary | ICD-10-CM | POA: Diagnosis not present

## 2024-04-17 DIAGNOSIS — M6281 Muscle weakness (generalized): Secondary | ICD-10-CM | POA: Diagnosis not present

## 2024-04-22 DIAGNOSIS — I10 Essential (primary) hypertension: Secondary | ICD-10-CM | POA: Diagnosis not present

## 2024-04-22 DIAGNOSIS — E7800 Pure hypercholesterolemia, unspecified: Secondary | ICD-10-CM | POA: Diagnosis not present

## 2024-04-22 DIAGNOSIS — N182 Chronic kidney disease, stage 2 (mild): Secondary | ICD-10-CM | POA: Diagnosis not present

## 2024-04-22 DIAGNOSIS — I4891 Unspecified atrial fibrillation: Secondary | ICD-10-CM | POA: Diagnosis not present

## 2024-04-24 DIAGNOSIS — R2689 Other abnormalities of gait and mobility: Secondary | ICD-10-CM | POA: Diagnosis not present

## 2024-04-24 DIAGNOSIS — M6281 Muscle weakness (generalized): Secondary | ICD-10-CM | POA: Diagnosis not present

## 2024-04-26 DIAGNOSIS — I4891 Unspecified atrial fibrillation: Secondary | ICD-10-CM | POA: Diagnosis not present

## 2024-04-26 DIAGNOSIS — N281 Cyst of kidney, acquired: Secondary | ICD-10-CM | POA: Diagnosis not present

## 2024-04-26 DIAGNOSIS — N182 Chronic kidney disease, stage 2 (mild): Secondary | ICD-10-CM | POA: Diagnosis not present

## 2024-04-26 DIAGNOSIS — I270 Primary pulmonary hypertension: Secondary | ICD-10-CM | POA: Diagnosis not present

## 2024-04-26 DIAGNOSIS — D6869 Other thrombophilia: Secondary | ICD-10-CM | POA: Diagnosis not present

## 2024-04-26 DIAGNOSIS — R2689 Other abnormalities of gait and mobility: Secondary | ICD-10-CM | POA: Diagnosis not present

## 2024-04-26 DIAGNOSIS — E7800 Pure hypercholesterolemia, unspecified: Secondary | ICD-10-CM | POA: Diagnosis not present

## 2024-04-26 DIAGNOSIS — M6281 Muscle weakness (generalized): Secondary | ICD-10-CM | POA: Diagnosis not present

## 2024-04-26 DIAGNOSIS — I10 Essential (primary) hypertension: Secondary | ICD-10-CM | POA: Diagnosis not present

## 2024-05-01 DIAGNOSIS — R2689 Other abnormalities of gait and mobility: Secondary | ICD-10-CM | POA: Diagnosis not present

## 2024-05-01 DIAGNOSIS — M6281 Muscle weakness (generalized): Secondary | ICD-10-CM | POA: Diagnosis not present

## 2024-05-03 DIAGNOSIS — M6281 Muscle weakness (generalized): Secondary | ICD-10-CM | POA: Diagnosis not present

## 2024-05-03 DIAGNOSIS — R2689 Other abnormalities of gait and mobility: Secondary | ICD-10-CM | POA: Diagnosis not present

## 2024-05-08 DIAGNOSIS — M6281 Muscle weakness (generalized): Secondary | ICD-10-CM | POA: Diagnosis not present

## 2024-05-08 DIAGNOSIS — R2689 Other abnormalities of gait and mobility: Secondary | ICD-10-CM | POA: Diagnosis not present
